# Patient Record
Sex: Male | Born: 1945 | Race: White | Hispanic: No | Marital: Married | Smoking: Never smoker
Health system: Southern US, Community
[De-identification: ages and names within clinical notes are randomized; demographics above are authoritative.]

## PROBLEM LIST (undated history)

## (undated) DIAGNOSIS — H269 Unspecified cataract: Secondary | ICD-10-CM

## (undated) DIAGNOSIS — I351 Nonrheumatic aortic (valve) insufficiency: Secondary | ICD-10-CM

## (undated) DIAGNOSIS — K219 Gastro-esophageal reflux disease without esophagitis: Secondary | ICD-10-CM

## (undated) DIAGNOSIS — M1990 Unspecified osteoarthritis, unspecified site: Secondary | ICD-10-CM

## (undated) DIAGNOSIS — J45909 Unspecified asthma, uncomplicated: Secondary | ICD-10-CM

## (undated) DIAGNOSIS — Z87442 Personal history of urinary calculi: Secondary | ICD-10-CM

## (undated) DIAGNOSIS — I480 Paroxysmal atrial fibrillation: Secondary | ICD-10-CM

## (undated) DIAGNOSIS — I10 Essential (primary) hypertension: Secondary | ICD-10-CM

## (undated) DIAGNOSIS — H6121 Impacted cerumen, right ear: Secondary | ICD-10-CM

## (undated) DIAGNOSIS — I4892 Unspecified atrial flutter: Principal | ICD-10-CM

## (undated) DIAGNOSIS — K5090 Crohn's disease, unspecified, without complications: Secondary | ICD-10-CM

## (undated) DIAGNOSIS — T7840XA Allergy, unspecified, initial encounter: Secondary | ICD-10-CM

## (undated) DIAGNOSIS — I38 Endocarditis, valve unspecified: Secondary | ICD-10-CM

## (undated) DIAGNOSIS — G459 Transient cerebral ischemic attack, unspecified: Secondary | ICD-10-CM

## (undated) DIAGNOSIS — E7800 Pure hypercholesterolemia, unspecified: Secondary | ICD-10-CM

## (undated) DIAGNOSIS — E237 Disorder of pituitary gland, unspecified: Secondary | ICD-10-CM

## (undated) HISTORY — PX: KNEE SURGERY: SHX244

## (undated) HISTORY — DX: Allergy, unspecified, initial encounter: T78.40XA

## (undated) HISTORY — DX: Pure hypercholesterolemia, unspecified: E78.00

## (undated) HISTORY — DX: Essential (primary) hypertension: I10

## (undated) HISTORY — PX: CATARACT EXTRACTION: SUR2

## (undated) HISTORY — DX: Paroxysmal atrial fibrillation: I48.0

## (undated) HISTORY — PX: LITHOTRIPSY: SUR834

## (undated) HISTORY — DX: Unspecified cataract: H26.9

## (undated) HISTORY — PX: TONSILLECTOMY: SUR1361

## (undated) HISTORY — PX: EYE SURGERY: SHX253

## (undated) HISTORY — DX: Crohn's disease, unspecified, without complications: K50.90

## (undated) HISTORY — DX: Impacted cerumen, right ear: H61.21

## (undated) HISTORY — DX: Gastro-esophageal reflux disease without esophagitis: K21.9

---

## 1998-09-17 HISTORY — PX: ROTATOR CUFF REPAIR: SHX139

## 1998-10-18 ENCOUNTER — Ambulatory Visit (HOSPITAL_COMMUNITY): Admission: RE | Admit: 1998-10-18 | Discharge: 1998-10-18 | Payer: Self-pay | Admitting: Emergency Medicine

## 1998-10-19 ENCOUNTER — Encounter: Payer: Self-pay | Admitting: Surgery

## 1998-10-19 ENCOUNTER — Ambulatory Visit (HOSPITAL_BASED_OUTPATIENT_CLINIC_OR_DEPARTMENT_OTHER): Admission: RE | Admit: 1998-10-19 | Discharge: 1998-10-19 | Payer: Self-pay | Admitting: Surgery

## 2002-05-22 ENCOUNTER — Encounter: Payer: Self-pay | Admitting: Urology

## 2002-05-22 ENCOUNTER — Encounter: Admission: RE | Admit: 2002-05-22 | Discharge: 2002-05-22 | Payer: Self-pay | Admitting: Urology

## 2003-09-18 HISTORY — PX: HERNIA REPAIR: SHX51

## 2005-09-17 LAB — HM COLONOSCOPY

## 2006-02-20 ENCOUNTER — Inpatient Hospital Stay (HOSPITAL_COMMUNITY): Admission: AD | Admit: 2006-02-20 | Discharge: 2006-02-20 | Payer: Self-pay | Admitting: Cardiology

## 2007-09-01 ENCOUNTER — Encounter: Admission: RE | Admit: 2007-09-01 | Discharge: 2007-09-01 | Payer: Self-pay | Admitting: Cardiology

## 2008-12-08 ENCOUNTER — Encounter: Admission: RE | Admit: 2008-12-08 | Discharge: 2008-12-08 | Payer: Self-pay | Admitting: Cardiology

## 2008-12-29 ENCOUNTER — Encounter (INDEPENDENT_AMBULATORY_CARE_PROVIDER_SITE_OTHER): Payer: Self-pay | Admitting: Interventional Radiology

## 2008-12-29 ENCOUNTER — Encounter: Admission: RE | Admit: 2008-12-29 | Discharge: 2008-12-29 | Payer: Self-pay | Admitting: Otolaryngology

## 2008-12-29 ENCOUNTER — Other Ambulatory Visit: Admission: RE | Admit: 2008-12-29 | Discharge: 2008-12-29 | Payer: Self-pay | Admitting: Interventional Radiology

## 2009-09-21 ENCOUNTER — Encounter: Admission: RE | Admit: 2009-09-21 | Discharge: 2009-09-21 | Payer: Self-pay | Admitting: Cardiology

## 2009-09-28 ENCOUNTER — Encounter: Admission: RE | Admit: 2009-09-28 | Discharge: 2009-09-28 | Payer: Self-pay | Admitting: Cardiology

## 2009-09-29 ENCOUNTER — Encounter: Admission: RE | Admit: 2009-09-29 | Discharge: 2009-09-29 | Payer: Self-pay | Admitting: Cardiology

## 2009-10-03 ENCOUNTER — Encounter: Admission: RE | Admit: 2009-10-03 | Discharge: 2009-10-03 | Payer: Self-pay | Admitting: Cardiology

## 2010-04-24 ENCOUNTER — Ambulatory Visit: Payer: Self-pay | Admitting: Cardiology

## 2010-05-12 ENCOUNTER — Ambulatory Visit: Payer: Self-pay | Admitting: Cardiology

## 2010-05-25 ENCOUNTER — Ambulatory Visit: Payer: Self-pay | Admitting: Cardiology

## 2010-07-07 ENCOUNTER — Ambulatory Visit: Payer: Self-pay | Admitting: Cardiology

## 2010-07-31 ENCOUNTER — Ambulatory Visit: Payer: Self-pay | Admitting: Cardiology

## 2010-09-07 ENCOUNTER — Ambulatory Visit: Payer: Self-pay | Admitting: Cardiology

## 2010-10-10 ENCOUNTER — Ambulatory Visit: Payer: Self-pay | Admitting: Cardiology

## 2010-10-12 ENCOUNTER — Encounter: Payer: Self-pay | Admitting: Cardiology

## 2010-10-12 DIAGNOSIS — I351 Nonrheumatic aortic (valve) insufficiency: Secondary | ICD-10-CM | POA: Insufficient documentation

## 2010-10-12 DIAGNOSIS — I480 Paroxysmal atrial fibrillation: Secondary | ICD-10-CM | POA: Insufficient documentation

## 2010-10-12 DIAGNOSIS — R011 Cardiac murmur, unspecified: Secondary | ICD-10-CM | POA: Insufficient documentation

## 2010-10-12 DIAGNOSIS — I10 Essential (primary) hypertension: Secondary | ICD-10-CM | POA: Insufficient documentation

## 2010-10-12 DIAGNOSIS — E7800 Pure hypercholesterolemia, unspecified: Secondary | ICD-10-CM | POA: Insufficient documentation

## 2010-11-09 ENCOUNTER — Encounter (INDEPENDENT_AMBULATORY_CARE_PROVIDER_SITE_OTHER): Payer: 59

## 2010-11-09 DIAGNOSIS — Z7901 Long term (current) use of anticoagulants: Secondary | ICD-10-CM

## 2010-11-09 DIAGNOSIS — I4891 Unspecified atrial fibrillation: Secondary | ICD-10-CM

## 2010-12-07 ENCOUNTER — Encounter: Payer: Self-pay | Admitting: Cardiology

## 2010-12-07 ENCOUNTER — Ambulatory Visit (INDEPENDENT_AMBULATORY_CARE_PROVIDER_SITE_OTHER): Payer: 59 | Admitting: Cardiology

## 2010-12-07 DIAGNOSIS — Z79899 Other long term (current) drug therapy: Secondary | ICD-10-CM

## 2010-12-07 DIAGNOSIS — I480 Paroxysmal atrial fibrillation: Secondary | ICD-10-CM

## 2010-12-07 DIAGNOSIS — I10 Essential (primary) hypertension: Secondary | ICD-10-CM

## 2010-12-07 DIAGNOSIS — I4891 Unspecified atrial fibrillation: Secondary | ICD-10-CM

## 2010-12-07 DIAGNOSIS — E7800 Pure hypercholesterolemia, unspecified: Secondary | ICD-10-CM

## 2010-12-07 NOTE — Progress Notes (Signed)
HPI: Had atrial fib 1 month ago, lasted one day.  Had dyspnea with atrial fib. More anxioous recently.  Gettting injections in knee so off warfarin.  Last injection was yesterday.His INR is 1.2 today because he has been off his Coumadin recently.  He will now be resuming his usual dose his weight is up because his activity has been decreased because of his knee problem.  After his series of 5 knee injections his knee has done better.  Current Outpatient Prescriptions  Medication Sig Dispense Refill  . ALPRAZolam (XANAX) 0.25 MG tablet Take 0.25 mg by mouth at bedtime as needed.        [name] aspirin 81 MG tablet Take 81 mg by mouth daily.        . Cholecalciferol (VITAMIN D) 2000 UNIT CAPS Take by mouth daily.        . digoxin (LANOXIN) 0.125 MG tablet Take 125 mcg by mouth daily.        . Glucosamine 500 MG TABS Take 2,000 mg by mouth daily.       [name] losartan-hydrochlorothiazide (HYZAAR) 100-12.5 MG per tablet Take 1 tablet by mouth daily.        [name] lovastatin (MEVACOR) 40 MG tablet Take 40 mg by mouth daily.        . metoprolol (TOPROL-XL) 50 MG 24 hr tablet Take 50 mg by mouth 2 (two) times daily. 1/2 IN THE AM, 1/2 IN THE PM       . MULTIPLE VITAMIN PO Take by mouth daily.        [name] warfarin (COUMADIN) 5 MG tablet Take 5 mg by mouth daily. As directed       . Naproxen Sodium (ALEVE PO) Take by mouth as needed.        . Ranitidine HCl (ZANTAC PO) Take by mouth as needed.          Allergies  Allergen Reactions  . Amiodarone     Abn  thyroid  . Lisinopril Cough  . Tavist Allergy (Clemastine Fumarate)     A fib worse    Patient Active Problem List  Diagnoses  . Paroxysmal atrial fibrillation  . Hypercholesteremia  . Hypertension  . Aortic insufficiency  . Systolic murmur    History  Smoking status  . Never Smoker   Smokeless tobacco  . Not on file    History  Alcohol Use No    History reviewed. No pertinent family history.  Review of Systems: The patient denies any heat or  cold intolerance.  No weight gain or weight loss.  The patient denies headaches or blurry vision.  There is no cough or sputum production.  The patient denies dizziness.  There is no hematuria or hematochezia.  The patient denies any muscle aches or arthritis.  The patient denies any rash.  The patient denies frequent falling or instability.  There is no history of depression or anxiety.  All other systems were reviewed and are negative.   Physical Exam: Vital signs as recorded.Pupils equal and reactive.   Extraocular Movements are full.  There is no scleral icterus.  The mouth and pharynx are normal.  The neck is supple.  The carotids reveal no bruits.  The jugular venous pressure is normal.  The thyroid is not enlarged.  There is no lymphadenopathy.The chest is clear to percussion and auscultation. There are no rales or rhonchi. Expansion of the chest is symmetrical. The precordium is quiet.  The first heart sound is normal.  The second heart sound is physiologically split.  There is no  gallop rub or click.  There is no abnormal lift or heave.There is a soft systolic ejection murmur at the base.The abdomen is soft and nontender. Bowel sounds are normal. The liver and spleen are not enlarged. There Are no abdominal masses. There are no bruits.The pedal pulses are good.  There is no phlebitis or edema.  There is no cyanosis or clubbing.Strength is normal and symmetrical in all extremities.  There is no lateralizing weakness.  There are no sensory deficits.   Assessment / Plan:

## 2010-12-07 NOTE — Assessment & Plan Note (Signed)
The patient is doing well on his present medication.  Continue present dose of losartan HCT and Toprol-XL

## 2010-12-07 NOTE — Assessment & Plan Note (Signed)
Last episode of atrial fibrillation was about a month ago.  He will continue same dose of beta blocker and other meds.

## 2010-12-07 NOTE — Assessment & Plan Note (Signed)
Continued low cholesterol diet.  Needs to work harder on weight loss.

## 2010-12-21 ENCOUNTER — Other Ambulatory Visit: Payer: Self-pay | Admitting: Cardiology

## 2010-12-21 MED ORDER — ALPRAZOLAM 0.25 MG PO TABS: 0.2500 mg | ORAL_TABLET | Freq: Two times a day (BID) | ORAL | Status: DC | PRN

## 2010-12-21 NOTE — Telephone Encounter (Signed)
Needs Alprazolam .25 #60 ( Taking BID now) sent to [facility] [address].

## 2010-12-21 NOTE — Telephone Encounter (Signed)
Patient requested refill

## 2010-12-25 ENCOUNTER — Ambulatory Visit (INDEPENDENT_AMBULATORY_CARE_PROVIDER_SITE_OTHER): Payer: 59 | Admitting: Family Medicine

## 2010-12-25 ENCOUNTER — Encounter: Payer: Self-pay | Admitting: Family Medicine

## 2010-12-25 VITALS — BP 124/74 | HR 72 | Temp 98.0°F | Resp 12 | Ht 72.25 in | Wt 185.0 lb

## 2010-12-25 DIAGNOSIS — Z0000 Encounter for general adult medical examination without abnormal findings: Secondary | ICD-10-CM

## 2010-12-25 LAB — CBC WITH DIFFERENTIAL/PLATELET
Basophils Absolute: 0 10*3/uL (ref 0.0–0.1)
Basophils Relative: 0.5 % (ref 0.0–3.0)
Eosinophils Absolute: 0.1 10*3/uL (ref 0.0–0.7)
Eosinophils Relative: 1.1 % (ref 0.0–5.0)
HCT: 49.9 % (ref 39.0–52.0)
Hemoglobin: 17 g/dL (ref 13.0–17.0)
Lymphocytes Relative: 15.5 % (ref 12.0–46.0)
Lymphs Abs: 1.4 10*3/uL (ref 0.7–4.0)
MCHC: 34.2 g/dL (ref 30.0–36.0)
MCV: 85.1 fl (ref 78.0–100.0)
Monocytes Absolute: 0.7 10*3/uL (ref 0.1–1.0)
Monocytes Relative: 7.4 % (ref 3.0–12.0)
Neutro Abs: 7 10*3/uL (ref 1.4–7.7)
Neutrophils Relative %: 75.5 % (ref 43.0–77.0)
Platelets: 205 10*3/uL (ref 150.0–400.0)
RBC: 5.86 Mil/uL — ABNORMAL HIGH (ref 4.22–5.81)
RDW: 13.8 % (ref 11.5–14.6)
WBC: 9.2 10*3/uL (ref 4.5–10.5)

## 2010-12-25 LAB — HEPATIC FUNCTION PANEL
Albumin: 4.1 g/dL (ref 3.5–5.2)
Alkaline Phosphatase: 71 U/L (ref 39–117)
Total Bilirubin: 0.7 mg/dL (ref 0.3–1.2)

## 2010-12-25 LAB — BASIC METABOLIC PANEL
CO2: 30 mEq/L (ref 19–32)
Calcium: 9.3 mg/dL (ref 8.4–10.5)
Chloride: 98 mEq/L (ref 96–112)
Creatinine, Ser: 0.8 mg/dL (ref 0.4–1.5)
GFR: 100.62 mL/min (ref >60.00)

## 2010-12-25 LAB — PSA: PSA: 1.48 ng/mL (ref 0.10–4.00)

## 2010-12-25 LAB — LIPID PANEL: HDL: 40.6 mg/dL (ref >39.00)

## 2010-12-25 NOTE — Patient Instructions (Signed)
Consider Yearly complete physical exam.

## 2010-12-25 NOTE — Progress Notes (Signed)
  Subjective:    Patient ID: Francis Campbell, male    DOB: 1945/09/19, 65 y.o.   MRN: [id number]  HPI New patient to establish care. Past medical history reviewed. He has history of paroxysmal atrial fibrillation, hyperlipidemia, hypertension, and prior history of kidney stones. He continues to see urologist and cardiologist regularly. He is on Coumadin and is followed in cardiology clinic. Medications reviewed. Surgical hx reviewed and as recorded elsewhere.  Tetanus less than 10 years ago. Pneumovax last year. Received shingles vaccine within the past year as well. Exercises with walking. Previous colonoscopy less than 10 years ago but unsure of exact date.  Family history significant for father  with some type of leukemia. Mother had history of stroke.   Review of Systems  Constitutional: Negative for fever, activity change, appetite change and fatigue.  HENT: Negative for ear pain, congestion and trouble swallowing.   Eyes: Negative for pain and visual disturbance.  Respiratory: Negative for cough, shortness of breath and wheezing.   Cardiovascular: Negative for chest pain and palpitations.  Gastrointestinal: Negative for nausea, vomiting, abdominal pain, diarrhea, constipation, blood in stool, abdominal distention and rectal pain.  Genitourinary: Negative for dysuria, hematuria and testicular pain.  Musculoskeletal: Negative for joint swelling and arthralgias.  Skin: Negative for rash.  Neurological: Negative for dizziness, syncope and headaches.  Hematological: Negative for adenopathy.  Psychiatric/Behavioral: Negative for confusion and dysphoric mood.       Objective:   Physical Exam  Constitutional: He is oriented to person, place, and time. He appears well-developed and well-nourished. No distress.  HENT:  Head: Normocephalic and atraumatic.  Right Ear: External ear normal.  Left Ear: External ear normal.  Mouth/Throat: Oropharynx is clear and moist.  Eyes: Conjunctivae  and EOM are normal. Pupils are equal, round, and reactive to light.  Neck: Normal range of motion. Neck supple. No thyromegaly present.  Cardiovascular: Normal rate, regular rhythm and normal heart sounds.   No murmur heard. Pulmonary/Chest: No respiratory distress. He has no wheezes. He has no rales.  Abdominal: Soft. Bowel sounds are normal. He exhibits no distension and no mass. There is no tenderness. There is no rebound and no guarding.  Genitourinary:       Prostate deferred as patient sees urologist  Musculoskeletal: He exhibits no edema.  Lymphadenopathy:    He has no cervical adenopathy.  Neurological: He is alert and oriented to person, place, and time. He displays normal reflexes. No cranial nerve deficit.  Skin: No rash noted.  Psychiatric: He has a normal mood and affect. His behavior is normal. Judgment and thought content normal.          Assessment & Plan:  #1 health maintenance exam. Immunizations up to date. Will confirm date of last colonoscopy. Obtain screening lab work. He'll continue followup with Coumadin clinic with cardiologist

## 2010-12-27 NOTE — Progress Notes (Signed)
Quick Note:  Pt informed on home VM ______ 

## 2010-12-28 ENCOUNTER — Telehealth: Payer: Self-pay | Admitting: *Deleted

## 2010-12-28 NOTE — Telephone Encounter (Signed)
Pt requested labs be faxed, done, confirmation received

## 2010-12-28 NOTE — Telephone Encounter (Signed)
Per notes, already left on home machine.

## 2010-12-28 NOTE — Telephone Encounter (Signed)
Pt. Needs lab results, please.

## 2011-01-08 ENCOUNTER — Encounter: Payer: 59 | Admitting: *Deleted

## 2011-01-09 ENCOUNTER — Ambulatory Visit (INDEPENDENT_AMBULATORY_CARE_PROVIDER_SITE_OTHER): Payer: 59 | Admitting: *Deleted

## 2011-01-09 DIAGNOSIS — Z7901 Long term (current) use of anticoagulants: Secondary | ICD-10-CM

## 2011-01-09 DIAGNOSIS — I480 Paroxysmal atrial fibrillation: Secondary | ICD-10-CM

## 2011-01-09 DIAGNOSIS — I4891 Unspecified atrial fibrillation: Secondary | ICD-10-CM

## 2011-01-09 LAB — POCT INR: INR: 3.3

## 2011-01-12 ENCOUNTER — Telehealth: Payer: Self-pay | Admitting: Cardiology

## 2011-01-12 ENCOUNTER — Encounter: Payer: Self-pay | Admitting: Family Medicine

## 2011-01-12 ENCOUNTER — Ambulatory Visit (INDEPENDENT_AMBULATORY_CARE_PROVIDER_SITE_OTHER): Payer: 59 | Admitting: Family Medicine

## 2011-01-12 VITALS — BP 130/78 | Temp 98.0°F | Wt 187.0 lb

## 2011-01-12 DIAGNOSIS — R05 Cough: Secondary | ICD-10-CM

## 2011-01-12 MED ORDER — LEVOFLOXACIN 500 MG PO TABS: 500.0000 mg | ORAL_TABLET | Freq: Every day | ORAL | Status: AC

## 2011-01-12 NOTE — Telephone Encounter (Signed)
Spoke with patient and he is going to call his primary care doctor.  Advised to call back if they are not able to see him today.  Patient stated he would.

## 2011-01-12 NOTE — Progress Notes (Signed)
  Subjective:    Patient ID: Francis Campbell, male    DOB: 10-11-1945, 65 y.o.   MRN: [id number]  HPI Patient seen with dry cough for approximately one week. History is that about 2 weeks go he was seen by urologist and diagnosed with acute prostatitis. Started Cipro 500 mg twice daily for 30 days. He denied any nasal congestive symptoms. Possibly some mild dyspnea. Increased fatigue. Cough is dry. No fever or chills. Denies nausea or vomiting. No pleuritic pain. No hemoptysis. He is nonsmoker. Felt somewhat similar previously with pneumonia   Review of Systems  Constitutional: Positive for fatigue. Negative for fever and chills.  Respiratory: Positive for cough. Negative for choking and wheezing.   Cardiovascular: Negative for chest pain, palpitations and leg swelling.  Gastrointestinal: Negative for abdominal pain.  Neurological: Negative for dizziness and headaches.       Objective:   Physical Exam  Constitutional: He is oriented to person, place, and time. He appears well-developed and well-nourished.  Neck: Neck supple.  Cardiovascular: Normal rate, regular rhythm and normal heart sounds.   No murmur heard. Pulmonary/Chest: Effort normal and breath sounds normal. No respiratory distress. He has no wheezes. He has no rales. He exhibits no tenderness.  Musculoskeletal: He exhibits no edema.  Lymphadenopathy:    He has no cervical adenopathy.  Neurological: He is alert and oriented to person, place, and time. No cranial nerve deficit.          Assessment & Plan:  Cough probably related to viral bronchitis. He does not have rales or asymmetric breath sounds or any fever to suggest pneumonia this time. If he develops any fever or worsening cough and we can discontinue Cipro start Levaquin for better strep pneumoniae coverage otherwise continue with his Cipro and observe for now

## 2011-01-12 NOTE — Telephone Encounter (Signed)
PT THINKS HE MAY HAVE PNEUMONIA AGAIN. PLACED CHART IN BOX.

## 2011-01-12 NOTE — Patient Instructions (Signed)
If you develop any fever and or worsening cough stop Cipro and start Levoquin.

## 2011-01-24 ENCOUNTER — Ambulatory Visit: Payer: Self-pay | Admitting: Family Medicine

## 2011-02-02 NOTE — H&P (Signed)
NAME:  Francis Campbell, Francis Campbell                ACCOUNT NO.:  [id number]   MEDICAL RECORD NO.:  [id number]          PATIENT TYPE:  INP   LOCATION:  3703                         FACILITY:  [facility]   PHYSICIAN:  Darlin Coco, M.D. DATE OF BIRTH:  Jan 14, 1946   DATE OF ADMISSION:  02/20/2006  DATE OF DISCHARGE:                                HISTORY & PHYSICAL   CHIEF COMPLAINT:  Rapid heart action.   HISTORY:  This is a 65 year old married gentleman admitted with atrial  fibrillation with a rapid ventricular response.  He has had a past history  of paroxysmal atrial fibrillation usually lasting less than 24 hours.  This  present episode began three days ago and has not subsided.  He has not been  able to work because he has been weak and dizzy.  Also had some chest  burning.  He has a past history of known valvular heart disease.  His last  echocardiogram Feb 05, 2006, had shown a bicuspid aortic valve with moderate  aortic insufficiency and mild LVH with normal left ventricular systolic  function and with pseudonormalization diastolic dysfunction and normal  pulmonary artery pressure.   PRESENT MEDICATIONS:  1.  Lanoxin 0.25 mg daily.  2.  Corgard generic 80 mg daily.  3.  Ecotrin 81 mg daily.  4.  Lipitor 20 mg daily.  5.  Multivitamin daily.   ALLERGIES:  The patient is allergic to TAVIST D and he does not tolerate  MUCINEX well.   FAMILY HISTORY:  His mother has a history of palpitations and father has a  history of irregular heartbeat.   SOCIAL HISTORY:  He is married.  He has two children in good health.  He  works at [employer] on [employer] side.  He does not use alcohol or  tobacco.  He drinks very little, if any, caffeine and he tries to avoid  chocolate.   PAST SURGICAL HISTORY:  He has had no significant surgery except for  laparoscopic bilateral inguinal hernia repairs by Dr. Johnathan Hausen in  October 19, 1998.   REVIEW OF SYSTEMS:  Otherwise unremarkable.  He is not  having any change in  bowel habits, hematochezia or melena.  He is not having any dysuria.  He  does have some occasional low back pain secondary to musculoskeletal strain.   Remainder of review of systems is negative in detail.  He does not have any  history of diabetes or hyperthyroidism.   PHYSICAL EXAMINATION:  VITAL SIGNS:  His weight is 189, down 4 pounds, blood  pressure is 95/70, pulse is 120 and irregularly irregular.  Color is good.  SKIN:  Warm and dry.  HEENT: Pupils equal and reactive.  Fundi normal.  Extraocular movements are  full.  Sclerae are clear.  CARDIOVASCULAR:  The heart reveals no gallop, and there is a soft systolic  ejection murmur at the left sternal edge.  LUNGS:  The lungs are clear.  NECK:  The carotids have normal upstroke.  ABDOMEN:  The abdomen is soft and nontender.  Liver and spleen not enlarged.  EXTREMITIES:  Show no phlebitis or edema.   IMPRESSION:  1.  Recent onset of atrial fibrillation with rapid ventricular response.  2.  Dyspnea secondary to #1.  3.  History of aortic valve disease with aortic insufficiency by echo.   DISPOSITION:  Were going to admit to [place]. [facility]  Telemetry.  We will treat with IV amiodarone and with Lovenox and start  Coumadin.           ______________________________  Darlin Coco, M.D.     TB/MEDQ  D:  02/20/2006  T:  02/20/2006  Job:  [id number]

## 2011-02-06 ENCOUNTER — Ambulatory Visit (INDEPENDENT_AMBULATORY_CARE_PROVIDER_SITE_OTHER): Payer: 59 | Admitting: *Deleted

## 2011-02-06 DIAGNOSIS — I480 Paroxysmal atrial fibrillation: Secondary | ICD-10-CM

## 2011-02-06 DIAGNOSIS — I4891 Unspecified atrial fibrillation: Secondary | ICD-10-CM

## 2011-02-06 DIAGNOSIS — Z7901 Long term (current) use of anticoagulants: Secondary | ICD-10-CM

## 2011-02-06 LAB — POCT INR: INR: 1.6

## 2011-02-21 ENCOUNTER — Ambulatory Visit (INDEPENDENT_AMBULATORY_CARE_PROVIDER_SITE_OTHER): Payer: 59 | Admitting: *Deleted

## 2011-02-21 DIAGNOSIS — Z7901 Long term (current) use of anticoagulants: Secondary | ICD-10-CM

## 2011-02-21 DIAGNOSIS — I480 Paroxysmal atrial fibrillation: Secondary | ICD-10-CM

## 2011-02-21 DIAGNOSIS — I4891 Unspecified atrial fibrillation: Secondary | ICD-10-CM

## 2011-02-28 ENCOUNTER — Encounter: Payer: Self-pay | Admitting: Cardiology

## 2011-03-07 ENCOUNTER — Ambulatory Visit (INDEPENDENT_AMBULATORY_CARE_PROVIDER_SITE_OTHER): Payer: 59 | Admitting: *Deleted

## 2011-03-07 DIAGNOSIS — Z7901 Long term (current) use of anticoagulants: Secondary | ICD-10-CM

## 2011-03-07 DIAGNOSIS — I4891 Unspecified atrial fibrillation: Secondary | ICD-10-CM

## 2011-03-07 DIAGNOSIS — I480 Paroxysmal atrial fibrillation: Secondary | ICD-10-CM

## 2011-03-07 LAB — POCT INR: INR: 2.2

## 2011-03-16 ENCOUNTER — Other Ambulatory Visit: Payer: Self-pay | Admitting: *Deleted

## 2011-03-16 DIAGNOSIS — I10 Essential (primary) hypertension: Secondary | ICD-10-CM

## 2011-03-16 MED ORDER — LOSARTAN POTASSIUM-HCTZ 100-12.5 MG PO TABS: 1.0000 | ORAL_TABLET | Freq: Every day | ORAL | Status: DC

## 2011-03-16 NOTE — Telephone Encounter (Signed)
Refilled Rx. forLosartan

## 2011-03-23 ENCOUNTER — Encounter: Payer: Self-pay | Admitting: Cardiology

## 2011-03-28 ENCOUNTER — Encounter: Payer: Self-pay | Admitting: Cardiology

## 2011-03-28 ENCOUNTER — Ambulatory Visit (INDEPENDENT_AMBULATORY_CARE_PROVIDER_SITE_OTHER): Payer: 59 | Admitting: *Deleted

## 2011-03-28 ENCOUNTER — Ambulatory Visit (INDEPENDENT_AMBULATORY_CARE_PROVIDER_SITE_OTHER): Payer: 59 | Admitting: Cardiology

## 2011-03-28 DIAGNOSIS — I4891 Unspecified atrial fibrillation: Secondary | ICD-10-CM

## 2011-03-28 DIAGNOSIS — I10 Essential (primary) hypertension: Secondary | ICD-10-CM

## 2011-03-28 DIAGNOSIS — E7800 Pure hypercholesterolemia, unspecified: Secondary | ICD-10-CM

## 2011-03-28 DIAGNOSIS — I480 Paroxysmal atrial fibrillation: Secondary | ICD-10-CM

## 2011-03-28 DIAGNOSIS — Z7901 Long term (current) use of anticoagulants: Secondary | ICD-10-CM

## 2011-03-28 NOTE — Assessment & Plan Note (Signed)
Since last visit the patient has had just one episode of atrial fibrillation which lasted only a few hours and subsided without special therapy.  He's been feeling well.  No chest pain or shortness of breath.

## 2011-03-28 NOTE — Assessment & Plan Note (Signed)
Patient has a past history of hypercholesterolemia.He is on lovastatin.  He is not having any myalgias from his statin therapy.  His weight is down 1 pound since last visit.  He is trying to stay on a low-cholesterol diet.

## 2011-03-28 NOTE — Assessment & Plan Note (Signed)
Patient has not been having any headaches or dizzy spells.  His had no syncope.  His energy level has been good.

## 2011-03-28 NOTE — Progress Notes (Signed)
Francis Campbell Date of Birth:  1946/08/05 [facility] [address] [place], [place]  [address] [phone]           Fax   [phone]  HPI: This pleasant 65 year old gentleman is seen for a four-month followup office visit.  He has a history of paroxysmal atrial fibrillation.  He's had a history of bicuspid aortic valve with moderate aortic insufficiency.  His last echocardiogram was 04/15/09 at which time he had an ejection fraction of 55-60% and impaired relaxation and probable bicuspid valve with mild aortic stenosis and moderate aortic insufficiency and normal pulmonary artery pressure.  The patient does not have any history of ischemic heart disease.  Had a normal Stress Cardiolite nuclear stress test on 07/25/07.  He is on long-term Coumadin.  He's not had any problems from his Coumadin in terms of bleeding.  Current Outpatient Prescriptions  Medication Sig Dispense Refill  . aspirin 81 MG tablet Take 81 mg by mouth daily.        . Cholecalciferol (VITAMIN D) 2000 UNIT CAPS Take by mouth daily.        . ciprofloxacin (CIPRO) 500 MG tablet Take 500 mg by mouth 2 (two) times daily. As needed      . digoxin (LANOXIN) 0.125 MG tablet Take 125 mcg by mouth daily.        [name] losartan-hydrochlorothiazide (HYZAAR) 100-12.5 MG per tablet Take 1 tablet by mouth daily.  90 tablet  3  . lovastatin (MEVACOR) 40 MG tablet Take 40 mg by mouth daily.        . metoprolol (TOPROL-XL) 50 MG 24 hr tablet Take 50 mg by mouth. 1 IN THE AM, 1/2 IN THE PM      . MULTIPLE VITAMIN PO Take by mouth daily.        . Naproxen Sodium (ALEVE PO) Take by mouth as needed.        . Ranitidine HCl (ZANTAC PO) Take by mouth as needed.        . warfarin (COUMADIN) 5 MG tablet Take 5 mg by mouth daily. As directed         Allergies  Allergen Reactions  . Amiodarone     Abn  thyroid  . Lisinopril Cough  . Tavist Allergy (Clemastine Fumarate)     A fib worse    Patient  Active Problem List  Diagnoses  . Paroxysmal atrial fibrillation  . Hypercholesteremia  . Hypertension  . Aortic insufficiency  . Systolic murmur    History  Smoking status  . Never Smoker   Smokeless tobacco  . Not on file    History  Alcohol Use No    Family History  Problem Relation Age of Onset  . Stroke Mother   . Hypertension Mother   . Kidney disease Mother     stones  . Kidney disease Brother     stones  . Arthritis Paternal Grandmother   . Heart disease Paternal Grandmother     Review of Systems: The patient denies any heat or cold intolerance.  No weight gain or weight loss.  The patient denies headaches or blurry vision.  There is no cough or sputum production.  The patient denies dizziness.  There is no hematuria or hematochezia.  The patient denies any muscle aches or arthritis.  The patient denies any rash.  The patient denies frequent falling or instability.  There is no history of depression or anxiety.  All other systems were reviewed and are negative.The patient has a history of arthritis of his right knee and his knee pain has improved after 5 injections of Synvisc by Dr. Theda Sers.  Patient also has a history of prostatitis and is followed by Dr. Jeffie Pollock.  He had an elevated PSA which was felt to be secondary to the prostatitis.   Physical Exam: Filed Vitals:   03/28/11 1101  BP: 120/74  Pulse: 72  The general appearance reveals a well-developed well-nourished gentleman in no distress.Pupils equal and reactive.   Extraocular Movements are full.  There is no scleral icterus.  The mouth and pharynx are normal.  The neck is supple.  The carotids reveal no bruits.  The jugular venous pressure is normal.  The thyroid is not enlarged.  There is no lymphadenopathy.The chest is clear to percussion and auscultation. There are no rales or rhonchi. Expansion of the chest is symmetrical.The heart reveals a grade 2/6 systolic ejection murmur at the base.  No diastolic  murmur no gallop or rub.The abdomen is soft and nontender. Bowel sounds are normal. The liver and spleen are not enlarged. There Are no abdominal masses. There are no bruits.The pedal pulses are good.  There is no phlebitis or edema.  There is no cyanosis or clubbing.Strength is normal and symmetrical in all extremities.  There is no lateralizing weakness.  There are no sensory deficits.The skin is warm and dry.  There is no rash.    Assessment / Plan: Continue same medication.  Recheck in 4 months for followup office visit and fasting lab work.  In September or early October he and his wife will go on he two-week trip out [place] including the [facility]

## 2011-04-09 ENCOUNTER — Other Ambulatory Visit: Payer: Self-pay | Admitting: *Deleted

## 2011-04-09 DIAGNOSIS — E785 Hyperlipidemia, unspecified: Secondary | ICD-10-CM

## 2011-04-09 MED ORDER — LOVASTATIN 40 MG PO TABS: 40.0000 mg | ORAL_TABLET | Freq: Every day | ORAL | Status: DC

## 2011-04-09 NOTE — Telephone Encounter (Signed)
Refilled meds per fax request.  

## 2011-04-25 ENCOUNTER — Ambulatory Visit (INDEPENDENT_AMBULATORY_CARE_PROVIDER_SITE_OTHER): Payer: 59 | Admitting: *Deleted

## 2011-04-25 DIAGNOSIS — I480 Paroxysmal atrial fibrillation: Secondary | ICD-10-CM

## 2011-04-25 DIAGNOSIS — I4891 Unspecified atrial fibrillation: Secondary | ICD-10-CM

## 2011-04-25 DIAGNOSIS — Z7901 Long term (current) use of anticoagulants: Secondary | ICD-10-CM

## 2011-04-25 LAB — POCT INR: INR: 2.5

## 2011-05-23 ENCOUNTER — Ambulatory Visit (INDEPENDENT_AMBULATORY_CARE_PROVIDER_SITE_OTHER): Payer: 59 | Admitting: *Deleted

## 2011-05-23 DIAGNOSIS — I480 Paroxysmal atrial fibrillation: Secondary | ICD-10-CM

## 2011-05-23 DIAGNOSIS — I4891 Unspecified atrial fibrillation: Secondary | ICD-10-CM

## 2011-05-23 DIAGNOSIS — Z7901 Long term (current) use of anticoagulants: Secondary | ICD-10-CM

## 2011-06-06 ENCOUNTER — Encounter: Payer: 59 | Admitting: *Deleted

## 2011-06-28 ENCOUNTER — Ambulatory Visit (INDEPENDENT_AMBULATORY_CARE_PROVIDER_SITE_OTHER): Payer: 59 | Admitting: Family Medicine

## 2011-06-28 ENCOUNTER — Encounter: Payer: Self-pay | Admitting: Family Medicine

## 2011-06-28 VITALS — BP 110/64 | Temp 98.1°F | Wt 190.0 lb

## 2011-06-28 DIAGNOSIS — J209 Acute bronchitis, unspecified: Secondary | ICD-10-CM

## 2011-06-28 MED ORDER — HYDROCODONE-HOMATROPINE 5-1.5 MG/5ML PO SYRP: 5.0000 mL | ORAL_SOLUTION | Freq: Four times a day (QID) | ORAL | Status: DC | PRN

## 2011-06-28 MED ORDER — BENZONATATE 200 MG PO CAPS: 200.0000 mg | ORAL_CAPSULE | Freq: Three times a day (TID) | ORAL | Status: AC | PRN

## 2011-06-28 NOTE — Patient Instructions (Signed)
Acute Bronchitis You have acute bronchitis. This means you have a chest cold. The airways in your lungs are inflamed (red and sore). Acute means it is sudden onset. Bronchitis is most often caused by a virus. In smokers, people with chronic lung problems, and elderly patients, treatment with antibiotics for bacterial infection may be needed. Exposure to cigarette smoke or irritating chemicals will make bronchitis worse. Allergies and asthma can also make bronchitis worse. Repeated episodes of bronchitis may cause long standing lung problems. Acute bronchitis is usually treated with rest, fluids, and medicines for relief of fever or cough. Bronchodilator medicines from metered inhalers or a nebulizer may be used to help open up the small airways. This reduces shortness of breath and helps control cough. Antibiotics can be prescribed if you are more seriously ill or at risk. A cool air vaporizer may help thin bronchial secretions and make it easier to clear your chest. Increased fluids may also help. You must avoid smoking, even second hand exposure. If you are a cigarette smoker, consider using nicotine gum or skin patches to help control withdrawal symptoms. Recovery from bronchitis is often slow, but you should start feeling better after 2-3 days. Cough from bronchitis frequently lasts for 3-4 weeks.  SEEK IMMEDIATE MEDICAL CARE IF YOU DEVELOP:  Increased fever, chills, or chest pain.   Severe shortness of breath or bloody sputum.   Dehydration, fainting, repeated vomiting, severe headache.   No improvement after one week of proper treatment.  MAKE SURE YOU:   Understand these instructions.   Will watch your condition.   Will get help right away if you are not doing well or get worse.  Document Released: 10/11/2004 Document Re-Released: 08/16/2008 [facility] Patient Information 2011 [place].

## 2011-06-28 NOTE — Progress Notes (Signed)
  Subjective:    Patient ID: Francis Campbell, male    DOB: [birth year], 65 y.o.   MRN: [id number]  HPI Acute illness. Onset about 5 days ago. Increased malaise and some nasal congestion. Dry cough which is the most bothersome symptom. No relief with over-the-counter medications. Daytime and nighttime cough. Initially had some chills but no definite fever. Denies any nausea, vomiting, or diarrhea. Patient is nonsmoker. Past medical history reviewed as below.  Past Medical History  Diagnosis Date  . Paroxysmal atrial fibrillation   . Hypercholesteremia   . Hypertension   . Aortic insufficiency   . Systolic murmur   . Kidney stone   . Kidney stone 2003   Past Surgical History  Procedure Date  . Arthoscopic rotaor cuff repair   . Tonsillectomy   . Rotator cuff repair 2000  . Knee surgery 1990, 2000  . Hernia repair 2005  . Eye surgery     [place]    reports that he has never smoked. He does not have any smokeless tobacco history on file. He reports that he does not drink alcohol or use illicit drugs. family history includes Arthritis in his paternal grandmother; Heart disease in his paternal grandmother; Hypertension in his mother; Kidney disease in his brother and mother; and Stroke in his mother. Allergies  Allergen Reactions  . Amiodarone     Abn  thyroid  . Lisinopril Cough  . Tavist Allergy (Clemastine Fumarate)     A fib worse      Review of Systems  Constitutional: Positive for fatigue. Negative for fever and chills.  HENT: Positive for congestion.   Respiratory: Positive for cough. Negative for shortness of breath and wheezing.   Gastrointestinal: Negative for nausea and vomiting.       Objective:   Physical Exam  Constitutional: He appears well-developed and well-nourished. No distress.  HENT:  Right Ear: External ear normal.  Left Ear: External ear normal.  Mouth/Throat: Oropharynx is clear and moist.  Neck: Neck supple.  Cardiovascular: Normal  rate and regular rhythm.   Pulmonary/Chest: Effort normal and breath sounds normal. No respiratory distress. He has no wheezes. He has no rales.  Lymphadenopathy:    He has no cervical adenopathy.          Assessment & Plan:  Acute bronchitis. Suspect viral. Tessalon Perles 200 mg every 8 hours as needed for cough. Hycodan cough syrup for nighttime cough if not relieved with Tessalon. Followup promptly for fever or worsening symptoms

## 2011-07-09 ENCOUNTER — Other Ambulatory Visit: Payer: Self-pay | Admitting: Family Medicine

## 2011-07-09 MED ORDER — HYDROCODONE-HOMATROPINE 5-1.5 MG/5ML PO SYRP: 5.0000 mL | ORAL_SOLUTION | Freq: Four times a day (QID) | ORAL | Status: DC | PRN

## 2011-07-09 NOTE — Telephone Encounter (Signed)
May refill once. 

## 2011-07-09 NOTE — Telephone Encounter (Signed)
Rx called in, pt informed on vm

## 2011-07-09 NOTE — Telephone Encounter (Signed)
Last filled 06/28/2011, please advise

## 2011-07-09 NOTE — Telephone Encounter (Signed)
Addended by: Jill Side on: 07/09/2011 05:25 PM   Modules accepted: Orders

## 2011-07-09 NOTE — Telephone Encounter (Signed)
Pt called to check on status of getting refill for Hycodan cough med. Pls call in to [facility] today.

## 2011-07-09 NOTE — Telephone Encounter (Signed)
Duplicate request

## 2011-07-09 NOTE — Telephone Encounter (Signed)
Pt need refill on hycodan cough med [facility] [address] [phone]

## 2011-07-19 ENCOUNTER — Other Ambulatory Visit: Payer: Self-pay | Admitting: Family Medicine

## 2011-07-19 NOTE — Telephone Encounter (Signed)
[facility]. Pt is still coughing. Thanks. Would like a call back.

## 2011-07-19 NOTE — Telephone Encounter (Signed)
Refill Hycodan once and needs follow up/CXR if no better by next week.

## 2011-07-20 MED ORDER — HYDROCODONE-HOMATROPINE 5-1.5 MG/5ML PO SYRP: 5.0000 mL | ORAL_SOLUTION | Freq: Four times a day (QID) | ORAL | Status: DC | PRN

## 2011-07-20 NOTE — Telephone Encounter (Signed)
Pt informed

## 2011-07-20 NOTE — Telephone Encounter (Signed)
Pt called to check on status of refill of Hycodan cough syrup. Pls call in to [facility] on [address]. Pt aware that he will need to sch a fup ov, if not better.

## 2011-07-25 ENCOUNTER — Telehealth: Payer: Self-pay | Admitting: Family Medicine

## 2011-07-25 NOTE — Telephone Encounter (Signed)
Try Mucinex DM.

## 2011-07-25 NOTE — Telephone Encounter (Signed)
Please advise 

## 2011-07-25 NOTE — Telephone Encounter (Signed)
Pt called and has sch ov for 11/21 re: bronchitus. Pt says that he still has cough and chest congestion and that Dr Elease Hashimoto has given pt Hycodan syrup for cough, and pt did not know if he could get a refill to last until his ov and if this cough med contains an expectorant to help loosen phlegm or is than an alternative cough med to use. [facility] [address].

## 2011-07-26 NOTE — Telephone Encounter (Signed)
Pt informed

## 2011-07-30 ENCOUNTER — Encounter: Payer: Self-pay | Admitting: Internal Medicine

## 2011-07-30 ENCOUNTER — Ambulatory Visit (INDEPENDENT_AMBULATORY_CARE_PROVIDER_SITE_OTHER)
Admission: RE | Admit: 2011-07-30 | Discharge: 2011-07-30 | Disposition: A | Discharge status: Home / Self Care | Payer: 59 | Source: Ambulatory Visit | Attending: Internal Medicine | Admitting: Internal Medicine

## 2011-07-30 ENCOUNTER — Ambulatory Visit (INDEPENDENT_AMBULATORY_CARE_PROVIDER_SITE_OTHER): Payer: 59 | Admitting: Internal Medicine

## 2011-07-30 VITALS — BP 110/70 | HR 76 | Temp 97.9°F | Wt 188.0 lb

## 2011-07-30 DIAGNOSIS — R05 Cough: Secondary | ICD-10-CM

## 2011-07-30 DIAGNOSIS — R053 Chronic cough: Secondary | ICD-10-CM | POA: Insufficient documentation

## 2011-07-30 MED ORDER — HYDROCODONE-HOMATROPINE 5-1.5 MG/5ML PO SYRP: 5.0000 mL | ORAL_SOLUTION | Freq: Four times a day (QID) | ORAL | Status: AC | PRN

## 2011-07-30 MED ORDER — PREDNISONE 20 MG PO TABS: ORAL_TABLET | ORAL | Status: AC

## 2011-07-30 NOTE — Progress Notes (Signed)
  Subjective:    Patient ID: Francis Campbell, male    DOB: 07-18-1946, 65 y.o.   MRN: [id number]  HPI Patient comes in today for SDA  Scheduled as a  acute problem evaluation. However the problem appears to not be acute.  He was seen last month for cough  By pcp and felt to have a viral bronchitis. However it has continued and Now has congestion and a cold  Like sx Last week. Cough all the time but if sits still some better .   mucinex dm an1200.  No lozenges  Decrease energy. ? Chills at onset  o fever for 2 weeks.  No one sick at home Using suppressant helps some . Has used a lot and is aware but does help.  NO hx of allergy but does have hx of  Asthma as a child.  No wheeze NO tobacco exposure.   Review of Systems No fever except at beginning   NO cp sob no bleeding change in meds  No edema bleeding rashes Past history family history social history reviewed in the electronic medical record.     Objective:   Physical Exam WDWN in NAD  quiet respirations; mildly congested  somewhat hoarse. Non toxic . HEENT: Normocephalic ;atraumatic , Eyes;  PERRL, EOMs  Full, lids and conjunctiva clear,,Ears: no deformities, canals nl, TM landmarks normal, Nose: no deformity or discharge but congested;face non  tender Mouth : OP clear without lesion or edema . Neck: Supple without adenopathy or masses or bruits Chest:  Clear to A&P without wheezes rales or rhonchi CV:  S1-S2 no gallops  peripheral perfusion is normal Skin :nl perfusion and no acute rashes      Assessment & Plan:  Cough  continued  ? Post infectious  Vs newer uri uncomplicated  Get c xray and rx depending on results. Consider pred for post inf cough  Ok to refill med x 1   Anticoagulation  rx af/ ai stable

## 2011-07-30 NOTE — Patient Instructions (Signed)
Get chest x ray for the reasons discussed and will contact you about  Results and plan. Call if any shortness of breath or fever.

## 2011-07-31 ENCOUNTER — Other Ambulatory Visit: Payer: Self-pay | Admitting: *Deleted

## 2011-07-31 ENCOUNTER — Telehealth: Payer: Self-pay | Admitting: *Deleted

## 2011-07-31 DIAGNOSIS — F419 Anxiety disorder, unspecified: Secondary | ICD-10-CM

## 2011-07-31 MED ORDER — ALPRAZOLAM 0.25 MG PO TABS: 0.2500 mg | ORAL_TABLET | Freq: Two times a day (BID) | ORAL | Status: DC | PRN

## 2011-07-31 NOTE — Telephone Encounter (Signed)
Pt is calling to get xray results.

## 2011-07-31 NOTE — Telephone Encounter (Signed)
Pt aware of results 

## 2011-07-31 NOTE — Telephone Encounter (Signed)
Refilled meds per fax request.  

## 2011-08-08 ENCOUNTER — Encounter: Payer: Self-pay | Admitting: Family Medicine

## 2011-08-08 ENCOUNTER — Ambulatory Visit (INDEPENDENT_AMBULATORY_CARE_PROVIDER_SITE_OTHER): Payer: 59 | Admitting: Family Medicine

## 2011-08-08 VITALS — BP 130/72 | Temp 97.9°F | Resp 12 | Wt 188.0 lb

## 2011-08-08 DIAGNOSIS — R05 Cough: Secondary | ICD-10-CM

## 2011-08-08 DIAGNOSIS — R059 Cough, unspecified: Secondary | ICD-10-CM

## 2011-08-08 MED ORDER — TRAMADOL HCL 50 MG PO TABS: 50.0000 mg | ORAL_TABLET | Freq: Four times a day (QID) | ORAL | Status: AC | PRN

## 2011-08-08 NOTE — Progress Notes (Signed)
  Subjective:    Patient ID: Francis Campbell, male    DOB: [birth year], 65 y.o.   MRN: [id number]  HPI  Persistent cough. Duration about 5 weeks now. May have had some low-grade fever initially. Felt to be viral bronchitis. Was seen last week and chest x-ray showed no acute findings. Has taken Mucinex DM without much relief. Using hydrocodone cough syrup which has helped but we've recommended not continuing on this to avoid dependency. He has no GERD symptoms. No wheezing but again had childhood asthma. No fever, dyspnea, or hemoptysis. No pleuritic pain. Recent trial of prednisone which did not seem to help much. Initially tried Tessalon but this did not help much either. He is not having any significant postnasal drip symptoms.  Review of Systems  Constitutional: Negative for fever, chills, appetite change and unexpected weight change.  HENT: Negative for sore throat, voice change and sinus pressure.   Respiratory: Positive for cough. Negative for shortness of breath and wheezing.   Cardiovascular: Negative for chest pain.       Objective:   Physical Exam  Constitutional: He appears well-developed and well-nourished. No distress.  HENT:  Right Ear: External ear normal.  Left Ear: External ear normal.  Mouth/Throat: Oropharynx is clear and moist.  Neck: Neck supple.  Cardiovascular: Normal rate, regular rhythm and normal heart sounds.   Pulmonary/Chest: Effort normal and breath sounds normal. No respiratory distress. He has no wheezes. He has no rales.  Musculoskeletal: He exhibits no edema.  Lymphadenopathy:    He has no cervical adenopathy.          Assessment & Plan:  Persistent cough. Nonfocal exam. He does not have any obvious reflux or reactive airway issues. No significant postnasal drip. Still possible this is related to recent viral bronchitis.  Trial tramadol for cough suppression. If no better in one to 2 weeks pulmonary referral

## 2011-08-08 NOTE — Patient Instructions (Signed)
Touch base in 2 weeks if cough no better.

## 2011-08-21 ENCOUNTER — Telehealth: Payer: Self-pay | Admitting: Family Medicine

## 2011-08-21 NOTE — Telephone Encounter (Signed)
Had bronchitis for 6 weeks and was put Tramadol and it worked. Thanks.

## 2011-09-07 ENCOUNTER — Telehealth: Payer: Self-pay | Admitting: Cardiology

## 2011-09-07 DIAGNOSIS — I119 Hypertensive heart disease without heart failure: Secondary | ICD-10-CM

## 2011-09-07 MED ORDER — AMLODIPINE BESYLATE 5 MG PO TABS: 5.0000 mg | ORAL_TABLET | Freq: Every day | ORAL | Status: DC

## 2011-09-07 NOTE — Telephone Encounter (Signed)
Add amlodipine 5 mg one daily for additional blood pressure control.  See in the office in next one to 2 weeks if blood pressure not improved.

## 2011-09-07 NOTE — Telephone Encounter (Signed)
New message: Pt called and stated his BP this am was 160/105.  Does he need to be seen.  Orthopedic told him to call us. Please call and advise.

## 2011-09-07 NOTE — Telephone Encounter (Signed)
Fu call He said his BP 160/105 he wants to know what he nees to do

## 2011-09-07 NOTE — Telephone Encounter (Signed)
See or adjust meds

## 2011-09-07 NOTE — Telephone Encounter (Signed)
For last couple of days headache and has had some chest pressure.  Advised of added medication and to go to ED if pain/pressure worse, shortness of breath, or radiating pain.  Stated he thought all related to blood pressure, verbalized understanding.  Has 12/28 appointment and will keep then.

## 2011-09-08 ENCOUNTER — Emergency Department (HOSPITAL_COMMUNITY): Payer: 59

## 2011-09-08 ENCOUNTER — Inpatient Hospital Stay (HOSPITAL_COMMUNITY)
Admission: EM | Admit: 2011-09-08 | Discharge: 2011-09-10 | DRG: 309 | Disposition: A | Discharge status: Home / Self Care | Payer: 59 | Attending: Cardiology | Admitting: Cardiology

## 2011-09-08 ENCOUNTER — Other Ambulatory Visit: Payer: Self-pay

## 2011-09-08 ENCOUNTER — Encounter (HOSPITAL_COMMUNITY): Payer: Self-pay | Admitting: Emergency Medicine

## 2011-09-08 DIAGNOSIS — E7800 Pure hypercholesterolemia, unspecified: Secondary | ICD-10-CM | POA: Diagnosis present

## 2011-09-08 DIAGNOSIS — R059 Cough, unspecified: Secondary | ICD-10-CM | POA: Diagnosis present

## 2011-09-08 DIAGNOSIS — R05 Cough: Secondary | ICD-10-CM | POA: Insufficient documentation

## 2011-09-08 DIAGNOSIS — I480 Paroxysmal atrial fibrillation: Secondary | ICD-10-CM | POA: Insufficient documentation

## 2011-09-08 DIAGNOSIS — I4891 Unspecified atrial fibrillation: Principal | ICD-10-CM

## 2011-09-08 DIAGNOSIS — Q231 Congenital insufficiency of aortic valve: Secondary | ICD-10-CM

## 2011-09-08 DIAGNOSIS — I10 Essential (primary) hypertension: Secondary | ICD-10-CM | POA: Insufficient documentation

## 2011-09-08 DIAGNOSIS — Z7901 Long term (current) use of anticoagulants: Secondary | ICD-10-CM

## 2011-09-08 DIAGNOSIS — R053 Chronic cough: Secondary | ICD-10-CM | POA: Insufficient documentation

## 2011-09-08 DIAGNOSIS — D72829 Elevated white blood cell count, unspecified: Secondary | ICD-10-CM

## 2011-09-08 DIAGNOSIS — Z7982 Long term (current) use of aspirin: Secondary | ICD-10-CM

## 2011-09-08 DIAGNOSIS — R002 Palpitations: Secondary | ICD-10-CM

## 2011-09-08 LAB — BASIC METABOLIC PANEL
CO2: 21 mEq/L (ref 19–32)
Chloride: 102 mEq/L (ref 96–112)
Creatinine, Ser: 0.68 mg/dL (ref 0.50–1.35)
GFR calc Af Amer: >90 mL/min (ref >90)
Potassium: 3.8 mEq/L (ref 3.5–5.1)

## 2011-09-08 LAB — CBC
HCT: 46 % (ref 39.0–52.0)
Hemoglobin: 16.6 g/dL (ref 13.0–17.0)
MCV: 82.4 fL (ref 78.0–100.0)
RDW: 13.7 % (ref 11.5–15.5)
WBC: 21.7 10*3/uL — ABNORMAL HIGH (ref 4.0–10.5)

## 2011-09-08 LAB — PROTIME-INR
INR: 2.21 — ABNORMAL HIGH (ref 0.00–1.49)
Prothrombin Time: 24.9 seconds — ABNORMAL HIGH (ref 11.6–15.2)

## 2011-09-08 LAB — PRO B NATRIURETIC PEPTIDE: Pro B Natriuretic peptide (BNP): 115.1 pg/mL (ref 0–125)

## 2011-09-08 MED ORDER — DEXTROSE 5 % IV SOLN
5.0000 mg/h | INTRAVENOUS | Status: AC
  Administered 2011-09-08: 5 mg/h via INTRAVENOUS
  Filled 2011-09-08: qty 100

## 2011-09-08 MED ORDER — DILTIAZEM HCL 25 MG/5ML IV SOLN
10.0000 mg | Freq: Once | INTRAVENOUS | Status: AC
  Administered 2011-09-08: 10 mg via INTRAVENOUS
  Filled 2011-09-08: qty 5

## 2011-09-08 NOTE — ED Provider Notes (Signed)
History     CSN: [id number]  Arrival date & time 09/08/11  2026   First MD Initiated Contact with Patient 09/08/11 2026      Chief Complaint  Patient presents with  . Chest Pain  . Tachycardia    (Consider location/radiation/quality/duration/timing/severity/associated sxs/prior treatment) HPI Comments: Hx of afib. Previously has slept it off, never been to the hospital for it. Today felt nausea and new burning chest pain.  Patient is a 66 y.o. male presenting with palpitations. The history is provided by the patient. No language interpreter was used.  Palpitations  This is a recurrent problem. The current episode started yesterday. The problem occurs constantly. The problem has not changed since onset.The problem is associated with an unknown factor. On average, each episode lasts 2 days. Associated symptoms include nausea (occasional). Pertinent negatives include no fever, no abdominal pain, no vomiting, no dizziness and no cough.    Past Medical History  Diagnosis Date  . Paroxysmal atrial fibrillation   . Hypercholesteremia   . Hypertension   . Aortic insufficiency   . Systolic murmur   . Kidney stone   . Kidney stone 2003    Past Surgical History  Procedure Date  . Arthoscopic rotaor cuff repair   . Tonsillectomy   . Rotator cuff repair 2000  . Knee surgery 1990, 2000  . Hernia repair 2005  . Eye surgery     1950, 1960, 1964    Family History  Problem Relation Age of Onset  . Stroke Mother   . Hypertension Mother   . Kidney disease Mother     stones  . Kidney disease Brother     stones  . Arthritis Paternal Grandmother   . Heart disease Paternal Grandmother     History  Substance Use Topics  . Smoking status: Never Smoker   . Smokeless tobacco: Not on file  . Alcohol Use: No      Review of Systems  Constitutional: Negative for fever.  Respiratory: Negative for cough.   Cardiovascular: Positive for palpitations.  Gastrointestinal: Positive for  nausea (occasional). Negative for vomiting, abdominal pain and diarrhea.  Neurological: Negative for dizziness.  All other systems reviewed and are negative.    Allergies  Amiodarone; Floxin; Lisinopril; and Tavist allergy  Home Medications   Current Outpatient Rx  Name Route Sig Dispense Refill  . AMLODIPINE BESYLATE 5 MG PO TABS Oral Take 1 tablet (5 mg total) by mouth daily. 30 tablet 11  . ASPIRIN 81 MG PO TABS Oral Take 81 mg by mouth daily.      [name] VITAMIN D3 1000 UNITS PO CAPS Oral Take 2 capsules by mouth daily.      [name] DIGOXIN 0.125 MG PO TABS Oral Take 125 mcg by mouth daily.      [name] GLUCOSAMINE 1500 COMPLEX PO CAPS Oral Take 2 capsules by mouth daily.      [name] LOSARTAN POTASSIUM-HCTZ 100-12.5 MG PO TABS Oral Take 1 tablet by mouth daily. 90 tablet 3  . LOVASTATIN 40 MG PO TABS Oral Take 1 tablet (40 mg total) by mouth daily. 90 tablet 3  . METOPROLOL SUCCINATE ER 50 MG PO TB24 Oral Take 50 mg by mouth. 1 IN THE AM, 1/2 IN THE PM    . MULTIPLE VITAMIN PO Oral Take by mouth daily.      . WARFARIN SODIUM 5 MG PO TABS Oral Take 5 mg by mouth daily. Take 5 mg on Tues, Thurs, and Sunday. Take 10 mg  on Mon, wed, fri, and Sat.      BP 121/71  Pulse 126  Temp(Src) 95 F (35 C) (Oral)  Resp 18  SpO2 97%  Physical Exam  Constitutional: He is oriented to person, place, and time. He appears well-developed and well-nourished. No distress.  HENT:  Head: Normocephalic and atraumatic.  Mouth/Throat: No oropharyngeal exudate.  Eyes: EOM are normal. Pupils are equal, round, and reactive to light.  Neck: Normal range of motion. Neck supple.  Cardiovascular: An irregularly irregular rhythm present. Tachycardia present.  Exam reveals no friction rub.   No murmur heard. Pulmonary/Chest: Effort normal and breath sounds normal. No respiratory distress. He has no wheezes. He has no rales.  Abdominal: He exhibits no distension. There is no tenderness. There is no rebound.  Musculoskeletal:  Normal range of motion. He exhibits no edema.  Neurological: He is alert and oriented to person, place, and time.  Skin: He is not diaphoretic.    ED Course  Procedures (including critical care time)  Labs Reviewed  DIGOXIN LEVEL - Abnormal; Notable for the following:    Digoxin Level 0.4 (*)    All other components within normal limits  PROTIME-INR - Abnormal; Notable for the following:    Prothrombin Time 24.9 (*)    INR 2.21 (*)    All other components within normal limits  BASIC METABOLIC PANEL - Abnormal; Notable for the following:    Glucose, Bld 182 (*)    BUN 24 (*)    All other components within normal limits  CBC - Abnormal; Notable for the following:    WBC 21.7 (*)    MCHC 36.1 (*)    All other components within normal limits  TROPONIN I  PRO B NATRIURETIC PEPTIDE  TSH   Dg Chest Portable 1 View  09/08/2011  *RADIOLOGY REPORT*  Clinical Data: Bronchitis with leukocytosis.  PORTABLE CHEST - 1 VIEW  Comparison: 07/30/2011  Findings: 2250 hours.  Low volume film. The lungs are clear without focal infiltrate, edema, pneumothorax or pleural effusion.  Tiny nodule at the right base is unchanged and seen to represent calcified granuloma on CT from 09/29/2009. Cardiopericardial silhouette is at upper limits of normal for size. Telemetry leads overlie the chest.  IMPRESSION: No acute cardiopulmonary process.  Original Report Authenticated By: ERIC A. MANSELL, M.D.        Pro b natriuretic peptide (Final result)   Component (Lab Inquiry)      Result Time  Pro B Natriuretic peptide (BNP)    09/08/11 2205  115.1         Troponin I (Final result)   Component (Lab Inquiry)      Result Time  TROPONIN I    09/08/11 2204  <0.30 Due to the release kinetics of cTnI, a negative result within the first hours of the onset of symptoms does not rule out myocardial infarction with certainty. If myocardial infarction is still suspected, repeat the test at appropriate intervals.          Digoxin level (Final result)  Abnormal  Component (Lab Inquiry)      Result Time  DIGOXIN    09/08/11 2113  0.4 (L)         Basic metabolic panel (Final result)  Abnormal  Component (Lab Inquiry)      Result Time  NA  K  CL  CO2  GLUCOSE    09/08/11 2113  135  3.8  102  21  182 (H)  Result Time  BUN  Creatinine, Ser  CALCIUM  GFR calc non Af Amer  GFR calc Af Amer    09/08/11 2113  24 (H)  0.68  9.6  >90  >90 The eGFR has been calculated using the CKD EPI equation. This calculation has not been validated in all clinical situations. eGFR's persistently <90 mL/min signify possible Chronic Kidney Disease.         Protime-INR (Final result)  Abnormal  Component (Lab Inquiry)      Result Time  Prothrombin Time  INR    09/08/11 2110  24.9 (H)  2.21 (H)         CBC (Final result)  Abnormal  Component (Lab Inquiry)      Result Time  WBC  RBC  HGB  HCT  MCV    09/08/11 2048  21.7 (H)  5.58  16.6  46.0  82.4           Result Time  MCH  MCHC  RDW  PLT    09/08/11 2048  29.7  36.1 (H)  13.7  227      1. Atrial fibrillation with RVR   2. Palpitations      EKG shows irregularly irregular HR c/w Afib with RVR. Tachycardic. No signs of acute ischemia. MDM  15M p/w palpitations. Hx of Afib, on metoprolol and Digoxin. Hx of previous episodes, self-limited after a few days. Mild burning chest pain, otherwise asymptomatic. No dyspnea. Tachycardic, normotensive on arrival. Irregularly irregular HR. Exam otherwise benign. Unable to get rate controlled with Dilt bolus of 10 x 2 and 52m/hr of dilt gtt. Labs show normal troponin, normal BNP. Digoxin level mildly subtherapeutic. INR therapeutic at 2.21.  CXR benign. LCantrilCardiology consulted for Afib with RVR.  Cards admitting.       BEvelina Bucy MD 09/09/11 [id number]

## 2011-09-09 ENCOUNTER — Other Ambulatory Visit: Payer: Self-pay

## 2011-09-09 ENCOUNTER — Encounter (HOSPITAL_COMMUNITY): Payer: Self-pay | Admitting: *Deleted

## 2011-09-09 DIAGNOSIS — D72829 Elevated white blood cell count, unspecified: Secondary | ICD-10-CM

## 2011-09-09 DIAGNOSIS — R002 Palpitations: Secondary | ICD-10-CM

## 2011-09-09 DIAGNOSIS — R079 Chest pain, unspecified: Secondary | ICD-10-CM

## 2011-09-09 LAB — CBC
Hemoglobin: 16.1 g/dL (ref 13.0–17.0)
MCH: 28.6 pg (ref 26.0–34.0)
MCHC: 34.1 g/dL (ref 30.0–36.0)
RDW: 14 % (ref 11.5–15.5)

## 2011-09-09 LAB — BASIC METABOLIC PANEL
Calcium: 9.2 mg/dL (ref 8.4–10.5)
GFR calc non Af Amer: >90 mL/min (ref >90)
Glucose, Bld: 112 mg/dL — ABNORMAL HIGH (ref 70–99)
Sodium: 142 mEq/L (ref 135–145)

## 2011-09-09 LAB — TSH: TSH: 1.343 u[IU]/mL (ref 0.350–4.500)

## 2011-09-09 LAB — PROTIME-INR: INR: 3.08 — ABNORMAL HIGH (ref 0.00–1.49)

## 2011-09-09 MED ORDER — ROSUVASTATIN CALCIUM 5 MG PO TABS
5.0000 mg | ORAL_TABLET | Freq: Every day | ORAL | Status: DC
  Administered 2011-09-09 – 2011-09-10 (×2): 5 mg via ORAL
  Filled 2011-09-09 (×2): qty 1

## 2011-09-09 MED ORDER — LOSARTAN POTASSIUM 50 MG PO TABS
100.0000 mg | ORAL_TABLET | Freq: Every day | ORAL | Status: DC
  Filled 2011-09-09: qty 2

## 2011-09-09 MED ORDER — DILTIAZEM HCL 30 MG PO TABS
30.0000 mg | ORAL_TABLET | Freq: Four times a day (QID) | ORAL | Status: DC
  Administered 2011-09-09 – 2011-09-10 (×5): 30 mg via ORAL
  Filled 2011-09-09 (×8): qty 1

## 2011-09-09 MED ORDER — LOSARTAN POTASSIUM-HCTZ 100-12.5 MG PO TABS: 1.0000 | ORAL_TABLET | Freq: Every day | ORAL | Status: DC

## 2011-09-09 MED ORDER — METOPROLOL TARTRATE 50 MG PO TABS
50.0000 mg | ORAL_TABLET | Freq: Two times a day (BID) | ORAL | Status: DC
  Administered 2011-09-09 – 2011-09-10 (×4): 50 mg via ORAL
  Filled 2011-09-09 (×5): qty 1

## 2011-09-09 MED ORDER — ASPIRIN 300 MG RE SUPP: 300.0000 mg | RECTAL | Status: AC

## 2011-09-09 MED ORDER — DILTIAZEM HCL 100 MG IV SOLR
5.0000 mg/h | INTRAVENOUS | Status: DC
  Administered 2011-09-09: 5 mg/h via INTRAVENOUS
  Filled 2011-09-09: qty 100

## 2011-09-09 MED ORDER — SODIUM CHLORIDE 0.9 % IV SOLN
Freq: Once | INTRAVENOUS | Status: AC
  Administered 2011-09-09: 03:00:00 via INTRAVENOUS

## 2011-09-09 MED ORDER — ONDANSETRON HCL 4 MG/2ML IJ SOLN: 4.0000 mg | Freq: Four times a day (QID) | INTRAMUSCULAR | Status: DC | PRN

## 2011-09-09 MED ORDER — VITAMIN D3 25 MCG (1000 UNIT) PO TABS
2000.0000 [IU] | ORAL_TABLET | Freq: Every day | ORAL | Status: DC
  Administered 2011-09-09 – 2011-09-10 (×2): 2000 [IU] via ORAL
  Filled 2011-09-09 (×2): qty 2

## 2011-09-09 MED ORDER — VITAMIN D3 25 MCG (1000 UT) PO CAPS: 2.0000 | ORAL_CAPSULE | Freq: Every day | ORAL | Status: DC

## 2011-09-09 MED ORDER — WARFARIN SODIUM 5 MG PO TABS
5.0000 mg | ORAL_TABLET | Freq: Every day | ORAL | Status: DC
  Filled 2011-09-09: qty 1

## 2011-09-09 MED ORDER — DIGOXIN 125 MCG PO TABS
125.0000 ug | ORAL_TABLET | Freq: Every day | ORAL | Status: DC
  Administered 2011-09-09 – 2011-09-10 (×2): 125 ug via ORAL
  Filled 2011-09-09 (×2): qty 1

## 2011-09-09 MED ORDER — HYDROCHLOROTHIAZIDE 12.5 MG PO CAPS
12.5000 mg | ORAL_CAPSULE | Freq: Every day | ORAL | Status: DC
  Administered 2011-09-09 – 2011-09-10 (×2): 12.5 mg via ORAL
  Filled 2011-09-09 (×2): qty 1

## 2011-09-09 MED ORDER — WARFARIN SODIUM 5 MG PO TABS
5.0000 mg | ORAL_TABLET | Freq: Once | ORAL | Status: AC
  Administered 2011-09-09: 5 mg via ORAL
  Filled 2011-09-09: qty 1

## 2011-09-09 MED ORDER — GLUCOSAMINE 1500 COMPLEX PO CAPS: 2.0000 | ORAL_CAPSULE | Freq: Every day | ORAL | Status: DC

## 2011-09-09 MED ORDER — DILTIAZEM HCL 100 MG IV SOLR
5.0000 mg/h | INTRAVENOUS | Status: DC
  Filled 2011-09-09: qty 100

## 2011-09-09 MED ORDER — ASPIRIN 81 MG PO CHEW
324.0000 mg | CHEWABLE_TABLET | ORAL | Status: AC
  Administered 2011-09-09: 324 mg via ORAL
  Filled 2011-09-09: qty 4

## 2011-09-09 MED ORDER — SODIUM CHLORIDE 0.9 % IJ SOLN
3.0000 mL | Freq: Two times a day (BID) | INTRAMUSCULAR | Status: DC
  Administered 2011-09-09 – 2011-09-10 (×2): 3 mL via INTRAVENOUS

## 2011-09-09 MED ORDER — ACETAMINOPHEN 325 MG PO TABS: 650.0000 mg | ORAL_TABLET | ORAL | Status: DC | PRN

## 2011-09-09 MED ORDER — ASPIRIN EC 81 MG PO TBEC
81.0000 mg | DELAYED_RELEASE_TABLET | Freq: Every day | ORAL | Status: DC
  Administered 2011-09-10: 81 mg via ORAL
  Filled 2011-09-09: qty 1

## 2011-09-09 NOTE — H&P (Signed)
Primary Cardiologist: [name] [facility])  Patient Location: Room 2032  Chief Complaint: chest pressure and palpitations  HPI: Francis Campbell is a pleasant 65 year old gentleman with a history of paroxysmal atrial fibrillation, bicuspid aortic valve with moderate aortic insufficiency, hypertension, and hypercholesterolemia who presents with two days of palpitations and central chest pressure.  Since Thursday afternoon, he noted his heart to be "out of rhythm," with episodes of rapid heart beats and irregular heartbeats. He reports that his Afib paroxysms in the past have been self-limited and generally resolve within 48 hours. This time, his symptoms persisted, and in fact became more pronounced on 12/04/2022 afternoon with sustained rapid palpitations associated with some mild nausea. He reports headaches over the past few days, with no photophobia or neck stiffness. He denies myalgias, but has felt fatigued for most of the past week. He has had chills for the past several days, though he has not taken his temperature. He has a dry, non-productive, non-pleuritic cough. He denies abdominal pain or bloating. He denies diarrhea. His appetite has reportedly been normal this week. He has had sick contacts in the form of work colleagues who have had URI symptoms over the past few weeks.  He has chronic shoulder pain, was worse earlier this week. He saw his orthopedist and received a corticosteroid injection on Friday afternoon (12/21). His shoulder is not currently bothering him. Finally, he takes his BP at home and upon noting higher BPs these past few days, he called Dr. Sherryl Barters office and was started yesterday on Amlodipine 69m daily.   On the basis of his ongoing symptoms[facility]. He was initially found to be in atrial fibrillation with RVR in the 150s-160s.   Past Medical History  Diagnosis Date  . Paroxysmal atrial fibrillation   .  Hypercholesteremia   . Hypertension   . Aortic insufficiency   . Systolic murmur   . Kidney stone   . Kidney stone 2003  . Stroke   Additional PMHx: Prostatitis treated with Ciprofloxacin for 2 month period ending approximately 1 week ago R shoulder pain s/p corticosteroid injection on day prior to admission   Past Surgical History  Procedure Date  . Arthoscopic rotaor cuff repair   . Tonsillectomy   . Rotator cuff repair 2000  . Knee surgery 1990, 2000  . Hernia repair 2005  . Eye surgery     1950, 1960, 1964    Family History  Problem Relation Age of Onset  . Stroke Mother   . Hypertension Mother   . Kidney disease Mother     stones  . Kidney disease Brother     stones  . Arthritis Paternal Grandmother   . 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 Amiodarone     Abn  thyroid  . Floxin (Ofloxacin)     hallucinations  . Lisinopril Cough  . Tavist Allergy (Clemastine Fumarate)     A fib worse  . Robitussin Cf (Pseudoephedrine-Dm-Gg) Palpitations    Medications Prior to Admission  Medication Dose Route Frequency Provider Last Rate Last Dose  . 0.9 %  sodium chloride infusion   Intravenous Once DToya Smothers500 mL/hr at 09/09/11 0244    . acetaminophen (TYLENOL) tablet 650 mg  650 mg  Oral Q4H PRN Toya Smothers      . aspirin chewable tablet 324 mg  324 mg Oral NOW Marcha Licklider   324 mg at 09/09/11 8299   Or  . aspirin suppository 300 mg  300 mg Rectal NOW Toya Smothers      . aspirin EC tablet 81 mg  81 mg Oral Daily Toya Smothers      . cholecalciferol (VITAMIN D) tablet 2,000 Units  2,000 Units Oral Daily Veronda Francee Nodal, PHARMD      . digoxin (LANOXIN) tablet 125 mcg  125 mcg Oral Daily Toya Smothers      . diltiazem (CARDIZEM) 100 mg in dextrose 5 % 100 mL infusion   5-15 mg/hr Intravenous To Major Evelina Bucy, MD 5 mL/hr at 09/08/11 2131 5 mg/hr at 09/08/11 2131  . diltiazem (CARDIZEM) 100 mg in dextrose 5 % 100 mL infusion  5 mg/hr Intravenous Continuous Toya Smothers 5 mL/hr at 09/09/11 0325 5 mg/hr at 09/09/11 0325  . diltiazem (CARDIZEM) injection 10 mg  10 mg Intravenous Once Evelina Bucy, MD   10 mg at 09/08/11 2050  . hydrochlorothiazide (MICROZIDE) capsule 12.5 mg  12.5 mg Oral Daily Rogue Bussing, PHARMD      . losartan (COZAAR) tablet 100 mg  100 mg Oral Daily Rogue Bussing, [place]      . metoprolol (LOPRESSOR) tablet 50 mg  50 mg Oral BID Kinte Trim   50 mg at 09/09/11 0324  . ondansetron (ZOFRAN) injection 4 mg  4 mg Intravenous Q6H PRN Toya Smothers      . rosuvastatin (CRESTOR) tablet 5 mg  5 mg Oral Daily Toya Smothers      . warfarin (COUMADIN) tablet 5 mg  5 mg Oral q1800 Toya Smothers      . DISCONTD: Glucosamine 1500 Complex CAPS 2 capsule  2 capsule Oral Daily Toya Smothers      . DISCONTD: losartan-hydrochlorothiazide (HYZAAR) 100-12.5 MG per tablet 1 tablet  1 tablet Oral Daily Toya Smothers      . DISCONTD: Vitamin D3 CAPS 2,000 Units  2 capsule Oral Daily Toya Smothers       Medications Prior to Admission  Medication Sig Dispense Refill  . amLODipine (NORVASC) 5 MG tablet Take 1 tablet (5 mg total) by mouth daily.  30 tablet  11  . aspirin 81 MG tablet Take 81 mg by mouth daily.        . digoxin (LANOXIN) 0.125 MG tablet Take 125 mcg by mouth daily.        [name] losartan-hydrochlorothiazide (HYZAAR) 100-12.5 MG per tablet Take 1 tablet by mouth daily.  90 tablet  3  . lovastatin (MEVACOR) 40 MG tablet Take 1 tablet (40 mg total) by mouth daily.  90 tablet  3  . metoprolol (TOPROL-XL) 50 MG 24 hr tablet Take 50 mg by mouth. 1 IN THE AM, 1/2 IN THE PM      . MULTIPLE VITAMIN PO Take by mouth daily.        [name] warfarin (COUMADIN) 5 MG tablet Take 5 mg by mouth daily. Take 5 mg on Tues, Thurs, and Sunday. Take 10 mg on Mon, wed,  fri, and Sat.       Exam: T 95.0 F 129/82 HR 80-100 (irregular) RR 14 Pulse Ox 98% RA GEN no acute distress, breathing comfortably without the use of accessory muscles NECK supple, JVP at 5cm H2O; no neck stiffness CHEST clear to auscultation bilaterally; no crackles/wheezes CARDIAC irregular  rate, S1 S2 ABD soft, non-tender, non-distended, (+)bowel sounds EXT warm well-perfused, no edema SKIN warm & dry NEURO alert & orientedx3   Results for orders placed during the hospital encounter of 09/08/11 (from the past 48 hour(s))  DIGOXIN LEVEL     Status: Abnormal   Collection Time   09/08/11  8:34 PM      Component Value Range Comment   Digoxin Level 0.4 (*) 0.8 - 2.0 (ng/mL)   PROTIME-INR     Status: Abnormal   Collection Time   09/08/11  8:34 PM      Component Value Range Comment   Prothrombin Time 24.9 (*) 11.6 - 15.2 (seconds)    INR 2.21 (*) 0.00 - 8.78    BASIC METABOLIC PANEL     Status: Abnormal   Collection Time   09/08/11  8:34 PM      Component Value Range Comment   Sodium 135  135 - 145 (mEq/L)    Potassium 3.8  3.5 - 5.1 (mEq/L)    Chloride 102  96 - 112 (mEq/L)    CO2 21  19 - 32 (mEq/L)    Glucose, Bld 182 (*) 70 - 99 (mg/dL)    BUN 24 (*) 6 - 23 (mg/dL)    Creatinine, Ser 0.68  0.50 - 1.35 (mg/dL)    Calcium 9.6  8.4 - 10.5 (mg/dL)    GFR calc non Af Amer >90  >90 (mL/min)    GFR calc Af Amer >90  >90 (mL/min)   CBC     Status: Abnormal   Collection Time   09/08/11  8:34 PM      Component Value Range Comment   WBC 21.7 (*) 4.0 - 10.5 (K/uL)    RBC 5.58  4.22 - 5.81 (MIL/uL)    Hemoglobin 16.6  13.0 - 17.0 (g/dL)    HCT 46.0  39.0 - 52.0 (%)    MCV 82.4  78.0 - 100.0 (fL)    MCH 29.7  26.0 - 34.0 (pg)    MCHC 36.1 (*) 30.0 - 36.0 (g/dL)    RDW 13.7  11.5 - 15.5 (%)    Platelets 227  150 - 400 (K/uL)   TROPONIN I     Status: Normal   Collection Time   09/08/11  9:04 PM      Component Value Range Comment   Troponin I <0.30  <0.30 (ng/mL)   TSH      Status: Normal   Collection Time   09/08/11  9:05 PM      Component Value Range Comment   TSH 1.343  0.350 - 4.500 (uIU/mL)   PRO B NATRIURETIC PEPTIDE     Status: Normal   Collection Time   09/08/11  9:12 PM      Component Value Range Comment   Pro B Natriuretic peptide (BNP) 115.1  0 - 125 (pg/mL)    CXR: no focal infiltrate; no edema evident  ECG: afib with ventricular rate of 100, no ST segment deviation  Assessment/Plan  65 year old gentleman presenting with a symptomatic episode of atrial fibrillation. One of the potential triggers for this episode includes a viral syndrome, given his WBC count of 21K together with his subjective chills, cough, & fatigue this week. His labs (BUN/Cr ration > 20) are suggestive of accompanying dehydration. I think management will center on supportive correction of his dehydration and re-establishment of his home medication regimen. Though he has a history of aortic insufficiency, I do not  think that he is in any way decompensated. Instead, I think he is volume depleted. He is still currently in atrial fibrillation but with a more controlled ventricular response.    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 He will receive a 1 Liter normal saline bolus overnight with subsequent volume and laboratory re-assessment in the morning. He may require & benefit from further volume administration. Will also encourage PO intake. - Should he spike a fever or should his WBC count climb, can then consider cultures. Without a fever and given his current clinical stability, I do not think blood cultures would be of sufficient yield at this point.  2) Hypertension - Given his normal blood pressure on the  Diltiazem infusion, together with his relative dehydration,  I have elected to hold his ARB/HCTZ combination pill. This should provide adequate BP room for optimal beta-blocker titration.  - I also wonder whether his relative HTN over the past few days might have been response to a viral syndrome and to his shoulder pain. He may not ultimately require the addition of Amlodipine, but we can see where his BP settles out after the above management.    3) Leukocytosis - As per above, may represent viral syndrome over this past week - Could also represent a reaction to corticosteroid injection yesterday, though this would be unusual for a non-systemic, locally delivered injection.  - Given his 2 month course of Ciprofloxacin, Clostridial disease is possible, but he has an entirely benign abdominal exam and no diarrhea or changes in his bowel habits.    I discussed the plan with the patient and with the patient's wife. Both voiced understanding. Their questions were answered to the best of my ability.  DToya Smothers MD Cardiology Fellow On-Call

## 2011-09-09 NOTE — Progress Notes (Addendum)
Subjective:  Patient has had a persistent cough which is nonproductive.   Denies any fever, chills, dysuria.  Afebrile.  Received steroid injection in right shoulder Friday.  WBC trending down.  Objective:  Vital Signs in the last 24 hours: Temp:  [97.3 F (36.3 C)-97.8 F (36.6 C)] 97.3 F (36.3 C) (12/23 0510) Pulse Rate:  [79-176] 96  (12/23 0510) Resp:  [15-24] 19  (12/23 0510) BP: (90-129)/(65-83) 127/76 mmHg (12/23 0510) SpO2:  [97 %-99 %] 98 % (12/23 0510) Weight:  [183 lb 11.2 oz (83.326 kg)] 183 lb 11.2 oz (83.326 kg) (12/23 0238)  Intake/Output from previous day:   Intake/Output from this shift:       . sodium chloride   Intravenous Once  . aspirin  324 mg Oral NOW   Or  . aspirin  300 mg Rectal NOW  . aspirin EC  81 mg Oral Daily  . cholecalciferol  2,000 Units Oral Daily  . digoxin  125 mcg Oral Daily  . diltiazem (CARDIZEM) infusion  5-15 mg/hr Intravenous To Major  . diltiazem  10 mg Intravenous Once  . hydrochlorothiazide  12.5 mg Oral Daily  . metoprolol tartrate  50 mg Oral BID  . rosuvastatin  5 mg Oral Daily  . warfarin  5 mg Oral q1800  . DISCONTD: Glucosamine 1500 Complex  2 capsule Oral Daily  . DISCONTD: losartan  100 mg Oral Daily  . DISCONTD: losartan-hydrochlorothiazide  1 tablet Oral Daily  . DISCONTD: Vitamin D3  2 capsule Oral Daily      . diltiazem (CARDIZEM) infusion 5 mg/hr (09/09/11 0325)    Physical Exam: The patient appears to be in no distress.  Head and neck exam reveals that the pupils are equal and reactive.  The extraocular movements are full.  There is no scleral icterus.  Mouth and pharynx are benign.  No lymphadenopathy.  No carotid bruits.  The jugular venous pressure is normal.  Thyroid is not enlarged or tender.  Chest is clear to percussion and auscultation.  No rales or rhonchi.  Expansion of the chest is symmetrical.  Heart reveals no abnormal lift or heave.  First and second heart sounds are normal.  There is a  soft systolic murmur at base. No AI heard today.  The abdomen is soft and nontender.  Bowel sounds are normoactive.  There is no hepatosplenomegaly or mass.  There are no abdominal bruits.  Extremities reveal no phlebitis or edema.  Pedal pulses are good.  There is no cyanosis or clubbing.  Neurologic exam is normal strength and no lateralizing weakness.  No sensory deficits.  Integument reveals no rash  Lab Results:  Basename 09/09/11 0630 09/08/11 2034  WBC 17.8* 21.7*  HGB 16.1 16.6  PLT 225 227    Basename 09/09/11 0630 09/08/11 2034  NA 142 135  K 4.4 3.8  CL 104 102  CO2 27 21  GLUCOSE 112* 182*  BUN 22 24*  CREATININE 0.77 0.68    Basename 09/08/11 2104  TROPONINI <0.30   Hepatic Function Panel No results found for this basename: PROT,ALBUMIN,AST,ALT,ALKPHOS,BILITOT,BILIDIR,IBILI in the last 72 hours No results found for this basename: CHOL in the last 72 hours No results found for this basename: PROTIME in the last 72 hours  Imaging: Imaging results have been reviewed.  Heart upper nl size.  Lungs clear  Cardiac Studies:  Assessment/Plan:  Patient Active Hospital Problem List: Paroxysmal atrial fibrillation ()   Assessment: Rate now controlled on 5 mg  cardizem IV   Plan: Continue IV cardizem and restart oral meds Hypertension ()   Assessment: Controlled   Plan: Continue present meds except losartan. Cough, persistent (07/30/2011)   Assessment: May be allergic response to ARB.  He had cough from lisinopril    Plan: Stop losartan. Leucocytosis (09/09/2011)   Assessment: Improving.  No antibiotics yet.  Afebrile.   Plan: Recheck CBC in am.  Probably home in am if stable   LOS: 1 day    Darlin Coco 09/09/2011, 9:44 AM   Heart rate is now improved further.  Will start oral diltiazem then DC IV diltiazem.  Anticipate home in am probably on diltiazem, lopressor and digoxin.  He already has appt to see me in office Friday 09/14/11  Darlin Coco

## 2011-09-09 NOTE — Progress Notes (Signed)
ANTICOAGULATION CONSULT NOTE - Initial Consult  Pharmacy Consult for Coumadin Indication: atrial fibrillation  Allergies  Allergen Reactions  . Amiodarone     Abn  thyroid  . Floxin (Ofloxacin)     hallucinations  . Lisinopril Cough  . Tavist Allergy (Clemastine Fumarate)     A fib worse  . Robitussin Cf (Pseudoephedrine-Dm-Gg) Palpitations    Patient Measurements: Height: 6.2" (15.7 cm) Weight: 183 lb 11.2 oz (83.326 kg) IBW/kg (Calculated) : -73.74   Vital Signs: Temp: 97.3 F (36.3 C) (12/23 0510) Temp src: Oral (12/23 0510) BP: 127/76 mmHg (12/23 0510) Pulse Rate: 96  (12/23 0510)  Labs:  Basename 09/09/11 0630 09/08/11 2104 09/08/11 2034  HGB 16.1 -- 16.6  HCT 47.2 -- 46.0  PLT 225 -- 227  APTT -- -- --  LABPROT 32.3* -- 24.9*  INR 3.08* -- 2.21*  HEPARINUNFRC -- -- --  CREATININE 0.77 -- 0.68  CKTOTAL -- -- --  CKMB -- -- --  TROPONINI -- <0.30 --   Estimated Creatinine Clearance: -14.4 ml/min (by C-G formula based on Cr of 0.77).  Medical History: Past Medical History  Diagnosis Date  . Paroxysmal atrial fibrillation   . Hypercholesteremia   . Hypertension   . Aortic insufficiency   . Systolic murmur   . Kidney stone   . Kidney stone 2003  . Stroke     Medications:  Prescriptions prior to admission  Medication Sig Dispense Refill  . amLODipine (NORVASC) 5 MG tablet Take 1 tablet (5 mg total) by mouth daily.  30 tablet  11  . aspirin 81 MG tablet Take 81 mg by mouth daily.        . Cholecalciferol (VITAMIN D3) 1000 UNITS CAPS Take 2 capsules by mouth daily.        . digoxin (LANOXIN) 0.125 MG tablet Take 125 mcg by mouth daily.        . Glucosamine-Chondroit-Vit C-Mn (GLUCOSAMINE 1500 COMPLEX) CAPS Take 2 capsules by mouth daily.        [name] losartan-hydrochlorothiazide (HYZAAR) 100-12.5 MG per tablet Take 1 tablet by mouth daily.  90 tablet  3  . lovastatin (MEVACOR) 40 MG tablet Take 1 tablet (40 mg total) by mouth daily.  90 tablet  3  .  metoprolol (TOPROL-XL) 50 MG 24 hr tablet Take 50 mg by mouth. 1 IN THE AM, 1/2 IN THE PM      . MULTIPLE VITAMIN PO Take by mouth daily.        [name] warfarin (COUMADIN) 5 MG tablet Take 5 mg by mouth daily. Take 5 mg on Tues, Thurs, and Sunday. Take 10 mg on Mon, wed, fri, and Sat.        Admit Complaint: Chest pressure and palpitations Pharmacist System-Based Medication Review: Anticoagulation  Coumadin PTA; 5 mg on Tues, Thurs, and Sunday. Take 10 mg on Mon, wed, fri, and Sat. INR today therapeutic, at high end of goal. Noted patient took Coumadin 80m PTA 12/22.  Infectious Disease No abx on board- WBC elevated, pt afebrile. Recent corticosteroid injection for chronic shoulder pain (Friday PM).  Cardiovascular VSS, on metoprolol, HCTZ. Noted Amlodipine given x1; currently on dilt gtt for palpitations. ARB held d/t cough.  Endocrinology No hx DM, no CBG checks, WBG ok  Gastrointestinal / Nutrition Cardiac diet ordered  Nephrology Scr, lytes WNL at baseline  Pulmonary RA  Hematology / Oncology H/H, plts ok. WBC elevation-? D/t recent steroids  PTA Medication Issues Await resumption of home meds.  Best Practices Hx CVA- ASA, statin   Goal of Therapy:  INR 2-3   Plan: 1. Consider Dig level prior to d/c home. 2. Coumadin 39m PO x 1 today per home regimen 3. Will f/up daily INR- reinstate home regimen if INR stable  Kathy Wahid K. PPosey Pronto PharmD, BCPS.  Clinical Pharmacist Pager [phone] 09/09/2011 10:19 AM

## 2011-09-10 ENCOUNTER — Other Ambulatory Visit: Payer: Self-pay

## 2011-09-10 DIAGNOSIS — I4891 Unspecified atrial fibrillation: Secondary | ICD-10-CM

## 2011-09-10 LAB — BASIC METABOLIC PANEL
CO2: 28 mEq/L (ref 19–32)
Calcium: 9.5 mg/dL (ref 8.4–10.5)
Creatinine, Ser: 0.93 mg/dL (ref 0.50–1.35)
Glucose, Bld: 103 mg/dL — ABNORMAL HIGH (ref 70–99)

## 2011-09-10 LAB — CBC
Hemoglobin: 17.4 g/dL — ABNORMAL HIGH (ref 13.0–17.0)
MCH: 28.9 pg (ref 26.0–34.0)
MCV: 84.7 fL (ref 78.0–100.0)
Platelets: 214 10*3/uL (ref 150–400)
RBC: 6.02 MIL/uL — ABNORMAL HIGH (ref 4.22–5.81)

## 2011-09-10 MED ORDER — FLECAINIDE ACETATE 100 MG PO TABS
300.0000 mg | ORAL_TABLET | Freq: Two times a day (BID) | ORAL | Status: AC
  Administered 2011-09-10: 300 mg via ORAL
  Filled 2011-09-10: qty 3

## 2011-09-10 MED ORDER — DILTIAZEM HCL ER COATED BEADS 240 MG PO TB24: 240.0000 mg | ORAL_TABLET | Freq: Every day | ORAL | Status: DC

## 2011-09-10 MED ORDER — GUAIFENESIN ER 600 MG PO TB12
600.0000 mg | ORAL_TABLET | Freq: Two times a day (BID) | ORAL | Status: DC | PRN
  Administered 2011-09-10: 600 mg via ORAL
  Filled 2011-09-10: qty 1

## 2011-09-10 MED ORDER — WARFARIN SODIUM 7.5 MG PO TABS
7.5000 mg | ORAL_TABLET | Freq: Once | ORAL | Status: DC
  Filled 2011-09-10: qty 1

## 2011-09-10 MED ORDER — METOPROLOL SUCCINATE ER 100 MG PO TB24: 100.0000 mg | ORAL_TABLET | Freq: Every day | ORAL | Status: DC

## 2011-09-10 MED ORDER — GUAIFENESIN ER 600 MG PO TB12: 600.0000 mg | ORAL_TABLET | Freq: Two times a day (BID) | ORAL | Status: DC | PRN

## 2011-09-10 MED ORDER — HYDROCHLOROTHIAZIDE 12.5 MG PO CAPS: 12.5000 mg | ORAL_CAPSULE | Freq: Every day | ORAL | Status: DC

## 2011-09-10 NOTE — Discharge Summary (Signed)
Discharge Summary   Patient ID: Francis Campbell,  MRN: [id number], DOB/AGE: [birth year] 65 y.o.  Admit date: 09/08/2011 Discharge date: 09/10/2011  Discharge Diagnoses Principal Problem:  *Atrial fibrillation with RVR Active Problems:  Paroxysmal atrial fibrillation  Hypertension  Cough, persistent  Leucocytosis   Allergies Allergies  Allergen Reactions  . Amiodarone     Abn  thyroid  . Floxin (Ofloxacin)     hallucinations  . Lisinopril Cough  . Tavist Allergy (Clemastine Fumarate)     A fib worse  . Robitussin Cf (Pseudoephedrine-Dm-Gg) Palpitations    Procedures  None  History of Present Illness  Francis Campbell is a 65 yo male with PMHx significant for PAF, bicuspid aortic valve (moderate insufficiency), HTN and HL who was admitted to [facility] hospital for PAF with RVR in 150-160s.   Since Thursday afternoon, he noted his heart to be "out of rhythm," with episodes of rapid heart beats and irregular heartbeats. He reported that his Afib paroxysms in the past have been self-limited and generally resolve within 48 hours. This time, his symptoms persisted, and became more pronounced on 2022/12/16 afternoon with sustained rapid palpitations associated with some mild nausea. He reports headaches over the past few days, with no photophobia or neck stiffness. He denies myalgias, but has felt fatigued for most of the past week. He has had chills for the past several days, though he has not taken his temperature. He has a dry, non-productive, non-pleuritic cough. He denies abdominal pain or bloating. He denies diarrhea. His appetite has reportedly been normal this week. He has had sick contacts in the form of work colleagues who have had URI symptoms over the past few weeks.  Hospital Course   He was admitted to telemetry and continued on coumadin. Home Amlodipine and Hyzaar were held due to dehydration and to allow room for BB and Diltiazem titration for rate-control.    He remained in  atrial fibrillation on telemetry with adequate rate-control on Diltiazem IV and PO and Lopressor PO initially. The following day, his rates increased. He was given Flecainide and converted to NSR. INRs have been therapeutic throughout the admission.   He was found to have a leukocytosis on admission which was believed to be secondary to a viral URI and/or recent corticosteroid injection to the R shoulder. His WBC count trended down. The patient also reported a chronic cough, ARB was held. BPs well-controlled.   Currently, his symptoms have improved. He is stable, at baseline and will be discharged home. He will be discharged on Cardizem LA, Toprol and HCTZ. Digoxin will be discontinued. He will resume pre-schedule follow-up with [name].    Discharge Vitals:  Blood pressure 128/83, pulse 68, temperature 96.9 F (36.1 C), temperature source Oral, resp. rate 20, height 6.2" (0.157 m), weight 83.326 kg (183 lb 11.2 oz), SpO2 96.00%.   Labs: Recent Labs  Basename 09/10/11 0510 09/09/11 0630   WBC 12.4* 17.8*   HGB 17.4* 16.1   HCT 51.0 47.2   MCV 84.7 83.8   PLT 214 225    Lab 09/10/11 0510 09/09/11 0630 09/08/11 2034  NA 141 142 135  K 4.2 4.4 3.8  CL 104 104 102  CO2 28 27 21   BUN 19 22 24*  CREATININE 0.93 0.77 0.68  CALCIUM 9.5 9.2 9.6  PROT -- -- --  BILITOT -- -- --  ALKPHOS -- -- --  ALT -- -- --  AST -- -- --  AMYLASE -- -- --  LIPASE -- -- --  GLUCOSE 103* 112* 182*    Recent Labs  Basename 09/08/11 2104   CKTOTAL --   CKMB --   CKMBINDEX --   TROPONINI <0.30    Basename 09/08/11 2105  TSH 1.343  T4TOTAL --  T3FREE --  THYROIDAB --    Disposition: stable in NSR, at baseline Discharge Orders    Future Appointments: Provider: Department: Dept Phone: Center:   09/14/2011 9:15 AM Darlin Coco, MD [facility] [phone] None   09/14/2011 9:30 AM [place] [phone] [id number]     Follow-up Information     Follow up with Darlin Coco, MD on 09/14/2011. (As scheduled. )    Contact information:   [address] [place] [phone]          Discharge Medications:   Kinta, Martis  Home Medication Instructions ZMO:[id number]   Printed on:09/10/11 1533  Medication Information                    warfarin (COUMADIN) 5 MG tablet Take 5 mg by mouth daily. Take 5 mg on Tues, Thurs, and Sunday. Take 10 mg on Mon, wed, fri, and Sat.           aspirin 81 MG tablet Take 81 mg by mouth daily.             MULTIPLE VITAMIN PO Take by mouth daily.             lovastatin (MEVACOR) 40 MG tablet Take 1 tablet (40 mg total) by mouth daily.           Cholecalciferol (VITAMIN D3) 1000 UNITS CAPS Take 2 capsules by mouth daily.             Glucosamine-Chondroit-Vit C-Mn (GLUCOSAMINE 1500 COMPLEX) CAPS Take 2 capsules by mouth daily.             guaiFENesin (MUCINEX) 600 MG 12 hr tablet Take 1 tablet (600 mg total) by mouth 2 (two) times daily as needed.           hydrochlorothiazide (MICROZIDE) 12.5 MG capsule Take 1 capsule (12.5 mg total) by mouth daily.           diltiazem (CARDIZEM LA) 240 MG 24 hr tablet Take 1 tablet (240 mg total) by mouth daily.           metoprolol (TOPROL XL) 100 MG 24 hr tablet Take 1 tablet (100 mg total) by mouth daily.              Outstanding Labs/Studies: None   Duration of Discharge Encounter: 45 minutes including physician time.  Signed, R. Valeria Batman, PA-C 09/10/2011, 3:32 PM

## 2011-09-10 NOTE — Progress Notes (Signed)
Pt discharge instrutions given to pt and prescriptions given to patient and family. Will dc home as ordered-Creed Kail rn

## 2011-09-10 NOTE — ED Provider Notes (Signed)
I saw and evaluated the patient, reviewed the resident's note and I agree with the findings and plan.  The patient arrived complaining of palpitations.  He has a history of afib.  This started tonight.  On exam, the heart is irregularly irregular with no crackles or edema.  Labs okay.  He was given cardizem iv, then started on a drip which did not seem to slow his rate much.  Cardiology was consulted and will admit the patient to their service.  Veryl Speak, MD 09/10/11 [phone]

## 2011-09-10 NOTE — Progress Notes (Signed)
Patient ID: Francis Campbell, male   DOB: 01-12-1946, 65 y.o.   MRN: [id number] Subjective:  "I don't feel as well today"  Objective:  Vital Signs in the last 24 hours: Temp:  [97.4 F (36.3 C)-97.8 F (36.6 C)] 97.4 F (36.3 C) (12/24 0514) Pulse Rate:  [51-128] 128  (12/24 0927) Resp:  [16-18] 18  (12/24 0514) BP: (111-122)/(70-86) 117/77 mmHg (12/24 0927) SpO2:  [95 %-98 %] 95 % (12/24 0514)  Intake/Output from previous day: 12/23 0701 - 12/24 0700 In: 845.5 [P.O.:780; I.V.:65.5] Out: -  Intake/Output from this shift:    Physical Exam: Well appearing NAD HEENT: Unremarkable Neck:  No JVD, no thyromegally Lymphatics:  No adenopathy Back:  No CVA tenderness Lungs:  Clear with no wheezes. HEART:  IRegular rate rhythm, tachycardic, no murmurs, no rubs, no clicks Abd:  Flat, positive bowel sounds, no organomegally, no rebound, no guarding Ext:  2 plus pulses, no edema, no cyanosis, no clubbing Skin:  No rashes no nodules Neuro:  CN II through XII intact, motor grossly intact  Lab Results:  Basename 09/10/11 0510 09/09/11 0630  WBC 12.4* 17.8*  HGB 17.4* 16.1  PLT 214 225    Basename 09/10/11 0510 09/09/11 0630  NA 141 142  K 4.2 4.4  CL 104 104  CO2 28 27  GLUCOSE 103* 112*  BUN 19 22  CREATININE 0.93 0.77    Basename 09/08/11 2104  TROPONINI <0.30   Hepatic Function Panel No results found for this basename: PROT,ALBUMIN,AST,ALT,ALKPHOS,BILITOT,BILIDIR,IBILI in the last 72 hours No results found for this basename: CHOL in the last 72 hours No results found for this basename: PROTIME in the last 72 hours  Imaging: Dg Chest Portable 1 View  09/08/2011  *RADIOLOGY REPORT*  Clinical Data: Bronchitis with leukocytosis.  PORTABLE CHEST - 1 VIEW  Comparison: 07/30/2011  Findings: 2250 hours.  Low volume film. The lungs are clear without focal infiltrate, edema, pneumothorax or pleural effusion.  Tiny nodule at the right base is unchanged and seen to represent  calcified granuloma on CT from 09/29/2009. Cardiopericardial silhouette is at upper limits of normal for size. Telemetry leads overlie the chest.  IMPRESSION: No acute cardiopulmonary process.  Original Report Authenticated By: ERIC A. MANSELL, M.D.    Cardiac Studies:  Assessment/Plan:  1. Atrial fib - his ventricular rate is not well controlled. I have discussed the treatment options with the patient. He appears to have been out of rhythm since Saturday night. He has been therapuetic with his INR's. Will start flecainide 300 mg once now and plan DCCV if no NSR after 4 hours. 2. HTN - his blood pressure is well controlled.  LOS: 2 days     Cristopher Peru 09/10/2011, 9:59 AM

## 2011-09-10 NOTE — Progress Notes (Signed)
ANTICOAGULATION CONSULT NOTE - follow up  Pharmacy Consult for Coumadin Indication: atrial fibrillation  Allergies  Allergen Reactions  . Amiodarone     Abn  thyroid  . Floxin (Ofloxacin)     hallucinations  . Lisinopril Cough  . Tavist Allergy (Clemastine Fumarate)     A fib worse  . Robitussin Cf (Pseudoephedrine-Dm-Gg) Palpitations    Patient Measurements: Height: 6.2" (15.7 cm) Weight: 183 lb 11.2 oz (83.326 kg) IBW/kg (Calculated) : -73.74   Vital Signs: Temp: 97.4 F (36.3 C) (12/24 0514) Temp src: Oral (12/24 0514) BP: 116/70 mmHg (12/24 1207) Pulse Rate: 128  (12/24 0927)  Labs:  Basename 09/10/11 0510 09/09/11 0630 09/08/11 2104 09/08/11 2034  HGB 17.4* 16.1 -- --  HCT 51.0 47.2 -- 46.0  PLT 214 225 -- 227  APTT -- -- -- --  LABPROT 31.7* 32.3* -- 24.9*  INR 3.01* 3.08* -- 2.21*  HEPARINUNFRC -- -- -- --  CREATININE 0.93 0.77 -- 0.68  CKTOTAL -- -- -- --  CKMB -- -- -- --  TROPONINI -- -- <0.30 --   Estimated Creatinine Clearance: -12.4 ml/min (by C-G formula based on Cr of 0.93).  Assessment: INR 3.01. No  Bleeding reported. To get flecanide 356m x1, if no conversion to NSR will be cardioverted. Home coumadin dose 530mTTSun, 1053mWFSat.   Goal of Therapy:  INR 2-3   Plan: 1. . Coumadin 7.5mg67m x 1 dose today instead of 10mg36m with home regimen 3. Will f/up daily INR- reinstate home regimen if INR stable  Geza Beranek,Eudelia Bunchr: [phone]/2012 1:19 PM

## 2011-09-12 ENCOUNTER — Telehealth: Payer: Self-pay

## 2011-09-12 NOTE — Telephone Encounter (Signed)
Pt called and stated his bronchitis has come back. Pt was in the hospital on 09/08/11 for elevated bp. Pt states he has tried hycodan cough syrup and tramadol in the past for cough.  Pt states he does not want to trigger his heart situation again. Pt aware Dr. Elease Hashimoto is out of the office until 09/17/11. Pls advise.

## 2011-09-13 MED ORDER — HYDROCODONE-HOMATROPINE 5-1.5 MG/5ML PO SYRP: 5.0000 mL | ORAL_SOLUTION | Freq: Four times a day (QID) | ORAL | Status: DC | PRN

## 2011-09-13 MED ORDER — TRAMADOL HCL 50 MG PO TABS: 50.0000 mg | ORAL_TABLET | Freq: Four times a day (QID) | ORAL | Status: AC | PRN

## 2011-09-13 NOTE — Telephone Encounter (Signed)
[place] for [employer] and tramadol

## 2011-09-13 NOTE — Telephone Encounter (Signed)
Rx called in to pharmacy. Pt is aware.

## 2011-09-14 ENCOUNTER — Other Ambulatory Visit: Payer: 59 | Admitting: *Deleted

## 2011-09-14 ENCOUNTER — Ambulatory Visit (INDEPENDENT_AMBULATORY_CARE_PROVIDER_SITE_OTHER): Payer: 59 | Admitting: Cardiology

## 2011-09-14 ENCOUNTER — Encounter: Payer: Self-pay | Admitting: Cardiology

## 2011-09-14 VITALS — BP 130/80 | HR 72 | Ht 74.0 in | Wt 184.0 lb

## 2011-09-14 DIAGNOSIS — I119 Hypertensive heart disease without heart failure: Secondary | ICD-10-CM

## 2011-09-14 DIAGNOSIS — I480 Paroxysmal atrial fibrillation: Secondary | ICD-10-CM

## 2011-09-14 DIAGNOSIS — I4891 Unspecified atrial fibrillation: Secondary | ICD-10-CM

## 2011-09-14 DIAGNOSIS — R05 Cough: Secondary | ICD-10-CM

## 2011-09-14 DIAGNOSIS — R0789 Other chest pain: Secondary | ICD-10-CM | POA: Insufficient documentation

## 2011-09-14 DIAGNOSIS — R079 Chest pain, unspecified: Secondary | ICD-10-CM

## 2011-09-14 LAB — CBC WITH DIFFERENTIAL/PLATELET
Basophils Absolute: 0 10*3/uL (ref 0.0–0.1)
Basophils Relative: 0.3 % (ref 0.0–3.0)
Eosinophils Absolute: 0.3 10*3/uL (ref 0.0–0.7)
Eosinophils Relative: 2.9 % (ref 0.0–5.0)
HCT: 50 % (ref 39.0–52.0)
Hemoglobin: 17.1 g/dL — ABNORMAL HIGH (ref 13.0–17.0)
Lymphocytes Relative: 16.3 % (ref 12.0–46.0)
Lymphs Abs: 1.8 10*3/uL (ref 0.7–4.0)
MCHC: 34.2 g/dL (ref 30.0–36.0)
MCV: 84.9 fl (ref 78.0–100.0)
Monocytes Absolute: 1.1 10*3/uL — ABNORMAL HIGH (ref 0.1–1.0)
Monocytes Relative: 10 % (ref 3.0–12.0)
Neutro Abs: 7.6 10*3/uL (ref 1.4–7.7)
Neutrophils Relative %: 70.5 % (ref 43.0–77.0)
Platelets: 195 10*3/uL (ref 150.0–400.0)
RBC: 5.88 Mil/uL — ABNORMAL HIGH (ref 4.22–5.81)
RDW: 13.9 % (ref 11.5–14.6)
WBC: 10.8 10*3/uL — ABNORMAL HIGH (ref 4.5–10.5)

## 2011-09-14 LAB — BASIC METABOLIC PANEL
CO2: 28 mEq/L (ref 19–32)
Calcium: 9.2 mg/dL (ref 8.4–10.5)
Glucose, Bld: 92 mg/dL (ref 70–99)
Sodium: 139 mEq/L (ref 135–145)

## 2011-09-14 MED ORDER — FLECAINIDE ACETATE 100 MG PO TABS: ORAL_TABLET | ORAL | Status: DC

## 2011-09-14 NOTE — Patient Instructions (Signed)
Will obtain labs today and call you with the results  Flecainide 100 mg 3 tablets at onset of Atrial Fib has been sent to pharmacy  Your physician has requested that you have en exercise stress myoview. For further information please visit [email or web address]. Please follow instruction sheet, as given.  Your physician recommends that you schedule a follow-up appointment in: 3 months with fasting labs (lp/bmet/hfp)

## 2011-09-14 NOTE — Progress Notes (Signed)
Francis Campbell Date of Birth:  12-09-1945 [facility] Cardiology / [facility] [address]   [place], [place]  [address] [phone]           Fax   [phone]  History of Present Illness: This pleasant 65 year old gentleman is seen for a scheduled followup office visit.  He was hospitalized at [facility] over the Christmas weekend with dehydration and paroxysmal atrial fibrillation with a rapid ventricular response.  He also had a bad cough.  Is felt that the cough might be from losartan which was stopped 5 days ago.  He still has his cough however.  He has not run any fever.  White count on admission was 17,000 and by the next day was down to 12,000.  Current Outpatient Prescriptions  Medication Sig Dispense Refill  . aspirin 81 MG tablet Take 81 mg by mouth daily.        . Cholecalciferol (VITAMIN D3) 1000 UNITS CAPS Take 2 capsules by mouth daily.        [name] diltiazem (CARDIZEM LA) 240 MG 24 hr tablet Take 1 tablet (240 mg total) by mouth daily.  30 tablet  3  . guaiFENesin (MUCINEX) 600 MG 12 hr tablet Take 1 tablet (600 mg total) by mouth 2 (two) times daily as needed.  14 tablet  0  . hydrochlorothiazide (MICROZIDE) 12.5 MG capsule Take 1 capsule (12.5 mg total) by mouth daily.  30 capsule  3  . lovastatin (MEVACOR) 40 MG tablet Take 1 tablet (40 mg total) by mouth daily.  90 tablet  3  . metoprolol (TOPROL XL) 100 MG 24 hr tablet Take 1 tablet (100 mg total) by mouth daily.  30 tablet  3  . MULTIPLE VITAMIN PO Take by mouth daily.        . traMADol (ULTRAM) 50 MG tablet Take 1 tablet (50 mg total) by mouth every 6 (six) hours as needed for pain. Maximum dose= 8 tablets per day  30 tablet  0  . warfarin (COUMADIN) 5 MG tablet Take 5 mg by mouth daily. Take 5 mg on Tues, Thurs, and Sunday. Take 10 mg on Mon, wed, fri, and Sat.      . flecainide (TAMBOCOR) 100 MG tablet 3 tablets at onset of atrial fib  12 tablet  prn    Allergies  Allergen Reactions  .  Amiodarone     Abn  thyroid  . Floxin (Ofloxacin)     hallucinations  . Lisinopril Cough  . Tavist Allergy (Clemastine Fumarate)     A fib worse  . Robitussin Cf (Pseudoephedrine-Dm-Gg) Palpitations    Patient Active Problem List  Diagnoses  . Paroxysmal atrial fibrillation  . Hypercholesteremia  . Hypertension  . Aortic insufficiency  . Systolic murmur  . Cough, persistent  . Cough  . Leucocytosis  . Atrial fibrillation with RVR    History  Smoking status  . Never Smoker   Smokeless tobacco  . Not on file    History  Alcohol Use No    Family History  Problem Relation Age of Onset  . Stroke Mother   . Hypertension Mother   . Kidney disease Mother     stones  . Kidney disease Brother     stones  . Arthritis Paternal Grandmother   . Heart disease Paternal Grandmother     Review of Systems: Constitutional: no fever chills diaphoresis or fatigue or change in weight.  Head and neck: no hearing  loss, no epistaxis, no photophobia or visual disturbance. Respiratory: No cough, shortness of breath or wheezing. Cardiovascular: No chest pain peripheral edema, palpitations. Gastrointestinal: No abdominal distention, no abdominal pain, no change in bowel habits hematochezia or melena. Genitourinary: No dysuria, no frequency, no urgency, no nocturia. Musculoskeletal:No arthralgias, no back pain, no gait disturbance or myalgias. Neurological: No dizziness, no headaches, no numbness, no seizures, no syncope, no weakness, no tremors. Hematologic: No lymphadenopathy, no easy bruising. Psychiatric: No confusion, no hallucinations, no sleep disturbance.    Physical Exam: Filed Vitals:   09/14/11 0949  BP: 130/80  Pulse:    The general appearance reveals a well-developed well-nourished gentleman in no distress.Pupils equal and reactive.   Extraocular Movements are full.  There is no scleral icterus.  The mouth and pharynx are normal.  The neck is supple.  The carotids  reveal no bruits.  The jugular venous pressure is normal.  The thyroid is not enlarged.  There is no lymphadenopathy.  The chest is clear to percussion and auscultation. There are no rales or rhonchi. Expansion of the chest is symmetrical.  Heart reveals a faint systolic ejection murmur at the base.The abdomen is soft and nontender. Bowel sounds are normal. The liver and spleen are not enlarged. There Are no abdominal masses. There are no bruits.  The pedal pulses are good.  There is no phlebitis or edema.  There is no cyanosis or clubbing. Neurologic exam is within normal limits.The skin is warm and dry.  There is no rash.  EKG shows normal sinus rhythm at 63 per minute and is within normal limits  Assessment / Plan: Continue same medication.  Use flecainide 300 mg for paroxysmal AF as a "pill in the pocket" Return soon for a treadmill Myoview to evaluate chest pain with left arm radiation further.  Recheck in 3 months for followup office visit and fasting lab work.  Continue Coumadin per Coumadin clinic.  Darlin Coco

## 2011-09-14 NOTE — Assessment & Plan Note (Signed)
Recent chest x-ray was unremarkable.  He still has a nonproductive cough.  He uses tramadol one at bedtime to help with cough

## 2011-09-14 NOTE — Assessment & Plan Note (Signed)
The patient has been experiencing some burning substernal chest pain with left arm radiation.  He is concerned about it.  He had it when he was admitted to the hospital last weekend.  It has not recurred.  His last nuclear stress test was in November of 2008.  We are going to update his treadmill Myoview in the future and he will hold his Toprol and his diltiazem on the morning of the has her echocardiogram today shows normal sinus rhythm and is within normal limits at rest.

## 2011-09-14 NOTE — Assessment & Plan Note (Signed)
In the hospital he was treated with higher doses of metoprolol and diltiazem was added to his regimen.  He failed to convert until he took 300 mg of flecainide and he converted within several hours.  He was able to be discharged later that day.  We will give him a prescription for flecainide to use as a "pill in the pocket" and he will take 300 mg stat if his atrial fibrillation for more than 4 hours.

## 2011-09-21 ENCOUNTER — Telehealth: Payer: Self-pay | Admitting: Cardiology

## 2011-09-21 DIAGNOSIS — I119 Hypertensive heart disease without heart failure: Secondary | ICD-10-CM

## 2011-09-21 MED ORDER — AMLODIPINE BESYLATE 5 MG PO TABS: 5.0000 mg | ORAL_TABLET | Freq: Every day | ORAL | Status: DC

## 2011-09-21 NOTE — Telephone Encounter (Signed)
New Problem:     Patient came into the office a few weeks ago and is still having issues with his high blood pressure. Please advise.

## 2011-09-21 NOTE — Telephone Encounter (Signed)
blood pressure 130-160/95-115, heart rate 65-75.  No medication changes

## 2011-09-21 NOTE — Telephone Encounter (Signed)
Start amlodipine 5 mg one daily to help with high blood pressure.

## 2011-09-21 NOTE — Telephone Encounter (Signed)
Advised patient

## 2011-09-27 ENCOUNTER — Ambulatory Visit (HOSPITAL_COMMUNITY): Payer: 59 | Attending: Cardiology | Admitting: Radiology

## 2011-09-27 DIAGNOSIS — Z8673 Personal history of transient ischemic attack (TIA), and cerebral infarction without residual deficits: Secondary | ICD-10-CM | POA: Insufficient documentation

## 2011-09-27 DIAGNOSIS — R0989 Other specified symptoms and signs involving the circulatory and respiratory systems: Secondary | ICD-10-CM | POA: Insufficient documentation

## 2011-09-27 DIAGNOSIS — R05 Cough: Secondary | ICD-10-CM | POA: Insufficient documentation

## 2011-09-27 DIAGNOSIS — I119 Hypertensive heart disease without heart failure: Secondary | ICD-10-CM | POA: Insufficient documentation

## 2011-09-27 DIAGNOSIS — R0602 Shortness of breath: Secondary | ICD-10-CM | POA: Insufficient documentation

## 2011-09-27 DIAGNOSIS — R059 Cough, unspecified: Secondary | ICD-10-CM | POA: Insufficient documentation

## 2011-09-27 DIAGNOSIS — E785 Hyperlipidemia, unspecified: Secondary | ICD-10-CM | POA: Insufficient documentation

## 2011-09-27 DIAGNOSIS — R5381 Other malaise: Secondary | ICD-10-CM | POA: Insufficient documentation

## 2011-09-27 DIAGNOSIS — R079 Chest pain, unspecified: Secondary | ICD-10-CM | POA: Insufficient documentation

## 2011-09-27 DIAGNOSIS — R000 Tachycardia, unspecified: Secondary | ICD-10-CM | POA: Insufficient documentation

## 2011-09-27 DIAGNOSIS — R0609 Other forms of dyspnea: Secondary | ICD-10-CM | POA: Insufficient documentation

## 2011-09-27 DIAGNOSIS — R002 Palpitations: Secondary | ICD-10-CM | POA: Insufficient documentation

## 2011-09-27 DIAGNOSIS — I4891 Unspecified atrial fibrillation: Secondary | ICD-10-CM

## 2011-09-27 MED ORDER — TECHNETIUM TC 99M TETROFOSMIN IV KIT
11.0000 | PACK | Freq: Once | INTRAVENOUS | Status: AC | PRN
  Administered 2011-09-27: 11 via INTRAVENOUS

## 2011-09-27 MED ORDER — TECHNETIUM TC 99M TETROFOSMIN IV KIT
33.0000 | PACK | Freq: Once | INTRAVENOUS | Status: AC | PRN
  Administered 2011-09-27: 33 via INTRAVENOUS

## 2011-09-27 NOTE — Progress Notes (Signed)
[place] [address] [phone]  Cardiology Nuclear Med Study  Xzavion Doswell is a 66 y.o. male [id number] 1946-09-13   Nuclear Med Background Indication for Stress Test:  Evaluation for Ischemia and 09/07/11 Post Hospital: PAF with RVR that converted with flecainide, cough with increase white count and dehydration, and elevated BP History: 7/10 Echo: EF:55-60% mod. AI mild LVH, 07/25/07 Myocardial Perfusion Study: EF: 67%  and AFIB, TIA: many years ago Cardiac Risk Factors: Hypertension and Lipids  Symptoms:  Chest Pain, DOE, Fatigue, Palpitations, Rapid HR and SOB   Nuclear Pre-Procedure Caffeine/Decaff Intake:  None NPO After: 7:30pm   Lungs:  clear IV 0.9% NS with Angio Cath:  20g  IV Site: R Antecubital x 1, tolerated well IV Started by:  Irven Baltimore, RN  Chest Size (in):  38 Cup Size: n/a  Height: 6' 2"  (1.88 m)  Weight:  184 lb (83.462 kg)  BMI:  Body mass index is 23.62 kg/(m^2). Tech Comments:  Held metoprolol and cardizem x 24 hrs    Nuclear Med Study 1 or 2 day study: 1 day  Stress Test Type:  Stress  Reading MD: Darlin Coco, MD  Order Authorizing Provider:  Darlin Coco, MD  Resting Radionuclide: Technetium 74mTetrofosmin  Resting Radionuclide Dose: 11 mCi   Stress Radionuclide:  Technetium 973metrofosmin  Stress Radionuclide Dose: 33 mCi           Stress Protocol Rest HR: 58 Stress HR: 139  Rest BP: 123/76 Stress BP: 177/76  Exercise Time (min): 8:00 METS: 10.10   Predicted Max HR: 156 bpm % Max HR: 89.1 bpm Rate Pressure Product: 2400938 Dose of Adenosine (mg):  n/a Dose of Lexiscan: n/a mg  Dose of Atropine (mg): n/a Dose of Dobutamine: n/a mcg/kg/min (at max HR)  Stress Test Technologist: SaPerrin MalteseEMT-P  Nuclear Technologist:  StCharlton AmorCNMT     Rest Procedure:  Myocardial perfusion imaging was performed at rest 45 minutes following the intravenous  administration of Technetium 996mtrofosmin. Rest ECG: Sinus Bradycardia with PVCS  Stress Procedure:  The patient exercised for 8:00.  The patient stopped due to fatigue and denied any chest pain.  There were no significant ST-T wave changes and freq pacs/pvcs.  Technetium 35m11mrofosmin was injected at peak exercise and myocardial perfusion imaging was performed after a brief delay. Stress ECG: No significant change from baseline ECG  QPS Raw Data Images:  Normal; no motion artifact; normal heart/lung ratio. Stress Images:  Normal homogeneous uptake in all areas of the myocardium. Rest Images:  Normal homogeneous uptake in all areas of the myocardium. Subtraction (SDS):  No evidence of ischemia. Transient Ischemic Dilatation (Normal <1.22):  .92 Lung/Heart Ratio (Normal <0.45):  .26  Quantitative Gated Spect Images QGS EDV:  112 ml QGS ESV:  42 ml QGS cine images:  NL LV Function; NL Wall Motion QGS EF: 62%  Impression Exercise Capacity:  Good exercise capacity. BP Response:  Normal blood pressure response. Clinical Symptoms:  No chest pain. ECG Impression:  No significant ST segment change suggestive of ischemia. Comparison with Prior Nuclear Study: No significant change from previous study  Overall Impression:  Normal stress nuclear study. Good exercise tolerance.  No ischemia. Normal LV systolic function. EF 62%.   ThomDarlin Coco

## 2011-09-28 ENCOUNTER — Telehealth: Payer: Self-pay | Admitting: *Deleted

## 2011-09-28 NOTE — Telephone Encounter (Signed)
Message copied by Earvin Hansen on Fri Sep 28, 2011  5:19 PM ------      Message from: Darlin Coco      Created: Fri Sep 28, 2011  1:39 PM       Please report. Stress  Test was normal.  Okay to resume vigorous exercise.

## 2011-09-28 NOTE — Telephone Encounter (Signed)
Advised of stress test results.  Patient c/o having discomfort in center of stomach between lower rib cage and bell button.  Has had decrease bowel movements, but not really constipated.  Has not seen GI in about 20 years.  Will forward to  [name] for review

## 2011-10-01 NOTE — Telephone Encounter (Signed)
If symptoms are persisting I would suggest that he see his gastroenterologist of choice.

## 2011-10-01 NOTE — Telephone Encounter (Signed)
Left message

## 2011-10-02 ENCOUNTER — Telehealth: Payer: Self-pay | Admitting: Cardiology

## 2011-10-02 ENCOUNTER — Encounter: Payer: Self-pay | Admitting: Gastroenterology

## 2011-10-02 NOTE — Telephone Encounter (Signed)
Patient called back.  Gave him  [name] suggestion and he will call Dr Sharlett Iles this afternoon and call back and let us know what they say

## 2011-10-02 NOTE — Telephone Encounter (Deleted)
F/U   Patient returning call to Nurse MP.  Patient can be reached on mobile [phone]

## 2011-10-03 NOTE — Telephone Encounter (Signed)
F/U  Patient returning call to Nurse MP. Patient can be reached on mobile [phone]

## 2011-10-03 NOTE — Telephone Encounter (Signed)
Spoke with patient and he has an appointment with Dr Sharlett Iles on Feb 4

## 2011-10-09 ENCOUNTER — Telehealth: Payer: Self-pay | Admitting: Cardiology

## 2011-10-09 NOTE — Telephone Encounter (Signed)
Patient needs appointment for blood work (INR & BMET), scheduled.  Also was wanting to bring in blood pressure readings for  [name] to review.

## 2011-10-09 NOTE — Telephone Encounter (Signed)
New Problem:     Patient would like for you to give him a call back about some of his next appointments. Please call back.

## 2011-10-10 ENCOUNTER — Ambulatory Visit (INDEPENDENT_AMBULATORY_CARE_PROVIDER_SITE_OTHER): Payer: 59 | Admitting: *Deleted

## 2011-10-10 ENCOUNTER — Telehealth: Payer: Self-pay | Admitting: *Deleted

## 2011-10-10 ENCOUNTER — Other Ambulatory Visit (INDEPENDENT_AMBULATORY_CARE_PROVIDER_SITE_OTHER): Payer: 59 | Admitting: *Deleted

## 2011-10-10 DIAGNOSIS — E876 Hypokalemia: Secondary | ICD-10-CM

## 2011-10-10 DIAGNOSIS — I119 Hypertensive heart disease without heart failure: Secondary | ICD-10-CM

## 2011-10-10 DIAGNOSIS — Z7901 Long term (current) use of anticoagulants: Secondary | ICD-10-CM

## 2011-10-10 DIAGNOSIS — I4891 Unspecified atrial fibrillation: Secondary | ICD-10-CM

## 2011-10-10 DIAGNOSIS — I480 Paroxysmal atrial fibrillation: Secondary | ICD-10-CM

## 2011-10-10 LAB — HEPATIC FUNCTION PANEL
Albumin: 4.2 g/dL (ref 3.5–5.2)
Total Protein: 7 g/dL (ref 6.0–8.3)

## 2011-10-10 LAB — LIPID PANEL
Cholesterol: 193 mg/dL (ref 0–200)
Total CHOL/HDL Ratio: 5
Triglycerides: 298 mg/dL — ABNORMAL HIGH (ref 0.0–149.0)
VLDL: 59.6 mg/dL — ABNORMAL HIGH (ref 0.0–40.0)

## 2011-10-10 LAB — BASIC METABOLIC PANEL
Chloride: 102 mEq/L (ref 96–112)
Potassium: 3.3 mEq/L — ABNORMAL LOW (ref 3.5–5.1)

## 2011-10-10 MED ORDER — AMLODIPINE BESYLATE 10 MG PO TABS: 10.0000 mg | ORAL_TABLET | Freq: Every day | ORAL | Status: DC

## 2011-10-10 MED ORDER — POTASSIUM CHLORIDE CRYS ER 20 MEQ PO TBCR: 20.0000 meq | EXTENDED_RELEASE_TABLET | Freq: Every day | ORAL | Status: DC | PRN

## 2011-10-10 NOTE — Telephone Encounter (Signed)
Advised of labs and medication changes

## 2011-10-10 NOTE — Telephone Encounter (Signed)
Message copied by Earvin Hansen on Wed Oct 10, 2011  5:18 PM ------      Message from: Darlin Coco      Created: Wed Oct 10, 2011  5:16 PM       Start K. Dur 20 mEq one daily.      His cholesterol was still high and he needs to be more careful with diet and continue lovastatin 40 mg daily

## 2011-10-10 NOTE — Telephone Encounter (Signed)
Patient brought in blood pressure readings and they are ranging from 120's-150's/80's-100's.  Will have him increase his Amlodipine to 10 mg daily.  Advised patient

## 2011-10-17 ENCOUNTER — Encounter: Payer: Self-pay | Admitting: *Deleted

## 2011-10-18 ENCOUNTER — Telehealth: Payer: Self-pay | Admitting: Cardiology

## 2011-10-18 NOTE — Telephone Encounter (Signed)
Advised patient

## 2011-10-18 NOTE — Telephone Encounter (Signed)
Pt calling re new meds ankles and feet are swelling about two days ago

## 2011-10-18 NOTE — Telephone Encounter (Signed)
Takes Amlodipine 10 mg daily, please advise

## 2011-10-18 NOTE — Telephone Encounter (Signed)
The 10 mg amlodipine is probably the cause of his new edema.  He can cut back on his amlodipine to 5 mg.  If his BP starts to rise he should increase his HCTZ to 25 mg daily.

## 2011-10-19 ENCOUNTER — Encounter: Payer: Self-pay | Admitting: *Deleted

## 2011-10-22 ENCOUNTER — Telehealth: Payer: Self-pay | Admitting: Cardiology

## 2011-10-22 ENCOUNTER — Ambulatory Visit (INDEPENDENT_AMBULATORY_CARE_PROVIDER_SITE_OTHER): Payer: 59 | Admitting: Gastroenterology

## 2011-10-22 ENCOUNTER — Encounter: Payer: Self-pay | Admitting: Gastroenterology

## 2011-10-22 DIAGNOSIS — Z7901 Long term (current) use of anticoagulants: Secondary | ICD-10-CM

## 2011-10-22 DIAGNOSIS — R109 Unspecified abdominal pain: Secondary | ICD-10-CM | POA: Insufficient documentation

## 2011-10-22 DIAGNOSIS — Z5181 Encounter for therapeutic drug level monitoring: Secondary | ICD-10-CM

## 2011-10-22 DIAGNOSIS — I4891 Unspecified atrial fibrillation: Secondary | ICD-10-CM

## 2011-10-22 DIAGNOSIS — K5090 Crohn's disease, unspecified, without complications: Secondary | ICD-10-CM | POA: Insufficient documentation

## 2011-10-22 MED ORDER — PEG-KCL-NACL-NASULF-NA ASC-C 100 G PO SOLR: 1.0000 | Freq: Once | ORAL | Status: DC

## 2011-10-22 MED ORDER — ESOMEPRAZOLE MAGNESIUM 40 MG PO CPDR: 40.0000 mg | DELAYED_RELEASE_CAPSULE | Freq: Every day | ORAL | Status: DC

## 2011-10-22 NOTE — Telephone Encounter (Signed)
Advised patient.  For cough, ok to try Mucinex plain

## 2011-10-22 NOTE — Progress Notes (Signed)
This is a 66 year old Caucasian male former patient of mine have not seen in over 10 years. 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 Last colonoscopy and endoscopy were over 10 years ago. He currently is asymptomatic except for some vague substernal discomfort which he describes as a dull pain without relationship to meals or other precipitating or alleviating elements. He specifically denies regurgitation, dysphagia, had treadmill or complaints, or any history of hepatitis or pancreatitis. He does not smoke, abuse NSAIDs, or alcohol. Recently he was hospitalized because of atrial fibrillation with rapid ventricular response. He is chronically anticoagulated and followed by [name]. He has had physical exam by Dr. Elease Hashimoto which apparently was unremarkable.  Current Medications, Allergies, Past Medical History, Past Surgical History, Family History and Social History were reviewed in [employer] record.  Pertinent Review of Systems Negative... no current cardiovascular or pulmonary complaints. 16 point review of system otherwise negative.   Physical Exam: The appearing male in no distress appearing his stated age. I cannot appreciate stigmata of chronic liver disease. His chest is clear, he appears to be in a regular rhythm without significant murmurs gallops or rubs. I cannot appreciate organomegaly, abdominal masses, tenderness, or abnormal bowel sounds. Mental status is normal, and peripheral extremities are unremarkable.    Assessment and Plan: Probable atypical acid reflux symptoms. I have started AcipHex 20 mg a day with anti-reflux maneuvers, and we'll check followup colonoscopy and endoscopy as it is been over 10 years since these procedures were performed. Certainly does not sound like he is having active inflammatory bowel disease problems at this time. Review of his radiographs do show previous abnormal pulmonary nodules  which apparently have been evaluated, biopsy, and were not malignant. Also he is followed by urology per recurrent kidney stones and an abnormal nodule on his left kidney. I will hold his Coumadin 5 days before his procedures unless otherwise advised by [name]. Otherwise patient is to continue his medications as listed and reviewed in his record. Encounter Diagnoses  Name Primary?  . Abdominal pain   . Crohn disease   . Anticoagulated on Coumadin

## 2011-10-22 NOTE — Telephone Encounter (Signed)
Beta blockers will cause some people to feel cold.

## 2011-10-22 NOTE — Telephone Encounter (Signed)
Since getting out of the hospital gets cold and will start shaking.  When he gets cold and shaking, no one around him cold.  Started getting a cough at times (several times an hour).  Please advise

## 2011-10-22 NOTE — Telephone Encounter (Signed)
Pt cold and can't get warm, is BP too low? 128/64 this am by pcp, denies dizziness and lightheadedness,  pt [phone]

## 2011-10-22 NOTE — Patient Instructions (Signed)
Your procedure has been scheduled for 11/07/2011, please follow the seperate instructions.  Your prescription(s) have been sent to you pharmacy, Nexium and Movi Prep Take Nexium once a day 30 min before breakfast. We will call you about holding your Coumadin once we hear from Dr Mare Ferrari.    Diet for GERD or PUD Nutrition therapy can help ease the discomfort of gastroesophageal reflux disease (GERD) and peptic ulcer disease (PUD).  HOME CARE INSTRUCTIONS   Eat your meals slowly, in a relaxed setting.   Eat 5 to 6 small meals per day.   If a food causes distress, stop eating it for a period of time.  FOODS TO AVOID  Coffee, regular or decaffeinated.   Cola beverages, regular or low calorie.   Tea, regular or decaffeinated.   Pepper.   Cocoa.   High fat foods, including meats.   Butter, margarine, hydrogenated oil (trans fats).   Peppermint or spearmint (if you have GERD).   Fruits and vegetables if not tolerated.   Alcohol.   Nicotine (smoking or chewing). This is one of the most potent stimulants to acid production in the gastrointestinal tract.   Any food that seems to aggravate your condition.  If you have questions regarding your diet, ask your caregiver or a registered dietitian. TIPS  Lying flat may make symptoms worse. Keep the head of your bed raised 6 to 9 inches (15 to 23 cm) by using a foam wedge or blocks under the legs of the bed.   Do not lay down until 3 hours after eating a meal.   Daily physical activity may help reduce symptoms.  MAKE SURE YOU:   Understand these instructions.   Will watch your condition.   Will get help right away if you are not doing well or get worse.  Document Released: 09/03/2005 Document Revised: 05/16/2011 Document Reviewed: 01/17/2009 [facility] Patient Information 2012 [place].

## 2011-10-23 ENCOUNTER — Encounter: Payer: Self-pay | Admitting: Gastroenterology

## 2011-10-24 ENCOUNTER — Telehealth: Payer: Self-pay | Admitting: Cardiology

## 2011-10-24 DIAGNOSIS — R600 Localized edema: Secondary | ICD-10-CM

## 2011-10-24 DIAGNOSIS — R05 Cough: Secondary | ICD-10-CM

## 2011-10-24 DIAGNOSIS — R5383 Other fatigue: Secondary | ICD-10-CM

## 2011-10-24 MED ORDER — FUROSEMIDE 40 MG PO TABS: 40.0000 mg | ORAL_TABLET | Freq: Every day | ORAL | Status: DC

## 2011-10-24 NOTE — Telephone Encounter (Signed)
Advised patient

## 2011-10-24 NOTE — Telephone Encounter (Signed)
Stop HCTZ and begin Lasix 40 mg daily to help with fluid. Eat potassium rich foods Get chest xray. Update his 2D echo.  Hx of aortic valve disease. Get CBC B-NP BMET HFP to evaluate swelling and fatigue.

## 2011-10-24 NOTE — Telephone Encounter (Signed)
F/U  Patient calling complaining of water retention in his legs ankle and feet.  Patient has gained 6 pounds in the last week.  Please return call at [phone].  At this point it is not clear if the wife has or wishes to communicate the concerns she expressed earlier regarding patient as he never referenced any of the other phone calls.

## 2011-10-24 NOTE — Telephone Encounter (Signed)
New problem Pt's wife called. He has laryngitis and also his legs are swelling below the knee. Please call her back

## 2011-10-24 NOTE — Telephone Encounter (Signed)
Increased fatigue, cough started about a week ago.  Wife very concerned about swelling and fluid

## 2011-10-25 ENCOUNTER — Other Ambulatory Visit: Payer: Self-pay | Admitting: *Deleted

## 2011-10-25 ENCOUNTER — Ambulatory Visit (INDEPENDENT_AMBULATORY_CARE_PROVIDER_SITE_OTHER): Payer: 59 | Admitting: *Deleted

## 2011-10-25 ENCOUNTER — Ambulatory Visit (INDEPENDENT_AMBULATORY_CARE_PROVIDER_SITE_OTHER)
Admission: RE | Admit: 2011-10-25 | Discharge: 2011-10-25 | Disposition: A | Discharge status: Home / Self Care | Payer: 59 | Source: Ambulatory Visit | Attending: Cardiology | Admitting: Cardiology

## 2011-10-25 DIAGNOSIS — R5381 Other malaise: Secondary | ICD-10-CM

## 2011-10-25 DIAGNOSIS — I480 Paroxysmal atrial fibrillation: Secondary | ICD-10-CM

## 2011-10-25 DIAGNOSIS — R5383 Other fatigue: Secondary | ICD-10-CM

## 2011-10-25 DIAGNOSIS — R05 Cough: Secondary | ICD-10-CM

## 2011-10-25 DIAGNOSIS — I10 Essential (primary) hypertension: Secondary | ICD-10-CM

## 2011-10-25 DIAGNOSIS — R609 Edema, unspecified: Secondary | ICD-10-CM

## 2011-10-25 DIAGNOSIS — I351 Nonrheumatic aortic (valve) insufficiency: Secondary | ICD-10-CM

## 2011-10-25 DIAGNOSIS — E7800 Pure hypercholesterolemia, unspecified: Secondary | ICD-10-CM

## 2011-10-25 DIAGNOSIS — R600 Localized edema: Secondary | ICD-10-CM

## 2011-10-25 DIAGNOSIS — I359 Nonrheumatic aortic valve disorder, unspecified: Secondary | ICD-10-CM

## 2011-10-25 DIAGNOSIS — I4891 Unspecified atrial fibrillation: Secondary | ICD-10-CM

## 2011-10-25 LAB — CBC WITH DIFFERENTIAL/PLATELET
Basophils Relative: 0.5 % (ref 0.0–3.0)
Eosinophils Relative: 3.4 % (ref 0.0–5.0)
HCT: 44.3 % (ref 39.0–52.0)
Hemoglobin: 14.8 g/dL (ref 13.0–17.0)
Lymphs Abs: 1.1 10*3/uL (ref 0.7–4.0)
MCV: 86.1 fl (ref 78.0–100.0)
Monocytes Absolute: 0.8 10*3/uL (ref 0.1–1.0)
Monocytes Relative: 7.7 % (ref 3.0–12.0)
Neutro Abs: 8.7 10*3/uL — ABNORMAL HIGH (ref 1.4–7.7)
Platelets: 200 10*3/uL (ref 150.0–400.0)
WBC: 11.1 10*3/uL — ABNORMAL HIGH (ref 4.5–10.5)

## 2011-10-25 LAB — BASIC METABOLIC PANEL
BUN: 22 mg/dL (ref 6–23)
Chloride: 104 mEq/L (ref 96–112)
GFR: 77.14 mL/min (ref >60.00)
Potassium: 3.3 mEq/L — ABNORMAL LOW (ref 3.5–5.1)
Sodium: 142 mEq/L (ref 135–145)

## 2011-10-25 LAB — BRAIN NATRIURETIC PEPTIDE: Pro B Natriuretic peptide (BNP): 28 pg/mL (ref 0.0–100.0)

## 2011-10-25 MED ORDER — AMLODIPINE BESYLATE 5 MG PO TABS: 5.0000 mg | ORAL_TABLET | Freq: Every day | ORAL | Status: DC

## 2011-10-25 NOTE — Telephone Encounter (Signed)
Refilled amlodipine for 90 days

## 2011-10-26 ENCOUNTER — Telehealth: Payer: Self-pay | Admitting: *Deleted

## 2011-10-26 ENCOUNTER — Other Ambulatory Visit: Payer: Self-pay | Admitting: *Deleted

## 2011-10-26 DIAGNOSIS — E876 Hypokalemia: Secondary | ICD-10-CM

## 2011-10-26 MED ORDER — POTASSIUM CHLORIDE CRYS ER 20 MEQ PO TBCR: 20.0000 meq | EXTENDED_RELEASE_TABLET | Freq: Every day | ORAL | Status: DC | PRN

## 2011-10-26 NOTE — Telephone Encounter (Signed)
Message copied by Earvin Hansen on Fri Oct 26, 2011  3:37 PM ------      Message from: Darlin Coco      Created: Fri Oct 26, 2011  2:36 PM       Yes start [place] 20 once a day

## 2011-10-26 NOTE — Telephone Encounter (Signed)
Message copied by Earvin Hansen on Fri Oct 26, 2011  3:40 PM ------      Message from: Darlin Coco      Created: Fri Oct 26, 2011  2:36 PM       Yes start [place] 20 once a day

## 2011-10-26 NOTE — Telephone Encounter (Signed)
Message copied by Earvin Hansen on Fri Oct 26, 2011  2:23 PM ------      Message from: Darlin Coco      Created: Thu Oct 25, 2011  8:11 PM       Please report. Chest xray is okay. No active disease.

## 2011-10-26 NOTE — Telephone Encounter (Signed)
Message copied by Earvin Hansen on Fri Oct 26, 2011  3:40 PM ------      Message from: Darlin Coco      Created: Thu Oct 25, 2011  8:15 PM       Please report.  The blood tests show borderline high WBC.  Is he having any fever or purulent sputum or other symptoms of infection?            The potassium is still low.  Increase KDur 20 to one TID

## 2011-10-26 NOTE — Telephone Encounter (Signed)
Advised 

## 2011-10-26 NOTE — Telephone Encounter (Signed)
Patient never started K+ Rx strength just OTC k+.  Will send to pharmacy again

## 2011-10-29 ENCOUNTER — Telehealth: Payer: Self-pay | Admitting: *Deleted

## 2011-10-29 MED ORDER — PANTOPRAZOLE SODIUM 40 MG PO TBEC: 40.0000 mg | DELAYED_RELEASE_TABLET | Freq: Every day | ORAL | Status: DC

## 2011-10-29 NOTE — Telephone Encounter (Signed)
10/22/2011    RE: Francis Campbell Tutor DOB: 1946-01-12 MRN: [id number]   Dear Dr Mare Ferrari,    We have scheduled the above patient for an endoscopic procedure with Dr Verl Blalock. Our records show that he is on anticoagulation therapy. Dr Sharlett Iles would like to hold Mr Lisenbee Coumadin for 5 days.   Please advise as to how long the patient may come off his therapy of Coumadin prior to the procedure, which is scheduled for 11/07/2011.  Please fax back  the completed form to [facility] [address] [place] ([place]) at [phone].     Sincerely, Bernita Buffy, CMA(AAMA)

## 2011-10-30 NOTE — Telephone Encounter (Signed)
The patient should take his last dose of Coumadin on February 15 and then hold until after endoscopy.

## 2011-10-30 NOTE — Telephone Encounter (Signed)
Pt advised.

## 2011-10-31 ENCOUNTER — Other Ambulatory Visit: Payer: Self-pay | Admitting: *Deleted

## 2011-10-31 ENCOUNTER — Telehealth: Payer: Self-pay | Admitting: Cardiology

## 2011-10-31 DIAGNOSIS — F329 Major depressive disorder, single episode, unspecified: Secondary | ICD-10-CM

## 2011-10-31 DIAGNOSIS — E876 Hypokalemia: Secondary | ICD-10-CM

## 2011-10-31 MED ORDER — SERTRALINE HCL 50 MG PO TABS: 50.0000 mg | ORAL_TABLET | Freq: Every day | ORAL | Status: DC

## 2011-10-31 MED ORDER — AMLODIPINE BESYLATE 5 MG PO TABS: 5.0000 mg | ORAL_TABLET | Freq: Every day | ORAL | Status: DC

## 2011-10-31 NOTE — Telephone Encounter (Signed)
Advised patient

## 2011-10-31 NOTE — Telephone Encounter (Signed)
I would like him to start citalopram 20 mg one daily for depression

## 2011-10-31 NOTE — Telephone Encounter (Signed)
Do not proceed with celexa. Try Zoloft 50 mg one daily [if it makes him sleepy he can take it at night.  Otherwise he can take it in the morning.]

## 2011-10-31 NOTE — Telephone Encounter (Signed)
FU Call: pt retuning call from [place]. Please call back.

## 2011-10-31 NOTE — Telephone Encounter (Signed)
Patient feeling very depressed.  States he does not want to get out of bed, go to work,and just wants to go home and get back in bed and pull covers over his head.  Will forward to  [name] for reveiw

## 2011-10-31 NOTE — Telephone Encounter (Signed)
Refilled amlodipine 

## 2011-10-31 NOTE — Telephone Encounter (Signed)
Celexa and Flecainide not recommended secondary to QT prolonging---will forward to  [name] for review  Advised patient will Rx.  Also concerned about potassium level, will recheck tomorrow.

## 2011-10-31 NOTE — Telephone Encounter (Signed)
New Problem   Patient would like a return call at [phone] regarding echo test and medications

## 2011-10-31 NOTE — Telephone Encounter (Signed)
Left message

## 2011-11-01 ENCOUNTER — Telehealth: Payer: Self-pay | Admitting: Cardiology

## 2011-11-01 ENCOUNTER — Other Ambulatory Visit: Payer: Self-pay

## 2011-11-01 ENCOUNTER — Other Ambulatory Visit (INDEPENDENT_AMBULATORY_CARE_PROVIDER_SITE_OTHER): Payer: 59 | Admitting: *Deleted

## 2011-11-01 ENCOUNTER — Ambulatory Visit (HOSPITAL_COMMUNITY): Payer: 59 | Attending: Cardiology | Admitting: Radiology

## 2011-11-01 DIAGNOSIS — R5383 Other fatigue: Secondary | ICD-10-CM

## 2011-11-01 DIAGNOSIS — R600 Localized edema: Secondary | ICD-10-CM

## 2011-11-01 DIAGNOSIS — R5381 Other malaise: Secondary | ICD-10-CM | POA: Insufficient documentation

## 2011-11-01 DIAGNOSIS — E876 Hypokalemia: Secondary | ICD-10-CM

## 2011-11-01 DIAGNOSIS — I10 Essential (primary) hypertension: Secondary | ICD-10-CM | POA: Insufficient documentation

## 2011-11-01 DIAGNOSIS — R079 Chest pain, unspecified: Secondary | ICD-10-CM | POA: Insufficient documentation

## 2011-11-01 DIAGNOSIS — R05 Cough: Secondary | ICD-10-CM

## 2011-11-01 DIAGNOSIS — E7800 Pure hypercholesterolemia, unspecified: Secondary | ICD-10-CM | POA: Insufficient documentation

## 2011-11-01 DIAGNOSIS — I359 Nonrheumatic aortic valve disorder, unspecified: Secondary | ICD-10-CM

## 2011-11-01 DIAGNOSIS — Q231 Congenital insufficiency of aortic valve: Secondary | ICD-10-CM | POA: Insufficient documentation

## 2011-11-01 DIAGNOSIS — R609 Edema, unspecified: Secondary | ICD-10-CM | POA: Insufficient documentation

## 2011-11-01 DIAGNOSIS — I4891 Unspecified atrial fibrillation: Secondary | ICD-10-CM | POA: Insufficient documentation

## 2011-11-01 LAB — BASIC METABOLIC PANEL
BUN: 22 mg/dL (ref 6–23)
CO2: 30 mEq/L (ref 19–32)
Calcium: 9.6 mg/dL (ref 8.4–10.5)
Chloride: 101 mEq/L (ref 96–112)
Creatinine, Ser: 1 mg/dL (ref 0.4–1.5)
Glucose, Bld: 120 mg/dL — ABNORMAL HIGH (ref 70–99)

## 2011-11-01 NOTE — Telephone Encounter (Signed)
Very depressed, just started Zoloft.  Wife states he had bad stomach pain last night and concerned coming from potassium or other medications.  Is scheduled for colonoscopy and endoscopy around 2/20. Also he is not eating   Will forward to  [name] for review

## 2011-11-01 NOTE — Telephone Encounter (Signed)
New msg Pt's wife wanted to talk to you about tests he had done. Please call her back

## 2011-11-01 NOTE — Telephone Encounter (Signed)
Advised wife and scheduled INR for tomorrow since patient not eating well

## 2011-11-01 NOTE — Telephone Encounter (Signed)
The potassium may be bothering his stomach.  Let us have him stop his potassium tablets now and see if that makes a difference.  Decrease his furosemide to just half of a tablet every other day and supplement his potassium with natural sources such as bananas.

## 2011-11-02 ENCOUNTER — Telehealth: Payer: Self-pay | Admitting: *Deleted

## 2011-11-02 ENCOUNTER — Other Ambulatory Visit: Payer: Self-pay | Admitting: *Deleted

## 2011-11-02 ENCOUNTER — Telehealth: Payer: Self-pay | Admitting: Cardiology

## 2011-11-02 ENCOUNTER — Encounter: Payer: 59 | Admitting: *Deleted

## 2011-11-02 NOTE — Telephone Encounter (Signed)
Message copied by Earvin Hansen on Fri Nov 02, 2011  2:38 PM ------      Message from: Darlin Coco      Created: Thu Nov 01, 2011  9:05 PM       Please report.  The echo report is good.  EF 60%  The aortic valve disease is very mild.

## 2011-11-02 NOTE — Telephone Encounter (Signed)
New msg Pt wants to know echo results

## 2011-11-02 NOTE — Telephone Encounter (Signed)
Advised wife of labs 

## 2011-11-02 NOTE — Telephone Encounter (Signed)
Message copied by Earvin Hansen on Fri Nov 02, 2011  2:39 PM ------      Message from: Darlin Coco      Created: Thu Nov 01, 2011  9:02 PM       Changes as previously discussed

## 2011-11-02 NOTE — Telephone Encounter (Signed)
Advised wife of echo and lab results

## 2011-11-02 NOTE — Telephone Encounter (Signed)
Advised of results

## 2011-11-05 ENCOUNTER — Telehealth: Payer: Self-pay | Admitting: Cardiology

## 2011-11-05 NOTE — Telephone Encounter (Signed)
Patient stated he was no longer having any stomach issues however he has no appetite.  Advised he should proceed with scheduled endoscopy and colonoscopy.  Discussed with  [name] and he agreed.  Also for continuing depression advised to contact his PCP Dr. Elease Hashimoto.  Stated he would call there office for an appointmnet

## 2011-11-05 NOTE — Telephone Encounter (Signed)
Left message

## 2011-11-05 NOTE — Telephone Encounter (Signed)
Pt has questions about test he has coming up Wednesday

## 2011-11-05 NOTE — Telephone Encounter (Signed)
New msg Pt's wife called She wants to talk to you about the procedure he is to have done. Please call her back

## 2011-11-05 NOTE — Telephone Encounter (Signed)
Fu form previous msg: Pt's wife having test coming up on Wednesday , not sure that pt up to it and wants to cxl if needed before to late, pls call pt's wife [phone] Ivin Booty

## 2011-11-06 ENCOUNTER — Encounter: Payer: 59 | Admitting: *Deleted

## 2011-11-06 ENCOUNTER — Ambulatory Visit (INDEPENDENT_AMBULATORY_CARE_PROVIDER_SITE_OTHER): Payer: 59 | Admitting: Family Medicine

## 2011-11-06 ENCOUNTER — Encounter: Payer: Self-pay | Admitting: Family Medicine

## 2011-11-06 DIAGNOSIS — F329 Major depressive disorder, single episode, unspecified: Secondary | ICD-10-CM

## 2011-11-06 NOTE — Progress Notes (Signed)
  Subjective:    Patient ID: Francis Campbell, male    DOB: Mar 03, 1946, 66 y.o.   MRN: [id number]  HPI  Patient seen with new problem of increased depression symptoms. Patient noted a few weeks ago including somnolence, increased fatigue, decreased motivation, decreased appetite, and sleep disturbance. No suicidal ideation. Was seen by cardiologist and started on Zoloft 50 mg daily about one week ago. No side effects. Thus far as not seeing much improvement. He had normal TSH last December. No prior history of depression. Does take beta blocker but has been on this for quite some time. No family history of depression. His medical problems include history of atrial fibrillation, hyperlipidemia, hypertension, Crohn's disease. He is scheduled for upper and lower endoscopy tomorrow. He's lost about 8 pounds since November. He has been eating small amounts past few days. Had recent mild hypokalemia and started on potassium replacement per cardiology. Recent basic metabolic panel 5 days ago normal    Review of Systems  Constitutional: Positive for appetite change. Negative for fever and chills.  Respiratory: Negative for shortness of breath.   Cardiovascular: Negative for chest pain.  Gastrointestinal: Negative for abdominal pain.  Psychiatric/Behavioral: Positive for dysphoric mood. Negative for suicidal ideas, confusion and agitation. The patient is nervous/anxious.        Objective:   Physical Exam  Constitutional: He appears well-developed and well-nourished.  Cardiovascular: Normal rate and regular rhythm.   Pulmonary/Chest: Effort normal and breath sounds normal. No respiratory distress. He has no wheezes. He has no rales.  Psychiatric: His behavior is normal. Judgment and thought content normal.       Depressed mood. Appropriate affect          Assessment & Plan:  Maj. depressive episode. Continue Zoloft 50 mg daily. We talked about other things like counseling and he was given a  couple of names. We reiterated that medication may take a few weeks to see significant effect.  Reassess in 3 weeks and consider titration of medication or additional medication at that time if indicated

## 2011-11-06 NOTE — Telephone Encounter (Signed)
Left message

## 2011-11-07 ENCOUNTER — Encounter: Payer: 59 | Admitting: *Deleted

## 2011-11-07 ENCOUNTER — Ambulatory Visit (AMBULATORY_SURGERY_CENTER): Payer: 59 | Admitting: Gastroenterology

## 2011-11-07 ENCOUNTER — Other Ambulatory Visit: Payer: Self-pay | Admitting: Gastroenterology

## 2011-11-07 ENCOUNTER — Encounter: Payer: Self-pay | Admitting: Gastroenterology

## 2011-11-07 DIAGNOSIS — K2980 Duodenitis without bleeding: Secondary | ICD-10-CM | POA: Insufficient documentation

## 2011-11-07 DIAGNOSIS — K5090 Crohn's disease, unspecified, without complications: Secondary | ICD-10-CM | POA: Insufficient documentation

## 2011-11-07 DIAGNOSIS — R05 Cough: Secondary | ICD-10-CM

## 2011-11-07 DIAGNOSIS — R109 Unspecified abdominal pain: Secondary | ICD-10-CM

## 2011-11-07 DIAGNOSIS — Z1211 Encounter for screening for malignant neoplasm of colon: Secondary | ICD-10-CM | POA: Insufficient documentation

## 2011-11-07 MED ORDER — ESOMEPRAZOLE MAGNESIUM 40 MG PO CPDR: 40.0000 mg | DELAYED_RELEASE_CAPSULE | Freq: Every day | ORAL | Status: DC

## 2011-11-07 MED ORDER — SODIUM CHLORIDE 0.9 % IV SOLN: 500.0000 mL | INTRAVENOUS | Status: DC

## 2011-11-07 NOTE — Patient Instructions (Signed)
RESTART ALL YOUR MEDICATIONS, INCLUDING COUMADIN  Normal colonoscopy, follow up in 10 years  ENDOSCOPY- biopsies taken  YOU HAD AN ENDOSCOPIC PROCEDURE TODAY AT [facility]: Refer to the procedure report that was given to you for any specific questions about what was found during the examination.  If the procedure report does not answer your questions, please call your gastroenterologist to clarify.  If you requested that your care partner not be given the details of your procedure findings, then the procedure report has been included in a sealed envelope for you to review at your convenience later.  YOU SHOULD EXPECT: Some feelings of bloating in the abdomen. Passage of more gas than usual.  Walking can help get rid of the air that was put into your GI tract during the procedure and reduce the bloating. If you had a lower endoscopy (such as a colonoscopy or flexible sigmoidoscopy) you may notice spotting of blood in your stool or on the toilet paper. If you underwent a bowel prep for your procedure, then you may not have a normal bowel movement for a few days.  DIET: Your first meal following the procedure should be a light meal and then it is ok to progress to your normal diet.  A half-sandwich or bowl of soup is an example of a good first meal.  Heavy or fried foods are harder to digest and may make you feel nauseous or bloated.  Likewise meals heavy in dairy and vegetables can cause extra gas to form and this can also increase the bloating.  Drink plenty of fluids but you should avoid alcoholic beverages for 24 hours.  ACTIVITY: Your care partner should take you home directly after the procedure.  You should plan to take it easy, moving slowly for the rest of the day.  You can resume normal activity the day after the procedure however you should NOT DRIVE or use heavy machinery for 24 hours (because of the sedation medicines used during the test).    SYMPTOMS TO REPORT  IMMEDIATELY: A gastroenterologist can be reached at any hour.  During normal business hours, 8:30 AM to 5:00 PM Monday through Friday, call [phone].  After hours and on weekends, please call the GI answering service at [phone] who will take a message and have the physician on call contact you.   Following lower endoscopy (colonoscopy or flexible sigmoidoscopy):  Excessive amounts of blood in the stool  Significant tenderness or worsening of abdominal pains  Swelling of the abdomen that is new, acute  Fever of 100F or higher  Following upper endoscopy (EGD)  Vomiting of blood or coffee ground material  New chest pain or pain under the shoulder blades  Painful or persistently difficult swallowing  New shortness of breath  Fever of 100F or higher  Black, tarry-looking stools  FOLLOW UP: If any biopsies were taken you will be contacted by phone or by letter within the next 1-3 weeks.  Call your gastroenterologist if you have not heard about the biopsies in 3 weeks.  Our staff will call the home number listed on your records the next business day following your procedure to check on you and address any questions or concerns that you may have at that time regarding the information given to you following your procedure. This is a courtesy call and so if there is no answer at the home number and we have not heard from you through the emergency physician on call, we  will assume that you have returned to your regular daily activities without incident.  SIGNATURES/CONFIDENTIALITY: You and/or your care partner have signed paperwork which will be entered into your electronic medical record.  These signatures attest to the fact that that the information above on your After Visit Summary has been reviewed and is understood.  Full responsibility of the confidentiality of this discharge information lies with you and/or your care-partner.

## 2011-11-07 NOTE — Op Note (Signed)
[facility] [address] [facility] [address]. [place], [place]  [id number]  ENDOSCOPY PROCEDURE REPORT  PATIENT:  Francis Campbell, Francis Campbell  MR#:  [id number] BIRTHDATE:  [birth year], 65 yrs. old  GENDER:  male  ENDOSCOPIST:  Loralee Pacas. Sharlett Iles, MD, [facility] Referred by:  [name], M.D.  PROCEDURE DATE:  11/07/2011 PROCEDURE:  EGD with biopsy for H. pylori 43239 ASA CLASS:  Class III INDICATIONS:  ATYPICAL CHEST PAIN  MEDICATIONS:   There was residual sedation effect present from prior procedure., propofol (Diprivan) 150 mg IV TOPICAL ANESTHETIC:  DESCRIPTION OF PROCEDURE:   After the risks and benefits of the procedure were explained, informed consent was obtained.  The LB GIF-H180 [id number] endoscope was introduced through the mouth and advanced to the second portion of the duodenum.  The instrument was slowly withdrawn as the mucosa was fully examined. <<PROCEDUREIMAGES>>  Multiple erosions were found. EROSIONS IN DUODENAL BULB AND GASTRIC ANTRUM.CLO BX. DONE.  The esophagus and gastroesophageal junction were completely normal in appearance.  Otherwise the examination was normal.    Retroflexed views revealed no abnormalities.    The scope was then withdrawn from the patient and the procedure completed.  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 RECOMMENDATIONS: 1) Rx CLO if positive 2) continue PPI RESTART ALL MEDS INCLUDING COUMADIN.  ______________________________ Loralee Pacas. Sharlett Iles, MD, [facility]  CC:  n. eSIGNED:   Loralee Pacas. Patterson at 11/07/2011 09:04 AM  Mauzy, Hassell Done, [phone]

## 2011-11-07 NOTE — Telephone Encounter (Signed)
Never heard back from patients wife

## 2011-11-07 NOTE — Op Note (Signed)
[facility] [address]. [place], [place]  [id number]  COLONOSCOPY PROCEDURE REPORT  PATIENT:  Francis Campbell, Francis Campbell  MR#:  [id number] BIRTHDATE:  28-Dec-1945, 33 yrs. old  GENDER:  male ENDOSCOPIST:  Loralee Pacas. Sharlett Iles, MD, [facility] REF. BY:  [name], M.D. PROCEDURE DATE:  11/07/2011 PROCEDURE:  Average-risk screening colonoscopy G0121 ASA CLASS:  Class III INDICATIONS:  Routine Risk Screening MEDICATIONS:   propofol (Diprivan) 100 mg IV  DESCRIPTION OF PROCEDURE:   After the risks and benefits and of the procedure were explained, informed consent was obtained. Digital rectal exam was performed and revealed no abnormalities. The LB 180AL [id number] endoscope was introduced through the anus and advanced to the cecum, which was identified by both the appendix and ileocecal valve.  The quality of the prep was excellent, using MoviPrep.  The instrument was then slowly withdrawn as the colon was fully examined. <<PROCEDUREIMAGES>>  FINDINGS:  No polyps or cancers were seen.  This was otherwise a normal examination of the colon.   Retroflexed views in the rectum revealed no abnormalities.    The scope was then withdrawn from the patient and the procedure completed.  COMPLICATIONS:  None ENDOSCOPIC IMPRESSION: 1) No polyps or cancers 2) Otherwise normal examination RECOMMENDATIONS: 1) Repeat Colonscopy in 10 years. restart all meds  REPEAT EXAM:  No  ______________________________ Loralee Pacas. Sharlett Iles, MD, [facility]  CC:  n. eSIGNED:   Loralee Pacas. Patterson at 11/07/2011 08:58 AM  Donate, Hassell Done, [phone]

## 2011-11-07 NOTE — Progress Notes (Signed)
Patient did not experience any of the following events: a burn prior to discharge; a fall within the facility; wrong site/side/patient/procedure/implant event; or a hospital transfer or hospital admission upon discharge from the facility. (G8907) Patient did not have preoperative order for IV antibiotic SSI prophylaxis. (G8918)  

## 2011-11-08 ENCOUNTER — Telehealth: Payer: Self-pay

## 2011-11-08 NOTE — Telephone Encounter (Signed)
Left message on answering machine. 

## 2011-11-09 ENCOUNTER — Encounter: Payer: Self-pay | Admitting: Gastroenterology

## 2011-11-13 ENCOUNTER — Telehealth: Payer: Self-pay | Admitting: Gastroenterology

## 2011-11-13 NOTE — Telephone Encounter (Signed)
Dr Sharlett Iles, Protonix was ordered on 10/29/11 and he was taking Nexium prior to the procedure. The day of the ECL on 11/07/11, Nexium was reordered. Pt had pretty bad esophagitis; do you want Nexium in am and protonix in the pm? Thanks.

## 2011-11-13 NOTE — Telephone Encounter (Signed)
nexium

## 2011-11-14 ENCOUNTER — Ambulatory Visit (INDEPENDENT_AMBULATORY_CARE_PROVIDER_SITE_OTHER): Payer: 59 | Admitting: Pharmacist

## 2011-11-14 DIAGNOSIS — Z7901 Long term (current) use of anticoagulants: Secondary | ICD-10-CM

## 2011-11-14 DIAGNOSIS — I4891 Unspecified atrial fibrillation: Secondary | ICD-10-CM

## 2011-11-14 DIAGNOSIS — I480 Paroxysmal atrial fibrillation: Secondary | ICD-10-CM

## 2011-11-14 LAB — POCT INR: INR: 1.9

## 2011-11-14 NOTE — Telephone Encounter (Signed)
Informed pt Dr Sharlett Iles instructed him to take Nexium only, 30 minutes prior to breakfast daily; pt stated understanding.

## 2011-11-26 ENCOUNTER — Other Ambulatory Visit: Payer: Self-pay | Admitting: *Deleted

## 2011-11-26 MED ORDER — DIGOXIN 125 MCG PO TABS: 125.0000 ug | ORAL_TABLET | Freq: Every day | ORAL | Status: DC

## 2011-11-26 NOTE — Telephone Encounter (Signed)
Refilled digoxin

## 2011-11-27 ENCOUNTER — Encounter: Payer: Self-pay | Admitting: Family Medicine

## 2011-11-27 ENCOUNTER — Ambulatory Visit (INDEPENDENT_AMBULATORY_CARE_PROVIDER_SITE_OTHER): Payer: 59 | Admitting: Family Medicine

## 2011-11-27 VITALS — BP 150/84 | Temp 98.0°F | Wt 181.0 lb

## 2011-11-27 DIAGNOSIS — F329 Major depressive disorder, single episode, unspecified: Secondary | ICD-10-CM

## 2011-11-27 DIAGNOSIS — Z299 Encounter for prophylactic measures, unspecified: Secondary | ICD-10-CM

## 2011-11-27 MED ORDER — TETANUS-DIPHTH-ACELL PERTUSSIS 5-2.5-18.5 LF-MCG/0.5 IM SUSP: 0.5000 mL | Freq: Once | INTRAMUSCULAR | Status: DC

## 2011-11-27 MED ORDER — SERTRALINE HCL 50 MG PO TABS: 50.0000 mg | ORAL_TABLET | Freq: Every day | ORAL | Status: DC

## 2011-11-27 NOTE — Patient Instructions (Signed)
Consider stopping Sertraline in  6- 9 months if things are going well.

## 2011-11-27 NOTE — Progress Notes (Signed)
  Subjective:    Patient ID: Francis Campbell, male    DOB: 1945/10/14, 66 y.o.   MRN: [id number]  HPI  Followup depression and anxiety. Refer to prior note. Started sertraline 50 mg daily. Patient is seeing great response. Previous hesitation being in crowds. He has had easier time going to work and improved sleep. Less depressed mood. Appetite is improved. Food intake is improved. Outlook and concentration improved. No prior history of depression .  No suicidal ideation  Review of Systems  Respiratory: Negative for cough and shortness of breath.   Cardiovascular: Negative for chest pain.  Psychiatric/Behavioral: Negative for suicidal ideas, hallucinations, confusion and agitation. The patient is not nervous/anxious.        Objective:   Physical Exam  Constitutional: He appears well-developed and well-nourished.  Cardiovascular: Normal rate and regular rhythm.   Pulmonary/Chest: Effort normal and breath sounds normal. No respiratory distress. He has no wheezes. He has no rales.  Psychiatric: He has a normal mood and affect. His behavior is normal. Judgment and thought content normal.        Assessment & Plan:  Maj. depression significantly improved. Would recommend minimum treatment of 6-9 months with sertraline. Routine followup 6 months and decide whether to discontinue at that time.

## 2011-11-28 ENCOUNTER — Telehealth: Payer: Self-pay | Admitting: Cardiology

## 2011-11-28 ENCOUNTER — Encounter: Payer: Self-pay | Admitting: Cardiology

## 2011-11-28 ENCOUNTER — Ambulatory Visit (INDEPENDENT_AMBULATORY_CARE_PROVIDER_SITE_OTHER): Payer: 59 | Admitting: Cardiology

## 2011-11-28 VITALS — BP 120/78 | Ht 74.0 in | Wt 182.0 lb

## 2011-11-28 DIAGNOSIS — I4891 Unspecified atrial fibrillation: Secondary | ICD-10-CM

## 2011-11-28 DIAGNOSIS — I480 Paroxysmal atrial fibrillation: Secondary | ICD-10-CM

## 2011-11-28 DIAGNOSIS — E7800 Pure hypercholesterolemia, unspecified: Secondary | ICD-10-CM

## 2011-11-28 DIAGNOSIS — I119 Hypertensive heart disease without heart failure: Secondary | ICD-10-CM

## 2011-11-28 NOTE — Telephone Encounter (Signed)
Scheduled appointment to see  [name] this afternoon

## 2011-11-28 NOTE — Assessment & Plan Note (Signed)
His blood pressure at home has been running a little high in the range of 150/80.  In the office his pressure is normal but it probably is normal because of the atrial fibrillation today.  Once he converts back to normal sinus rhythm it would be expected to go back up and if it does he will increase his amlodipine to a higher dose of 10 mg daily.

## 2011-11-28 NOTE — Telephone Encounter (Signed)
New Msg: Pt calling wanting to speak with nurse regarding pt having chest tightness that comes and goes and irregular heart rate. Pt stated symptoms have been present since last night. Pt wants to know if he can be seen today for these symptoms.  Please return pt call to discuss further.

## 2011-11-28 NOTE — Assessment & Plan Note (Signed)
The patient has not had any recurrent atrial fibrillation until this morning.  He has not had any TIA symptoms.  He is long-term Coumadin.

## 2011-11-28 NOTE — Progress Notes (Signed)
Francis Campbell Date of Birth:  July 28, 1946 [facility] [address] [name], [name]  [address] [phone]         Fax   [phone]  History of Present Illness:  This pleasant 66 year old gentleman is seen for a scheduled followup office visit.  He has a past history of paroxysmal atrial fibrillation.  As a history of atypical chest pain.  He had a recent normal nuclear stress test.  He is on chronic Coumadin anticoagulation.  He has had a history of high blood pressure.  He recently was immobilized for about a week with severe depression.  He has responded to Zoloft 50 mg daily and is now back to work and his depression is 95%: According to the patient.  The patient has a history of paroxysmal atrial fibrillation which he treats with flecainide as a "pill in the pocket".  He noted that he was out of rhythm this morning and he anticipates on taking flecainide this evening if he has not converted by then.  Current Outpatient Prescriptions  Medication Sig Dispense Refill  . amLODipine (NORVASC) 5 MG tablet Take 5 mg by mouth. 2 daily      . aspirin 81 MG tablet Take 81 mg by mouth daily.        . Cholecalciferol (VITAMIN D3) 1000 UNITS CAPS Take 2 capsules by mouth daily.        [name] diltiazem (CARDIZEM LA) 240 MG 24 hr tablet Take 1 tablet (240 mg total) by mouth daily.  30 tablet  3  . esomeprazole (NEXIUM) 40 MG capsule Take 1 capsule (40 mg total) by mouth daily.  30 capsule  11  . flecainide (TAMBOCOR) 100 MG tablet 3 tablets at onset of atrial fib  12 tablet  prn  . furosemide (LASIX) 40 MG tablet daily. 1/2 tab every other day      . lovastatin (MEVACOR) 40 MG tablet Take 1 tablet (40 mg total) by mouth daily.  90 tablet  3  . metoprolol (TOPROL XL) 100 MG 24 hr tablet Take 1 tablet (100 mg total) by mouth daily.  30 tablet  3  . Multiple Vitamin (MULTIVITAMIN) tablet Take 1 tablet by mouth daily.      . potassium chloride SA (K-DUR,KLOR-CON) 20 MEQ tablet Take 20  mEq by mouth daily.      . sertraline (ZOLOFT) 50 MG tablet Take 1 tablet (50 mg total) by mouth daily.  90 tablet  3  . warfarin (COUMADIN) 5 MG tablet Take 5 mg by mouth daily. Take 5 mg on Tues, Thurs, and Sunday. Take 10 mg on Mon, wed, fri, and Sat.      [name] DISCONTD: amLODipine (NORVASC) 5 MG tablet Take 1 tablet (5 mg total) by mouth daily.  90 tablet  3  . ALPRAZolam (XANAX) 0.25 MG tablet Take 1 tablet (0.25 mg total) by mouth 2 (two) times daily as needed for sleep or anxiety.  60 tablet  5  . hydrochlorothiazide (MICROZIDE) 12.5 MG capsule        Current Facility-Administered Medications  Medication Dose Route Frequency Provider Last Rate Last Dose  . TDaP (BOOSTRIX) injection 0.5 mL  0.5 mL Intramuscular Once [name], MD        Allergies  Allergen Reactions  . Amiodarone     Abn  thyroid  . Floxin (Ofloxacin)     hallucinations  . Lisinopril Cough  . Tavist Allergy (Clemastine Fumarate)     A  fib worse  . Robitussin Cf (Pseudoephedrine-Dm-Gg) Palpitations    Patient Active Problem List  Diagnoses  . Paroxysmal atrial fibrillation  . Hypercholesteremia  . Hypertension  . Aortic insufficiency  . Systolic murmur  . Cough, persistent  . Cough  . Leucocytosis  . Atrial fibrillation with RVR  . Chest pain radiating to arm  . Abdominal pain  . Crohn disease  . Anticoagulated on Coumadin  . AF (atrial fibrillation)  . Major depression  . Regional enteritis of unspecified site  . Special screening for malignant neoplasms, colon  . Duodenitis    History  Smoking status  . Never Smoker   Smokeless tobacco  . Never Used    History  Alcohol Use No    Family History  Problem Relation Age of Onset  . Stroke Mother   . Hypertension Mother   . Kidney disease Mother     stones  . Nephrolithiasis Brother   . Arthritis Paternal Grandmother   . Heart disease Paternal Grandmother   . Leukemia Father   . Heart disease Father   . Nephrolithiasis Father      Review of Systems: Constitutional: no fever chills diaphoresis or fatigue or change in weight.  Head and neck: no hearing loss, no epistaxis, no photophobia or visual disturbance. Respiratory: No cough, shortness of breath or wheezing. Cardiovascular: No chest pain peripheral edema, palpitations. Gastrointestinal: No abdominal distention, no abdominal pain, no change in bowel habits hematochezia or melena. Genitourinary: No dysuria, no frequency, no urgency, no nocturia. Musculoskeletal:No arthralgias, no back pain, no gait disturbance or myalgias. Neurological: No dizziness, no headaches, no numbness, no seizures, no syncope, no weakness, no tremors. Hematologic: No lymphadenopathy, no easy bruising. Psychiatric: No confusion, no hallucinations, no sleep disturbance.    Physical Exam: Filed Vitals:   11/28/11 1348  BP: 120/78   the general appearance reveals a well-developed well-nourished gentleman in no distress.  He is cheerful today.  He does not appear to be depressed.Pupils equal and reactive.   Extraocular Movements are full.  There is no scleral icterus.  The mouth and pharynx are normal.  The neck is supple.  The carotids reveal no bruits.  The jugular venous pressure is normal.  The thyroid is not enlarged.  There is no lymphadenopathy.  The chest is clear to percussion and auscultation. There are no rales or rhonchi. Expansion of the chest is symmetrical.  Heart reveals a soft systolic ejection murmur at the base.The abdomen is soft and nontender. Bowel sounds are normal. The liver and spleen are not enlarged. There Are no abdominal masses. There are no bruits.  The rhythm is irregular in atrial fibrillation clinically.  The pedal pulses are good.  There is no phlebitis or edema.  There is no cyanosis or clubbing. Strength is normal and symmetrical in all extremities.  There is no lateralizing weakness.  There are no sensory deficits.     Assessment / Plan: Continue  same medication.  Increase her amlodipine if necessary to control systolic blood pressure recheck in 2 months for followup office visit.  He will keep his appointment later this month for lab work.

## 2011-11-28 NOTE — Patient Instructions (Signed)
Increase your Amlodipine to 10 mg daily Keep lab appointment on 3/27 Follow up with  [name] on 5/14 at 9:00

## 2011-11-28 NOTE — Assessment & Plan Note (Signed)
The patient is on lovastatin for his hypercholesterolemia.  He is not having any myalgias or side effects from the statin therapy.

## 2011-11-29 ENCOUNTER — Other Ambulatory Visit: Payer: Self-pay | Admitting: *Deleted

## 2011-11-29 MED ORDER — METOPROLOL SUCCINATE ER 100 MG PO TB24: 100.0000 mg | ORAL_TABLET | Freq: Every day | ORAL | Status: DC

## 2011-11-29 MED ORDER — DILTIAZEM HCL ER COATED BEADS 240 MG PO TB24: 240.0000 mg | ORAL_TABLET | Freq: Every day | ORAL | Status: DC

## 2011-11-29 NOTE — Telephone Encounter (Signed)
Refilled cardizem and metoprolol

## 2011-11-30 ENCOUNTER — Other Ambulatory Visit: Payer: Self-pay

## 2011-11-30 MED ORDER — METOPROLOL SUCCINATE ER 100 MG PO TB24: 100.0000 mg | ORAL_TABLET | Freq: Every day | ORAL | Status: DC

## 2011-12-04 ENCOUNTER — Telehealth: Payer: Self-pay | Admitting: Cardiology

## 2011-12-04 MED ORDER — METOPROLOL SUCCINATE ER 100 MG PO TB24: 100.0000 mg | ORAL_TABLET | Freq: Every day | ORAL | Status: DC

## 2011-12-04 MED ORDER — AMLODIPINE BESYLATE 10 MG PO TABS: 10.0000 mg | ORAL_TABLET | Freq: Every day | ORAL | Status: DC

## 2011-12-04 MED ORDER — DILTIAZEM HCL ER COATED BEADS 240 MG PO TB24: 240.0000 mg | ORAL_TABLET | Freq: Every day | ORAL | Status: DC

## 2011-12-04 NOTE — Telephone Encounter (Signed)
New msg Pt wants to talk to a nurse about his meds. He says some of the dosage has changed and he wants 90 day refill . He wants to review with you please call him back

## 2011-12-04 NOTE — Telephone Encounter (Signed)
Patient called wanting 90 day prescriptions on metoprolol ,diltiazem,amlodipine,sent to [facility].

## 2011-12-07 ENCOUNTER — Other Ambulatory Visit: Payer: Self-pay | Admitting: *Deleted

## 2011-12-07 ENCOUNTER — Telehealth: Payer: Self-pay | Admitting: *Deleted

## 2011-12-07 DIAGNOSIS — E785 Hyperlipidemia, unspecified: Secondary | ICD-10-CM

## 2011-12-07 MED ORDER — LOVASTATIN 20 MG PO TABS: 20.0000 mg | ORAL_TABLET | Freq: Every day | ORAL | Status: DC

## 2011-12-07 NOTE — Telephone Encounter (Signed)
Received information from [facility] and patient should not be taking more than 20 mg of Lovastatin with the Diltiazem.  Will cut 40's in half for now.  New Rx sent to [facility].  Discussed with  [name] and advised wife

## 2011-12-12 ENCOUNTER — Telehealth: Payer: Self-pay | Admitting: Cardiology

## 2011-12-12 ENCOUNTER — Ambulatory Visit: Payer: 59 | Admitting: Cardiology

## 2011-12-12 ENCOUNTER — Other Ambulatory Visit: Payer: Self-pay

## 2011-12-12 ENCOUNTER — Ambulatory Visit (INDEPENDENT_AMBULATORY_CARE_PROVIDER_SITE_OTHER): Payer: 59 | Admitting: Pharmacist

## 2011-12-12 ENCOUNTER — Ambulatory Visit (INDEPENDENT_AMBULATORY_CARE_PROVIDER_SITE_OTHER): Payer: 59 | Admitting: *Deleted

## 2011-12-12 DIAGNOSIS — Z7901 Long term (current) use of anticoagulants: Secondary | ICD-10-CM

## 2011-12-12 DIAGNOSIS — I119 Hypertensive heart disease without heart failure: Secondary | ICD-10-CM

## 2011-12-12 DIAGNOSIS — I4891 Unspecified atrial fibrillation: Secondary | ICD-10-CM

## 2011-12-12 DIAGNOSIS — I480 Paroxysmal atrial fibrillation: Secondary | ICD-10-CM

## 2011-12-12 DIAGNOSIS — I10 Essential (primary) hypertension: Secondary | ICD-10-CM

## 2011-12-12 DIAGNOSIS — E7800 Pure hypercholesterolemia, unspecified: Secondary | ICD-10-CM

## 2011-12-12 LAB — BASIC METABOLIC PANEL
CO2: 29 mEq/L (ref 19–32)
Calcium: 9.1 mg/dL (ref 8.4–10.5)
Chloride: 105 mEq/L (ref 96–112)
Glucose, Bld: 95 mg/dL (ref 70–99)
Potassium: 4 mEq/L (ref 3.5–5.1)
Sodium: 140 mEq/L (ref 135–145)

## 2011-12-12 LAB — HEPATIC FUNCTION PANEL
AST: 42 U/L — ABNORMAL HIGH (ref 0–37)
Total Bilirubin: 0.6 mg/dL (ref 0.3–1.2)

## 2011-12-12 LAB — LIPID PANEL
Total CHOL/HDL Ratio: 5
Triglycerides: 216 mg/dL — ABNORMAL HIGH (ref 0.0–149.0)

## 2011-12-12 MED ORDER — AMLODIPINE BESYLATE 10 MG PO TABS: 5.0000 mg | ORAL_TABLET | Freq: Every day | ORAL | Status: DC

## 2011-12-12 NOTE — Telephone Encounter (Signed)
Pt is concerned because when he had his inr done this am, he was told that one of his meds can cause swelling in his lower extremities.  He states he has swelling in bil ankles and feet x .  He denies sob.  The swelling goes down at night.

## 2011-12-12 NOTE — Telephone Encounter (Signed)
Pt was notified and agrees.

## 2011-12-12 NOTE — Telephone Encounter (Signed)
Pt calling re reaction to meds, pls call

## 2011-12-12 NOTE — Telephone Encounter (Signed)
Follow up from previous call:  Patient returning call back to nurse Jackelyn Poling

## 2011-12-12 NOTE — Progress Notes (Signed)
Quick Note:  Please report to patient. The recent labs are stable. Continue same medication and careful diet. Potassium is good. ______

## 2011-12-12 NOTE — Telephone Encounter (Signed)
Swelling x 3 weeks approx.

## 2011-12-12 NOTE — Telephone Encounter (Signed)
Swelling is moderate.  A 4 on a scale of 1-10.

## 2011-12-12 NOTE — Telephone Encounter (Signed)
The swelling is probably secondary to the amlodipine.  Decrease amlodipine down to 5 mg daily

## 2011-12-18 ENCOUNTER — Telehealth: Payer: Self-pay | Admitting: *Deleted

## 2011-12-18 MED ORDER — FUROSEMIDE 20 MG PO TABS: 20.0000 mg | ORAL_TABLET | Freq: Every day | ORAL | Status: DC

## 2011-12-18 NOTE — Telephone Encounter (Signed)
Use 10 mg amlopidine.

## 2011-12-18 NOTE — Telephone Encounter (Signed)
Increase lasix to 1/2 tab every day to help with BP

## 2011-12-18 NOTE — Telephone Encounter (Signed)
Advised patient  New Rx for Furosemide 20 mg daily sent to pharmacy so he will not need to cut in half

## 2011-12-18 NOTE — Telephone Encounter (Signed)
FU Call: pt calling back to speak with [facility] regarding pt having difficulty getting his BP down. Pt BP continues to be a little high. Please return pt call to discuss further.

## 2011-12-18 NOTE — Telephone Encounter (Signed)
Message copied by Earvin Hansen on Tue Dec 18, 2011  4:09 PM ------      Message from: Darlin Coco      Created: Wed Dec 12, 2011  9:20 PM       Please report to patient.  The recent labs are stable. Continue same medication and careful diet. Potassium is good.

## 2011-12-18 NOTE — Telephone Encounter (Signed)
Last night blood pressure 170/105, took 10 mg of Amlodipine today. Blood pressure 140's/95 this am.  Knows it will probably cause increase swelling but was more concerned about his blood pressure. Will forward to  [name] for review.

## 2011-12-18 NOTE — Telephone Encounter (Signed)
Advised of labs 

## 2011-12-26 ENCOUNTER — Ambulatory Visit (INDEPENDENT_AMBULATORY_CARE_PROVIDER_SITE_OTHER): Payer: 59 | Admitting: *Deleted

## 2011-12-26 DIAGNOSIS — I480 Paroxysmal atrial fibrillation: Secondary | ICD-10-CM

## 2011-12-26 DIAGNOSIS — I4891 Unspecified atrial fibrillation: Secondary | ICD-10-CM

## 2011-12-26 DIAGNOSIS — Z7901 Long term (current) use of anticoagulants: Secondary | ICD-10-CM

## 2011-12-26 LAB — POCT INR: INR: 3.7

## 2012-01-01 ENCOUNTER — Ambulatory Visit: Payer: 59 | Admitting: Cardiology

## 2012-01-16 ENCOUNTER — Ambulatory Visit (INDEPENDENT_AMBULATORY_CARE_PROVIDER_SITE_OTHER): Payer: 59 | Admitting: *Deleted

## 2012-01-16 DIAGNOSIS — I480 Paroxysmal atrial fibrillation: Secondary | ICD-10-CM

## 2012-01-16 DIAGNOSIS — I4891 Unspecified atrial fibrillation: Secondary | ICD-10-CM

## 2012-01-16 DIAGNOSIS — Z7901 Long term (current) use of anticoagulants: Secondary | ICD-10-CM

## 2012-01-16 LAB — POCT INR: INR: 2.6

## 2012-01-23 ENCOUNTER — Telehealth: Payer: Self-pay | Admitting: Cardiology

## 2012-01-23 DIAGNOSIS — I119 Hypertensive heart disease without heart failure: Secondary | ICD-10-CM

## 2012-01-23 NOTE — Telephone Encounter (Signed)
Decrease amlodipine to 5 mg daily to lessen edema.  Add losartan 50 mg daily. Check BMET 1 week after starting losartan.

## 2012-01-23 NOTE — Telephone Encounter (Signed)
Pt calling re ankles swelling from medication

## 2012-01-23 NOTE — Telephone Encounter (Signed)
Taking Amlodipine 10 mg daily and Lasix 20 mg daily.  Amlodipine was increase in March secondary to elevated blood pressure. When he gets home in the evening he has a lot of swelling in has ankles, denies any pain or other problems.  Will forward to  [name] for review

## 2012-01-23 NOTE — Telephone Encounter (Signed)
When called patient to advise to alternate Amlodipine 5 mg and 10 mg patient stated his blood pressure has still been running around 150/80-90.  Will forward to  [name] for review. Patient is aware it will be Friday since  [name] out today and I will be out tomorrow.

## 2012-01-25 MED ORDER — LOSARTAN POTASSIUM 50 MG PO TABS: 50.0000 mg | ORAL_TABLET | Freq: Every day | ORAL | Status: DC

## 2012-01-25 NOTE — Telephone Encounter (Signed)
Advised patient.  Having labs Friday am at Dr Erick Blinks office.  Will follow up with those next week to make sure BMET done

## 2012-01-29 ENCOUNTER — Encounter: Payer: Self-pay | Admitting: Cardiology

## 2012-01-29 ENCOUNTER — Ambulatory Visit (INDEPENDENT_AMBULATORY_CARE_PROVIDER_SITE_OTHER): Payer: 59 | Admitting: Cardiology

## 2012-01-29 VITALS — BP 123/78 | HR 67 | Ht 74.0 in | Wt 183.0 lb

## 2012-01-29 DIAGNOSIS — I480 Paroxysmal atrial fibrillation: Secondary | ICD-10-CM

## 2012-01-29 DIAGNOSIS — I10 Essential (primary) hypertension: Secondary | ICD-10-CM

## 2012-01-29 DIAGNOSIS — R609 Edema, unspecified: Secondary | ICD-10-CM

## 2012-01-29 DIAGNOSIS — I119 Hypertensive heart disease without heart failure: Secondary | ICD-10-CM

## 2012-01-29 DIAGNOSIS — I4891 Unspecified atrial fibrillation: Secondary | ICD-10-CM

## 2012-01-29 DIAGNOSIS — E7800 Pure hypercholesterolemia, unspecified: Secondary | ICD-10-CM

## 2012-01-29 MED ORDER — LOSARTAN POTASSIUM 100 MG PO TABS: 100.0000 mg | ORAL_TABLET | Freq: Every day | ORAL | Status: DC

## 2012-01-29 NOTE — Assessment & Plan Note (Signed)
Since discharge from the hospital the patient has had one episode of atrial fibrillation.  He uses flecainide 300 mg as a pill in the pocket and he has had to use it only one time since last visit.  It converted him after about 2 hours at home and saved  him from having to go to the emergency room

## 2012-01-29 NOTE — Assessment & Plan Note (Signed)
The patient brought in a list of his daily blood pressures at home.  He is still having elevated systolic and diastolic readings.  We cut back on his amlodipine to 5 mg daily because of excessive swelling of his feet and the swelling has improved.  When we did that we began losartan 50 mg daily.  Now we will increase his losartan to 100 mg daily because of persistently high blood pressures

## 2012-01-29 NOTE — Patient Instructions (Signed)
Increase your Losartan to 100 mg daily (double up on 50 mg until gone)  Your physician recommends that you schedule a follow-up appointment in: 3 months ov/ekg

## 2012-01-29 NOTE — Assessment & Plan Note (Signed)
Patient has a history of hypercholesterolemia.  He will be having his annual physical examination with Dr. Elease Hashimoto next week and he will be getting a full lab at that time.

## 2012-01-29 NOTE — Progress Notes (Signed)
Francis Campbell Date of Birth:  02/12/1946 [facility] [address] [place], [place]  [address] [phone]         Fax   [phone]  History of Present Illness: This pleasant 66 year old gentleman is seen for a scheduled followup office visit.  He has a history of paroxysmal atrial fibrillation.  He also has a history of essential hypertension.  He has a history of atypical chest pain and has had previously normal nuclear stress tests. He has a history of mild aortic insufficiency.  No history of congestive heart failure.  Since last visit he has been doing well.  Current Outpatient Prescriptions  Medication Sig Dispense Refill  . ALPRAZolam (XANAX) 0.25 MG tablet Take 1 tablet (0.25 mg total) by mouth 2 (two) times daily as needed for sleep or anxiety.  60 tablet  5  . amLODipine (NORVASC) 5 MG tablet Take 5 mg by mouth daily.      [name] aspirin 81 MG tablet Take 81 mg by mouth daily.        . Cholecalciferol (VITAMIN D3) 1000 UNITS CAPS Take 2 capsules by mouth daily.        [name] diltiazem (CARDIZEM LA) 240 MG 24 hr tablet Take 1 tablet (240 mg total) by mouth daily.  90 tablet  3  . flecainide (TAMBOCOR) 100 MG tablet 3 tablets at onset of atrial fib  12 tablet  prn  . furosemide (LASIX) 20 MG tablet Take 1 tablet (20 mg total) by mouth daily.  30 tablet  11  . losartan (COZAAR) 100 MG tablet Take 1 tablet (100 mg total) by mouth daily.  30 tablet  5  . lovastatin (MEVACOR) 20 MG tablet Take 1 tablet (20 mg total) by mouth at bedtime.  90 tablet  3  . metoprolol succinate (TOPROL XL) 100 MG 24 hr tablet Take 1 tablet (100 mg total) by mouth daily.  90 tablet  3  . Multiple Vitamin (MULTIVITAMIN) tablet Take 1 tablet by mouth daily.      . potassium chloride SA (K-DUR,KLOR-CON) 20 MEQ tablet Take 20 mEq by mouth daily.      . sertraline (ZOLOFT) 50 MG tablet Take 1 tablet (50 mg total) by mouth daily.  90 tablet  3  . warfarin (COUMADIN) 5 MG tablet Take 5 mg by mouth  daily. Take 5 mg on Tues, Thurs, and Sunday. Take 10 mg on Mon, wed, fri, and Sat.      [name] DISCONTD: losartan (COZAAR) 50 MG tablet Take 1 tablet (50 mg total) by mouth daily.  30 tablet  5  . DISCONTD: digoxin (LANOXIN) 0.125 MG tablet Take 1 tablet (125 mcg total) by mouth daily.  90 tablet  3  . DISCONTD: losartan-hydrochlorothiazide (HYZAAR) 100-12.5 MG per tablet       . DISCONTD: pantoprazole (PROTONIX) 40 MG tablet Take 1 tablet (40 mg total) by mouth daily.  30 tablet  1   Current Facility-Administered Medications  Medication Dose Route Frequency Provider Last Rate Last Dose  . TDaP (BOOSTRIX) injection 0.5 mL  0.5 mL Intramuscular Once [name], MD        Allergies  Allergen Reactions  . Amiodarone     Abn  thyroid  . Floxin (Ofloxacin)     hallucinations  . Lisinopril Cough  . Tavist Allergy (Clemastine Fumarate)     A fib worse  . Robitussin Cf (Pseudoephedrine-Dm-Gg) Palpitations    Patient Active Problem List  Diagnoses  .  Paroxysmal atrial fibrillation  . Hypercholesteremia  . Hypertension  . Aortic insufficiency  . Systolic murmur  . Cough, persistent  . Cough  . Leucocytosis  . Atrial fibrillation with RVR  . Chest pain radiating to arm  . Abdominal pain  . Crohn disease  . Anticoagulated on Coumadin  . AF (atrial fibrillation)  . Major depression  . Regional enteritis of unspecified site  . Special screening for malignant neoplasms, colon  . Duodenitis  . Benign hypertensive heart disease without heart failure    History  Smoking status  . Never Smoker   Smokeless tobacco  . Never Used    History  Alcohol Use No    Family History  Problem Relation Age of Onset  . Stroke Mother   . Hypertension Mother   . Kidney disease Mother     stones  . Nephrolithiasis Brother   . Arthritis Paternal Grandmother   . Heart disease Paternal Grandmother   . Leukemia Father   . Heart disease Father   . Nephrolithiasis Father     Review of  Systems: Constitutional: no fever chills diaphoresis or fatigue or change in weight.  Head and neck: no hearing loss, no epistaxis, no photophobia or visual disturbance. Respiratory: No cough, shortness of breath or wheezing. Cardiovascular: No chest pain peripheral edema, palpitations. Gastrointestinal: No abdominal distention, no abdominal pain, no change in bowel habits hematochezia or melena. Genitourinary: No dysuria, no frequency, no urgency, no nocturia. Musculoskeletal:No arthralgias, no back pain, no gait disturbance or myalgias. Neurological: No dizziness, no headaches, no numbness, no seizures, no syncope, no weakness, no tremors. Hematologic: No lymphadenopathy, no easy bruising. Psychiatric: No confusion, no hallucinations, no sleep disturbance.    Physical Exam: Filed Vitals:   01/29/12 0907  BP: 123/78  Pulse: 67   the general appearance reveals a well-developed well-nourished gentleman in no distress.  His previous depression has cleared.The head and neck exam reveals pupils equal and reactive.  Extraocular movements are full.  There is no scleral icterus.  The mouth and pharynx are normal.  The neck is supple.  The carotids reveal no bruits.  The jugular venous pressure is normal.  The  thyroid is not enlarged.  There is no lymphadenopathy.  The chest is clear to percussion and auscultation.  There are no rales or rhonchi.  Expansion of the chest is symmetrical.  The precordium is quiet.  The first heart sound is normal.  The second heart sound is physiologically split.  There is no  gallop rub or click.  There is a soft systolic ejection murmur at the base. There is no abnormal lift or heave.  The abdomen is soft and nontender.  The bowel sounds are normal.  The liver and spleen are not enlarged.  There are no abdominal masses.  There are no abdominal bruits.  Extremities reveal good pedal pulses.  There is no phlebitis or edema.  There is no cyanosis or clubbing.  Strength is  normal and symmetrical in all extremities.  There is no lateralizing weakness.  There are no sensory deficits.  The skin is warm and dry.  There is no rash.    Assessment / Plan: Continue same medication except increase losartan to 100 mg daily.  Recheck in 3 months for followup office visit and EKG

## 2012-01-30 ENCOUNTER — Other Ambulatory Visit: Payer: Self-pay | Admitting: *Deleted

## 2012-01-30 DIAGNOSIS — F419 Anxiety disorder, unspecified: Secondary | ICD-10-CM

## 2012-01-31 MED ORDER — ALPRAZOLAM 0.25 MG PO TABS: 0.2500 mg | ORAL_TABLET | Freq: Two times a day (BID) | ORAL | Status: DC | PRN

## 2012-02-01 ENCOUNTER — Encounter: Payer: 59 | Admitting: Family Medicine

## 2012-02-01 ENCOUNTER — Other Ambulatory Visit: Payer: 59

## 2012-02-04 ENCOUNTER — Other Ambulatory Visit: Payer: 59

## 2012-02-04 ENCOUNTER — Other Ambulatory Visit (INDEPENDENT_AMBULATORY_CARE_PROVIDER_SITE_OTHER): Payer: 59

## 2012-02-04 DIAGNOSIS — Z0000 Encounter for general adult medical examination without abnormal findings: Secondary | ICD-10-CM

## 2012-02-04 LAB — POCT URINALYSIS DIPSTICK
Blood, UA: NEGATIVE
Nitrite, UA: NEGATIVE
Protein, UA: NEGATIVE
Spec Grav, UA: 1.02
Urobilinogen, UA: 0.2
pH, UA: 6

## 2012-02-04 LAB — HEPATIC FUNCTION PANEL
ALT: 28 U/L (ref 0–53)
AST: 23 U/L (ref 0–37)
Albumin: 4 g/dL (ref 3.5–5.2)
Total Bilirubin: 0.8 mg/dL (ref 0.3–1.2)
Total Protein: 6.5 g/dL (ref 6.0–8.3)

## 2012-02-04 LAB — LDL CHOLESTEROL, DIRECT: Direct LDL: 132.3 mg/dL

## 2012-02-04 LAB — BASIC METABOLIC PANEL
BUN: 20 mg/dL (ref 6–23)
Chloride: 104 mEq/L (ref 96–112)
Glucose, Bld: 95 mg/dL (ref 70–99)
Potassium: 4.8 mEq/L (ref 3.5–5.1)
Sodium: 140 mEq/L (ref 135–145)

## 2012-02-04 LAB — CBC WITH DIFFERENTIAL/PLATELET
Eosinophils Relative: 2.6 % (ref 0.0–5.0)
HCT: 46.5 % (ref 39.0–52.0)
Hemoglobin: 15.3 g/dL (ref 13.0–17.0)
Lymphs Abs: 1.3 10*3/uL (ref 0.7–4.0)
MCV: 84.9 fl (ref 78.0–100.0)
Monocytes Absolute: 0.6 10*3/uL (ref 0.1–1.0)
Neutro Abs: 5.8 10*3/uL (ref 1.4–7.7)
Platelets: 213 10*3/uL (ref 150.0–400.0)
WBC: 7.9 10*3/uL (ref 4.5–10.5)

## 2012-02-04 LAB — LIPID PANEL: Total CHOL/HDL Ratio: 6

## 2012-02-05 LAB — TSH: TSH: 2.69 u[IU]/mL (ref 0.35–5.50)

## 2012-02-05 LAB — PSA: PSA: 2.15 ng/mL (ref 0.10–4.00)

## 2012-02-08 ENCOUNTER — Encounter: Payer: 59 | Admitting: Family Medicine

## 2012-02-13 ENCOUNTER — Ambulatory Visit (INDEPENDENT_AMBULATORY_CARE_PROVIDER_SITE_OTHER): Payer: 59 | Admitting: Pharmacist

## 2012-02-13 DIAGNOSIS — I480 Paroxysmal atrial fibrillation: Secondary | ICD-10-CM

## 2012-02-13 DIAGNOSIS — Z7901 Long term (current) use of anticoagulants: Secondary | ICD-10-CM

## 2012-02-13 DIAGNOSIS — I4891 Unspecified atrial fibrillation: Secondary | ICD-10-CM

## 2012-02-13 LAB — POCT INR: INR: 2.6

## 2012-02-18 ENCOUNTER — Other Ambulatory Visit: Payer: Self-pay | Admitting: Cardiology

## 2012-02-18 DIAGNOSIS — I119 Hypertensive heart disease without heart failure: Secondary | ICD-10-CM

## 2012-02-18 MED ORDER — AMLODIPINE BESYLATE 5 MG PO TABS: 5.0000 mg | ORAL_TABLET | Freq: Every day | ORAL | Status: DC

## 2012-02-18 MED ORDER — FUROSEMIDE 20 MG PO TABS: 20.0000 mg | ORAL_TABLET | Freq: Every day | ORAL | Status: DC

## 2012-02-18 NOTE — Telephone Encounter (Signed)
90 day supply for both medication.

## 2012-02-27 ENCOUNTER — Encounter: Payer: Self-pay | Admitting: Family Medicine

## 2012-02-27 ENCOUNTER — Ambulatory Visit (INDEPENDENT_AMBULATORY_CARE_PROVIDER_SITE_OTHER): Payer: 59 | Admitting: Family Medicine

## 2012-02-27 VITALS — BP 120/70 | HR 72 | Temp 98.2°F | Resp 12 | Ht 71.5 in | Wt 183.0 lb

## 2012-02-27 DIAGNOSIS — Z0000 Encounter for general adult medical examination without abnormal findings: Secondary | ICD-10-CM

## 2012-02-27 NOTE — Progress Notes (Signed)
Subjective:    Patient ID: Francis Campbell, male    DOB: April 18, 1946, 66 y.o.   MRN: [id number]  HPI  Complete physical. Patient has history of medical problems including hypertension, atrial fibrillation, hyperlipidemia, Crohn's disease, depression. His pressures been very stable on sertraline 50 mg daily. Recent loss of his father 2 weeks ago. He is coping fairly well. He has questions regarding when he can come off medication. No prior episodes of depression.  History of atrial fibrillation. No recent palpitations, dizziness, or any chest pain. Medications reviewed. Recently reduction of losartan to 50 mg. Blood pressures remained stable.  Immunizations are up to date. Colonoscopy up-to-date.  Past Medical History  Diagnosis Date  . Paroxysmal atrial fibrillation   . Hypercholesteremia   . Hypertension   . Aortic insufficiency   . Systolic murmur   . Kidney stone 2003  . Mini stroke   . Crohn disease   . Esophageal reflux    Past Surgical History  Procedure Date  . Arthoscopic rotaor cuff repair   . Tonsillectomy   . Rotator cuff repair 2000  . Knee surgery 1990, 2000  . Hernia repair 2005  . Eye surgery [place]    reports that he has never smoked. He has never used smokeless tobacco. He reports that he does not drink alcohol or use illicit drugs. family history includes Arthritis in his paternal grandmother; Heart disease in his father and paternal grandmother; Hypertension in his mother; Kidney disease in his mother; Leukemia in his father; Nephrolithiasis in his brother and father; and Stroke in his mother. Allergies  Allergen Reactions  . Amiodarone     Abn  thyroid  . Floxin (Ofloxacin)     hallucinations  . Lisinopril Cough  . Tavist Allergy (Clemastine Fumarate)     A fib worse  . Robitussin Cf (Pseudoephedrine-Dm-Gg) Palpitations      Review of Systems  Constitutional: Negative for fever, activity change, appetite change and fatigue.  HENT:  Negative for ear pain, congestion and trouble swallowing.   Eyes: Negative for pain and visual disturbance.  Respiratory: Negative for cough, shortness of breath and wheezing.   Cardiovascular: Negative for chest pain and palpitations.  Gastrointestinal: Negative for nausea, vomiting, abdominal pain, diarrhea, constipation, blood in stool, abdominal distention and rectal pain.  Genitourinary: Negative for dysuria, hematuria and testicular pain.  Musculoskeletal: Negative for joint swelling and arthralgias.  Skin: Negative for rash.  Neurological: Negative for dizziness, syncope and headaches.  Hematological: Negative for adenopathy.  Psychiatric/Behavioral: Negative for confusion and dysphoric mood.       Objective:   Physical Exam  Constitutional: He is oriented to person, place, and time. He appears well-developed and well-nourished. No distress.  HENT:  Head: Normocephalic and atraumatic.  Right Ear: External ear normal.  Left Ear: External ear normal.  Mouth/Throat: Oropharynx is clear and moist.  Eyes: Conjunctivae and EOM are normal. Pupils are equal, round, and reactive to light.  Neck: Normal range of motion. Neck supple. No thyromegaly present.  Cardiovascular: Normal rate, regular rhythm and normal heart sounds.   No murmur heard. Pulmonary/Chest: No respiratory distress. He has no wheezes. He has no rales.  Abdominal: Soft. Bowel sounds are normal. He exhibits no distension and no mass. There is no tenderness. There is no rebound and no guarding.  Genitourinary:       Rectal exam deferred as patient seen urologist recently  Musculoskeletal: He exhibits no edema.  Lymphadenopathy:    He has no  cervical adenopathy.  Neurological: He is alert and oriented to person, place, and time. He displays normal reflexes. No cranial nerve deficit.  Skin: No rash noted.  Psychiatric: He has a normal mood and affect.          Assessment & Plan:  Complete physical. Labs reviewed  with patient. No concerning abnormalities. Continue current medications. We've recommended at least 3 more months of sertraline and then consider tapering off. Immunizations up-to-date. Colonoscopy up to date. Discussed exercise.

## 2012-02-27 NOTE — Patient Instructions (Addendum)
Consider stopping Zoloft in 3-6 months if doing well then.

## 2012-03-12 ENCOUNTER — Ambulatory Visit (INDEPENDENT_AMBULATORY_CARE_PROVIDER_SITE_OTHER): Payer: 59 | Admitting: *Deleted

## 2012-03-12 DIAGNOSIS — Z7901 Long term (current) use of anticoagulants: Secondary | ICD-10-CM

## 2012-03-12 DIAGNOSIS — I480 Paroxysmal atrial fibrillation: Secondary | ICD-10-CM

## 2012-03-12 DIAGNOSIS — I4891 Unspecified atrial fibrillation: Secondary | ICD-10-CM

## 2012-03-26 ENCOUNTER — Telehealth: Payer: Self-pay | Admitting: Cardiology

## 2012-03-26 NOTE — Telephone Encounter (Signed)
Agree with plan 

## 2012-03-26 NOTE — Telephone Encounter (Signed)
Blood pressure averaging around 95/70 and heart averaging around 125. Started yesterday morning and he took 3 flecainide last night as directed.  No better. Discussed with Cecille Rubin NP and will have patient start on Flencainde BID and call back with update on Friday.  Will discuss with  [name] at that point.  Will forward this to  [name] as well.

## 2012-03-26 NOTE — Telephone Encounter (Signed)
Please return call to patient at [phone] regarding low BP and rapid pulse over the last 2 days.

## 2012-03-28 ENCOUNTER — Telehealth: Payer: Self-pay | Admitting: Cardiology

## 2012-03-28 NOTE — Telephone Encounter (Signed)
Heart rate is fast but unable to count.  States he feels like it is closer to being in rhythm, but not there yet.  Will forward to  [name] for review

## 2012-03-28 NOTE — Telephone Encounter (Signed)
Continue flecainide 100 mg twice a day over the weekend and see if he will convert.  Quiet activity.  If he is still out of rhythm next week consider elective cardioversion.

## 2012-03-28 NOTE — Telephone Encounter (Signed)
New msg Pt wants to talk with you . He said he is still out of rhythm. Please call

## 2012-03-28 NOTE — Telephone Encounter (Signed)
Advised patient, will call Monday with update

## 2012-04-09 ENCOUNTER — Ambulatory Visit (INDEPENDENT_AMBULATORY_CARE_PROVIDER_SITE_OTHER): Payer: 59 | Admitting: *Deleted

## 2012-04-09 DIAGNOSIS — I4891 Unspecified atrial fibrillation: Secondary | ICD-10-CM

## 2012-04-09 DIAGNOSIS — Z7901 Long term (current) use of anticoagulants: Secondary | ICD-10-CM

## 2012-04-09 DIAGNOSIS — I480 Paroxysmal atrial fibrillation: Secondary | ICD-10-CM

## 2012-04-30 ENCOUNTER — Ambulatory Visit (INDEPENDENT_AMBULATORY_CARE_PROVIDER_SITE_OTHER): Payer: 59 | Admitting: Cardiology

## 2012-04-30 ENCOUNTER — Ambulatory Visit (INDEPENDENT_AMBULATORY_CARE_PROVIDER_SITE_OTHER): Payer: 59 | Admitting: *Deleted

## 2012-04-30 ENCOUNTER — Encounter: Payer: Self-pay | Admitting: Cardiology

## 2012-04-30 VITALS — BP 118/70 | HR 66 | Ht 74.0 in | Wt 184.0 lb

## 2012-04-30 DIAGNOSIS — I4891 Unspecified atrial fibrillation: Secondary | ICD-10-CM

## 2012-04-30 DIAGNOSIS — F329 Major depressive disorder, single episode, unspecified: Secondary | ICD-10-CM

## 2012-04-30 DIAGNOSIS — I480 Paroxysmal atrial fibrillation: Secondary | ICD-10-CM

## 2012-04-30 DIAGNOSIS — E7800 Pure hypercholesterolemia, unspecified: Secondary | ICD-10-CM

## 2012-04-30 DIAGNOSIS — Z7901 Long term (current) use of anticoagulants: Secondary | ICD-10-CM

## 2012-04-30 LAB — POCT INR: INR: 3.1

## 2012-04-30 NOTE — Assessment & Plan Note (Signed)
The patient's symptoms of depression are much improved on his current dose of Zoloft 50 mg daily

## 2012-04-30 NOTE — Assessment & Plan Note (Signed)
The patient has a history of hypercholesterolemia.  He is on lovastatin 20 mg daily.  We cannot push the lovastatin and higher because of his being on diltiazem.  We will see plan to recheck his fasting lipids and his next office visit in 3 months.  3 months ago he had his lipids checked at his primary care provider's office and his LDL was 132.  Meanwhile he is going to be even more careful with his diet  As he continues on present dose of lovastatin.

## 2012-04-30 NOTE — Assessment & Plan Note (Signed)
Since last visit the patient had one episode of atrial fibrillation.  It lasted a total of about 24 hours.  It started one afternoon and was persisting into the evening so he took his emergency dose of 3 100 mg flecainide pills at bedtime and shortly after awakening the next morning he was back in normal sinus rhythm.  There was several months ago he's had no recurrence.  He has been walking 1 mile each day with his dog for morning exercise.  He has not been having chest pain or shortness of breath

## 2012-04-30 NOTE — Patient Instructions (Addendum)
Your physician recommends that you continue on your current medications as directed. Please refer to the Current Medication list given to you today  Your physician recommends that you schedule a follow-up appointment in: 3 months with fasting labs (LP/BMET/HFP)

## 2012-04-30 NOTE — Progress Notes (Signed)
Francis Campbell Date of Birth:  07-02-1946 [facility] [address] [place], [place]  [address] [phone]         Fax   [phone]  History of Present Illness: This pleasant 66 year old gentleman is seen for a followup office visit.  He has a history of paroxysmal atrial fibrillation and a history of essential hypertension.  His previous echocardiograms have shown mild aortic insufficiency.  He has had atypical chest pain and has had previously normal nuclear stress tests.  Overall since last visit he has been feeling well.  He is on long-term Coumadin because of his paroxysmal atrial flutter.  Current Outpatient Prescriptions  Medication Sig Dispense Refill  . ALPRAZolam (XANAX) 0.25 MG tablet Take 1 tablet (0.25 mg total) by mouth 2 (two) times daily as needed for sleep or anxiety.  60 tablet  5  . amLODipine (NORVASC) 5 MG tablet Take 1 tablet (5 mg total) by mouth daily.  30 tablet  11  . aspirin 81 MG tablet Take 81 mg by mouth daily.        . Cholecalciferol (VITAMIN D3) 1000 UNITS CAPS Take 2 capsules by mouth daily.        [name] diltiazem (CARDIZEM LA) 240 MG 24 hr tablet Take 1 tablet (240 mg total) by mouth daily.  90 tablet  3  . flecainide (TAMBOCOR) 100 MG tablet 3 tablets at onset of atrial fib  12 tablet  prn  . furosemide (LASIX) 20 MG tablet Take 1 tablet (20 mg total) by mouth daily.  30 tablet  11  . losartan (COZAAR) 50 MG tablet Take 50 mg by mouth daily.      [name] lovastatin (MEVACOR) 20 MG tablet Take 1 tablet (20 mg total) by mouth at bedtime.  90 tablet  3  . metoprolol succinate (TOPROL XL) 100 MG 24 hr tablet Take 1 tablet (100 mg total) by mouth daily.  90 tablet  3  . Multiple Vitamin (MULTIVITAMIN) tablet Take 1 tablet by mouth daily.      . potassium chloride SA (K-DUR,KLOR-CON) 20 MEQ tablet Take 20 mEq by mouth daily.      . sertraline (ZOLOFT) 50 MG tablet Take 1 tablet (50 mg total) by mouth daily.  90 tablet  3  . warfarin (COUMADIN)  5 MG tablet Take 5 mg by mouth as directed. Take as directed by coumadin clinic      . DISCONTD: digoxin (LANOXIN) 0.125 MG tablet Take 1 tablet (125 mcg total) by mouth daily.  90 tablet  3  . DISCONTD: losartan-hydrochlorothiazide (HYZAAR) 100-12.5 MG per tablet       . DISCONTD: pantoprazole (PROTONIX) 40 MG tablet Take 1 tablet (40 mg total) by mouth daily.  30 tablet  1   Current Facility-Administered Medications  Medication Dose Route Frequency Provider Last Rate Last Dose  . TDaP (BOOSTRIX) injection 0.5 mL  0.5 mL Intramuscular Once [name], MD        Allergies  Allergen Reactions  . Amiodarone     Abn  thyroid  . Floxin (Ofloxacin)     hallucinations  . Lisinopril Cough  . Tavist Allergy (Clemastine Fumarate)     A fib worse  . Robitussin Cf (Pseudoephedrine-Dm-Gg) Palpitations    Patient Active Problem List  Diagnosis  . Paroxysmal atrial fibrillation  . Hypercholesteremia  . Hypertension  . Aortic insufficiency  . Systolic murmur  . Cough, persistent  . Cough  . Leucocytosis  . Atrial  fibrillation with RVR  . Chest pain radiating to arm  . Abdominal pain  . Crohn disease  . Anticoagulated on Coumadin  . AF (atrial fibrillation)  . Major depression  . Regional enteritis of unspecified site  . Special screening for malignant neoplasms, colon  . Duodenitis  . Benign hypertensive heart disease without heart failure    History  Smoking status  . Never Smoker   Smokeless tobacco  . Never Used    History  Alcohol Use No    Family History  Problem Relation Age of Onset  . Stroke Mother   . Hypertension Mother   . Kidney disease Mother     stones  . Nephrolithiasis Brother   . Arthritis Paternal Grandmother   . Heart disease Paternal Grandmother   . Leukemia Father   . Heart disease Father   . Nephrolithiasis Father     Review of Systems: Constitutional: no fever chills diaphoresis or fatigue or change in weight.  Head and neck: no  hearing loss, no epistaxis, no photophobia or visual disturbance. Respiratory: No cough, shortness of breath or wheezing. Cardiovascular: No chest pain peripheral edema, palpitations. Gastrointestinal: No abdominal distention, no abdominal pain, no change in bowel habits hematochezia or melena. Genitourinary: No dysuria, no frequency, no urgency, no nocturia. Musculoskeletal:No arthralgias, no back pain, no gait disturbance or myalgias. Neurological: No dizziness, no headaches, no numbness, no seizures, no syncope, no weakness, no tremors. Hematologic: No lymphadenopathy, no easy bruising. Psychiatric: No confusion, no hallucinations, no sleep disturbance.    Physical Exam: Filed Vitals:   04/30/12 0836  BP: 118/70  Pulse: 66   the general appearance reveals a well-developed well-nourished middle-aged gentleman in no distress.The head and neck exam reveals pupils equal and reactive.  Extraocular movements are full.  There is no scleral icterus.  The mouth and pharynx are normal.  The neck is supple.  The carotids reveal no bruits.  The jugular venous pressure is normal.  The  thyroid is not enlarged.  There is no lymphadenopathy.  The chest is clear to percussion and auscultation.  There are no rales or rhonchi.  Expansion of the chest is symmetrical.  The precordium is quiet.  The first heart sound is normal.  The second heart sound is physiologically split.  There is no  gallop rub or click.  There is a faint systolic ejection murmur at the base. There is no abnormal lift or heave.  The abdomen is soft and nontender.  The bowel sounds are normal.  The liver and spleen are not enlarged.  There are no abdominal masses.  There are no abdominal bruits.  Extremities reveal good pedal pulses.  There is no phlebitis or edema.  There is no cyanosis or clubbing.  Strength is normal and symmetrical in all extremities.  There is no lateralizing weakness.  There are no sensory deficits.  The skin is warm and  dry.  There is no rash.  EKG today shows sinus bradycardia with sinus arrhythmia and no ischemic changes  Assessment / Plan:  Continue on same medication.  Continuing to have flecainide on hand if he goes back into atrial fibrillation.  Continue Coumadin.  Recheck 3 months for followup office visit fasting lipid panel hepatic function panel and basal metabolic panel.  Continue careful diet

## 2012-05-05 ENCOUNTER — Other Ambulatory Visit: Payer: Self-pay | Admitting: *Deleted

## 2012-05-05 ENCOUNTER — Other Ambulatory Visit: Payer: Self-pay | Admitting: Cardiology

## 2012-05-05 MED ORDER — WARFARIN SODIUM 5 MG PO TABS: 5.0000 mg | ORAL_TABLET | ORAL | Status: DC

## 2012-05-06 ENCOUNTER — Other Ambulatory Visit: Payer: Self-pay

## 2012-05-06 MED ORDER — WARFARIN SODIUM 10 MG PO TABS: ORAL_TABLET | ORAL | Status: DC

## 2012-05-21 ENCOUNTER — Ambulatory Visit (INDEPENDENT_AMBULATORY_CARE_PROVIDER_SITE_OTHER): Payer: 59 | Admitting: Pharmacist

## 2012-05-21 DIAGNOSIS — I480 Paroxysmal atrial fibrillation: Secondary | ICD-10-CM

## 2012-05-21 DIAGNOSIS — Z7901 Long term (current) use of anticoagulants: Secondary | ICD-10-CM

## 2012-05-21 DIAGNOSIS — I4891 Unspecified atrial fibrillation: Secondary | ICD-10-CM

## 2012-06-02 ENCOUNTER — Other Ambulatory Visit: Payer: Self-pay | Admitting: *Deleted

## 2012-06-04 ENCOUNTER — Telehealth: Payer: Self-pay | Admitting: Cardiology

## 2012-06-04 DIAGNOSIS — I119 Hypertensive heart disease without heart failure: Secondary | ICD-10-CM

## 2012-06-04 MED ORDER — FUROSEMIDE 20 MG PO TABS: 20.0000 mg | ORAL_TABLET | Freq: Every day | ORAL | Status: DC

## 2012-06-04 MED ORDER — LOSARTAN POTASSIUM 100 MG PO TABS: 100.0000 mg | ORAL_TABLET | Freq: Every day | ORAL | Status: DC

## 2012-06-04 MED ORDER — DILTIAZEM HCL ER COATED BEADS 240 MG PO TB24: 240.0000 mg | ORAL_TABLET | Freq: Every day | ORAL | Status: DC

## 2012-06-04 MED ORDER — METOPROLOL SUCCINATE ER 100 MG PO TB24: 100.0000 mg | ORAL_TABLET | Freq: Every day | ORAL | Status: DC

## 2012-06-04 NOTE — Telephone Encounter (Signed)
New Problem:    Patient called returning your call.  Please call back.

## 2012-06-04 NOTE — Telephone Encounter (Signed)
F/u  Returning call back to nurse.

## 2012-06-04 NOTE — Telephone Encounter (Signed)
Patient phoned needing refill on Losartan 100 mg stating increase (looks like it was increase 01/29/12 at ov). Several other Rx's needed as well. Did send to [facility] as requested

## 2012-06-04 NOTE — Telephone Encounter (Signed)
Left message to call back  

## 2012-06-20 ENCOUNTER — Ambulatory Visit (INDEPENDENT_AMBULATORY_CARE_PROVIDER_SITE_OTHER): Payer: 59 | Admitting: *Deleted

## 2012-06-20 DIAGNOSIS — Z7901 Long term (current) use of anticoagulants: Secondary | ICD-10-CM

## 2012-06-20 DIAGNOSIS — I4891 Unspecified atrial fibrillation: Secondary | ICD-10-CM

## 2012-06-20 DIAGNOSIS — I480 Paroxysmal atrial fibrillation: Secondary | ICD-10-CM

## 2012-07-11 ENCOUNTER — Ambulatory Visit (INDEPENDENT_AMBULATORY_CARE_PROVIDER_SITE_OTHER): Payer: 59 | Admitting: *Deleted

## 2012-07-11 DIAGNOSIS — I480 Paroxysmal atrial fibrillation: Secondary | ICD-10-CM

## 2012-07-11 DIAGNOSIS — Z7901 Long term (current) use of anticoagulants: Secondary | ICD-10-CM

## 2012-07-11 DIAGNOSIS — I4891 Unspecified atrial fibrillation: Secondary | ICD-10-CM

## 2012-07-18 ENCOUNTER — Other Ambulatory Visit: Payer: Self-pay | Admitting: *Deleted

## 2012-07-18 DIAGNOSIS — I480 Paroxysmal atrial fibrillation: Secondary | ICD-10-CM

## 2012-07-18 DIAGNOSIS — I10 Essential (primary) hypertension: Secondary | ICD-10-CM

## 2012-07-18 DIAGNOSIS — E7800 Pure hypercholesterolemia, unspecified: Secondary | ICD-10-CM

## 2012-07-28 ENCOUNTER — Ambulatory Visit (INDEPENDENT_AMBULATORY_CARE_PROVIDER_SITE_OTHER): Payer: 59 | Admitting: Cardiology

## 2012-07-28 ENCOUNTER — Ambulatory Visit (INDEPENDENT_AMBULATORY_CARE_PROVIDER_SITE_OTHER): Payer: 59 | Admitting: Pharmacist

## 2012-07-28 ENCOUNTER — Encounter: Payer: Self-pay | Admitting: Cardiology

## 2012-07-28 ENCOUNTER — Other Ambulatory Visit (INDEPENDENT_AMBULATORY_CARE_PROVIDER_SITE_OTHER): Payer: 59

## 2012-07-28 VITALS — BP 122/64 | HR 71 | Resp 18 | Ht 73.0 in | Wt 191.4 lb

## 2012-07-28 DIAGNOSIS — I10 Essential (primary) hypertension: Secondary | ICD-10-CM

## 2012-07-28 DIAGNOSIS — F329 Major depressive disorder, single episode, unspecified: Secondary | ICD-10-CM

## 2012-07-28 DIAGNOSIS — I480 Paroxysmal atrial fibrillation: Secondary | ICD-10-CM

## 2012-07-28 DIAGNOSIS — I4891 Unspecified atrial fibrillation: Secondary | ICD-10-CM

## 2012-07-28 DIAGNOSIS — I119 Hypertensive heart disease without heart failure: Secondary | ICD-10-CM

## 2012-07-28 DIAGNOSIS — I359 Nonrheumatic aortic valve disorder, unspecified: Secondary | ICD-10-CM

## 2012-07-28 DIAGNOSIS — E7800 Pure hypercholesterolemia, unspecified: Secondary | ICD-10-CM

## 2012-07-28 DIAGNOSIS — I351 Nonrheumatic aortic (valve) insufficiency: Secondary | ICD-10-CM

## 2012-07-28 DIAGNOSIS — Z7901 Long term (current) use of anticoagulants: Secondary | ICD-10-CM

## 2012-07-28 LAB — LIPID PANEL
HDL: 40.5 mg/dL (ref >39.00)
VLDL: 37.8 mg/dL (ref 0.0–40.0)

## 2012-07-28 LAB — HEPATIC FUNCTION PANEL
Bilirubin, Direct: 0 mg/dL (ref 0.0–0.3)
Total Bilirubin: 0.7 mg/dL (ref 0.3–1.2)

## 2012-07-28 LAB — BASIC METABOLIC PANEL
CO2: 28 mEq/L (ref 19–32)
Glucose, Bld: 77 mg/dL (ref 70–99)
Potassium: 4.1 mEq/L (ref 3.5–5.1)
Sodium: 141 mEq/L (ref 135–145)

## 2012-07-28 NOTE — Progress Notes (Signed)
Francis Campbell Date of Birth:  [birth year] [facility] [address] [place], [place]  [address] [phone]         Fax   [phone]  History of Present Illness: This pleasant 66 year old gentleman is seen for a followup office visit. He has a history of paroxysmal atrial fibrillation and a history of essential hypertension. His previous echocardiograms have shown mild aortic insufficiency. He has had atypical chest pain and has had previously normal nuclear stress tests. Overall since last visit he has been feeling well. He is on long-term Coumadin because of his paroxysmal atrial flutter.    Current Outpatient Prescriptions  Medication Sig Dispense Refill  . ALPRAZolam (XANAX) 0.25 MG tablet Take 1 tablet (0.25 mg total) by mouth 2 (two) times daily as needed for sleep or anxiety.  60 tablet  5  . amLODipine (NORVASC) 5 MG tablet Take 1 tablet (5 mg total) by mouth daily.  30 tablet  11  . aspirin 81 MG tablet Take 81 mg by mouth daily.        . Cholecalciferol (VITAMIN D3) 1000 UNITS CAPS Take 2 capsules by mouth daily.        [name] diltiazem (CARDIZEM LA) 240 MG 24 hr tablet Take 1 tablet (240 mg total) by mouth daily.  90 tablet  3  . flecainide (TAMBOCOR) 100 MG tablet 3 tablets at onset of atrial fib  12 tablet  prn  . furosemide (LASIX) 20 MG tablet Take 1 tablet (20 mg total) by mouth daily.  90 tablet  3  . losartan (COZAAR) 100 MG tablet Take 1 tablet (100 mg total) by mouth daily.  90 tablet  3  . lovastatin (MEVACOR) 20 MG tablet Take 1 tablet (20 mg total) by mouth at bedtime.  90 tablet  3  . metoprolol succinate (TOPROL XL) 100 MG 24 hr tablet Take 1 tablet (100 mg total) by mouth daily.  90 tablet  3  . Multiple Vitamin (MULTIVITAMIN) tablet Take 1 tablet by mouth daily.      . potassium chloride SA (K-DUR,KLOR-CON) 20 MEQ tablet Take 20 mEq by mouth daily.      . sertraline (ZOLOFT) 50 MG tablet Take 1 tablet (50 mg total) by mouth daily.  90 tablet   3  . warfarin (COUMADIN) 10 MG tablet Take as directed by anticoagulation clinic  90 tablet  1  . [DISCONTINUED] digoxin (LANOXIN) 0.125 MG tablet Take 1 tablet (125 mcg total) by mouth daily.  90 tablet  3  . [DISCONTINUED] losartan-hydrochlorothiazide (HYZAAR) 100-12.5 MG per tablet       . [DISCONTINUED] pantoprazole (PROTONIX) 40 MG tablet Take 1 tablet (40 mg total) by mouth daily.  30 tablet  1   Current Facility-Administered Medications  Medication Dose Route Frequency Provider Last Rate Last Dose  . TDaP (BOOSTRIX) injection 0.5 mL  0.5 mL Intramuscular Once [name], MD        Allergies  Allergen Reactions  . Amiodarone     Abn  thyroid  . Floxin (Ofloxacin)     hallucinations  . Lisinopril Cough  . Tavist Allergy (Clemastine Fumarate)     A fib worse  . Robitussin Cf (Pseudoephedrine-Dm-Gg) Palpitations    Patient Active Problem List  Diagnosis  . Paroxysmal atrial fibrillation  . Hypercholesteremia  . Hypertension  . Aortic insufficiency  . Systolic murmur  . Cough, persistent  . Cough  . Leucocytosis  . Atrial fibrillation with RVR  .  Chest pain radiating to arm  . Abdominal pain  . Crohn disease  . Anticoagulated on Coumadin  . AF (atrial fibrillation)  . Major depression  . Regional enteritis of unspecified site  . Special screening for malignant neoplasms, colon  . Duodenitis  . Benign hypertensive heart disease without heart failure    History  Smoking status  . Never Smoker   Smokeless tobacco  . Never Used    History  Alcohol Use No    Family History  Problem Relation Age of Onset  . Stroke Mother   . Hypertension Mother   . Kidney disease Mother     stones  . Nephrolithiasis Brother   . Arthritis Paternal Grandmother   . Heart disease Paternal Grandmother   . Leukemia Father   . Heart disease Father   . Nephrolithiasis Father     Review of Systems: Constitutional: no fever chills diaphoresis or fatigue or change in  weight.  Head and neck: no hearing loss, no epistaxis, no photophobia or visual disturbance. Respiratory: No cough, shortness of breath or wheezing. Cardiovascular: No chest pain peripheral edema, palpitations. Gastrointestinal: No abdominal distention, no abdominal pain, no change in bowel habits hematochezia or melena. Genitourinary: No dysuria, no frequency, no urgency, no nocturia. Musculoskeletal:No arthralgias, no back pain, no gait disturbance or myalgias. Neurological: No dizziness, no headaches, no numbness, no seizures, no syncope, no weakness, no tremors. Hematologic: No lymphadenopathy, no easy bruising. Psychiatric: No confusion, no hallucinations, no sleep disturbance.    Physical Exam: Filed Vitals:   07/28/12 0819  BP: 122/64  Pulse: 71  Resp: 18   the general appearance reveals a well-developed well-nourished gentleman in no distress.The head and neck exam reveals pupils equal and reactive.  Extraocular movements are full.  There is no scleral icterus.  The mouth and pharynx are normal.  The neck is supple.  The carotids reveal no bruits.  The jugular venous pressure is normal.  The  thyroid is not enlarged.  There is no lymphadenopathy.  The chest is clear to percussion and auscultation.  There are no rales or rhonchi.  Expansion of the chest is symmetrical.  The precordium is quiet.  The first heart sound is normal.  The second heart sound is physiologically split.  There is a faint diastolic murmur of aortic insufficiency at left sternal edge. There is no abnormal lift or heave.  The abdomen is soft and nontender.  The bowel sounds are normal.  The liver and spleen are not enlarged.  There are no abdominal masses.  There are no abdominal bruits.  Extremities reveal good pedal pulses.  There is no phlebitis or edema.  There is no cyanosis or clubbing.  Strength is normal and symmetrical in all extremities.  There is no lateralizing weakness.  There are no sensory deficits.  The  skin is warm and dry.  There is no rash.     Assessment / Plan: Continue same medication.  Work harder on weight loss and diet.  Blood work today is pending.  Recheck in 4 months for followup office visit and EKG

## 2012-07-28 NOTE — Progress Notes (Signed)
Quick Note:  Please report to patient. The recent labs are stable. Continue same medication and careful diet. LDL and cholesterol are still too high. Stop lovastatin and switch to Crestor 5 mg daily. Recheck LP and HFP 2 months after switch. BUN high--drink more water.  ______

## 2012-07-28 NOTE — Assessment & Plan Note (Signed)
The patient has mild aortic insufficiency by echo.  He has not been experiencing any symptoms of CHF

## 2012-07-28 NOTE — Assessment & Plan Note (Signed)
His symptoms of major depression or doing very well on Zoloft and Xanax

## 2012-07-28 NOTE — Patient Instructions (Addendum)
Your physician recommends that you continue on your current medications as directed. Please refer to the Current Medication list given to you today.  Your physician recommends that you schedule a follow-up appointment in: 4 month ov/ekg  Will obtain labs today and call you with the results (lp/bmet/hfp)

## 2012-07-28 NOTE — Assessment & Plan Note (Signed)
He has a history of hypercholesterolemia.  He has been walking a mile a day.  Despite regular exercise his weight is up 7 pounds since last visit.

## 2012-07-28 NOTE — Assessment & Plan Note (Signed)
The patient has not been experiencing any recurrent atrial fibrillation.  He has flecainide on hand for when necessary use.  He has not had to take any flecainide we last saw him.  He has not been experiencing any chest pain or shortness of breath

## 2012-07-29 ENCOUNTER — Other Ambulatory Visit: Payer: Self-pay | Admitting: *Deleted

## 2012-07-29 DIAGNOSIS — I119 Hypertensive heart disease without heart failure: Secondary | ICD-10-CM

## 2012-07-29 MED ORDER — AMLODIPINE BESYLATE 5 MG PO TABS: 5.0000 mg | ORAL_TABLET | Freq: Every day | ORAL | Status: DC

## 2012-07-29 MED ORDER — LOVASTATIN 20 MG PO TABS: 20.0000 mg | ORAL_TABLET | Freq: Every day | ORAL | Status: DC

## 2012-07-29 MED ORDER — WARFARIN SODIUM 10 MG PO TABS: ORAL_TABLET | ORAL | Status: DC

## 2012-07-29 MED ORDER — FUROSEMIDE 20 MG PO TABS: 20.0000 mg | ORAL_TABLET | Freq: Every day | ORAL | Status: DC

## 2012-07-29 MED ORDER — DILTIAZEM HCL ER COATED BEADS 240 MG PO TB24: 240.0000 mg | ORAL_TABLET | Freq: Every day | ORAL | Status: DC

## 2012-07-29 MED ORDER — LOSARTAN POTASSIUM 100 MG PO TABS: 100.0000 mg | ORAL_TABLET | Freq: Every day | ORAL | Status: DC

## 2012-07-29 MED ORDER — POTASSIUM CHLORIDE CRYS ER 20 MEQ PO TBCR: 20.0000 meq | EXTENDED_RELEASE_TABLET | Freq: Every day | ORAL | Status: DC

## 2012-07-29 MED ORDER — METOPROLOL SUCCINATE ER 100 MG PO TB24: 100.0000 mg | ORAL_TABLET | Freq: Every day | ORAL | Status: DC

## 2012-08-08 ENCOUNTER — Telehealth: Payer: Self-pay | Admitting: *Deleted

## 2012-08-08 DIAGNOSIS — F419 Anxiety disorder, unspecified: Secondary | ICD-10-CM

## 2012-08-08 NOTE — Telephone Encounter (Signed)
Message copied by Earvin Hansen on Fri Aug 08, 2012  6:07 PM ------      Message from: Darlin Coco      Created: Mon Jul 28, 2012  9:28 PM       Please report to patient.  The recent labs are stable. Continue same medication and careful diet. LDL and cholesterol are still too high.  Stop lovastatin and switch to Crestor 5 mg daily. Recheck LP and HFP  2 months after switch.      BUN high--drink more water.

## 2012-08-08 NOTE — Telephone Encounter (Signed)
Advised patient of lab results  Patient just refilled for 90 days Rx for Lovastatin,  Will finish and then call for Crestor. Ok per  [name]

## 2012-08-18 MED ORDER — ALPRAZOLAM 0.25 MG PO TABS: 0.2500 mg | ORAL_TABLET | Freq: Every day | ORAL | Status: DC | PRN

## 2012-08-22 ENCOUNTER — Telehealth: Payer: Self-pay | Admitting: Family Medicine

## 2012-08-22 NOTE — Telephone Encounter (Signed)
Patient Information:  Caller Name: Francis Campbell  Phone: [phone]  Patient: Francis Campbell, Francis Campbell  Gender: Male  DOB: 01-19-1946  Age: 65 Years  PCP: [name] (Family Practice)   Symptoms  Reason For Call & Symptoms: Cold symptoms with cough; wants to take something that will not affect his blood pressure for relief  Reviewed Health History In EMR: Yes  Reviewed Medications In EMR: Yes  Reviewed Allergies In EMR: Yes  Reviewed Surgeries / Procedures: Yes  Date of Onset of Symptoms: 08/18/2012  Any Fever: Yes  Fever Taken: Tactile  Fever Time Of Reading: 10:00:00  Fever Last Reading: N/A  Guideline(s) Used:  Cough  Disposition Per Guideline:   See Today or Tomorrow in Office  Reason For Disposition Reached:   Continuous (nonstop) coughing interferes with work or school and no improvement using cough treatment per Care Advice  Advice Given:  Avoid Tobacco Smoke:  Smoking or being exposed to smoke makes coughs much worse.  Prevent Dehydration:  Drink adequate liquids.  This will help soothe an irritated or dry throat and loosen up the phlegm.  Coughing Spasms:  Drink warm fluids. Inhale warm mist (Reason: both relax the airway and loosen up the phlegm).  Caution - Dextromethorphan:   Do not try to completely suppress coughs that produce mucus and phlegm. Remember that coughing is helpful in bringing up mucus from the lungs and preventing pneumonia.  Research Notes: Dextromethorphan in some research studies has been shown to reduce the frequency and severity of cough in adults (18 years or older) without significant adverse effects. However, other studies suggest that dextromethorphan is no better than placebo at reducing a cough.  Drug Abuse Potential: It should be noted that dextromethorphan has become a drug of abuse. This problem is seen most often in adolescents. Overdose symptoms can range from giggling and euphoria to hallucinations and coma.  OTC Cough Syrup -  Dextromethorphan:  Cough syrups containing the cough suppressant dextromethorphan (DM) may help decrease your cough. Cough syrups work best for coughs that keep you awake at night. They can also sometimes help in the late stages of a respiratory infection when the cough is dry and hacking. They can be used along with cough drops.  Examples: Benylin, Robitussin DM, Vicks 44 Cough Relief  Read the package instructions for dosage, contraindications, and other important information.  Cough Medicines:  OTC Cough Syrups: The most common cough suppressant in OTC cough medications is dextromethorphan. Often the letters "DM" appear in the name.  OTC Cough Drops: Cough drops can help a lot, especially for mild coughs. They reduce coughing by soothing your irritated throat and removing that tickle sensation in the back of the throat. Cough drops also have the advantage of portability - you can carry them with you.  Home Remedy - Hard Candy: Hard candy works just as well as medicine-flavored OTC cough drops. Diabetics should use sugar-free candy.  Home Remedy - Honey: This old home remedy has been shown to help decrease coughing at night. The adult dosage is 2 teaspoons (10 ml) at bedtime. Honey should not be given to infants under one year of age.  Reassurance  Coughing is the way that our lungs remove irritants and mucus. It helps protect our lungs from getting pneumonia.  You can get a dry hacking cough after a chest cold. Sometimes this type of cough can last 1-3 weeks, and be worse at night.  You can also get a cough after being exposed to irritating  substances like smoke, strong perfumes, and dust.  Here is some care advice that should help.  Office Follow Up:  Does the office need to follow up with this patient?: No  Instructions For The Office: N/A  Patient Refused Recommendation:  Patient Will Make Own Appointment  Prefers home care measures for now krs/can  RN Note:  Has scant nasal blood  noted; states takes coumadin.  No blood tinged sputum noted.  Recommended appt today per protocol; prefers home care measures.  Home care advised per patient request.  krs/can

## 2012-08-22 NOTE — Telephone Encounter (Signed)
Spoke with patient and he will try Mucinex over the weekend and if no improvement  He will call the office on Monday.

## 2012-08-25 ENCOUNTER — Ambulatory Visit (INDEPENDENT_AMBULATORY_CARE_PROVIDER_SITE_OTHER): Payer: 59

## 2012-08-25 DIAGNOSIS — Z7901 Long term (current) use of anticoagulants: Secondary | ICD-10-CM

## 2012-08-25 DIAGNOSIS — I4891 Unspecified atrial fibrillation: Secondary | ICD-10-CM

## 2012-08-25 DIAGNOSIS — I480 Paroxysmal atrial fibrillation: Secondary | ICD-10-CM

## 2012-09-08 ENCOUNTER — Telehealth: Payer: Self-pay | Admitting: Cardiology

## 2012-09-08 NOTE — Telephone Encounter (Signed)
The patient may hold his Coumadin for 5 days prior to lithotripsy and 2 days postop.  The patient is cleared from a cardiac standpoint for lithotripsy.

## 2012-09-08 NOTE — Telephone Encounter (Signed)
PT TO HAVE LITHOTRIPSY BY DR WRENN, NEEDS TO  HOLD COUMADIN 5 PRIOR AND  TWO DAYS POST, ALSO NEEDS SURGICAL CLEARANCE, SAW BRACKBILL 07/2012, PLS SEND TO PAM @ [phone]

## 2012-09-08 NOTE — Telephone Encounter (Signed)
Will forward to  [name] for review

## 2012-09-08 NOTE — Telephone Encounter (Signed)
Left message with Pam, printed and will have  [name] sign and fax

## 2012-09-09 ENCOUNTER — Other Ambulatory Visit: Payer: Self-pay | Admitting: Urology

## 2012-09-09 ENCOUNTER — Encounter (HOSPITAL_COMMUNITY): Payer: Self-pay | Admitting: Pharmacy Technician

## 2012-09-11 NOTE — Pre-Procedure Instructions (Addendum)
Asked to bring blue folder the day of the procedure,insurance card,I.D. driver's license,wear comfortable clothing and have a driver for the day. Asked not to take Advil,Motrin,Ibuprofen,Aleve or any NSAIDS, Aspirin, or Toradol for 72 hours prior to procedure,  No vitamins or herbal medications 7 days prior to procedure. Stopping Coumadin and Aspirin per guidelines.  Instructed to take laxative per doctor's office instructions and eat a light dinner the evening before procedure.   To arrive at 1415  for lithotripsy procedure.

## 2012-09-15 ENCOUNTER — Encounter (HOSPITAL_COMMUNITY)
Admission: RE | Disposition: A | Discharge status: Home / Self Care | Payer: Self-pay | Source: Ambulatory Visit | Attending: Urology

## 2012-09-15 ENCOUNTER — Ambulatory Visit (HOSPITAL_COMMUNITY): Payer: 59

## 2012-09-15 ENCOUNTER — Ambulatory Visit (HOSPITAL_COMMUNITY)
Admission: RE | Admit: 2012-09-15 | Discharge: 2012-09-15 | Disposition: A | Discharge status: Home / Self Care | Payer: 59 | Source: Ambulatory Visit | Attending: Urology | Admitting: Urology

## 2012-09-15 DIAGNOSIS — Z7901 Long term (current) use of anticoagulants: Secondary | ICD-10-CM | POA: Insufficient documentation

## 2012-09-15 DIAGNOSIS — N200 Calculus of kidney: Secondary | ICD-10-CM | POA: Insufficient documentation

## 2012-09-15 DIAGNOSIS — I4891 Unspecified atrial fibrillation: Secondary | ICD-10-CM | POA: Insufficient documentation

## 2012-09-15 DIAGNOSIS — I10 Essential (primary) hypertension: Secondary | ICD-10-CM | POA: Insufficient documentation

## 2012-09-15 SURGERY — LITHOTRIPSY, ESWL
Anesthesia: LOCAL | Laterality: Left

## 2012-09-15 MED ORDER — ONDANSETRON HCL 4 MG/2ML IJ SOLN: 4.0000 mg | Freq: Four times a day (QID) | INTRAMUSCULAR | Status: DC | PRN

## 2012-09-15 MED ORDER — DIPHENHYDRAMINE HCL 25 MG PO CAPS
25.0000 mg | ORAL_CAPSULE | ORAL | Status: AC
  Administered 2012-09-15: 25 mg via ORAL
  Filled 2012-09-15: qty 1

## 2012-09-15 MED ORDER — DEXTROSE-NACL 5-0.45 % IV SOLN
INTRAVENOUS | Status: DC
  Administered 2012-09-15: 15:00:00 via INTRAVENOUS

## 2012-09-15 MED ORDER — ACETAMINOPHEN 650 MG RE SUPP
650.0000 mg | RECTAL | Status: DC | PRN
  Filled 2012-09-15: qty 1

## 2012-09-15 MED ORDER — CIPROFLOXACIN HCL 500 MG PO TABS
500.0000 mg | ORAL_TABLET | ORAL | Status: AC
  Administered 2012-09-15: 500 mg via ORAL
  Filled 2012-09-15: qty 1

## 2012-09-15 MED ORDER — SODIUM CHLORIDE 0.9 % IJ SOLN: 3.0000 mL | INTRAMUSCULAR | Status: DC | PRN

## 2012-09-15 MED ORDER — ACETAMINOPHEN 325 MG PO TABS: 650.0000 mg | ORAL_TABLET | ORAL | Status: DC | PRN

## 2012-09-15 MED ORDER — DIAZEPAM 5 MG PO TABS
10.0000 mg | ORAL_TABLET | ORAL | Status: AC
  Administered 2012-09-15: 10 mg via ORAL
  Filled 2012-09-15: qty 2

## 2012-09-15 MED ORDER — SODIUM CHLORIDE 0.9 % IJ SOLN: 3.0000 mL | Freq: Two times a day (BID) | INTRAMUSCULAR | Status: DC

## 2012-09-15 MED ORDER — FENTANYL CITRATE 0.05 MG/ML IJ SOLN: 25.0000 ug | INTRAMUSCULAR | Status: DC | PRN

## 2012-09-15 MED ORDER — OXYCODONE HCL 5 MG PO TABS: 5.0000 mg | ORAL_TABLET | ORAL | Status: DC | PRN

## 2012-09-15 MED ORDER — SODIUM CHLORIDE 0.9 % IV SOLN: 250.0000 mL | INTRAVENOUS | Status: DC | PRN

## 2012-09-15 NOTE — Interval H&P Note (Signed)
History and Physical Interval Note:  09/15/2012 5:06 PM  Francis Campbell  has presented today for surgery, with the diagnosis of left renal stones  The various methods of treatment have been discussed with the patient and family. After consideration of risks, benefits and other options for treatment, the patient has consented to  Procedure(s) (LRB) with comments: EXTRACORPOREAL SHOCK WAVE LITHOTRIPSY (ESWL) (Left) as a surgical intervention .  The patient's history has been reviewed, patient examined, no change in status, stable for surgery.  I have reviewed the patient's chart and labs.  Questions were answered to the patient's satisfaction.     Jenene Kauffmann J

## 2012-09-15 NOTE — H&P (Signed)
ctive Problems Problems  1. Bladder Neck Obstruction 596.0 2. Hydronephrosis On The Left 591 3. Nephrolithiasis 592.0 4. Nephrolithiasis Of Both Kidneys 592.0 5. Organic Impotence 607.84 6. PSA,Elevated 790.93 7. Pyuria 791.9 8. Simple Renal Cyst 593.2 9. Taking Blood Thinners For A Long Time V58.61 10. Ureteral Stone Left 592.1  History of Present Illness  Francis Campbell returns today in f/u.  He was seen on 12/19 for moderate left flank pain and was found to have a 5x34m left mid ureteral stone and a possible distal stone.  He had some hematuria as well.  His pain has now resolved.  He has been using the tamsulosin but he hasn't needed the pain med.   Past Medical History Problems  1. History of  Atrial Fibrillation 427.31 2. 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 History of  Gross Hematuria 599.71 4. History of  Hematuria 599.70 5. History of  Hypertension 401.9 6. History of  Microscopic Hematuria 599.72 7. History of  Murmurs 785.2  Surgical History Problems  1. History of  Inguinal Hernia Repair Bilateral 2. History of  Rotator Cuff Repair  Current Meds 1. ALPRAZolam 0.25 MG Oral Tablet; Therapy: 05Apr2012 to 2. AmLODIPine Besylate 5 MG Oral Tablet; Therapy: 21Dec2012 to 3. Aspirin 81 MG Oral Tablet; Therapy: (Recorded:13Dec2007) to 4. Centrum TABS; Therapy: (Recorded:02Nov2011) to 5. Furosemide 20 MG Oral Tablet; Therapy: 18Sep2013 to 6. Hydrocodone-Acetaminophen 5-500 MG Oral Tablet; Therapy: (Recorded:19Dec2013) to 7. Hydrocodone-Acetaminophen 7.5-325 MG Oral Tablet; TAKE 1 TO 2 TABLETS EVERY 4 TO 6  HOURS AS NEEDED FOR PAIN; Therapy: 135TIR4431to (Evaluate:23Dec2013); Last  Rx:19Dec2013 8. Klor-Con M20 20 MEQ Oral Tablet Extended Release; Therapy: 054MGQ6761to 9. Losartan Potassium 50 MG Oral Tablet; Therapy: (Recorded:19Dec2013) to 10. Lovastatin 20 MG Oral Tablet; Therapy: 12Aug2013 to 11. Matzim LA 240 MG Oral Tablet Extended Release 24 Hour; Therapy:  15Sep2013 to 12. Metoprolol Succinate ER 100 MG Oral Tablet Extended Release 24 Hour; Therapy:   (Recorded:18Mar2013) to 13. Ondansetron 8 MG Oral Tablet Dispersible; TAKE 1 TABLET Every 6 hours PRN; Therapy:   [id number] (Last Rx:19Dec2013)  Requested for: [id number]. Sertraline HCl 50 MG Oral Tablet; Therapy: 138SNK5397to 15. Tamsulosin HCl 0.4 MG Oral Capsule; TAKE 1 CAPSULE BY MOUTH  DAILY; Therapy:   [id number] (Last Rx:19Dec2013)  Requested for: [id number]. Vitamin D3 1000 UNIT Oral Capsule; Therapy: (Recorded:15Feb2011) to 17. Warfarin Sodium 5 MG Oral Tablet; Therapy: (Recorded:15Feb2011) to  Allergies Medication  1. Floxin TABS  Family History Problems  1. Paternal history of  Nephrolithiasis 2. Fraternal history of  Nephrolithiasis  Social History Problems  1. Marital History - Currently Married 2. Never A Smoker 3. 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 Alcohol Use 5. Caffeine Use 6. Tobacco Use V15.82  Review of Systems  Gastrointestinal: nausea.  Constitutional: no fever.  Cardiovascular: no chest pain and no palpitations.  Respiratory: shortness of breath.    Vitals Vital Signs [Data Includes: Last 1 Day]  23Dec2013 08:22AM  Blood Pressure: 109 / 71 Heart Rate: 61  Physical Exam Constitutional: Well nourished and well developed . No acute distress.  Pulmonary: No respiratory distress and normal respiratory rhythm and effort.  Cardiovascular: Heart rate and rhythm are normal . No peripheral edema.  Abdomen: The abdomen is soft and nontender. No masses are palpated. No CVA tenderness. No hernias are palpable. No hepatosplenomegaly noted.    Results/Data Urine [Data Includes: Last 1 Day]   [id number] COLOR YELLOW   APPEARANCE CLEAR  SPECIFIC GRAVITY 1.015   pH 6.0   GLUCOSE NEG mg/dL  BILIRUBIN NEG   KETONE NEG mg/dL  BLOOD LARGE   PROTEIN NEG mg/dL  UROBILINOGEN 0.2 mg/dL  NITRITE NEG   LEUKOCYTE ESTERASE NEG   SQUAMOUS EPITHELIAL/HPF  RARE   WBC 0-2 WBC/hpf  RBC 21-50 RBC/hpf  BACTERIA RARE   CRYSTALS NONE SEEN   CASTS NONE SEEN    The following images/tracing/specimen were independently visualized:  KUB today shows a possible stone in the bladder but the bladder calcification could be a phlebolith. On CT there was a 3-566m stone in the bladder. The left UPJ stone that measures about 654mis now back in the renal pelvis and not at the UPJ but there appears to be an additional 8-66m56mtone adjacent to it along with a stable 6-7mm64mP stone and smaller left renal stones. There are small right renal stones. He has multple hernia screws as well. No other significant findings are noted.    Assessment Assessed  1. Nephrolithiasis 592.0   He has a left renal pelvic stone and actually possible 2.  There may still be a stone in the bladder as well that should be small enough to pass. He is on warfarin.   Plan Health Maintenance (V70.0)  1. UA With REFLEX  Done: [id number] Nephrolithiasis (592.0)  2. Follow-up MD/NP/PA Office  Follow-up  Requested for: 23Dec2013 3. Follow-up Schedule Surgery Office  Follow-up  Requested for: 23Dec2013 4. KUB  Requested for: 23Dec2013 5. RENAL U/S LEFT  Requested for: 23Dec2013   I discussed the options for treating the left renal pelvic stones including ureteroscopy, PCNL and ESWL.   I believe ESWL would be the least invasive initial option and reviewed the risks of bleeding, infection, renal and adjacent organ injury, failure of fragmentation and obstructing fragments with need for secondary procedures, thrombotic events and anesthetic complications. He will need to be off of the warfarin for 5 days preop and then 2 days postop until a renal US cKoreafirms no hematoma. He will need to be cleared by Dr. BracMare FerrariDiscussion/Summary  CC: Dr. Dr. BrucCarolann Littler Dr. Tom Warren Danes

## 2012-09-15 NOTE — Interval H&P Note (Signed)
History and Physical Interval Note:  09/15/2012 5:07 PM  Francis Campbell  has presented today for surgery, with the diagnosis of left renal stones  The various methods of treatment have been discussed with the patient and family. After consideration of risks, benefits and other options for treatment, the patient has consented to  Procedure(s) (LRB) with comments: EXTRACORPOREAL SHOCK WAVE LITHOTRIPSY (ESWL) (Left) as a surgical intervention .  The patient's history has been reviewed, patient examined, no change in status, stable for surgery.  I have reviewed the patient's chart and labs.  Questions were answered to the patient's satisfaction.     [name] J

## 2012-10-03 ENCOUNTER — Emergency Department (HOSPITAL_COMMUNITY): Payer: BC Managed Care – PPO

## 2012-10-03 ENCOUNTER — Inpatient Hospital Stay (HOSPITAL_COMMUNITY)
Admission: EM | Admit: 2012-10-03 | Discharge: 2012-10-06 | DRG: 138 | Disposition: A | Discharge status: Home / Self Care | Payer: BC Managed Care – PPO | Attending: Cardiology | Admitting: Cardiology

## 2012-10-03 ENCOUNTER — Encounter (HOSPITAL_COMMUNITY): Payer: Self-pay | Admitting: Family Medicine

## 2012-10-03 DIAGNOSIS — E7800 Pure hypercholesterolemia, unspecified: Secondary | ICD-10-CM | POA: Diagnosis present

## 2012-10-03 DIAGNOSIS — Z7982 Long term (current) use of aspirin: Secondary | ICD-10-CM | POA: Diagnosis not present

## 2012-10-03 DIAGNOSIS — Z8673 Personal history of transient ischemic attack (TIA), and cerebral infarction without residual deficits: Secondary | ICD-10-CM

## 2012-10-03 DIAGNOSIS — E237 Disorder of pituitary gland, unspecified: Secondary | ICD-10-CM | POA: Diagnosis present

## 2012-10-03 DIAGNOSIS — K5090 Crohn's disease, unspecified, without complications: Secondary | ICD-10-CM | POA: Diagnosis present

## 2012-10-03 DIAGNOSIS — I4891 Unspecified atrial fibrillation: Secondary | ICD-10-CM | POA: Diagnosis not present

## 2012-10-03 DIAGNOSIS — I4892 Unspecified atrial flutter: Secondary | ICD-10-CM | POA: Diagnosis not present

## 2012-10-03 DIAGNOSIS — Z79899 Other long term (current) drug therapy: Secondary | ICD-10-CM

## 2012-10-03 DIAGNOSIS — F411 Generalized anxiety disorder: Secondary | ICD-10-CM | POA: Diagnosis present

## 2012-10-03 DIAGNOSIS — I10 Essential (primary) hypertension: Secondary | ICD-10-CM | POA: Diagnosis present

## 2012-10-03 DIAGNOSIS — K219 Gastro-esophageal reflux disease without esophagitis: Secondary | ICD-10-CM | POA: Diagnosis present

## 2012-10-03 DIAGNOSIS — F419 Anxiety disorder, unspecified: Secondary | ICD-10-CM | POA: Insufficient documentation

## 2012-10-03 DIAGNOSIS — Z7901 Long term (current) use of anticoagulants: Secondary | ICD-10-CM | POA: Diagnosis not present

## 2012-10-03 DIAGNOSIS — I359 Nonrheumatic aortic valve disorder, unspecified: Secondary | ICD-10-CM | POA: Diagnosis present

## 2012-10-03 DIAGNOSIS — I959 Hypotension, unspecified: Secondary | ICD-10-CM

## 2012-10-03 HISTORY — DX: Disorder of pituitary gland, unspecified: E23.7

## 2012-10-03 HISTORY — DX: Unspecified atrial flutter: I48.92

## 2012-10-03 HISTORY — DX: Endocarditis, valve unspecified: I38

## 2012-10-03 HISTORY — DX: Transient cerebral ischemic attack, unspecified: G45.9

## 2012-10-03 LAB — BASIC METABOLIC PANEL
BUN: 21 mg/dL (ref 6–23)
CO2: 24 mEq/L (ref 19–32)
Chloride: 103 mEq/L (ref 96–112)
Creatinine, Ser: 1.02 mg/dL (ref 0.50–1.35)

## 2012-10-03 LAB — TROPONIN I: Troponin I: <0.3 ng/mL (ref <0.30)

## 2012-10-03 LAB — CBC
HCT: 47.5 % (ref 39.0–52.0)
MCV: 84.2 fL (ref 78.0–100.0)
Platelets: 207 10*3/uL (ref 150–400)
RBC: 5.64 MIL/uL (ref 4.22–5.81)
WBC: 10.2 10*3/uL (ref 4.0–10.5)

## 2012-10-03 LAB — D-DIMER, QUANTITATIVE: D-Dimer, Quant: <0.27 ug/mL-FEU (ref 0.00–0.48)

## 2012-10-03 LAB — MAGNESIUM: Magnesium: 1.9 mg/dL (ref 1.5–2.5)

## 2012-10-03 LAB — PRO B NATRIURETIC PEPTIDE: Pro B Natriuretic peptide (BNP): 1587 pg/mL — ABNORMAL HIGH (ref 0–125)

## 2012-10-03 LAB — LACTIC ACID, PLASMA: Lactic Acid, Venous: 1 mmol/L (ref 0.5–2.2)

## 2012-10-03 MED ORDER — SODIUM CHLORIDE 0.9 % IV BOLUS (SEPSIS)
1000.0000 mL | Freq: Once | INTRAVENOUS | Status: AC
  Administered 2012-10-03: 1000 mL via INTRAVENOUS

## 2012-10-03 MED ORDER — SIMVASTATIN 10 MG PO TABS
10.0000 mg | ORAL_TABLET | Freq: Every day | ORAL | Status: DC
  Administered 2012-10-04 – 2012-10-06 (×3): 10 mg via ORAL
  Filled 2012-10-03 (×3): qty 1

## 2012-10-03 MED ORDER — ONDANSETRON HCL 4 MG/2ML IJ SOLN: 4.0000 mg | Freq: Four times a day (QID) | INTRAMUSCULAR | Status: DC | PRN

## 2012-10-03 MED ORDER — DILTIAZEM HCL 25 MG/5ML IV SOLN
5.0000 mg | INTRAVENOUS | Status: AC
  Administered 2012-10-03: 5 mg via INTRAVENOUS
  Filled 2012-10-03: qty 5

## 2012-10-03 MED ORDER — ASPIRIN EC 81 MG PO TBEC
81.0000 mg | DELAYED_RELEASE_TABLET | Freq: Every day | ORAL | Status: DC
  Administered 2012-10-04 – 2012-10-06 (×3): 81 mg via ORAL
  Filled 2012-10-03 (×3): qty 1

## 2012-10-03 MED ORDER — SODIUM CHLORIDE 0.9 % IJ SOLN
3.0000 mL | Freq: Two times a day (BID) | INTRAMUSCULAR | Status: DC
  Administered 2012-10-03 – 2012-10-06 (×4): 3 mL via INTRAVENOUS

## 2012-10-03 MED ORDER — MUPIROCIN 2 % EX OINT
1.0000 "application " | TOPICAL_OINTMENT | Freq: Two times a day (BID) | CUTANEOUS | Status: DC
  Administered 2012-10-03 – 2012-10-06 (×6): 1 via NASAL
  Filled 2012-10-03: qty 22

## 2012-10-03 MED ORDER — SERTRALINE HCL 50 MG PO TABS
50.0000 mg | ORAL_TABLET | Freq: Every day | ORAL | Status: DC
  Administered 2012-10-03 – 2012-10-05 (×3): 50 mg via ORAL
  Filled 2012-10-03 (×4): qty 1

## 2012-10-03 MED ORDER — DILTIAZEM HCL 25 MG/5ML IV SOLN
15.0000 mg | Freq: Once | INTRAVENOUS | Status: AC
  Administered 2012-10-03: 10 mg via INTRAVENOUS
  Filled 2012-10-03: qty 5

## 2012-10-03 MED ORDER — SODIUM CHLORIDE 0.9 % IV BOLUS (SEPSIS): 1000.0000 mL | Freq: Once | INTRAVENOUS | Status: DC

## 2012-10-03 MED ORDER — SODIUM CHLORIDE 0.9 % IV SOLN: 250.0000 mL | INTRAVENOUS | Status: DC | PRN

## 2012-10-03 MED ORDER — DIGOXIN 0.25 MG/ML IJ SOLN
0.2500 mg | Freq: Once | INTRAMUSCULAR | Status: AC
  Administered 2012-10-03: 0.25 mg via INTRAVENOUS

## 2012-10-03 MED ORDER — WARFARIN - PHARMACIST DOSING INPATIENT
Freq: Every day | Status: DC
  Administered 2012-10-06: 18:00:00

## 2012-10-03 MED ORDER — SODIUM CHLORIDE 0.9 % IV SOLN
1.0000 g | Freq: Once | INTRAVENOUS | Status: AC
  Administered 2012-10-03: 1 g via INTRAVENOUS
  Filled 2012-10-03: qty 10

## 2012-10-03 MED ORDER — VITAMIN D3 25 MCG (1000 UT) PO CAPS: 2.0000 | ORAL_CAPSULE | Freq: Every day | ORAL | Status: DC

## 2012-10-03 MED ORDER — VITAMIN D3 25 MCG (1000 UNIT) PO TABS
2000.0000 [IU] | ORAL_TABLET | Freq: Every day | ORAL | Status: DC
  Administered 2012-10-04 – 2012-10-06 (×3): 2000 [IU] via ORAL
  Filled 2012-10-03 (×3): qty 2

## 2012-10-03 MED ORDER — ONE-DAILY MULTI VITAMINS PO TABS: 1.0000 | ORAL_TABLET | Freq: Every day | ORAL | Status: DC

## 2012-10-03 MED ORDER — ALPRAZOLAM 0.25 MG PO TABS
0.2500 mg | ORAL_TABLET | Freq: Every day | ORAL | Status: DC | PRN
  Administered 2012-10-03: 0.25 mg via ORAL
  Filled 2012-10-03: qty 1

## 2012-10-03 MED ORDER — ENOXAPARIN SODIUM 100 MG/ML ~~LOC~~ SOLN
1.0000 mg/kg | SUBCUTANEOUS | Status: AC
  Administered 2012-10-03: 85 mg via SUBCUTANEOUS
  Filled 2012-10-03: qty 1

## 2012-10-03 MED ORDER — WARFARIN SODIUM 10 MG PO TABS
10.0000 mg | ORAL_TABLET | Freq: Once | ORAL | Status: AC
  Administered 2012-10-03: 10 mg via ORAL
  Filled 2012-10-03: qty 1

## 2012-10-03 MED ORDER — ACETAMINOPHEN 325 MG PO TABS: 650.0000 mg | ORAL_TABLET | ORAL | Status: DC | PRN

## 2012-10-03 MED ORDER — METOPROLOL TARTRATE 25 MG PO TABS
25.0000 mg | ORAL_TABLET | Freq: Four times a day (QID) | ORAL | Status: DC | PRN
  Administered 2012-10-04: 25 mg via ORAL
  Filled 2012-10-03: qty 1

## 2012-10-03 MED ORDER — ENOXAPARIN SODIUM 100 MG/ML ~~LOC~~ SOLN
1.0000 mg/kg | Freq: Two times a day (BID) | SUBCUTANEOUS | Status: DC
  Administered 2012-10-04 – 2012-10-05 (×3): 85 mg via SUBCUTANEOUS
  Filled 2012-10-03 (×6): qty 1

## 2012-10-03 MED ORDER — ADULT MULTIVITAMIN W/MINERALS CH
1.0000 | ORAL_TABLET | Freq: Every day | ORAL | Status: DC
  Administered 2012-10-04 – 2012-10-06 (×3): 1 via ORAL
  Filled 2012-10-03 (×3): qty 1

## 2012-10-03 MED ORDER — DILTIAZEM LOAD VIA INFUSION: 5.0000 mg | Freq: Once | INTRAVENOUS | Status: DC

## 2012-10-03 MED ORDER — SODIUM CHLORIDE 0.9 % IJ SOLN: 3.0000 mL | INTRAMUSCULAR | Status: DC | PRN

## 2012-10-03 MED ORDER — DIGOXIN 0.25 MG/ML IJ SOLN
0.2500 mg | Freq: Once | INTRAMUSCULAR | Status: AC
  Administered 2012-10-03: 0.25 mg via INTRAVENOUS
  Filled 2012-10-03: qty 2

## 2012-10-03 MED ORDER — CHLORHEXIDINE GLUCONATE CLOTH 2 % EX PADS
6.0000 | MEDICATED_PAD | Freq: Every day | CUTANEOUS | Status: DC
  Administered 2012-10-04 – 2012-10-06 (×3): 6 via TOPICAL

## 2012-10-03 MED ORDER — ASPIRIN 81 MG PO TABS: 81.0000 mg | ORAL_TABLET | Freq: Every morning | ORAL | Status: DC

## 2012-10-03 MED ORDER — DILTIAZEM HCL 25 MG/5ML IV SOLN: 10.0000 mg | Freq: Once | INTRAVENOUS | Status: DC

## 2012-10-03 NOTE — ED Notes (Signed)
Family at bedside. 

## 2012-10-03 NOTE — ED Notes (Signed)
Pt ambulated to restroom, Felt worn out on return.

## 2012-10-03 NOTE — H&P (Signed)
History and Physical  Patient ID: Francis Campbell MRN: [id number], DOB: 1946/01/06 Date of Encounter: 10/03/2012, 3:10 PM Primary Physician: [name], MD Primary Cardiologist: Deaglan Lile  Chief Complaint: weakness Reason for Admit: rapid atrial flutter  HPI: Francis Campbell is a 67 y/o M with history of paroxysmal atrial fib previously treated with flecainide "pill-in-pocket" approach, normal stress testing 2012, HL, mild AI, possible prior TIA who presented to [facility] with complaints of weakness. He has not had to take flecainide in about a year until this week. 2 days ago, he awoke in the morning with the sensation that his heart was out of rhythm so he took 333m of flecainide. He was able to go on with his usual activities including work, but had not converted so last night he took another 3035mof flecainide. When he awoke this morning, he didn't feel quite right (weak). When he stood up, he felt like the room was tilting. He began to get ready for work and stepped in the shower but had to sit down due to feeling weak. He is not sure how long he was actually sitting down in the shower but does not think he passed out. He got up, dried off, took his AM medicines, then alerted his wife who drove him to the ER. He has had the usual slight chest pressure that he typically has with his afib, but it is not worse than usual & is not exacerbated by activity or inspiration. No SOB, nausea, vomiting, diarrhea, weight changes, recent cough, LEE or LE erythema, fever, chills, recent travel, bedrest, personal/family history of blood clotting. He did have lithotripsy in 08/2012 but has done well since then. Here in the ER, EKG demonstrates a regular tachycardia at this time felt atrial flutter with 2:1 conduction, HR in the 120's. He was initially treated with 1545mV cardizem but developed hypotension with lowest BP of 65/42. He was treated with 2L NS (on 3rd bag now), and 1g calcium gluconate with  improvement in BP to 94/72. CXR demonstrated bronchitic, old granulomatous and probable emphysematous changes without acute abnormalities. Troponin neg x 1, INR 1.96, CBC WNL, BMET unremarkable except glu 114. D-dimer negative. pBNP 1587, lactic acid 1.0, Mg 1.9. He does not check his BP regularly. No other unusual changes or symptoms lately. He reports medication compliance.   Past Medical History  Diagnosis Date  . Paroxysmal atrial fibrillation     Takes PRN flecainide  . Hypercholesteremia   . Hypertension   . Valvular heart disease     Mild AI, AS by echo 10/2011  . Nephrolithiasis     s/p lithotripsy, followed by urology  . Mini stroke     ~2005 - had visual symptoms, was told by Dr. BraMare Ferrariis may have been a TIA but it's unclear  . Crohn disease     Remotely - in the 1990s  . Esophageal reflux     Remotely     Most Recent Cardiac Studies: 2D Echo 11/01/11 Study Conclusions - Left ventricle: Slight upper septal thickening. The cavity size was normal. The estimated ejection fraction was 60%. Wall motion was normal; there were no regional wall motion abnormalities. - Aortic valve: Mild sclerosis without stenosis. Mild regurgitation. - Aorta: Slight dilitation at sinus of Valsalva. Otherwise, aortic size is normal. - Pulmonary arteries: PA peak pressure: 46m27m (S).  Nuc 08/2011 Impression  Exercise Capacity: Good exercise capacity.  BP Response: Normal blood pressure response.  Clinical Symptoms: No chest  pain.  ECG Impression: No significant ST segment change suggestive of ischemia.  Comparison with Prior Nuclear Study: No significant change from previous study  Overall Impression: Normal stress nuclear study. Good exercise tolerance. No ischemia. Normal LV systolic function. EF 62%.   Surgical History:  Past Surgical History  Procedure Date  . Arthoscopic rotaor cuff repair   . Tonsillectomy   . Rotator cuff repair 2000  . Knee surgery 1990, 2000  . Hernia repair  2005  . Eye surgery 1950, [place] Meds: Prior to Admission medications   Medication Sig Start Date End Date Taking? Authorizing Provider  ALPRAZolam (XANAX) 0.25 MG tablet Take 1 tablet (0.25 mg total) by mouth daily as needed. 08/08/12  Yes Darlin Coco, MD  amLODipine (NORVASC) 5 MG tablet Take 5 mg by mouth every morning.   Yes Historical Provider, MD  aspirin 81 MG tablet Take 81 mg by mouth every morning.    Yes Historical Provider, MD  Cholecalciferol (VITAMIN D3) 1000 UNITS CAPS Take 2 capsules by mouth daily.     Yes Historical Provider, MD  diltiazem (CARDIZEM CD) 240 MG 24 hr capsule Take 240 mg by mouth every morning.   Yes Historical Provider, MD  flecainide (TAMBOCOR) 100 MG tablet Take 300 mg by mouth daily as needed. Take 3 tablets at onset of atrial fibrillation.   Yes Historical Provider, MD  furosemide (LASIX) 20 MG tablet Take 20 mg by mouth every morning.   Yes Historical Provider, MD  losartan (COZAAR) 100 MG tablet Take 100 mg by mouth every morning.   Yes Historical Provider, MD  lovastatin (MEVACOR) 20 MG tablet Take 20 mg by mouth every morning.   Yes Historical Provider, MD  metoprolol succinate (TOPROL-XL) 100 MG 24 hr tablet Take 100 mg by mouth every morning. Take with or immediately following a meal.   Yes Historical Provider, MD  Multiple Vitamin (MULTIVITAMIN) tablet Take 1 tablet by mouth daily.   Yes Historical Provider, MD  potassium chloride SA (K-DUR,KLOR-CON) 20 MEQ tablet Take 1 tablet (20 mEq total) by mouth daily. 07/29/12  Yes Darlin Coco, MD  sertraline (ZOLOFT) 50 MG tablet Take 50 mg by mouth at bedtime.    Yes Historical Provider, MD  Tamsulosin HCl (FLOMAX) 0.4 MG CAPS Take 0.4 mg by mouth daily after breakfast.   Yes Historical Provider, MD  warfarin (COUMADIN) 10 MG tablet Take 5-10 mg by mouth daily. Take 70m on Mon, wed, fri sat, and take 5 mg on Tuesday Thursday and Sunday.   Yes Historical Provider, MD    Allergies:    Allergies  Allergen Reactions  . Amiodarone     Abn  thyroid  . Floxin (Ofloxacin)     hallucinations  . Lisinopril Cough  . Tavist Allergy (Clemastine Fumarate)     A fib worse  . Chocolate Palpitations  . Dextromethorphan Palpitations  . Robitussin Cf (Pseudoephedrine-Dm-Gg) Palpitations    History   Social History  . Marital Status: Married    Spouse Name: N/A    Number of Children: 2  . Years of Education: N/A   Occupational History  . 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 Smoking status: Never Smoker   . Smokeless tobacco: Never Used  . Alcohol Use: No  . Drug Use: No  . Sexually Active: Yes    Birth Control/ Protection: None   Other Topics Concern  . Not on file   Social  History Narrative   Non smoker  No ets      Family History  Problem Relation Age of Onset  . Stroke Mother   . Hypertension Mother   . Kidney disease Mother     stones  . Nephrolithiasis Brother   . Arthritis Paternal Grandmother   . Heart disease Paternal Grandmother   . Leukemia Father   . Heart disease Father   . Nephrolithiasis Father     Review of Systems: General: negative for chills, fever, night sweats or weight changes.  Cardiovascular: see above Dermatological: negative for rash Respiratory: negative for cough or wheezing Urologic: negative for frank hematuria (has history of microscopic hematuria) Abdominal: negative for nausea, vomiting, diarrhea, bright red blood per rectum, melena, or hematemesis Neurologic: see above All other systems reviewed and are otherwise negative except as noted above.  Labs:   Lab Results  Component Value Date   WBC 10.2 10/03/2012   HGB 16.3 10/03/2012   HCT 47.5 10/03/2012   MCV 84.2 10/03/2012   PLT 207 10/03/2012     Lab 10/03/12 0817  NA 139  K 3.8  CL 103  CO2 24  BUN 21  CREATININE 1.02  CALCIUM 9.1  PROT --  BILITOT --  ALKPHOS --  ALT --  AST --  GLUCOSE 114*    Basename 10/03/12 0817   CKTOTAL --  CKMB --  TROPONINI <0.30   Radiology/Studies:  Dg Chest Portable 1 View 10/03/2012  *RADIOLOGY REPORT*  Clinical Data:  Atrial fibrillation, tachycardia  PORTABLE CHEST - 1 VIEW  Comparison: Portable exam 0851 hours compared to 10/25/2011  Findings: Normal heart size, mediastinal contours, and pulmonary vascularity. Bronchitic and probable emphysematous changes. Calcified granuloma right base. No pulmonary infiltrate, pleural effusion, or pneumothorax. Bones unremarkable.  IMPRESSION: Bronchitic, old granulomatous and probable emphysematous changes. No acute abnormalities.   Original Report Authenticated By: Lavonia Dana, M.D.     EKG: regular tachycardia 128bpm - at this time felt atrial flutter with 2:1 conduction as opposed to sinus tach - he has not slowed down to be able to determine flutter waves  Physical Exam: Blood pressure 89/64, pulse 128, temperature 97.4 F (36.3 C), temperature source Oral, resp. rate 14, SpO2 100.00%. General: Well developed, well nourished WM laying flat in bed in no acute distress. Head: Normocephalic, atraumatic, sclera non-icteric, no xanthomas, nares are without discharge.  Neck: Negative for carotid bruits. JVD mildly elevated. Lungs: Clear bilaterally to auscultation without wheezes, rales, or rhonchi. Breathing is unlabored. Heart: Regular but tachycardic with S1 S2. No murmurs, rubs, or gallops appreciated. Abdomen: Soft, non-tender, non-distended with normoactive bowel sounds. No hepatomegaly. No rebound/guarding. No obvious abdominal masses. Msk:  Strength and tone appear normal for age. Extremities: No clubbing or cyanosis. No edema.  Distal pedal pulses are 2+ and equal bilaterally. Neuro: Alert and oriented X 3. No focal deficit. No facial asymmetry. Moves all extremities spontaneously. Psych:  Responds to questions appropriately with a normal affect.    ASSESSMENT AND PLAN:   1. Tachycardia likely atrial flutter with 2:1  conduction 2. Hypotension 3. History of PAF  3. HTN 4. Possible prior TIA ~2005 5. Mild AI/AS by echo 10/2011  Discussed with Dr. Percival Spanish. Symptoms likely reflect his atrial flutter, rapid HR and subsequent hypotension. TEE/DCCV cannot be performed today due to scheduling issues. His INR is subtherapeutic and per his symptom timeline, he has been in this rhythm greater than 48 hours. Will rx 6m IV diltiazem and 0.265mIV digoxin  now and follow rhythm. Will change Toprol to Lopressor 110m PO q6hr PRN SBP>100 and hold his other antihypertensive medicines for now. If he has not spontaneously converted over the weekend, will likely need TEE/DCCV on Monday. See below for additional thoughts.  Signed, Dayna Dunn PA-C 10/03/2012, 3:10 PM  History and all data above reviewed.  Patient examined.  I agree with the findings as above.  The patient reports an irregular rhythm for greater than 48 hours.  He did take Flecainide "pill in pocket" the last two evenings with resolution of his arrhythmia.  He did develop an more regular rapid rate and came to the ER with this.  He is in atrial flutter.  He became hypotensive with IV Cardizem  The patient exam reveals COR:RRR  ,  Lungs: Clear  ,  Abd: Positive bowel sounds, no rebound no guarding, Ext No edema  .  All available labs, radiology testing, previous records reviewed. Agree with documented assessment and plan. Atrial flutter.  I suspect that it was fib at one point.  He has been in this rhythm for greater than 48 hours and his INR is less than 2.  He needs a TEE DCCV.  I will admit him and attempt rate control with a very low dose of IV Cardizem and dig.  I could not schedule TEE for today and he will have this scheduled for Monday if he needs it.    JJeneen RinksHochrein  3:26 PM  10/03/2012

## 2012-10-03 NOTE — ED Notes (Signed)
Pt ambulated to RR .  

## 2012-10-03 NOTE — Progress Notes (Signed)
ANTICOAGULATION CONSULT NOTE - Initial Consult  Pharmacy Consult for lovenox/coumadin Indication: PAF  Allergies  Allergen Reactions  . Amiodarone     Abn  thyroid  . Floxin (Ofloxacin)     hallucinations  . Lisinopril Cough  . Tavist Allergy (Clemastine Fumarate)     A fib worse  . Chocolate Palpitations  . Dextromethorphan Palpitations  . Robitussin Cf (Pseudoephedrine-Dm-Gg) Palpitations    Patient Measurements:   Heparin Dosing Weight: 86kg  Vital Signs: Temp: 97.4 F (36.3 C) (01/17 0815) Temp src: Oral (01/17 0815) BP: 99/70 mmHg (01/17 1515) Pulse Rate: 126  (01/17 1515)  Labs:  [facility] 10/03/12 0817  HGB 16.3  HCT 47.5  PLT 207  APTT --  LABPROT 21.6*  INR 1.96*  HEPARINUNFRC --  CREATININE 1.02  CKTOTAL --  CKMB --  TROPONINI <0.30    The CrCl is unknown because both a height and weight (above a minimum accepted value) are required for this calculation.   Medical History: Past Medical History  Diagnosis Date  . Paroxysmal atrial fibrillation     Takes PRN flecainide  . Hypercholesteremia   . Hypertension   . Valvular heart disease     Mild AI, AS by echo 10/2011  . Nephrolithiasis     s/p lithotripsy, followed by urology  . Mini stroke     ~2005 - had visual symptoms, was told by [name] this may have been a TIA but it's unclear  . Crohn disease     Remotely - in the 1990s  . Esophageal reflux     Remotely    Medications:   (Not in a hospital admission) Scheduled:    . digoxin  0.25 mg Intravenous Once  . TDaP  0.5 mL Intramuscular Once    Assessment: 67 yo with hx PAF who was admitted for rapid aflutter. He has been on chronic coumadin PTA. His INR is 1.96 today. Plan to bridge with lovenox/coumadin until INR is therapeutic. Plan for TEE/DCCV on Monday.  PTA coumadin = 37m qday except 524mTTSun  Goal of Therapy:  Anti-Xa level 0.6-1.2 units/ml 4hrs after LMWH dose given Monitor platelets by anticoagulation protocol:  Yes   Plan:   1. Lovenox 8552mQ q12 2. Change coumadin to 55m84may except for 5mg 41mMF but will give 55mg 72my. 3. Daily INR  Pham, Onnie Boer [place]2014,3:41 PM

## 2012-10-03 NOTE — ED Notes (Signed)
MD at bedside. 

## 2012-10-03 NOTE — Progress Notes (Signed)
Patient will be put on schedule for TEE/DCCV on Monday 10/06/12. Order was placed to keep NPO after midnight that day except for meds. If he does not convert over the weekend, will need orders for TEE depending on clinical situation. Patryck Kilgore PA-C

## 2012-10-03 NOTE — ED Provider Notes (Signed)
History     CSN: [id number]  Arrival date & time 10/03/12  0808   First MD Initiated Contact with Patient 10/03/12 [phone]      Chief Complaint  Patient presents with  . Atrial Fibrillation     The history is provided by the patient.   patient reports a sense of palpitations for the past 48 hours.  He has a history of paroxysmal atrial fibrillation it feels like his back in atrial fibrillation.  He had mild shortness of breath and generalized weakness with some lightheadedness.  He states his symptoms are consistent with his prior atrial fibrillation.  No syncope.  No chest pain or chest tightness.  He took doses of flecainide last night as well as the day before without improvement in his symptoms.  He now states that he became very lightheaded in the shower and came to the emergency department for evaluation.  He denies recent fevers or chills.  He has no other complaints.  His symptoms are mild to moderate in severity.  Nothing worsens or improves his symptoms.    Past Medical History  Diagnosis Date  . Paroxysmal atrial fibrillation   . Hypercholesteremia   . Hypertension   . Aortic insufficiency   . Systolic murmur   . Kidney stone 2003  . Mini stroke   . Crohn disease   . Esophageal reflux     Past Surgical History  Procedure Date  . Arthoscopic rotaor cuff repair   . Tonsillectomy   . Rotator cuff repair 2000  . Knee surgery 1990, 2000  . Hernia repair 2005  . Eye surgery 1950, 1960, 1964    Family History  Problem Relation Age of Onset  . Stroke Mother   . Hypertension Mother   . Kidney disease Mother     stones  . Nephrolithiasis Brother   . Arthritis Paternal Grandmother   . Heart disease Paternal Grandmother   . Leukemia Father   . Heart disease Father   . Nephrolithiasis Father     History  Substance Use Topics  . Smoking status: Never Smoker   . Smokeless tobacco: Never Used  . Alcohol Use: No      Review of Systems  All other systems  reviewed and are negative.    Allergies  Amiodarone; Floxin; Lisinopril; Tavist allergy; Chocolate; Dextromethorphan; and Robitussin cf  Home Medications   Current Outpatient Rx  Name  Route  Sig  Dispense  Refill  . ALPRAZOLAM 0.25 MG PO TABS   Oral   Take 1 tablet (0.25 mg total) by mouth daily as needed.   90 tablet   1   . AMLODIPINE BESYLATE 5 MG PO TABS   Oral   Take 5 mg by mouth every morning.         . ASPIRIN 81 MG PO TABS   Oral   Take 81 mg by mouth every morning.          [name] VITAMIN D3 1000 UNITS PO CAPS   Oral   Take 2 capsules by mouth daily.           [name] DILTIAZEM HCL ER COATED BEADS 240 MG PO CP24   Oral   Take 240 mg by mouth every morning.         [name] FLECAINIDE ACETATE 100 MG PO TABS   Oral   Take 300 mg by mouth daily. Took 3 tablets on Wednesday 10/01/12 and Thursday 10/02/12         .  FUROSEMIDE 20 MG PO TABS   Oral   Take 20 mg by mouth every morning.         [name] LOSARTAN POTASSIUM 100 MG PO TABS   Oral   Take 100 mg by mouth every morning.         [name] LOVASTATIN 20 MG PO TABS   Oral   Take 20 mg by mouth every morning.         [name] METOPROLOL SUCCINATE ER 100 MG PO TB24   Oral   Take 100 mg by mouth every morning. Take with or immediately following a meal.         . ONE-DAILY MULTI VITAMINS PO TABS   Oral   Take 1 tablet by mouth daily.         [name] POTASSIUM CHLORIDE CRYS ER 20 MEQ PO TBCR   Oral   Take 1 tablet (20 mEq total) by mouth daily.   90 tablet   3   . SERTRALINE HCL 50 MG PO TABS   Oral   Take 50 mg by mouth at bedtime.          . TAMSULOSIN HCL 0.4 MG PO CAPS   Oral   Take 0.4 mg by mouth daily after breakfast.         . WARFARIN SODIUM 10 MG PO TABS   Oral   Take 5-10 mg by mouth daily. Take 73m on Mon, wed, fri sat, and take 5 mg on Tuesday Thursday and Sunday.           BP 95/72  Pulse 131  Temp 97.4 F (36.3 C) (Oral)  Resp 11  SpO2 99%  Physical Exam  Nursing note and vitals  reviewed. Constitutional: He is oriented to person, place, and time. He appears well-developed and well-nourished.  HENT:  Head: Normocephalic and atraumatic.  Eyes: EOM are normal.  Neck: Normal range of motion.  Cardiovascular: Regular rhythm, normal heart sounds and intact distal pulses.        Tachycardic  Pulmonary/Chest: Effort normal and breath sounds normal. No respiratory distress.  Abdominal: Soft. He exhibits no distension. There is no tenderness.  Musculoskeletal: Normal range of motion.  Neurological: He is alert and oriented to person, place, and time.  Skin: Skin is warm and dry.  Psychiatric: He has a normal mood and affect. Judgment normal.    ED Course  Procedures (including critical care time)   Date: 10/03/2012  Rate: 128  Rhythm: narrow complex regular tachycardia with questionable P waves, sinus tachycardia vs atrial flutter with 2:1 block.  QRS Axis: normal  Intervals: normal  ST/T Wave abnormalities: normal  Conduction Disutrbances: none  Narrative Interpretation:   Old EKG Reviewed: changed, obvious atrial fibrillation no longer present    Labs Reviewed  BASIC METABOLIC PANEL - Abnormal; Notable for the following:    Glucose, Bld 114 (*)     GFR calc non Af Amer 75 (*)     GFR calc Af Amer 87 (*)     All other components within normal limits  PROTIME-INR - Abnormal; Notable for the following:    Prothrombin Time 21.6 (*)     INR 1.96 (*)     All other components within normal limits  PRO B NATRIURETIC PEPTIDE - Abnormal; Notable for the following:    Pro B Natriuretic peptide (BNP) 1587.0 (*)     All other components within normal limits  CBC  TROPONIN I  LACTIC ACID, PLASMA  MAGNESIUM  D-DIMER, QUANTITATIVE   Dg Chest Portable 1 View  10/03/2012  *RADIOLOGY REPORT*  Clinical Data:  Atrial fibrillation, tachycardia  PORTABLE CHEST - 1 VIEW  Comparison: Portable exam 0851 hours compared to 10/25/2011  Findings: Normal heart size, mediastinal  contours, and pulmonary vascularity. Bronchitic and probable emphysematous changes. Calcified granuloma right base. No pulmonary infiltrate, pleural effusion, or pneumothorax. Bones unremarkable.  IMPRESSION: Bronchitic, old granulomatous and probable emphysematous changes. No acute abnormalities.   Original Report Authenticated By: Lavonia Dana, M.D.    I personally reviewed the imaging tests through PACS system I reviewed available ER/hospitalization records through the EMR   1. Anxiety   2. AF (atrial fibrillation)   3. Atrial flutter       MDM  This appears to be atrial flutter with 2 to one block.  The patient's had no response to Cardizem bolus.  His blood pressure is borderline at this time and therefore our last cardiology to evaluate the patient.  Bolus now.  1030am- spoke with cardiology who will evaluate the pt in the ER  The patient did drop his blood pressure and thus additional IV fluids were given.  I was concerned that the Cardizem may have resulted in some hypotension as the patient was given some IV calcium.  Magnesium added.      Hoy Morn, MD 10/03/12 1539

## 2012-10-03 NOTE — ED Notes (Signed)
Per pt sts went into Afib Wednesday morning and has been taking meds to get his heart into a normal rhythm and its not working. Pt afib on the monitor with rate in the 120s. sts dizzy, light headed and SOB. Denies chest pain,

## 2012-10-03 NOTE — Progress Notes (Addendum)
HR still 120's, BP 93/70. Got his 36m IV dilt & 0.217mdigoxin around 4pm per nursing. Will give additional 0.2565migoxin and watch rate/BP. Jaidynn Balster PA-C

## 2012-10-04 DIAGNOSIS — F411 Generalized anxiety disorder: Secondary | ICD-10-CM

## 2012-10-04 LAB — CBC
HCT: 42.6 % (ref 39.0–52.0)
Hemoglobin: 14.5 g/dL (ref 13.0–17.0)
MCHC: 34 g/dL (ref 30.0–36.0)
MCV: 85 fL (ref 78.0–100.0)
RDW: 13.4 % (ref 11.5–15.5)

## 2012-10-04 LAB — BASIC METABOLIC PANEL
CO2: 21 mEq/L (ref 19–32)
GFR calc non Af Amer: 86 mL/min — ABNORMAL LOW (ref >90)
Glucose, Bld: 91 mg/dL (ref 70–99)
Potassium: 3.8 mEq/L (ref 3.5–5.1)
Sodium: 137 mEq/L (ref 135–145)

## 2012-10-04 LAB — TSH: TSH: 2.984 u[IU]/mL (ref 0.350–4.500)

## 2012-10-04 LAB — TROPONIN I: Troponin I: <0.3 ng/mL (ref <0.30)

## 2012-10-04 MED ORDER — SODIUM CHLORIDE 0.9 % IV BOLUS (SEPSIS)
500.0000 mL | Freq: Once | INTRAVENOUS | Status: AC
  Administered 2012-10-04: 500 mL via INTRAVENOUS

## 2012-10-04 MED ORDER — METOPROLOL TARTRATE 1 MG/ML IV SOLN
5.0000 mg | INTRAVENOUS | Status: DC | PRN
  Administered 2012-10-04: 5 mg via INTRAVENOUS
  Filled 2012-10-04: qty 5

## 2012-10-04 MED ORDER — DIGOXIN 0.25 MG/ML IJ SOLN
0.2500 mg | Freq: Two times a day (BID) | INTRAMUSCULAR | Status: AC
  Administered 2012-10-04 (×2): 0.25 mg via INTRAVENOUS
  Filled 2012-10-04 (×2): qty 1

## 2012-10-04 MED ORDER — SODIUM CHLORIDE 0.9 % IV SOLN
INTRAVENOUS | Status: DC
  Administered 2012-10-04: 75 mL/h via INTRAVENOUS
  Administered 2012-10-05: 23:00:00 via INTRAVENOUS

## 2012-10-04 MED ORDER — DEXTROSE 5 % IV SOLN
10.0000 mg/h | INTRAVENOUS | Status: DC
  Administered 2012-10-04 (×2): 10 mg/h via INTRAVENOUS
  Filled 2012-10-04: qty 100

## 2012-10-04 NOTE — Progress Notes (Signed)
ANTICOAGULATION CONSULT NOTE - Follow Up Consult  Pharmacy Consult for warfarin Indication: PAF  Allergies  Allergen Reactions  . Amiodarone     Abn  thyroid  . Floxin (Ofloxacin)     hallucinations  . Lisinopril Cough  . Tavist Allergy (Clemastine Fumarate)     A fib worse  . Chocolate Palpitations  . Dextromethorphan Palpitations  . Robitussin Cf (Pseudoephedrine-Dm-Gg) Palpitations    Patient Measurements: Height: 6' 1"  (185.4 cm) Weight: 192 lb 3.9 oz (87.2 kg) IBW/kg (Calculated) : 79.9   Vital Signs: Temp: 97.4 F (36.3 C) (01/18 0745) Temp src: Oral (01/18 0745) BP: 94/68 mmHg (01/18 0600) Pulse Rate: 145  (01/18 0700)  Labs:  Basename 10/04/12 0130 10/04/12 0127 10/03/12 2042 10/03/12 0817  HGB 14.5 -- -- 16.3  HCT 42.6 -- -- 47.5  PLT 205 -- -- 207  APTT -- -- -- --  LABPROT 29.0* -- -- 21.6*  INR 2.92* -- -- 1.96*  HEPARINUNFRC -- -- -- --  CREATININE 0.93 -- -- 1.02  CKTOTAL -- -- -- --  CKMB -- -- -- --  TROPONINI -- <0.30 <0.30 <0.30    Estimated Creatinine Clearance: 89.5 ml/min (by C-G formula based on Cr of 0.93).   Medications:  Scheduled:    . aspirin EC  81 mg Oral Daily  . [COMPLETED] calcium gluconate 1 GM IV  1 g Intravenous Once  . Chlorhexidine Gluconate Cloth  6 each Topical Q0600  . cholecalciferol  2,000 Units Oral Daily  . [COMPLETED] digoxin  0.25 mg Intravenous Once  . [COMPLETED] digoxin  0.25 mg Intravenous Once  . [COMPLETED] diltiazem  5 mg Intravenous To ER  . [COMPLETED] enoxaparin (LOVENOX) injection  1 mg/kg Subcutaneous To ER  . enoxaparin (LOVENOX) injection  1 mg/kg Subcutaneous Q12H  . multivitamin with minerals  1 tablet Oral Daily  . mupirocin ointment  1 application Nasal BID  . sertraline  50 mg Oral QHS  . simvastatin  10 mg Oral q1800  . [COMPLETED] sodium chloride  1,000 mL Intravenous Once  . [COMPLETED] sodium chloride  1,000 mL Intravenous Once  . sodium chloride  3 mL Intravenous Q12H  .  [COMPLETED] warfarin  10 mg Oral ONCE-1800  . Warfarin - Pharmacist Dosing Inpatient   Does not apply q1800  . [DISCONTINUED] aspirin  81 mg Oral q morning - 10a  . [DISCONTINUED] diltiazem  5 mg Intravenous Once  . [DISCONTINUED] multivitamin  1 tablet Oral Daily  . [DISCONTINUED] sodium chloride  1,000 mL Intravenous Once  . [DISCONTINUED] Vitamin D3  2 capsule Oral Daily    Assessment: 34 YOM with hx of PAF admitted for rapid aflutter. Was on warfarin PTA at dose of 5m daily except 559mon Tuesdays, Thursdays, and Sundays. INR on admission was 1.96. Started with Enoxaparin bridge to ensure he stays therapeutic for TEE/DCCV on Monday 1/20. INR today is 2.92 after receiving warfarin 1087m1 and Lovenox 96m63m2hr. CBC is stable, no bleeding noted.  Goal of Therapy:  INR 2-3 Anti-Xa level 0.6-1.2 units/ml 4hrs after LMWH dose given Monitor platelets by anticoagulation protocol: Yes   Plan:  1. No warfarin tonight as INR jumped significantly overnight-will watch carefully to ensure patient stays therapeutic before procedure 2. Continue Lovenox 96mg35mq Q12 3. Daily PT/INR 4. Q72hr CBC  Aleeya Veitch D. Leimomi Zervas, PharmD Clinical Pharmacist Pager: [phone] 10:41 AM

## 2012-10-04 NOTE — Progress Notes (Signed)
Patient ID: NAT LOWENTHAL, male   DOB: 06-12-1946, 67 y.o.   MRN: [id number]   Patient Name: Francis Campbell Date of Encounter: 10/04/2012    SUBJECTIVE  Complains continued palpitations. He is still in rapid A. fib at around 150 beats per minute. Blood pressure stable and he is not short of breath. Has not received any diltiazem or digoxin since just afternoon. Per nurse dropped his blood pressure into the 80s with the diltiazem. Denies any angina  CURRENT MEDS    . aspirin EC  81 mg Oral Daily  . Chlorhexidine Gluconate Cloth  6 each Topical Q0600  . cholecalciferol  2,000 Units Oral Daily  . enoxaparin (LOVENOX) injection  1 mg/kg Subcutaneous Q12H  . multivitamin with minerals  1 tablet Oral Daily  . mupirocin ointment  1 application Nasal BID  . sertraline  50 mg Oral QHS  . simvastatin  10 mg Oral q1800  . sodium chloride  3 mL Intravenous Q12H  . Warfarin - Pharmacist Dosing Inpatient   Does not apply q1800    OBJECTIVE  Filed Vitals:   10/04/12 0600 10/04/12 0700 10/04/12 0745 10/04/12 1154  BP: 94/68     Pulse: 140 145    Temp:   97.4 F (36.3 C) 97.9 F (36.6 C)  TempSrc:   Oral Oral  Resp:   18 18  Height:      Weight:      SpO2: 99% 99%      Intake/Output Summary (Last 24 hours) at 10/04/12 1226 Last data filed at 10/04/12 1200  Gross per 24 hour  Intake    560 ml  Output    950 ml  Net   -390 ml   Filed Weights   10/03/12 1515 10/03/12 1900 10/04/12 0404  Weight: 190 lb (86.183 kg) 191 lb 9.3 oz (86.9 kg) 192 lb 3.9 oz (87.2 kg)    PHYSICAL EXAM  General: Pleasant, NAD. Neuro: Alert and oriented X 3. Moves all extremities spontaneously. Psych: Normal affect. HEENT:  Normal  Neck: Supple without bruits or JVD. Lungs:  Resp regular and unlabored, CTA. Heart: RR no s3, s4, or murmurs. Abdomen: Soft, non-tender, non-distended, BS + x 4.  Extremities: No clubbing, cyanosis or edema. DP/PT/Radials 2+ and equal bilaterally.  Accessory Clinical  Findings  CBC  Basename 10/04/12 0130 10/03/12 0817  WBC 10.1 10.2  NEUTROABS -- --  HGB 14.5 16.3  HCT 42.6 47.5  MCV 85.0 84.2  PLT 205 629   Basic Metabolic Panel  Basename 52/84/13 0130 10/03/12 1242 10/03/12 0817  NA 137 -- 139  K 3.8 -- 3.8  CL 106 -- 103  CO2 21 -- 24  GLUCOSE 91 -- 114*  BUN 18 -- 21  CREATININE 0.93 -- 1.02  CALCIUM 8.3* -- 9.1  MG -- 1.9 --  PHOS -- -- --   Liver Function Tests No results found for this basename: AST:2,ALT:2,ALKPHOS:2,BILITOT:2,PROT:2,ALBUMIN:2 in the last 72 hours No results found for this basename: LIPASE:2,AMYLASE:2 in the last 72 hours Cardiac Enzymes  Basename 10/04/12 0832 10/04/12 0127 10/03/12 2042  CKTOTAL -- -- --  CKMB -- -- --  CKMBINDEX -- -- --  TROPONINI <0.30 <0.30 <0.30   BNP No components found with this basename: POCBNP:3 D-Dimer  Basename 10/03/12 1242  DDIMER <0.27   Hemoglobin A1C No results found for this basename: HGBA1C in the last 72 hours Fasting Lipid Panel No results found for this basename: CHOL,HDL,LDLCALC,TRIG,CHOLHDL,LDLDIRECT in the last 72 hours Thyroid  Function Tests  Basename 10/03/12 2042  TSH 2.984  T4TOTAL --  T3FREE --  THYROIDAB --    TELE Atrial fibrillation with a rapid ventricular rate  ECG  Atrial fibrillation with a rapid ventricular rate  Radiology/Studies  Dg Abd 1 View  09/15/2012  *RADIOLOGY REPORT*  Clinical Data: Left flank pain, nephrolithiasis  ABDOMEN - 1 VIEW  Comparison: 09/08/2012, 09/04/2012  Findings: Stable radiopaque calcifications scattered throughout the left renal mid and lower pole regions.  Stable pelvic calcifications.  Postop changes of the lower abdominal Vianna Venezia over the pelvis.  Nonobstructive bowel gas pattern.  No osseous abnormality.  IMPRESSION: Stable left nephrolithiasis radiographically   Original Report Authenticated By: Jerilynn Mages. Shick, M.D.    Dg Chest Portable 1 View  10/03/2012  *RADIOLOGY REPORT*  Clinical Data:  Atrial  fibrillation, tachycardia  PORTABLE CHEST - 1 VIEW  Comparison: Portable exam 8403 hours compared to 10/25/2011  Findings: Normal heart size, mediastinal contours, and pulmonary vascularity. Bronchitic and probable emphysematous changes. Calcified granuloma right base. No pulmonary infiltrate, pleural effusion, or pneumothorax. Bones unremarkable.  IMPRESSION: Bronchitic, old granulomatous and probable emphysematous changes. No acute abnormalities.   Original Report Authenticated By: Lavonia Dana, M.D.     ASSESSMENT AND PLAN   Remains in atrial fibrillation with a rapid ventricular rate. We'll give metoprolol 5 mg IV q. 5 minutes for a total of 3 doses. Has received 25 mg of by mouth metoprolol 1 hour ago. We'll also give 0.25 mg of IV digoxin x2. He is anticoagulated. His arrange for TE cardioversion on Monday if he does not convert. I explained this to he and his wife. He ruled out for myocardial infarction.  Signed, Jenell Milliner MD

## 2012-10-05 LAB — PROTIME-INR
INR: 2.76 — ABNORMAL HIGH (ref 0.00–1.49)
Prothrombin Time: 27.8 seconds — ABNORMAL HIGH (ref 11.6–15.2)

## 2012-10-05 MED ORDER — WARFARIN SODIUM 3 MG PO TABS
3.0000 mg | ORAL_TABLET | Freq: Once | ORAL | Status: AC
  Administered 2012-10-05: 3 mg via ORAL
  Filled 2012-10-05: qty 1

## 2012-10-05 MED ORDER — DILTIAZEM HCL 100 MG IV SOLR
5.0000 mg/h | INTRAVENOUS | Status: DC
  Administered 2012-10-05: 5 mg/h via INTRAVENOUS
  Filled 2012-10-05 (×2): qty 100

## 2012-10-05 MED ORDER — WARFARIN SODIUM 5 MG PO TABS
5.0000 mg | ORAL_TABLET | Freq: Once | ORAL | Status: DC
  Filled 2012-10-05: qty 1

## 2012-10-05 NOTE — Progress Notes (Signed)
ANTICOAGULATION CONSULT NOTE - Follow Up Consult  Pharmacy Consult for warfarin and Enoxaparin  Indication: PAF  Allergies  Allergen Reactions  . Amiodarone     Abn  thyroid  . Floxin (Ofloxacin)     hallucinations  . Lisinopril Cough  . Tavist Allergy (Clemastine Fumarate)     A fib worse  . Chocolate Palpitations  . Dextromethorphan Palpitations  . Robitussin Cf (Pseudoephedrine-Dm-Gg) Palpitations    Patient Measurements: Height: 6' 1"  (185.4 cm) Weight: 194 lb 10.7 oz (88.3 kg) IBW/kg (Calculated) : 79.9   Vital Signs: Temp: 97.5 F (36.4 C) (01/19 0735) Temp src: Oral (01/19 0735) BP: 115/73 mmHg (01/19 0800) Pulse Rate: 63  (01/19 0800)  Labs:  Basename 10/05/12 0500 10/04/12 0832 10/04/12 0130 10/04/12 0127 10/03/12 2042 10/03/12 0817  HGB -- -- 14.5 -- -- 16.3  HCT -- -- 42.6 -- -- 47.5  PLT -- -- 205 -- -- 207  APTT -- -- -- -- -- --  LABPROT 27.8* -- 29.0* -- -- 21.6*  INR 2.76* -- 2.92* -- -- 1.96*  HEPARINUNFRC -- -- -- -- -- --  CREATININE -- -- 0.93 -- -- 1.02  CKTOTAL -- -- -- -- -- --  CKMB -- -- -- -- -- --  TROPONINI -- <0.30 -- <0.30 <0.30 --    Estimated Creatinine Clearance: 89.5 ml/min (by C-G formula based on Cr of 0.93).   Medications:  Scheduled:     . aspirin EC  81 mg Oral Daily  . Chlorhexidine Gluconate Cloth  6 each Topical Q0600  . cholecalciferol  2,000 Units Oral Daily  . [COMPLETED] digoxin  0.25 mg Intravenous BID  . multivitamin with minerals  1 tablet Oral Daily  . mupirocin ointment  1 application Nasal BID  . sertraline  50 mg Oral QHS  . simvastatin  10 mg Oral q1800  . [COMPLETED] sodium chloride  500 mL Intravenous Once  . sodium chloride  3 mL Intravenous Q12H  . warfarin  5 mg Oral ONCE-1800  . Warfarin - Pharmacist Dosing Inpatient   Does not apply q1800  . [DISCONTINUED] enoxaparin (LOVENOX) injection  1 mg/kg Subcutaneous Q12H    Assessment: 64 YOM with hx of PAF admitted for rapid aflutter. Was on  warfarin PTA at dose of 63m daily except 558mon Tuesdays, Thursdays, and Sundays. INR on admission was 1.96. Started with Enoxaparin bridge until INR therapeutic. Planning for TEE/DCCV on Monday 1/20. INR today is therapeutic at 2.76 after receiving warfarin 1057m1 and Lovenox 35m52m2hr yesterday. CBC is stable, no bleeding noted.  Goal of Therapy:  INR 2-3 Anti-Xa level 0.6-1.2 units/ml 4hrs after LMWH dose given Monitor platelets by anticoagulation protocol: Yes   Plan:  1. D/C Lovenox as INR is now therapeutic  2. Continue Warfarin 3mg 35m dose tonight  3. Daily PT/INR  SeyyeCHS Incrm.D. Clinical Pharmacist   Pager: [phone] 9:45 AM

## 2012-10-05 NOTE — Progress Notes (Signed)
Patient ID: ENIO HORNBACK, male   DOB: Jun 30, 1946, 67 y.o.   MRN: [id number]   Patient Name: Francis Campbell Date of Encounter: 10/05/2012    SUBJECTIVE  He converted to sinus rhythm last night after beginning IV diltiazem. Feels much better. CURRENT MEDS    . aspirin EC  81 mg Oral Daily  . Chlorhexidine Gluconate Cloth  6 each Topical Q0600  . cholecalciferol  2,000 Units Oral Daily  . multivitamin with minerals  1 tablet Oral Daily  . mupirocin ointment  1 application Nasal BID  . sertraline  50 mg Oral QHS  . simvastatin  10 mg Oral q1800  . sodium chloride  3 mL Intravenous Q12H  . warfarin  3 mg Oral ONCE-1800  . Warfarin - Pharmacist Dosing Inpatient   Does not apply q1800    OBJECTIVE  Filed Vitals:   10/05/12 0700 10/05/12 0735 10/05/12 0800 10/05/12 0900  BP: 116/70  115/73 126/65  Pulse: 62  63 71  Temp:  97.5 F (36.4 C)    TempSrc:  Oral    Resp:  18    Height:      Weight:      SpO2: 98%  97% 99%    Intake/Output Summary (Last 24 hours) at 10/05/12 1157 Last data filed at 10/05/12 0600  Gross per 24 hour  Intake 2116.42 ml  Output   2650 ml  Net -533.58 ml   Filed Weights   10/03/12 1900 10/04/12 0404 10/05/12 0351  Weight: 191 lb 9.3 oz (86.9 kg) 192 lb 3.9 oz (87.2 kg) 194 lb 10.7 oz (88.3 kg)    PHYSICAL EXAM  General: Pleasant, NAD. Neuro: Alert and oriented X 3. Moves all extremities spontaneously. Psych: Normal affect. HEENT:  Normal  Neck: Supple without bruits or JVD. Lungs:  Resp regular and unlabored, CTA. Heart: RRR no s3, s4, or murmurs. Abdomen: Soft, non-tender, non-distended, BS + x 4.  Extremities: No clubbing, cyanosis or edema. DP/PT/Radials 2+ and equal bilaterally.  Accessory Clinical Findings  CBC  Basename 10/04/12 0130 10/03/12 0817  WBC 10.1 10.2  NEUTROABS -- --  HGB 14.5 16.3  HCT 42.6 47.5  MCV 85.0 84.2  PLT 205 270   Basic Metabolic Panel  Basename 35/00/93 0130 10/03/12 1242 10/03/12 0817  NA 137  -- 139  K 3.8 -- 3.8  CL 106 -- 103  CO2 21 -- 24  GLUCOSE 91 -- 114*  BUN 18 -- 21  CREATININE 0.93 -- 1.02  CALCIUM 8.3* -- 9.1  MG -- 1.9 --  PHOS -- -- --   Liver Function Tests No results found for this basename: AST:2,ALT:2,ALKPHOS:2,BILITOT:2,PROT:2,ALBUMIN:2 in the last 72 hours No results found for this basename: LIPASE:2,AMYLASE:2 in the last 72 hours Cardiac Enzymes  Basename 10/04/12 0832 10/04/12 0127 10/03/12 2042  CKTOTAL -- -- --  CKMB -- -- --  CKMBINDEX -- -- --  TROPONINI <0.30 <0.30 <0.30   BNP No components found with this basename: POCBNP:3 D-Dimer  Basename 10/03/12 1242  DDIMER <0.27   Hemoglobin A1C No results found for this basename: HGBA1C in the last 72 hours Fasting Lipid Panel No results found for this basename: CHOL,HDL,LDLCALC,TRIG,CHOLHDL,LDLDIRECT in the last 72 hours Thyroid Function Tests  Basename 10/03/12 2042  TSH 2.984  T4TOTAL --  T3FREE --  THYROIDAB --    TELE  Normal sinus rhythm ECG    Radiology/Studies  Dg Abd 1 View  09/15/2012  *RADIOLOGY REPORT*  Clinical Data: Left flank pain,  nephrolithiasis  ABDOMEN - 1 VIEW  Comparison: 09/08/2012, 09/04/2012  Findings: Stable radiopaque calcifications scattered throughout the left renal mid and lower pole regions.  Stable pelvic calcifications.  Postop changes of the lower abdominal Merle Cirelli over the pelvis.  Nonobstructive bowel gas pattern.  No osseous abnormality.  IMPRESSION: Stable left nephrolithiasis radiographically   Original Report Authenticated By: Jerilynn Mages. Shick, M.D.    Dg Chest Portable 1 View  10/03/2012  *RADIOLOGY REPORT*  Clinical Data:  Atrial fibrillation, tachycardia  PORTABLE CHEST - 1 VIEW  Comparison: Portable exam 3967 hours compared to 10/25/2011  Findings: Normal heart size, mediastinal contours, and pulmonary vascularity. Bronchitic and probable emphysematous changes. Calcified granuloma right base. No pulmonary infiltrate, pleural effusion, or  pneumothorax. Bones unremarkable.  IMPRESSION: Bronchitic, old granulomatous and probable emphysematous changes. No acute abnormalities.   Original Report Authenticated By: Lavonia Dana, M.D.     ASSESSMENT AND PLAN  Active Problems:  Anxiety   He finally converted to normal sinus rhythm. We'll obtain EKG to determine QT interval. This was normal last office visit. He may be a good candidate for Tikosyn. Will need EP evaluation in the morning. I am not going to change any of his medications today for concern that he might go back into atrial fib. Will need Dr. Sherryl Barters input as well.  Signed, Jenell Milliner MD

## 2012-10-06 ENCOUNTER — Encounter (HOSPITAL_COMMUNITY): Payer: Self-pay | Admitting: Internal Medicine

## 2012-10-06 ENCOUNTER — Inpatient Hospital Stay (HOSPITAL_COMMUNITY): Payer: BC Managed Care – PPO

## 2012-10-06 ENCOUNTER — Encounter (HOSPITAL_COMMUNITY)
Admission: EM | Disposition: A | Discharge status: Home / Self Care | Payer: Self-pay | Source: Home / Self Care | Attending: Cardiology

## 2012-10-06 DIAGNOSIS — I4892 Unspecified atrial flutter: Principal | ICD-10-CM

## 2012-10-06 LAB — PROTIME-INR
INR: 2.09 — ABNORMAL HIGH (ref 0.00–1.49)
Prothrombin Time: 22.6 seconds — ABNORMAL HIGH (ref 11.6–15.2)

## 2012-10-06 SURGERY — ECHOCARDIOGRAM, TRANSESOPHAGEAL
Anesthesia: Moderate Sedation

## 2012-10-06 MED ORDER — GADOBENATE DIMEGLUMINE 529 MG/ML IV SOLN
19.0000 mL | Freq: Once | INTRAVENOUS | Status: AC | PRN
  Administered 2012-10-06: 19 mL via INTRAVENOUS

## 2012-10-06 MED ORDER — WARFARIN SODIUM 10 MG PO TABS
10.0000 mg | ORAL_TABLET | Freq: Once | ORAL | Status: AC
  Administered 2012-10-06: 10 mg via ORAL
  Filled 2012-10-06: qty 1

## 2012-10-06 NOTE — Consult Note (Signed)
ELECTROPHYSIOLOGY CONSULT NOTE    Patient ID: Francis Campbell MRN: [id number], DOB/AGE: [birth year] 67 y.o.  Admit date: 10/03/2012 Date of Consult: 10-06-2012  Primary Physician: [name], MD Primary Cardiologist: Darlin Coco, MD  Reason for Consultation: atrial fibrillation/atrial flutter  HPI:  Francis Campbell is a 67 year old male whom we have been asked to see to evaluate for atrial fib and atrial flutter.  He states he has had atrial fibrillation for many years and was at one point on Amiodarone.  This medication had to be discontinued secondary to thyroid issues.  He was maintained on Toprol after that.  In December of 2012, he had another episode of atrial fib and was given Flecainide as a "pill in the pocket".  He utilized this in the summer of 2013 which terminated his atrial fibrillation.  He had no further episodes until this past Wednesday when he went back into afib.  He went to work and when he returned home, he took Flecainide.  On Thursday, he was still in afib and took more Flecainide Thursday evening.  Friday morning, he did not feel well with dizziness, palpitations and some chest burning and came to [facility] ER for evaluation.  He was found to be in atrial flutter with RVR and was admitted.  He was placed on IV Diltiazem which restored sinus rhythm the evening of 10-04-2012.  He has maintained sinus rhythm since and is still on a Diltiazem gtt.    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  He is chronically anticoagulated with Warfarin.   He is married with 2 grown sons.  He works as an Chief Financial Officer for [employer].  He denies problems with chest pain, shortness of breath, palpitations.  He is independent at home.   ROS is negative except as outlined above nephrolithiasisand dysphagia  Past Medical History  Diagnosis Date  . Paroxysmal atrial  fibrillation     Takes PRN flecainide  . Hypercholesteremia   . Hypertension   . Valvular heart disease     Mild AI, AS by echo 10/2011  . Nephrolithiasis     s/p lithotripsy, followed by urology  . Mini stroke     ~2005 - had visual symptoms, was told by [name] this may have been a TIA but it's unclear  . Crohn disease     Remotely - in the 1990s  . Esophageal reflux     Remotely     Surgical History:  Past Surgical History  Procedure Date  . Arthoscopic rotaor cuff repair   . Tonsillectomy   . Rotator cuff repair 2000  . Knee surgery 1990, 2000  . Hernia repair 2005  . Eye surgery 1950, [place]     Prescriptions prior to admission  Medication Sig Dispense Refill  . ALPRAZolam (XANAX) 0.25 MG tablet Take 1 tablet (0.25 mg total) by mouth daily as needed.  90 tablet  1  . amLODipine (NORVASC) 5 MG tablet Take 5 mg by mouth every morning.      [name] aspirin 81 MG tablet Take 81 mg by mouth every morning.       . Cholecalciferol (VITAMIN D3) 1000 UNITS CAPS Take 2 capsules by mouth daily.        [name] diltiazem (CARDIZEM CD) 240 MG 24 hr capsule Take 240 mg by mouth every morning.      . flecainide (  TAMBOCOR) 100 MG tablet Take 300 mg by mouth daily as needed. Take 3 tablets at onset of atrial fibrillation.      . furosemide (LASIX) 20 MG tablet Take 20 mg by mouth every morning.      [name] losartan (COZAAR) 100 MG tablet Take 100 mg by mouth every morning.      . lovastatin (MEVACOR) 20 MG tablet Take 20 mg by mouth every morning.      . metoprolol succinate (TOPROL-XL) 100 MG 24 hr tablet Take 100 mg by mouth every morning. Take with or immediately following a meal.      . Multiple Vitamin (MULTIVITAMIN) tablet Take 1 tablet by mouth daily.      . potassium chloride SA (K-DUR,KLOR-CON) 20 MEQ tablet Take 1 tablet (20 mEq total) by mouth daily.  90 tablet  3  . sertraline (ZOLOFT) 50 MG tablet Take 50 mg by mouth at bedtime.       . Tamsulosin HCl (FLOMAX) 0.4 MG CAPS Take 0.4  mg by mouth daily after breakfast.      . warfarin (COUMADIN) 10 MG tablet Take 5-10 mg by mouth daily. Take 69m on Mon, wed, fri sat, and take 5 mg on Tuesday Thursday and Sunday.        Inpatient Medications:    . aspirin EC  81 mg Oral Daily  . Chlorhexidine Gluconate Cloth  6 each Topical Q0600  . cholecalciferol  2,000 Units Oral Daily  . multivitamin with minerals  1 tablet Oral Daily  . mupirocin ointment  1 application Nasal BID  . sertraline  50 mg Oral QHS  . simvastatin  10 mg Oral q1800  . sodium chloride  3 mL Intravenous Q12H  . Warfarin - Pharmacist Dosing Inpatient   Does not apply q1800    Allergies:  Allergies  Allergen Reactions  . Amiodarone     Abn  thyroid  . Floxin (Ofloxacin)     hallucinations  . Lisinopril Cough  . Tavist Allergy (Clemastine Fumarate)     A fib worse  . Chocolate Palpitations  . Dextromethorphan Palpitations  . Robitussin Cf (Pseudoephedrine-Dm-Gg) Palpitations    History   Social History  . Marital Status: Married    Spouse Name: N/A    Number of Children: 2  . Years of Education: N/A   Occupational History  . 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 Smoking status: Never Smoker   . Smokeless tobacco: Never Used  . Alcohol Use: No  . Drug Use: No  . Sexually Active: Yes    Birth Control/ Protection: None   Other Topics Concern  . Not on file   Social History Narrative   Non smoker  No ets      Family History  Problem Relation Age of Onset  . Stroke Mother   . Hypertension Mother   . Kidney disease Mother     stones  . Nephrolithiasis Brother   . Arthritis Paternal Grandmother   . Heart disease Paternal Grandmother   . Leukemia Father   . Heart disease Father   . Nephrolithiasis Father    BP 135/74  Pulse 73  Temp 97.7 F (36.5 C) (Oral)  Resp 18  Ht 6' 1"  (1.854 m)  Wt 194 lb 10.7 oz (88.3 kg)  BMI 25.68 kg/m2  SpO2 99%  Alert and oriented in no acute distress HENT-  normal Eyes- EOMI, without scleral icterus Skin- warm and dry; without rashes  LN-neg Neck- supple without thyromegaly, JVP-flat, carotids brisk and full without bruits Back-without CVAT or kyphosis Lungs-clear to auscultation CV-Regular rate and rhythm, nl S1 and S2, 2/6 systolic gallops or rubs, S4-absent Abd-soft with active bowel sounds; no midline pulsation or hepatomegaly Pulses-intact femoral and distal MKS-without gross deformity Neuro- Ax O, CN3-12 intact, grossly normal motor and sensory function Affect engaging   Labs:   Lab Results  Component Value Date   WBC 10.1 10/04/2012   HGB 14.5 10/04/2012   HCT 42.6 10/04/2012   MCV 85.0 10/04/2012   PLT 205 10/04/2012    Lab 10/04/12 0130  NA 137  K 3.8  CL 106  CO2 21  BUN 18  CREATININE 0.93  CALCIUM 8.3*  PROT --  BILITOT --  ALKPHOS --  ALT --  AST --  GLUCOSE 91   Lab Results  Component Value Date   TROPONINI <0.30 10/04/2012   Lab Results  Component Value Date   DDIMER <0.27 10/03/2012     Radiology/Studies: Dg Chest Portable 1 View 10/03/2012  *RADIOLOGY REPORT*  Clinical Data:  Atrial fibrillation, tachycardia  PORTABLE CHEST - 1 VIEW  Comparison: Portable exam 0851 hours compared to 10/25/2011  Findings: Normal heart size, mediastinal contours, and pulmonary vascularity. Bronchitic and probable emphysematous changes. Calcified granuloma right base. No pulmonary infiltrate, pleural effusion, or pneumothorax. Bones unremarkable.  IMPRESSION: Bronchitic, old granulomatous and probable emphysematous changes. No acute abnormalities.   Original Report Authenticated By: Lavonia Dana, M.D.    ECHO: 11-01-2011- EF 60%, no RWMA, mild aortic sclerosis, mild AR, LA 80m  EKG:  10-04-2012- atrial flutter with 2:1 conduction, rate 148  10-05-2012- sinus rhythm with PVC's, rate 70, QTc 4158mc  TELEMETRY: sinus rhythm since yesterday Patient Active Hospital Problem List: AF (atrial fibrillation) (10/22/2011)  Pt with  recurrent AFib and now Aflut prob 2/2 IC antiarrhythmic expossure  wiould  10 discharge on prev regime 2- arrange Eval with JA for consideration of PVI 3) pill in the pocket, but if awakens 2 nd am in afib to not eat and anticipate DCCV that day 4) consider NOAC in the future if anticoagulation to be continued long term 5)  MRI for prior neurological event as would have implications as long term anticoag

## 2012-10-06 NOTE — Progress Notes (Signed)
ANTICOAGULATION CONSULT NOTE - Follow Up Consult  Pharmacy Consult for warfarin Indication: PAF  Allergies  Allergen Reactions  . Amiodarone     Abn  thyroid  . Floxin (Ofloxacin)     hallucinations  . Lisinopril Cough  . Tavist Allergy (Clemastine Fumarate)     A fib worse  . Chocolate Palpitations  . Dextromethorphan Palpitations  . Robitussin Cf (Pseudoephedrine-Dm-Gg) Palpitations    Patient Measurements: Height: 6' 1"  (185.4 cm) Weight: 194 lb 10.7 oz (88.3 kg) IBW/kg (Calculated) : 79.9   Vital Signs: Temp: 97.7 F (36.5 C) (01/20 0715) Temp src: Oral (01/20 0715) BP: 135/74 mmHg (01/20 0800) Pulse Rate: 73  (01/20 0800)  Labs:  Basename 10/06/12 0515 10/05/12 0500 10/04/12 0832 10/04/12 0130 10/04/12 0127 10/03/12 2042  HGB -- -- -- 14.5 -- --  HCT -- -- -- 42.6 -- --  PLT -- -- -- 205 -- --  APTT -- -- -- -- -- --  LABPROT 22.6* 27.8* -- 29.0* -- --  INR 2.09* 2.76* -- 2.92* -- --  HEPARINUNFRC -- -- -- -- -- --  CREATININE -- -- -- 0.93 -- --  CKTOTAL -- -- -- -- -- --  CKMB -- -- -- -- -- --  TROPONINI -- -- <0.30 -- <0.30 <0.30    Estimated Creatinine Clearance: 89.5 ml/min (by C-G formula based on Cr of 0.93).   Medications:  Scheduled:     . aspirin EC  81 mg Oral Daily  . Chlorhexidine Gluconate Cloth  6 each Topical Q0600  . cholecalciferol  2,000 Units Oral Daily  . multivitamin with minerals  1 tablet Oral Daily  . mupirocin ointment  1 application Nasal BID  . sertraline  50 mg Oral QHS  . simvastatin  10 mg Oral q1800  . sodium chloride  3 mL Intravenous Q12H  . warfarin  10 mg Oral ONCE-1800  . [COMPLETED] warfarin  3 mg Oral ONCE-1800  . Warfarin - Pharmacist Dosing Inpatient   Does not apply q1800    Assessment: 51 YOM with hx of PAF admitted for rapid aflutter. Was on warfarin PTA at dose of 50m daily except 570mon Tuesdays, Thursdays, and Sundays. INR on admission was 1.96. INR at goal (INR=2.09 w/ trend down) today and  patient noted in aflutter.  Goal of Therapy:  INR 2-3 Anti-Xa level 0.6-1.2 units/ml 4hrs after LMWH dose given Monitor platelets by anticoagulation protocol: Yes   Plan:  -Coumadin 1049moday -If discharge soon would consider discharging on home dose: 50m37mily except 5mg 37mTuesdays, Thursdays, and Sundays with close INR follow-up  AndreHildred Laserrm D 10/06/2012 10:29 AM

## 2012-10-06 NOTE — Discharge Summary (Signed)
Discharge Summary   Patient ID: Francis Campbell MRN: [id number], DOB/AGE: Jan 18, 1946 67 y.o. Admit date: 10/03/2012 D/C date:     10/06/2012  Primary Cardiologist: Brack[place]. Will be established with [name] EP as outpt  Primary Discharge Diagnoses:  1. Newly recognized atrial flutter - spontaneously converted on his own 2. Paroxysmal atrial fibrillation 3. Hypotension on admission 4. Pituitary prominence, instructed to f/u PCP for further evaluation  Secondary Discharge Diagnoses:  1. HTN 2. HL 3. Valvular heart disease with mild AI/AS by echo 10/2011 4. Nephrolithiasis s/p lithotripsy, followed by urology  5. Possible TIA 2005 (symptoms were possibly  6. Remote crohn's disease 7. Esophageal reflux remotely  Hospital Course: [name] is a 67 y/o M with history of paroxysmal atrial fib treated with flecainide "pill-in-pocket" approach (but not having had to use it in about a year), normal stress testing 2012, HL, mild AI/AS, possible prior TIA 2005 who presented to [facility] 10/03/12 with complaints of weakness. 2 days prior to admission, he awoke in the morning with the sensation that his heart was out of rhythm so he took 345m of flecainide. He was able to go on with his usual activities including work, but had not converted so last night he took another 3032mof flecainide. When he awoke the morning of admission, he didn't feel quite right (weak). When he stood up, he felt like the room was tilting. He began to get ready for work and stepped in the shower but had to sit down due to feeling weak. He got up, dried off, took his AM medicines, then alerted his wife who drove him to the ER. He was found to be in rapid atrial flutter with HR in the 120's. He was initially treated with 1566mV cardizem but developed hypotension with lowest BP of 65/42. He was treated with NS bolus with improvement in his BP but remained tachycardic. He had the usual slight chest pressure that he  typically has with his afib, but it was not worse than usual & was not exacerbated by activity or inspiration. No SOB, nausea, vomiting, diarrhea, weight changes, recent cough, LEE or LE erythema, fever, chills, recent travel, bedrest, personal/family history of blood clotting. CXR demonstrated bronchitic, old granulomatous and probable emphysematous changes without acute abnormalities. Troponin negative, INR 1.96, CBC WNL, BMET unremarkable except glu 114. D-dimer was negative. Lactic acid 1.0, Mg 1.9. He ruled out for MI. All of his antihypertensives were discontinued due to his significant hypotension.  Due to persistently elevated rates, he was loaded with digoxin and IV metoprolol with plan for possible TEE/DCCV on Monday if he had not converted (INR was subtherapeutic at 1.96 on admission and symptoms by time of admit were >48 hours). Lovenox was added to cover him in the meantime. However, he spontaneously converted to NSR on his own on 10/05/12. He was observed overnight and evaluated by EP this morning. Dr. KleCaryl Comeslt he had atrial flutter probably 2/2 IC antiarrhythmic exposure. Dr. KleCaryl Comescommended to arrange evaluation with Dr. AllRayann Hemanr consideration of PVI. The patient is to continue the pill-in-pocket approach but if he awakens the following morning still in atrial fibrillation, he is to remain NPO, call the office, and anticipate DCCV that day. Dr. KleCaryl Comess seen and examined him today and feels he is stable for discharge. By day of discharge, his BP had improved to between 110/58-136/68. He was only on diltiazem at this point, cardiac med-wise. Per discussion with Dr. KatRon Parkero  was on call, we will continue this at discharge and continue to hold other antihypertensives given his hypotension on admission. The patient was instructed to check his blood pressure regularly and notify us if he was seeing increased readings as he will likely require some re-introduction of his prior  antihypertensives.  Lastly, MRI was undertaken to rule out neurologic event that did not show any infarcts. It did show prominence of the anterior pituitary gland with convex superior margin without compression of the optic nerves and optic chiasm. The patient was notified of this finding and per radiology recommendations should have f/u pituitary contrast enhanced MR in 3-6 months recommended (or sooner if clinically indicated) - he was instructed to discuss with his PCP and verbalized understanding. Pharmacy recommended to continue home dose of Coumadin. We will make sure he is on a steady course by checking INR on Wednesday.    Discharge Vitals: Blood pressure 136/68, pulse 77, temperature 98.1 F (36.7 C), temperature source Oral, resp. rate 18, height 6' 1"  (1.854 m), weight 194 lb 10.7 oz (88.3 kg), SpO2 98.00%.  Labs: Lab Results  Component Value Date   WBC 10.1 10/04/2012   HGB 14.5 10/04/2012   HCT 42.6 10/04/2012   MCV 85.0 10/04/2012   PLT 205 10/04/2012    Lab 10/04/12 0130  NA 137  K 3.8  CL 106  CO2 21  BUN 18  CREATININE 0.93  CALCIUM 8.3*  PROT --  BILITOT --  ALKPHOS --  ALT --  AST --  GLUCOSE 91    Basename 10/04/12 0832 10/04/12 0127  CKTOTAL -- --  CKMB -- --  TROPONINI <0.30 <0.30   Lab Results  Component Value Date   DDIMER <0.27 10/03/2012    Diagnostic Studies/Procedures    Mr Jeri Cos YQ Contrast 10/06/2012  *RADIOLOGY REPORT*  Clinical Data: History of atrial fibrillation.  Evaluate for prior neurological event to help determine anticoagulation therapy regimen.  MRI HEAD WITHOUT AND WITH CONTRAST  Technique:  Multiplanar, multiecho pulse sequences of the brain and surrounding structures were obtained according to standard protocol without and with intravenous contrast  Contrast: 36m MULTIHANCE GADOBENATE DIMEGLUMINE 529 MG/ML IV SOLN  Comparison: 09/01/2007 CT.  No comparison MR.  Findings: No acute infarct.  No evidence of remote medium or large  infarct.  Mild small vessel disease type changes.  Prominence of the anterior pituitary gland with convex superior margin without compression of the optic nerves and optic chiasm. Maximal superior inferior dimension of 7.5 mm.  Dynamic contrast enhanced thin section imaging through the pituitary gland was obtained at the present time.  Follow-up pituitary contrast enhanced MR in 3-6 months recommended (or sooner if clinically indicated).  No other intracranial enhancing lesion is noted.  Ectatic patent major intracranial arterial structures.  Major dural sinuses are patent.  Minimal paranasal sinus mucosal thickening.  Tiny Thornwaldt cyst incidentally noted.  IMPRESSION: No acute infarct.  No evidence of remote medium or large infarct.  Mild small vessel disease type changes.  Prominence of the anterior pituitary gland with convex superior margin without compression of the optic nerves and optic chiasm. Follow-up pituitary contrast enhanced MR in 3-6 months recommended (or sooner if clinically indicated).   Original Report Authenticated By: SGenia Del M.D.    Dg Chest Portable 1 View 10/03/2012  *RADIOLOGY REPORT*  Clinical Data:  Atrial fibrillation, tachycardia  PORTABLE CHEST - 1 VIEW  Comparison: Portable exam 0851 hours compared to 10/25/2011  Findings: Normal heart size, mediastinal  contours, and pulmonary vascularity. Bronchitic and probable emphysematous changes. Calcified granuloma right base. No pulmonary infiltrate, pleural effusion, or pneumothorax. Bones unremarkable.  IMPRESSION: Bronchitic, old granulomatous and probable emphysematous changes. No acute abnormalities.   Original Report Authenticated By: Lavonia Dana, M.D.     Discharge Medications   Discharge Medication List as of 10/06/2012  7:21 PM    CONTINUE these medications which have NOT CHANGED   Details  ALPRAZolam (XANAX) 0.25 MG tablet Take 1 tablet (0.25 mg total) by mouth daily as needed., Starting 08/08/2012, Until  Discontinued, Phone In    aspirin 81 MG tablet Take 81 mg by mouth every morning. , Until Discontinued, Historical Med    Cholecalciferol (VITAMIN D3) 1000 UNITS CAPS Take 2 capsules by mouth daily.  , Until Discontinued, Historical Med    diltiazem (CARDIZEM CD) 240 MG 24 hr capsule Take 240 mg by mouth every morning., Until Discontinued, Historical Med    flecainide (TAMBOCOR) 100 MG tablet Take 300 mg by mouth daily as needed. Take 3 tablets at onset of atrial fibrillation., Until Discontinued, Historical Med    lovastatin (MEVACOR) 20 MG tablet Take 20 mg by mouth every morning., Until Discontinued, Historical Med    Multiple Vitamin (MULTIVITAMIN) tablet Take 1 tablet by mouth daily., Until Discontinued, Historical Med    sertraline (ZOLOFT) 50 MG tablet Take 50 mg by mouth at bedtime. , Until Discontinued, Historical Med    Tamsulosin HCl (FLOMAX) 0.4 MG CAPS Take 0.4 mg by mouth daily after breakfast., Until Discontinued, Historical Med    warfarin (COUMADIN) 10 MG tablet Take 5-10 mg by mouth daily. Take 15m on Mon, wed, fri sat, and take 5 mg on Tuesday Thursday and Sunday., Until Discontinued, Historical Med      STOP taking these medications     amLODipine (NORVASC) 5 MG tablet Comments:  Reason for Stopping:       furosemide (LASIX) 20 MG tablet Comments:  Reason for Stopping:       losartan (COZAAR) 100 MG tablet Comments:  Reason for Stopping:       metoprolol succinate (TOPROL-XL) 100 MG 24 hr tablet Comments:  Reason for Stopping:       potassium chloride SA (K-DUR,KLOR-CON) 20 MEQ tablet Comments:  Reason for Stopping:          Disposition   The patient will be discharged in stable condition to home. Discharge Orders    Future Appointments: Provider: Department: Dept Phone: Center:   10/30/2012 12:00 PM JThompson Grayer MD [facility] [phone]   11/28/2012 8:30 AM [name] MD [facility] [phone]     Future Orders Please Complete By Expires   Diet - low sodium heart healthy      Increase activity slowly      Discharge instructions      Comments:   If you feel that your heart is out of rhythm, you may take a dose of Flecainide. If you wake up the following morning and are still out of rhythm, call the office and do not eat as you might need to be cardioverted. Do not hesitate to call the office if you have questions.     Follow-up Information    Follow up with JThompson Grayer MD. (Our office will call you with this appointment)    Contact information:   [facility] [place] [phone]      Follow  up with [facility]. (The Coumadin clinic will call you for your next appointment this week)    Contact information:   [place] [address] [phone]      Follow up with [name], MD. (Your MRI suggested that your pituitary gland may be more prominent than normal. Please follow up with primary care doctor to discuss further evaluation.)    Contact information:   [place] [address] [phone]            Duration of Discharge Encounter: Greater than 30 minutes including physician and PA time.  Signed, Melina Copa PA-C 10/06/2012, 9:32 PM

## 2012-10-07 ENCOUNTER — Encounter: Payer: Self-pay | Admitting: Family Medicine

## 2012-10-08 ENCOUNTER — Encounter: Payer: Self-pay | Admitting: Family Medicine

## 2012-10-08 ENCOUNTER — Other Ambulatory Visit: Payer: Self-pay

## 2012-10-08 ENCOUNTER — Ambulatory Visit (INDEPENDENT_AMBULATORY_CARE_PROVIDER_SITE_OTHER): Payer: BC Managed Care – PPO | Admitting: *Deleted

## 2012-10-08 ENCOUNTER — Other Ambulatory Visit: Payer: Self-pay | Admitting: *Deleted

## 2012-10-08 ENCOUNTER — Telehealth: Payer: Self-pay | Admitting: *Deleted

## 2012-10-08 ENCOUNTER — Ambulatory Visit (INDEPENDENT_AMBULATORY_CARE_PROVIDER_SITE_OTHER): Payer: BC Managed Care – PPO | Admitting: Family Medicine

## 2012-10-08 ENCOUNTER — Telehealth: Payer: Self-pay

## 2012-10-08 VITALS — BP 142/78 | Temp 98.0°F | Wt 192.0 lb

## 2012-10-08 DIAGNOSIS — R9089 Other abnormal findings on diagnostic imaging of central nervous system: Secondary | ICD-10-CM

## 2012-10-08 DIAGNOSIS — I480 Paroxysmal atrial fibrillation: Secondary | ICD-10-CM

## 2012-10-08 DIAGNOSIS — Z7901 Long term (current) use of anticoagulants: Secondary | ICD-10-CM

## 2012-10-08 DIAGNOSIS — I4891 Unspecified atrial fibrillation: Secondary | ICD-10-CM

## 2012-10-08 DIAGNOSIS — I10 Essential (primary) hypertension: Secondary | ICD-10-CM

## 2012-10-08 MED ORDER — FLECAINIDE ACETATE 100 MG PO TABS: 300.0000 mg | ORAL_TABLET | Freq: Every day | ORAL | Status: DC | PRN

## 2012-10-08 NOTE — Progress Notes (Signed)
Subjective:    Patient ID: Francis Campbell, male    DOB: 1946/09/03, 67 y.o.   MRN: [id number]  HPI  Hospital followup. Patient was admitted on 10/03/2012 and discharged on 10/06/2012.  His primary discharge diagnosis was atrial flutter. Past history of atrial fibrillation. Patient was hypotensive on admission. 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  Patient noted 2 days prior to admission sensation of heart being out of rhythm. He took flecainide and went to work. He continued to have symptoms and took another flecainide that night. On the morning of admission he woke up with increased weakness and dizziness. He was taken to the emergency room and noted to be rapid atrial flutter heart rate 120s. Given 15 mg IV Cardizem but developed hypotension with blood pressure 65/42. Improved with normal saline bolus. Chest x-ray no acute abnormalities. BNP level over 1500. D-dimer negative. Ruled out for MI. INR 1.96. Patient initially treated with digoxin and an IV metoprolol. Discharged on diltiazem.  He's done well since discharge. INR this morning 2.3. Several medication changes. He was taken off amlodipine and furosemide and started on diltiazem. Blood pressures been stable by home readings.  MRI scan was obtained to rule out neurologic event. No infarction. He had noted prominence of the anterior pituitary gland without compression of optic nerves or optic chiasm. TSH was normal. Radiologist recommended followup pituitary contrast-enhanced MRI in 3-6 months. Patient denies any specific neurologic symptoms.  Past Medical History  Diagnosis Date  . Paroxysmal atrial fibrillation     Takes PRN flecainide  . Hypercholesteremia   . Hypertension   . Valvular heart disease     Mild AI, ASclerosis without AS by echo 10/2011  . Nephrolithiasis     s/p lithotripsy, followed by urology  . TIA (transient ischemic attack)     ~2005 - had visual symptoms, was told by [name] this may  have been a TIA but it's unclear  . Crohn disease     Remotely - in the 1990s  . Esophageal reflux     Remotely  . Atrial flutter     a. Dx 09/2012.  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   Past Surgical History  Procedure Date  . Arthoscopic rotaor cuff repair   . Tonsillectomy   . Rotator cuff repair 2000  . Knee surgery 1990, 2000  . Hernia repair 2005  . Eye surgery [place]    reports that he has never smoked. He has never used smokeless tobacco. He reports that he does not drink alcohol or use illicit drugs. family history includes Arthritis in his paternal grandmother; Heart disease in his father and paternal grandmother; Hypertension in his mother; Kidney disease in his mother; Leukemia in his father; Nephrolithiasis in his brother and father; and Stroke in his mother. Allergies  Allergen Reactions  . Amiodarone     Abn  thyroid  . Floxin (Ofloxacin)     hallucinations  . Lisinopril Cough  . Tavist Allergy (Clemastine Fumarate)     A fib worse  . Chocolate Palpitations  . Dextromethorphan Palpitations  . Robitussin Cf (Pseudoephedrine-Dm-Gg) Palpitations      Review of Systems  Constitutional: Negative for fatigue.  Eyes: Negative for visual disturbance.  Respiratory: Negative for cough, chest tightness and shortness of breath.   Cardiovascular: Negative for chest pain, palpitations and leg swelling.  Gastrointestinal: Negative for nausea, vomiting and abdominal pain.  Genitourinary: Negative for  dysuria.  Neurological: Negative for dizziness, syncope, weakness, light-headedness and headaches.  Psychiatric/Behavioral: Negative for confusion.       Objective:   Physical Exam  Constitutional: He is oriented to person, place, and time. He appears well-developed and well-nourished.  HENT:  Right Ear: External ear normal.  Left Ear: External ear normal.  Mouth/Throat: Oropharynx is clear and moist.  Eyes: Pupils are equal,  round, and reactive to light.  Neck: Neck supple. No thyromegaly present.  Cardiovascular: Normal rate and regular rhythm.  Exam reveals no gallop.   Pulmonary/Chest: Effort normal and breath sounds normal. No respiratory distress. He has no wheezes. He has no rales.  Musculoskeletal: He exhibits no edema.  Neurological: He is alert and oriented to person, place, and time. No cranial nerve deficit.       Full-strength throughout. Normal cerebellar by finger to nose testing  Psychiatric: He has a normal mood and affect. His behavior is normal.          Assessment & Plan:  #1 recent atrial flutter/fibrillation which converted spontaneously. Patient currently stable on diltiazem and normal sinus rhythm .  Continue coumadin therapy. #2 abnormal MRI scan with prominent anterior pituitary but no distinct mass. No evidence for optic nerve compression. Patient asymptomatic. Followup MRI scan in 3 months to reassess-scheduled. #3 hypertension stable continue close monitoring

## 2012-10-08 NOTE — Patient Instructions (Addendum)
We will set up repeat MRI scan in about 3 months.

## 2012-10-08 NOTE — Telephone Encounter (Signed)
Pt states he is doing great.  Denies any problems.  He states he needs a coumadin appt and he was transferred into the coumadin clinic for the appt.  Pt states he has flecainide but will need some more soon.  I will call [facility] to check on this.  He is aware of the dates and times of his cardiology f/u appts.

## 2012-10-08 NOTE — Telephone Encounter (Signed)
[facility] was called and Flecainide was refilled per discharge summary.

## 2012-10-08 NOTE — Telephone Encounter (Signed)
Pt was notified that flecainide was refilled.

## 2012-10-08 NOTE — Telephone Encounter (Signed)
Transitional Care Management  Date of DIscharge:  10/06/12 Diagnosis; Newly recognized atrial flutter-spontaneously converted on his own Paroxysmal atrial fibrillation Hypotention on admission Pituatary prominence  Patient appears knowledgeable about his condition and treatment. He is compliant with his medication and has ov with cardiology today for his first protime.    Patient has post hospital f/u on 10-08-12 at 2:30 with Dr Elease Hashimoto

## 2012-10-08 NOTE — Progress Notes (Signed)
Recent hospitalization 1/17-1/20 Primary Discharge Diagnoses:  1. Newly recognized atrial flutter  - spontaneously converted on his own  2. Paroxysmal atrial fibrillation  3. Hypotension on admission  4. Pituitary prominence, instructed to f/u PCP for further evaluation  Secondary Discharge Diagnoses:  1. HTN  2. HL  3. Valvular heart disease with mild AI/AS by echo 10/2011  4. Nephrolithiasis s/p lithotripsy, followed by urology  5. Possible TIA 2005 (symptoms were possibly  6. Remote crohn's disease  7. Esophageal reflux remotely

## 2012-10-17 ENCOUNTER — Telehealth: Payer: Self-pay | Admitting: Cardiology

## 2012-10-17 NOTE — Telephone Encounter (Signed)
Patient did want for  [name] to be aware that prior to his admission on January 17 he had been taking Tamsulosin for about a week and took last night and woke up in A fib this am. Advised him to avoid taking Tamsulosin

## 2012-10-17 NOTE — Telephone Encounter (Signed)
Erratic pulse averaging in the 50's, feels like A fib and blood pressure averaging 105/60's. Patient states he woke up like this.  Should he take his 3 Flecainide now, has had regular medications this am. Will forward to  [name] for review

## 2012-10-17 NOTE — Telephone Encounter (Signed)
Yes, go ahead and take his 300 mg flecainide now and  rest in bed.

## 2012-10-17 NOTE — Telephone Encounter (Signed)
Patient phoned and was back in rhythm within 2 hours of Flecainide. Will forward Dr Jeffie Pollock not regarding Flomax and side effects.

## 2012-10-17 NOTE — Telephone Encounter (Signed)
Advised patient

## 2012-10-17 NOTE — Telephone Encounter (Signed)
I agree with stopping the tamsulosin

## 2012-10-17 NOTE — Telephone Encounter (Signed)
Follow-up:     Patient called in wanting to speak with you about his irregular heartbeat.  Please call back.

## 2012-10-17 NOTE — Telephone Encounter (Signed)
New problem:   C/o irregular heart rate . B/p today  100/62. Pulse  54. No chest pain , no sob.

## 2012-10-19 ENCOUNTER — Telehealth: Payer: Self-pay | Admitting: Cardiology

## 2012-10-19 NOTE — Telephone Encounter (Signed)
Patient called to report elevated BP readings at home. He has checked his BP twice daily since hospital discharge October 06, 2012. His readings are consistently 150-160/85. He was taken off of several antihypertensive medications during his hospitalization due to hypotension. However, he was instructed to follow his BPs at home and contact us for instructions regarding restarting these if he has elevated readings as an outpatient. I instructed him to add back amlodipine 5 mg once daily. He will continue to keep a BP log and bring this with him to his follow-up visit 10/30/2012. He expressed verbal understanding and agrees with this plan of care.

## 2012-10-30 ENCOUNTER — Ambulatory Visit: Payer: 59 | Admitting: Internal Medicine

## 2012-11-02 ENCOUNTER — Telehealth: Payer: Self-pay | Admitting: Physician Assistant

## 2012-11-02 NOTE — Telephone Encounter (Signed)
Pt called reporting gradual upward trending HR over the last several weeks, currently 89-102 bpm. BP 140/90. Asymptomatic. Requested resumption of Toprol, which he previously took at 100 mg daily. He was taken off this medication following hospitalization, approximately 1 month ago. I recommended he resume this at 50 mg daily, and f/u with Dr Mare Ferrari in next few weeks, as scheduled. He is to otherwise continue Cardizem 240 daily for rate control, as well. He is also on Coumadin.

## 2012-11-07 DIAGNOSIS — R930 Abnormal findings on diagnostic imaging of skull and head, not elsewhere classified: Secondary | ICD-10-CM | POA: Diagnosis not present

## 2012-11-07 DIAGNOSIS — I10 Essential (primary) hypertension: Secondary | ICD-10-CM

## 2012-11-07 DIAGNOSIS — I4891 Unspecified atrial fibrillation: Secondary | ICD-10-CM

## 2012-11-12 ENCOUNTER — Ambulatory Visit (INDEPENDENT_AMBULATORY_CARE_PROVIDER_SITE_OTHER): Payer: BC Managed Care – PPO | Admitting: Internal Medicine

## 2012-11-12 ENCOUNTER — Encounter: Payer: Self-pay | Admitting: Internal Medicine

## 2012-11-12 ENCOUNTER — Ambulatory Visit (INDEPENDENT_AMBULATORY_CARE_PROVIDER_SITE_OTHER): Payer: BC Managed Care – PPO | Admitting: *Deleted

## 2012-11-12 VITALS — BP 129/75 | HR 58 | Ht 74.0 in | Wt 197.0 lb

## 2012-11-12 DIAGNOSIS — Z7901 Long term (current) use of anticoagulants: Secondary | ICD-10-CM

## 2012-11-12 DIAGNOSIS — I480 Paroxysmal atrial fibrillation: Secondary | ICD-10-CM

## 2012-11-12 DIAGNOSIS — I4891 Unspecified atrial fibrillation: Secondary | ICD-10-CM

## 2012-11-12 DIAGNOSIS — I4892 Unspecified atrial flutter: Secondary | ICD-10-CM

## 2012-11-12 NOTE — Patient Instructions (Addendum)
Your physician wants you to follow-up in: 6 months with Dr Vallery Ridge will receive a reminder letter in the mail two months in advance. If you don't receive a letter, please call our office to schedule the follow-up appointment.

## 2012-11-17 DIAGNOSIS — I4892 Unspecified atrial flutter: Secondary | ICD-10-CM | POA: Insufficient documentation

## 2012-11-17 NOTE — Assessment & Plan Note (Signed)
The patient has symptomatic paroxysmal atrial fibrillation and atrial flutter.  He has failed medical therapy with flecainide and metoprolol. Therapeutic strategies for afib and atrial flutter including medicine and ablation were discussed in detail with the patient today. Risk, benefits, and alternatives to EP study and radiofrequency ablation for afib were also discussed in detail today. These risks include but are not limited to stroke, bleeding, vascular damage, tamponade, perforation, damage to the esophagus, lungs, and other structures, pulmonary vein stenosis, worsening renal function, and death. The patient understands these risk and wishes to continue medical therapy at this time.  He will contemplate ablation and contact my office if he decides to proceed with ablation in the future.

## 2012-11-17 NOTE — Assessment & Plan Note (Signed)
As above.

## 2012-11-17 NOTE — Progress Notes (Signed)
Primary Care Physician: [name], MD Referring Physician:  Dr Kandice Robinsons Francis Campbell is a 67 y.o. male with a h/o atrial fibrillation and atrial flutter who presents for consideration of catheter ablation.  He reports having atrial fibrillation for several years.  He was initially treated with amiodarone.  This medication had to be discontinued secondary to thyroid issues. He was maintained on Toprol after that. In December of 2012, he had another episode of atrial fib and was given Flecainide as a "pill in the pocket". He utilized this in the summer of 2013 which terminated his atrial fibrillation. He had no further episodes until 1/14 when he went back into afib.He reports symptoms of dizziness, palpitations and some chest burning and came to [facility] ER for evaluation after his afib persisted for more than 24 hours. He was found to be in atrial flutter with RVR and was admitted. He was placed on IV Diltiazem which restored sinus rhythm the evening of 10-04-2012. He has maintained sinus rhythm since.  Today, he denies symptoms of further palpitations, chest pain, shortness of breath, orthopnea, PND, lower extremity edema, dizziness, presyncope, syncope, or neurologic sequela. The patient is tolerating medications without difficulties and is otherwise without complaint today.   Past Medical History  Diagnosis Date  . Paroxysmal atrial fibrillation     Takes PRN flecainide  . Hypercholesteremia   . Hypertension   . Valvular heart disease     Mild AI, ASclerosis without AS by echo 10/2011  . Nephrolithiasis     s/p lithotripsy, followed by urology  . TIA (transient ischemic attack)     ~2005 - had visual symptoms, was told by [name] this may have been a TIA but it's unclear  . Crohn disease     Remotely - in the 1990s  . Esophageal reflux     Remotely  . Atrial flutter     a. Dx 09/2012.  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   Past Surgical History    Procedure Laterality Date  . Arthoscopic rotaor cuff repair    . Tonsillectomy    . Rotator cuff repair  2000  . Knee surgery  1990, 2000  . Hernia repair  2005  . Eye surgery  1950, 1960, 1964    Current Outpatient Prescriptions  Medication Sig Dispense Refill  . ALPRAZolam (XANAX) 0.25 MG tablet Take 1 tablet (0.25 mg total) by mouth daily as needed.  90 tablet  1  . amLODipine (NORVASC) 5 MG tablet Take 5 mg by mouth daily.      . Cholecalciferol (VITAMIN D3) 1000 UNITS CAPS Take 2 capsules by mouth daily.        [name] diltiazem (CARDIZEM CD) 240 MG 24 hr capsule Take 240 mg by mouth every morning.      . flecainide (TAMBOCOR) 100 MG tablet Take 3 tablets (300 mg total) by mouth daily as needed. Take 3 tablets at onset of atrial fibrillation.  12 tablet  4  . lovastatin (MEVACOR) 20 MG tablet Take 20 mg by mouth every morning.      . metoprolol (LOPRESSOR) 100 MG tablet Take 100 mg by mouth daily.       . Multiple Vitamin (MULTIVITAMIN) tablet Take 1 tablet by mouth daily.      . sertraline (ZOLOFT) 50 MG tablet Take 50 mg by mouth at bedtime.       [name] warfarin (COUMADIN) 10 MG tablet Take  5-10 mg by mouth daily. Take 34m on Mon, wed, fri sat, and take 5 mg on Tuesday Thursday and Sunday.      . [DISCONTINUED] digoxin (LANOXIN) 0.125 MG tablet Take 1 tablet (125 mcg total) by mouth daily.  90 tablet  3  . [DISCONTINUED] losartan-hydrochlorothiazide (HYZAAR) 100-12.5 MG per tablet       . [DISCONTINUED] pantoprazole (PROTONIX) 40 MG tablet Take 1 tablet (40 mg total) by mouth daily.  30 tablet  1   No current facility-administered medications for this visit.    Allergies  Allergen Reactions  . Amiodarone     Abn  thyroid  . Flomax (Tamsulosin Hcl)     ? Causes A Fib  . Floxin (Ofloxacin)     hallucinations  . Lisinopril Cough  . Tavist Allergy (Clemastine Fumarate)     A fib worse  . Chocolate Palpitations  . Dextromethorphan Palpitations  . Robitussin Cf  (Pseudoephedrine-Dm-Gg) Palpitations    History   Social History  . Marital Status: Married    Spouse Name: N/A    Number of Children: 2  . Years of Education: N/A   Occupational History  . 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 Smoking status: Never Smoker   . Smokeless tobacco: Never Used  . Alcohol Use: No  . Drug Use: No  . Sexually Active: Yes    Birth Control/ Protection: None   Other Topics Concern  . Not on file   Social History Narrative   Non smoker  No ets           Family History  Problem Relation Age of Onset  . Stroke Mother   . Hypertension Mother   . Kidney disease Mother     stones  . Nephrolithiasis Brother   . Arthritis Paternal Grandmother   . Heart disease Paternal Grandmother   . Leukemia Father   . Heart disease Father   . Nephrolithiasis Father     ROS- All systems are reviewed and negative except as per the HPI above  Physical Exam: Filed Vitals:   11/12/12 1452  BP: 129/75  Pulse: 58  Height: 6' 2"  (1.88 m)  Weight: 197 lb (89.359 kg)    GEN- The patient is well appearing, alert and oriented x 3 today.   Head- normocephalic, atraumatic Eyes-  Sclera clear, conjunctiva pink Ears- hearing intact Oropharynx- clear Neck- supple, no JVP Lymph- no cervical lymphadenopathy Lungs- Clear to ausculation bilaterally, normal work of breathing Heart- Regular rate and rhythm, no murmurs, rubs or gallops, PMI not laterally displaced GI- soft, NT, ND, + BS Extremities- no clubbing, cyanosis, or edema MS- no significant deformity or atrophy Skin- no rash or lesion Psych- euthymic mood, full affect Neuro- strength and sensation are intact  EKG today reveals sinus bradycardia 58 bpm, otherwise normal ekg  Assessment and Plan:

## 2012-11-28 ENCOUNTER — Ambulatory Visit (INDEPENDENT_AMBULATORY_CARE_PROVIDER_SITE_OTHER): Payer: BC Managed Care – PPO | Admitting: Cardiology

## 2012-11-28 ENCOUNTER — Other Ambulatory Visit: Payer: Self-pay | Admitting: *Deleted

## 2012-11-28 ENCOUNTER — Encounter: Payer: Self-pay | Admitting: Cardiology

## 2012-11-28 VITALS — BP 130/82 | HR 78 | Ht 74.0 in | Wt 190.0 lb

## 2012-11-28 DIAGNOSIS — I4891 Unspecified atrial fibrillation: Secondary | ICD-10-CM

## 2012-11-28 DIAGNOSIS — E7800 Pure hypercholesterolemia, unspecified: Secondary | ICD-10-CM

## 2012-11-28 DIAGNOSIS — I480 Paroxysmal atrial fibrillation: Secondary | ICD-10-CM

## 2012-11-28 DIAGNOSIS — I119 Hypertensive heart disease without heart failure: Secondary | ICD-10-CM

## 2012-11-28 NOTE — Assessment & Plan Note (Signed)
The patient has hypercholesterolemia and is on generic Mevacor 20 mg daily.  He is not having any myalgias.  We will plan to recheck fasting labs at his next visit

## 2012-11-28 NOTE — Assessment & Plan Note (Signed)
The patient has had no further episodes of atrial fibrillation since mid January 2014.  He attributes the last episode to having taken Flomax and he has avoided Flomax since that time.

## 2012-11-28 NOTE — Patient Instructions (Addendum)
INCREASE YOUR AMLODIPINE TO 10 MG DAILY   Your physician wants you to follow-up in: 4 months with fasting labs (lp/bmet/hfp) and EKG You will receive a reminder letter in the mail two months in advance. If you don't receive a letter, please call our office to schedule the follow-up appointment.

## 2012-11-28 NOTE — Progress Notes (Signed)
Thomasenia Bottoms Muffley Date of Birth:  July 07, 1946 [facility] [address] [place], [place]  [address] [phone]         Fax   [phone]  History of Present Illness: This pleasant 67 year old gentleman is seen for a followup office visit. He has a history of paroxysmal atrial fibrillation and a history of essential hypertension. His previous echocardiograms have shown mild aortic insufficiency. He has had atypical chest pain and has had previously normal nuclear stress tests. Overall since last visit he has been feeling well. He is on long-term Coumadin because of his paroxysmal atrial flutter.  Since we last saw him he had another admission to the hospital with paroxysmal atrial flutter fibrillation 09/30/12- 10/04/12. He subsequently saw [name] on 11/17/12 to discuss consideration of catheter ablation.  At this point he would like to try medical therapy a while longer.  He does have flecainide as a "pill in the pocket" and takes 3 100 mg tablets at the onset of his atrial fibrillation. Current Outpatient Prescriptions  Medication Sig Dispense Refill  . ALPRAZolam (XANAX) 0.25 MG tablet Take 1 tablet (0.25 mg total) by mouth daily as needed.  90 tablet  1  . amLODipine (NORVASC) 10 MG tablet Take 10 mg by mouth daily.      . Cholecalciferol (VITAMIN D3) 1000 UNITS CAPS Take 2 capsules by mouth daily.        [name] diltiazem (CARDIZEM CD) 240 MG 24 hr capsule Take 240 mg by mouth every morning.      . flecainide (TAMBOCOR) 100 MG tablet Take 3 tablets (300 mg total) by mouth daily as needed. Take 3 tablets at onset of atrial fibrillation.  12 tablet  4  . lovastatin (MEVACOR) 20 MG tablet Take 20 mg by mouth every morning.      . metoprolol (LOPRESSOR) 100 MG tablet Take 100 mg by mouth daily.       . Multiple Vitamin (MULTIVITAMIN) tablet Take 1 tablet by mouth daily.      . sertraline (ZOLOFT) 50 MG tablet Take 50 mg by mouth at bedtime.       [name] warfarin (COUMADIN) 10 MG  tablet Take 5-10 mg by mouth daily. Take 71m on Mon, wed, fri sat, and take 5 mg on Tuesday Thursday and Sunday.      . [DISCONTINUED] digoxin (LANOXIN) 0.125 MG tablet Take 1 tablet (125 mcg total) by mouth daily.  90 tablet  3  . [DISCONTINUED] losartan-hydrochlorothiazide (HYZAAR) 100-12.5 MG per tablet       . [DISCONTINUED] pantoprazole (PROTONIX) 40 MG tablet Take 1 tablet (40 mg total) by mouth daily.  30 tablet  1   No current facility-administered medications for this visit.    Allergies  Allergen Reactions  . Amiodarone     Abn  thyroid  . Flomax (Tamsulosin Hcl)     ? Causes A Fib  . Floxin (Ofloxacin)     hallucinations  . Lisinopril Cough  . Tavist Allergy (Clemastine Fumarate)     A fib worse  . Chocolate Palpitations  . Dextromethorphan Palpitations  . Robitussin Cf (Pseudoephedrine-Dm-Gg) Palpitations    Patient Active Problem List  Diagnosis  . Paroxysmal atrial fibrillation  . Hypercholesteremia  . Hypertension  . Aortic insufficiency  . Systolic murmur  . Cough, persistent  . Cough  . Leucocytosis  . Chest pain radiating to arm  . Abdominal pain  . Crohn disease  . Anticoagulated on Coumadin  . AF (atrial  fibrillation)  . Major depression  . Regional enteritis of unspecified site  . Special screening for malignant neoplasms, colon  . Duodenitis  . Benign hypertensive heart disease without heart failure  . Anxiety  . Atrial flutter    History  Smoking status  . Never Smoker   Smokeless tobacco  . Never Used    History  Alcohol Use No    Family History  Problem Relation Age of Onset  . Stroke Mother   . Hypertension Mother   . Kidney disease Mother     stones  . Nephrolithiasis Brother   . Arthritis Paternal Grandmother   . Heart disease Paternal Grandmother   . Leukemia Father   . Heart disease Father   . Nephrolithiasis Father     Review of Systems: Constitutional: no fever chills diaphoresis or fatigue or change in weight.   Head and neck: no hearing loss, no epistaxis, no photophobia or visual disturbance. Respiratory: No cough, shortness of breath or wheezing. Cardiovascular: No chest pain peripheral edema, palpitations. Gastrointestinal: No abdominal distention, no abdominal pain, no change in bowel habits hematochezia or melena. Genitourinary: No dysuria, no frequency, no urgency, no nocturia. Musculoskeletal:No arthralgias, no back pain, no gait disturbance or myalgias. Neurological: No dizziness, no headaches, no numbness, no seizures, no syncope, no weakness, no tremors. Hematologic: No lymphadenopathy, no easy bruising. Psychiatric: No confusion, no hallucinations, no sleep disturbance.    Physical Exam: Filed Vitals:   11/28/12 0834  BP: 130/82  Pulse: 78   the general appearance reveals a well-developed well-nourished gentleman in no distress.The head and neck exam reveals pupils equal and reactive.  Extraocular movements are full.  There is no scleral icterus.  The mouth and pharynx are normal.  The neck is supple.  The carotids reveal no bruits.  The jugular venous pressure is normal.  The  thyroid is not enlarged.  There is no lymphadenopathy.  The chest is clear to percussion and auscultation.  There are no rales or rhonchi.  Expansion of the chest is symmetrical.  The precordium is quiet.  The first heart sound is normal.  The second heart sound is physiologically split.  There is no  gallop rub or click.  There is a soft systolic ejection murmur at the base.  No diastolic murmur. There is no abnormal lift or heave.  The abdomen is soft and nontender.  The bowel sounds are normal.  The liver and spleen are not enlarged.  There are no abdominal masses.  There are no abdominal bruits.  Extremities reveal good pedal pulses.  There is no phlebitis or edema.  There is no cyanosis or clubbing.  Strength is normal and symmetrical in all extremities.  There is no lateralizing weakness.  There are no sensory  deficits.  The skin is warm and dry.  There is no rash.     Assessment / Plan:  The patient will continue same medication except increase amlodipine to 10 mg daily. Recheck in 4 months for followup office visit and EKG and fasting labs.

## 2012-11-28 NOTE — Assessment & Plan Note (Signed)
The patient brought in a record of his home blood pressure readings.  His diastolic has been remaining borderline high.  We will increase his amlodipine to 10 mg daily.  He will let us know if he develops too much ankle edema

## 2012-12-18 ENCOUNTER — Telehealth: Payer: Self-pay | Admitting: Family Medicine

## 2012-12-18 MED ORDER — SERTRALINE HCL 50 MG PO TABS: 50.0000 mg | ORAL_TABLET | Freq: Every day | ORAL | Status: DC

## 2012-12-18 NOTE — Telephone Encounter (Signed)
Patient called stating that he need a refill of her sertraline 50 mg 1poqd at bedtime sent to [facility] on [address]. Please assist.

## 2012-12-30 ENCOUNTER — Ambulatory Visit (INDEPENDENT_AMBULATORY_CARE_PROVIDER_SITE_OTHER): Payer: BC Managed Care – PPO | Admitting: *Deleted

## 2012-12-30 DIAGNOSIS — I4891 Unspecified atrial fibrillation: Secondary | ICD-10-CM

## 2012-12-30 DIAGNOSIS — I480 Paroxysmal atrial fibrillation: Secondary | ICD-10-CM

## 2013-01-05 ENCOUNTER — Ambulatory Visit
Admission: RE | Admit: 2013-01-05 | Discharge: 2013-01-05 | Disposition: A | Discharge status: Home / Self Care | Payer: BC Managed Care – PPO | Source: Ambulatory Visit | Attending: Family Medicine | Admitting: Family Medicine

## 2013-01-05 DIAGNOSIS — R9089 Other abnormal findings on diagnostic imaging of central nervous system: Secondary | ICD-10-CM

## 2013-01-05 MED ORDER — GADOBENATE DIMEGLUMINE 529 MG/ML IV SOLN
9.0000 mL | Freq: Once | INTRAVENOUS | Status: AC | PRN
  Administered 2013-01-05: 9 mL via INTRAVENOUS

## 2013-01-05 NOTE — Progress Notes (Signed)
Quick Note:  Pt informed on personally identified VM ______

## 2013-01-08 ENCOUNTER — Telehealth: Payer: Self-pay | Admitting: *Deleted

## 2013-01-08 NOTE — Telephone Encounter (Signed)
Patient phoned complaining of swelling in legs and feet (notices when he takes his socks off). After he lays down and elevates them swelling does go down. Blood pressure 115/80 , does watch his sodium intake. Will discuss with Tera Helper NP tomorrow when she is back in the office

## 2013-01-09 NOTE — Telephone Encounter (Signed)
New Prob     Pt would like to supply additional information regarding BP and pulse. Please call.

## 2013-01-09 NOTE — Telephone Encounter (Signed)
Last night heart 115 and out of rhythm took a Flecainide and this morning back in rhythm. Patient wanted to let  [name] be aware. Did advise patient of support stockings. Patient did want  [name] to be aware since increasing his Amlodipine to 10 mg no difference in blood pressure. Will forward to  [name] for review

## 2013-01-09 NOTE — Telephone Encounter (Signed)
Will forward to Tera Helper NP for review

## 2013-01-09 NOTE — Telephone Encounter (Signed)
Is he using support stockings?

## 2013-01-12 ENCOUNTER — Ambulatory Visit (INDEPENDENT_AMBULATORY_CARE_PROVIDER_SITE_OTHER): Payer: BC Managed Care – PPO | Admitting: *Deleted

## 2013-01-12 DIAGNOSIS — I4891 Unspecified atrial fibrillation: Secondary | ICD-10-CM

## 2013-01-12 DIAGNOSIS — I480 Paroxysmal atrial fibrillation: Secondary | ICD-10-CM

## 2013-01-13 ENCOUNTER — Encounter: Payer: Self-pay | Admitting: Cardiology

## 2013-01-14 MED ORDER — AMLODIPINE BESYLATE 5 MG PO TABS: 5.0000 mg | ORAL_TABLET | Freq: Every day | ORAL | Status: DC

## 2013-01-14 NOTE — Telephone Encounter (Signed)
Since BP is running normal now and the higher dose of amlodipine made no difference with BP but probably increased his swelling,  Reduce the amlodipine back to 5 mg daily. Let us know if BP goes back up.

## 2013-01-14 NOTE — Telephone Encounter (Signed)
Advised patient

## 2013-02-02 ENCOUNTER — Ambulatory Visit (INDEPENDENT_AMBULATORY_CARE_PROVIDER_SITE_OTHER): Payer: BC Managed Care – PPO | Admitting: *Deleted

## 2013-02-02 DIAGNOSIS — I4891 Unspecified atrial fibrillation: Secondary | ICD-10-CM | POA: Diagnosis not present

## 2013-02-02 DIAGNOSIS — I480 Paroxysmal atrial fibrillation: Secondary | ICD-10-CM

## 2013-02-07 ENCOUNTER — Encounter (HOSPITAL_COMMUNITY): Payer: Self-pay | Admitting: Emergency Medicine

## 2013-02-07 ENCOUNTER — Emergency Department (HOSPITAL_COMMUNITY): Payer: BC Managed Care – PPO

## 2013-02-07 ENCOUNTER — Emergency Department (HOSPITAL_COMMUNITY)
Admission: EM | Admit: 2013-02-07 | Discharge: 2013-02-07 | Disposition: A | Discharge status: Home / Self Care | Payer: BC Managed Care – PPO | Attending: Emergency Medicine | Admitting: Emergency Medicine

## 2013-02-07 DIAGNOSIS — I4891 Unspecified atrial fibrillation: Secondary | ICD-10-CM | POA: Insufficient documentation

## 2013-02-07 DIAGNOSIS — I4892 Unspecified atrial flutter: Secondary | ICD-10-CM | POA: Insufficient documentation

## 2013-02-07 DIAGNOSIS — N201 Calculus of ureter: Secondary | ICD-10-CM | POA: Diagnosis not present

## 2013-02-07 DIAGNOSIS — Z862 Personal history of diseases of the blood and blood-forming organs and certain disorders involving the immune mechanism: Secondary | ICD-10-CM | POA: Insufficient documentation

## 2013-02-07 DIAGNOSIS — Z8673 Personal history of transient ischemic attack (TIA), and cerebral infarction without residual deficits: Secondary | ICD-10-CM | POA: Insufficient documentation

## 2013-02-07 DIAGNOSIS — J45909 Unspecified asthma, uncomplicated: Secondary | ICD-10-CM | POA: Insufficient documentation

## 2013-02-07 DIAGNOSIS — N281 Cyst of kidney, acquired: Secondary | ICD-10-CM | POA: Diagnosis not present

## 2013-02-07 DIAGNOSIS — Z8679 Personal history of other diseases of the circulatory system: Secondary | ICD-10-CM | POA: Insufficient documentation

## 2013-02-07 DIAGNOSIS — E7800 Pure hypercholesterolemia, unspecified: Secondary | ICD-10-CM | POA: Insufficient documentation

## 2013-02-07 DIAGNOSIS — N23 Unspecified renal colic: Secondary | ICD-10-CM

## 2013-02-07 DIAGNOSIS — Z7901 Long term (current) use of anticoagulants: Secondary | ICD-10-CM | POA: Insufficient documentation

## 2013-02-07 DIAGNOSIS — R110 Nausea: Secondary | ICD-10-CM | POA: Insufficient documentation

## 2013-02-07 DIAGNOSIS — Z8639 Personal history of other endocrine, nutritional and metabolic disease: Secondary | ICD-10-CM | POA: Insufficient documentation

## 2013-02-07 DIAGNOSIS — Z8739 Personal history of other diseases of the musculoskeletal system and connective tissue: Secondary | ICD-10-CM | POA: Insufficient documentation

## 2013-02-07 DIAGNOSIS — N1330 Unspecified hydronephrosis: Secondary | ICD-10-CM | POA: Diagnosis not present

## 2013-02-07 DIAGNOSIS — Z79899 Other long term (current) drug therapy: Secondary | ICD-10-CM | POA: Insufficient documentation

## 2013-02-07 DIAGNOSIS — Z8719 Personal history of other diseases of the digestive system: Secondary | ICD-10-CM | POA: Insufficient documentation

## 2013-02-07 DIAGNOSIS — R109 Unspecified abdominal pain: Secondary | ICD-10-CM | POA: Diagnosis not present

## 2013-02-07 DIAGNOSIS — Z87442 Personal history of urinary calculi: Secondary | ICD-10-CM | POA: Insufficient documentation

## 2013-02-07 DIAGNOSIS — I10 Essential (primary) hypertension: Secondary | ICD-10-CM | POA: Insufficient documentation

## 2013-02-07 DIAGNOSIS — N200 Calculus of kidney: Secondary | ICD-10-CM | POA: Diagnosis not present

## 2013-02-07 DIAGNOSIS — Z9889 Other specified postprocedural states: Secondary | ICD-10-CM | POA: Insufficient documentation

## 2013-02-07 LAB — URINALYSIS, ROUTINE W REFLEX MICROSCOPIC
Glucose, UA: NEGATIVE mg/dL
Leukocytes, UA: NEGATIVE
Protein, ur: NEGATIVE mg/dL
Specific Gravity, Urine: 1.015 (ref 1.005–1.030)
Urobilinogen, UA: 0.2 mg/dL (ref 0.0–1.0)

## 2013-02-07 MED ORDER — HYDROMORPHONE HCL PF 1 MG/ML IJ SOLN
1.0000 mg | Freq: Once | INTRAMUSCULAR | Status: AC
  Administered 2013-02-07: 1 mg via INTRAMUSCULAR
  Filled 2013-02-07: qty 1

## 2013-02-07 MED ORDER — HYDROMORPHONE HCL PF 1 MG/ML IJ SOLN
2.0000 mg | Freq: Once | INTRAMUSCULAR | Status: AC
  Administered 2013-02-07: 2 mg via INTRAMUSCULAR
  Filled 2013-02-07: qty 2

## 2013-02-07 NOTE — ED Notes (Signed)
Dr. Zenia Resides at the bedside.

## 2013-02-07 NOTE — ED Provider Notes (Signed)
History     CSN: [id number]  Arrival date & time 02/07/13  1815   First MD Initiated Contact with Patient 02/07/13 1830      Chief Complaint  Patient presents with  . Flank Pain    (Consider location/radiation/quality/duration/timing/severity/associated sxs/prior treatment) Patient is a 67 y.o. male presenting with flank pain. The history is provided by the patient.  Flank Pain   patient here complaining of right-sided flank pain x24 hours has been colicky and similar to his prior kidney stones. Denies any hematuria or dysuria. No fever or chills. Nausea without vomiting. Patient took Vicodin with some relief prior to arrival. Last kidney stone was 5 months ago and required lithotripsy.  Past Medical History  Diagnosis Date  . Paroxysmal atrial fibrillation     Takes PRN flecainide  . Hypercholesteremia   . Hypertension   . Valvular heart disease     Mild AI, ASclerosis without AS by echo 10/2011  . Nephrolithiasis     s/p lithotripsy, followed by urology  . TIA (transient ischemic attack)     ~2005 - had visual symptoms, was told by [name] this may have been a TIA but it's unclear  . Crohn disease     Remotely - in the 1990s  . Esophageal reflux     Remotely  . Atrial flutter     a. Dx 09/2012.  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  [name] Kidney stones     Past Surgical History  Procedure Laterality Date  . Arthoscopic rotaor cuff repair    . Tonsillectomy    . Rotator cuff repair  2000  . Knee surgery  1990, 2000  . Hernia repair  2005  . Eye surgery  1950, 1960, 1964  . Lithotripsy      Family History  Problem Relation Age of Onset  . Stroke Mother   . Hypertension Mother   . Kidney disease Mother     stones  . Nephrolithiasis Brother   . Arthritis Paternal Grandmother   . Heart disease Paternal Grandmother   . Leukemia Father   . Heart disease Father   . Nephrolithiasis Father     History  Substance Use Topics  .  Smoking status: Never Smoker   . Smokeless tobacco: Never Used  . Alcohol Use: No      Review of Systems  Genitourinary: Positive for flank pain.  All other systems reviewed and are negative.    Allergies  Amiodarone; Flomax; Floxin; Lisinopril; Tavist allergy; Chocolate; Dextromethorphan; and Robitussin cf  Home Medications   Current Outpatient Rx  Name  Route  Sig  Dispense  Refill  . ALPRAZolam (XANAX) 0.25 MG tablet   Oral   Take 1 tablet (0.25 mg total) by mouth daily as needed.   90 tablet   1   . amLODipine (NORVASC) 5 MG tablet   Oral   Take 1 tablet (5 mg total) by mouth daily.   90 tablet   3     NEW DOSE   . Cholecalciferol (VITAMIN D3) 1000 UNITS CAPS   Oral   Take 2 capsules by mouth daily.          [name] diltiazem (CARDIZEM CD) 240 MG 24 hr capsule   Oral   Take 240 mg by mouth every morning.         . flecainide (TAMBOCOR) 100 MG tablet   Oral   Take 3 tablets (300 mg total)  by mouth daily as needed. Take 3 tablets at onset of atrial fibrillation.   12 tablet   4   . lovastatin (MEVACOR) 20 MG tablet   Oral   Take 20 mg by mouth every morning.         . metoprolol (LOPRESSOR) 100 MG tablet   Oral   Take 100 mg by mouth daily.          . Multiple Vitamin (MULTIVITAMIN) tablet   Oral   Take 1 tablet by mouth daily.         . sertraline (ZOLOFT) 50 MG tablet   Oral   Take 1 tablet (50 mg total) by mouth at bedtime.   90 tablet   3   . warfarin (COUMADIN) 10 MG tablet   Oral   Take 5-10 mg by mouth daily. Take 68m on Mon, wed, fri sat, and take 5 mg on Tuesday Thursday and Sunday.           BP 140/75  Pulse 61  Temp(Src) 97.5 F (36.4 C) (Oral)  Resp 14  SpO2 97%  Physical Exam  Nursing note and vitals reviewed. Constitutional: He is oriented to person, place, and time. He appears well-developed and well-nourished.  Non-toxic appearance. No distress.  HENT:  Head: Normocephalic and atraumatic.  Eyes: Conjunctivae,  EOM and lids are normal. Pupils are equal, round, and reactive to light.  Neck: Normal range of motion. Neck supple. No tracheal deviation present. No mass present.  Cardiovascular: Normal rate, regular rhythm and normal heart sounds.  Exam reveals no gallop.   No murmur heard. Pulmonary/Chest: Effort normal and breath sounds normal. No stridor. No respiratory distress. He has no decreased breath sounds. He has no wheezes. He has no rhonchi. He has no rales.  Abdominal: Soft. Normal appearance and bowel sounds are normal. He exhibits no distension. There is no tenderness. There is CVA tenderness. There is no rebound.  Musculoskeletal: Normal range of motion. He exhibits no edema and no tenderness.  Neurological: He is alert and oriented to person, place, and time. He has normal strength. No cranial nerve deficit or sensory deficit. GCS eye subscore is 4. GCS verbal subscore is 5. GCS motor subscore is 6.  Skin: Skin is warm and dry. No abrasion and no rash noted.  Psychiatric: He has a normal mood and affect. His speech is normal and behavior is normal.    ED Course  Procedures (including critical care time)  Labs Reviewed  URINALYSIS, ROUTINE W REFLEX MICROSCOPIC   No results found.   No diagnosis found.    MDM  Patient given hydromorphone x2 and feels better. Has scheduled appointment to see his urologist in 3 days as well as pain medication at home        ALeota Jacobsen MD 02/07/13 2038

## 2013-02-07 NOTE — ED Notes (Signed)
Patient denies any nausea.

## 2013-02-07 NOTE — ED Notes (Signed)
Pt returned from CT °

## 2013-02-07 NOTE — ED Notes (Signed)
Patient transported to CT 

## 2013-02-07 NOTE — ED Notes (Signed)
Pt presents to ED today with c/o left flank pain that started yesterday. Pt has history of kidney stones. NAD.

## 2013-02-07 NOTE — ED Notes (Signed)
Report given to [facility], [place]

## 2013-02-11 ENCOUNTER — Other Ambulatory Visit (HOSPITAL_COMMUNITY): Payer: Self-pay | Admitting: *Deleted

## 2013-02-11 ENCOUNTER — Other Ambulatory Visit: Payer: Self-pay | Admitting: Urology

## 2013-02-11 ENCOUNTER — Encounter (HOSPITAL_COMMUNITY): Payer: Self-pay | Admitting: *Deleted

## 2013-02-11 ENCOUNTER — Telehealth: Payer: Self-pay | Admitting: Cardiology

## 2013-02-11 NOTE — Telephone Encounter (Signed)
Pt states had stop taken coumadin 4 days ago. He had this procedure done before and Dr. Milana Kidney okay to stop taken the coumadin for  5 days prior the procedure. This time The procedure is scheduled tomorrow 02/12/13 for Kidney stones. Pt will re-start the  coumadin on Friday 02/13/13.

## 2013-02-11 NOTE — Telephone Encounter (Signed)
Agree with plan 

## 2013-02-11 NOTE — Telephone Encounter (Signed)
New problem    Pt wants documented when he stopped taking his coumadin in preparation for surgery

## 2013-02-12 ENCOUNTER — Encounter (HOSPITAL_COMMUNITY): Payer: Self-pay | Admitting: *Deleted

## 2013-02-12 ENCOUNTER — Encounter (HOSPITAL_COMMUNITY): Payer: Self-pay | Admitting: Anesthesiology

## 2013-02-12 ENCOUNTER — Encounter (HOSPITAL_COMMUNITY)
Admission: RE | Disposition: A | Discharge status: Home / Self Care | Payer: Self-pay | Source: Ambulatory Visit | Attending: Urology

## 2013-02-12 ENCOUNTER — Ambulatory Visit (HOSPITAL_COMMUNITY)
Admission: RE | Admit: 2013-02-12 | Discharge: 2013-02-12 | Disposition: A | Discharge status: Home / Self Care | Payer: BC Managed Care – PPO | Source: Ambulatory Visit | Attending: Urology | Admitting: Urology

## 2013-02-12 ENCOUNTER — Ambulatory Visit (HOSPITAL_COMMUNITY): Payer: BC Managed Care – PPO | Admitting: Anesthesiology

## 2013-02-12 ENCOUNTER — Ambulatory Visit (HOSPITAL_COMMUNITY): Payer: BC Managed Care – PPO

## 2013-02-12 DIAGNOSIS — K5900 Constipation, unspecified: Secondary | ICD-10-CM | POA: Insufficient documentation

## 2013-02-12 DIAGNOSIS — N201 Calculus of ureter: Secondary | ICD-10-CM | POA: Insufficient documentation

## 2013-02-12 DIAGNOSIS — I10 Essential (primary) hypertension: Secondary | ICD-10-CM | POA: Insufficient documentation

## 2013-02-12 DIAGNOSIS — R011 Cardiac murmur, unspecified: Secondary | ICD-10-CM | POA: Insufficient documentation

## 2013-02-12 DIAGNOSIS — Z888 Allergy status to other drugs, medicaments and biological substances status: Secondary | ICD-10-CM | POA: Insufficient documentation

## 2013-02-12 DIAGNOSIS — I4891 Unspecified atrial fibrillation: Secondary | ICD-10-CM | POA: Insufficient documentation

## 2013-02-12 DIAGNOSIS — Z7901 Long term (current) use of anticoagulants: Secondary | ICD-10-CM | POA: Insufficient documentation

## 2013-02-12 DIAGNOSIS — N135 Crossing vessel and stricture of ureter without hydronephrosis: Secondary | ICD-10-CM | POA: Insufficient documentation

## 2013-02-12 DIAGNOSIS — N411 Chronic prostatitis: Secondary | ICD-10-CM | POA: Insufficient documentation

## 2013-02-12 DIAGNOSIS — Z79899 Other long term (current) drug therapy: Secondary | ICD-10-CM | POA: Insufficient documentation

## 2013-02-12 HISTORY — PX: CYSTOSCOPY/RETROGRADE/URETEROSCOPY/STONE EXTRACTION WITH BASKET: SHX5317

## 2013-02-12 HISTORY — DX: Personal history of urinary calculi: Z87.442

## 2013-02-12 HISTORY — DX: Nonrheumatic aortic (valve) insufficiency: I35.1

## 2013-02-12 HISTORY — DX: Unspecified osteoarthritis, unspecified site: M19.90

## 2013-02-12 HISTORY — DX: Unspecified asthma, uncomplicated: J45.909

## 2013-02-12 LAB — BASIC METABOLIC PANEL
BUN: 20 mg/dL (ref 6–23)
Chloride: 102 mEq/L (ref 96–112)
Creatinine, Ser: 0.89 mg/dL (ref 0.50–1.35)
GFR calc Af Amer: >90 mL/min (ref >90)
Glucose, Bld: 102 mg/dL — ABNORMAL HIGH (ref 70–99)
Potassium: 4.6 mEq/L (ref 3.5–5.1)

## 2013-02-12 LAB — SURGICAL PCR SCREEN
MRSA, PCR: POSITIVE — AB
Staphylococcus aureus: POSITIVE — AB

## 2013-02-12 LAB — CBC
HCT: 44.3 % (ref 39.0–52.0)
MCH: 27.4 pg (ref 26.0–34.0)
MCV: 81.9 fL (ref 78.0–100.0)
Platelets: 200 10*3/uL (ref 150–400)
RBC: 5.41 MIL/uL (ref 4.22–5.81)

## 2013-02-12 SURGERY — CYSTOSCOPY, WITH CALCULUS REMOVAL USING BASKET
Anesthesia: General | Laterality: Right | Wound class: Clean Contaminated

## 2013-02-12 MED ORDER — LIDOCAINE HCL 1 % IJ SOLN
INTRAMUSCULAR | Status: DC | PRN
  Administered 2013-02-12: 60 mg via INTRADERMAL

## 2013-02-12 MED ORDER — PHENAZOPYRIDINE HCL 200 MG PO TABS: 200.0000 mg | ORAL_TABLET | Freq: Three times a day (TID) | ORAL | Status: DC | PRN

## 2013-02-12 MED ORDER — SODIUM CHLORIDE 0.9 % IJ SOLN: 3.0000 mL | INTRAMUSCULAR | Status: DC | PRN

## 2013-02-12 MED ORDER — OXYCODONE HCL 5 MG PO TABS: 5.0000 mg | ORAL_TABLET | ORAL | Status: DC | PRN

## 2013-02-12 MED ORDER — SODIUM CHLORIDE 0.9 % IV SOLN: 250.0000 mL | INTRAVENOUS | Status: DC | PRN

## 2013-02-12 MED ORDER — IOHEXOL 300 MG/ML  SOLN
INTRAMUSCULAR | Status: DC | PRN
  Administered 2013-02-12: 3 mL via INTRAVENOUS

## 2013-02-12 MED ORDER — ACETAMINOPHEN 650 MG RE SUPP
650.0000 mg | RECTAL | Status: DC | PRN
  Filled 2013-02-12: qty 1

## 2013-02-12 MED ORDER — IOHEXOL 300 MG/ML  SOLN
INTRAMUSCULAR | Status: AC
  Filled 2013-02-12: qty 2

## 2013-02-12 MED ORDER — ONDANSETRON HCL 4 MG/2ML IJ SOLN
INTRAMUSCULAR | Status: DC | PRN
  Administered 2013-02-12: 4 mg via INTRAVENOUS

## 2013-02-12 MED ORDER — LACTATED RINGERS IV SOLN: INTRAVENOUS | Status: DC

## 2013-02-12 MED ORDER — LIDOCAINE HCL 2 % EX GEL
CUTANEOUS | Status: AC
  Filled 2013-02-12: qty 10

## 2013-02-12 MED ORDER — BELLADONNA ALKALOIDS-OPIUM 16.2-60 MG RE SUPP
RECTAL | Status: AC
  Filled 2013-02-12: qty 1

## 2013-02-12 MED ORDER — CEFAZOLIN SODIUM-DEXTROSE 2-3 GM-% IV SOLR
INTRAVENOUS | Status: AC
  Filled 2013-02-12: qty 50

## 2013-02-12 MED ORDER — FENTANYL CITRATE 0.05 MG/ML IJ SOLN
INTRAMUSCULAR | Status: DC | PRN
  Administered 2013-02-12: 50 ug via INTRAVENOUS

## 2013-02-12 MED ORDER — 0.9 % SODIUM CHLORIDE (POUR BTL) OPTIME
TOPICAL | Status: DC | PRN
  Administered 2013-02-12: 1000 mL

## 2013-02-12 MED ORDER — MIDAZOLAM HCL 5 MG/5ML IJ SOLN
INTRAMUSCULAR | Status: DC | PRN
  Administered 2013-02-12: 2 mg via INTRAVENOUS

## 2013-02-12 MED ORDER — FENTANYL CITRATE 0.05 MG/ML IJ SOLN
INTRAMUSCULAR | Status: AC
  Filled 2013-02-12: qty 2

## 2013-02-12 MED ORDER — MUPIROCIN 2 % EX OINT
TOPICAL_OINTMENT | Freq: Two times a day (BID) | CUTANEOUS | Status: DC
  Administered 2013-02-12: 1 via NASAL
  Filled 2013-02-12: qty 22

## 2013-02-12 MED ORDER — FENTANYL CITRATE 0.05 MG/ML IJ SOLN
25.0000 ug | INTRAMUSCULAR | Status: DC | PRN
  Administered 2013-02-12 (×2): 50 ug via INTRAVENOUS

## 2013-02-12 MED ORDER — CEFAZOLIN SODIUM-DEXTROSE 2-3 GM-% IV SOLR
2.0000 g | INTRAVENOUS | Status: AC
  Administered 2013-02-12: 2 g via INTRAVENOUS

## 2013-02-12 MED ORDER — FENTANYL CITRATE 0.05 MG/ML IJ SOLN: 25.0000 ug | INTRAMUSCULAR | Status: DC | PRN

## 2013-02-12 MED ORDER — ONDANSETRON HCL 4 MG/2ML IJ SOLN: 4.0000 mg | Freq: Four times a day (QID) | INTRAMUSCULAR | Status: DC | PRN

## 2013-02-12 MED ORDER — SODIUM CHLORIDE 0.9 % IR SOLN
Status: DC | PRN
  Administered 2013-02-12: 3000 mL

## 2013-02-12 MED ORDER — PROPOFOL 10 MG/ML IV BOLUS
INTRAVENOUS | Status: DC | PRN
  Administered 2013-02-12: 200 mg via INTRAVENOUS

## 2013-02-12 MED ORDER — SODIUM CHLORIDE 0.9 % IJ SOLN: 3.0000 mL | Freq: Two times a day (BID) | INTRAMUSCULAR | Status: DC

## 2013-02-12 MED ORDER — ACETAMINOPHEN 325 MG PO TABS: 650.0000 mg | ORAL_TABLET | ORAL | Status: DC | PRN

## 2013-02-12 MED ORDER — LACTATED RINGERS IV SOLN
INTRAVENOUS | Status: DC | PRN
  Administered 2013-02-12: 12:00:00 via INTRAVENOUS

## 2013-02-12 MED ORDER — SODIUM CHLORIDE 0.9 % IR SOLN
Status: DC | PRN
  Administered 2013-02-12: 1000 mL

## 2013-02-12 SURGICAL SUPPLY — 18 items
BAG URO CATCHER STRL LF (DRAPE) ×2 IMPLANT
BASKET LASER NITINOL 1.9FR (BASKET) ×2 IMPLANT
BASKET ZERO TIP NITINOL 2.4FR (BASKET) ×1 IMPLANT
BSKT STON RTRVL 120 1.9FR (BASKET) ×1
BSKT STON RTRVL ZERO TP 2.4FR (BASKET) ×1
CATH URET 5FR 28IN OPEN ENDED (CATHETERS) ×2 IMPLANT
CLOTH BEACON ORANGE TIMEOUT ST (SAFETY) ×2 IMPLANT
DRAPE CAMERA CLOSED 9X96 (DRAPES) ×2 IMPLANT
GLOVE SURG SS PI 8.0 STRL IVOR (GLOVE) ×2 IMPLANT
GOWN PREVENTION PLUS XLARGE (GOWN DISPOSABLE) ×2 IMPLANT
GOWN STRL REIN XL XLG (GOWN DISPOSABLE) ×2 IMPLANT
KIT BALLN UROMAX 15FX4 (MISCELLANEOUS) IMPLANT
KIT BALLN UROMAX 26 75X4 (MISCELLANEOUS) ×1
MANIFOLD NEPTUNE II (INSTRUMENTS) ×2 IMPLANT
PACK CYSTO (CUSTOM PROCEDURE TRAY) ×2 IMPLANT
SHEATH URET ACCESS 12FR/35CM (UROLOGICAL SUPPLIES) ×1 IMPLANT
STENT CONTOUR 6FRX26X.038 (STENTS) ×1 IMPLANT
TUBING CONNECTING 10 (TUBING) ×2 IMPLANT

## 2013-02-12 NOTE — Preoperative (Signed)
Beta Blockers   Reason not to administer Beta Blockers:Not Applicable 

## 2013-02-12 NOTE — Anesthesia Preprocedure Evaluation (Addendum)
Anesthesia Evaluation  Patient identified by MRN, date of birth, ID band Patient awake    Reviewed: Allergy & Precautions, H&P , NPO status , Patient's Chart, lab work & pertinent test results, reviewed documented beta blocker date and time   Airway Mallampati: II TM Distance: >3 FB Neck ROM: full    Dental no notable dental hx. (+) Teeth Intact and Dental Advisory Given   Pulmonary neg pulmonary ROS,  breath sounds clear to auscultation  Pulmonary exam normal       Cardiovascular Exercise Tolerance: Good hypertension, Pt. on medications and Pt. on home beta blockers + dysrhythmias Atrial Fibrillation Rhythm:regular Rate:Normal     Neuro/Psych TIAnegative psych ROS   GI/Hepatic negative GI ROS, Neg liver ROS, GERD-  Medicated and Controlled,  Endo/Other  negative endocrine ROS  Renal/GU negative Renal ROS  negative genitourinary   Musculoskeletal   Abdominal   Peds  Hematology negative hematology ROS (+)   Anesthesia Other Findings   Reproductive/Obstetrics negative OB ROS                          Anesthesia Physical Anesthesia Plan  ASA: III  Anesthesia Plan: General   Post-op Pain Management:    Induction: Intravenous  Airway Management Planned: LMA  Additional Equipment:   Intra-op Plan:   Post-operative Plan:   Informed Consent: I have reviewed the patients History and Physical, chart, labs and discussed the procedure including the risks, benefits and alternatives for the proposed anesthesia with the patient or authorized representative who has indicated his/her understanding and acceptance.   Dental Advisory Given  Plan Discussed with: CRNA and Surgeon  Anesthesia Plan Comments:         Anesthesia Quick Evaluation

## 2013-02-12 NOTE — Brief Op Note (Signed)
02/12/2013  1:12 PM  PATIENT:  Francis Campbell  67 y.o. male  PRE-OPERATIVE DIAGNOSIS:  RIGHT URETERAL STONE  POST-OPERATIVE DIAGNOSIS:  RIGHT URETERAL STONE with URETERAL STRICTURE.  PROCEDURE:  Procedure(s): CYSTOSCOPY/RETROGRADE/DILATION OF URETERAL STRICTURE/URETEROSCOPY/STONE EXTRACTION WITH BASKET, WITH STENT PLACEMENT(Right)  SURGEON:  Surgeon(s) and Role:    * [name], MD - Primary  PHYSICIAN ASSISTANT:   ASSISTANTS: none   ANESTHESIA:   general  EBL:     BLOOD ADMINISTERED:none  DRAINS: right 6x26 JJ stent   LOCAL MEDICATIONS USED:  NONE  SPECIMEN:  Source of Specimen:  stone from right ureter  DISPOSITION OF SPECIMEN:  to family  COUNTS:  YES  TOURNIQUET:  * No tourniquets in log *  DICTATION: .Other Dictation: Dictation Number [id number]  PLAN OF CARE: Discharge to home after PACU  PATIENT DISPOSITION:  PACU - hemodynamically stable.   Delay start of Pharmacological VTE agent (>24hrs) due to surgical blood loss or risk of bleeding: not applicable

## 2013-02-12 NOTE — H&P (Signed)
eason For Visit  Right flank pain   History of Present Illness    Francis Campbell is a patient of Dr. Jeffie Pollock with the following GU hx:  Nephrolithiasis:   -  Stone s/p ESWL on 09/15/12.  He had a left ureteral stone and initially passed fragments.  He was given flomax and had arrhythmia that required hospitalization.  He converted and he took Flomax again Thursday and went back into afib but he converted with a flecanide.  He is back on Warfarin.   Interval Hx:  Francis Campbell returns today for assessment of right flank pain.  He started to experience acute onset of right flank pain 4 days ago and went to the ER where CT was done and noted a 61m distal right ureteral stone..[name] He describes pain as constant, non radiating but denies nausea/vomitting/fever/chills/ab pain but is + for constipation.  Denies any urinary voiding complaints.  He is unable to sleep due to pain.  He has been taking vicodin for pain but not much improvement.  He is on coumadin and has stopped taking this for 2 days in anticipation of having a stone retrieval procedure done.  Denies any other aggravating or alleviating factors.   Past Medical History Problems  1. History of  Atrial Fibrillation 427.31 2. 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 History of  Gross Hematuria 599.71 4. History of  Hematuria 599.70 5. History of  Hypertension 401.9 6. History of  Microscopic Hematuria 599.72 7. History of  Murmurs 785.2  Surgical History Problems  1. History of  Inguinal Hernia Repair Bilateral 2. History of  Lithotripsy 3. History of  Rotator Cuff Repair  Current Meds 1. ALPRAZolam 0.25 MG Oral Tablet; Therapy: 05Apr2012 to 2. AmLODIPine Besylate 5 MG Oral Tablet; Therapy: 21Dec2012 to 3. Centrum TABS; Therapy: (Recorded:02Nov2011) to 4. Flecainide Acetate 100 MG Oral Tablet; Therapy: 22Jan2014 to 5. Furosemide 20 MG Oral Tablet; Therapy: 18Sep2013 to 6. Hydrocodone-Acetaminophen 5-325 MG Oral Tablet; TAKE 1 TO 2  TABLETS BY MOUTH EVERY 6  HOURS AS NEEDED FOR PAIN; Therapy: 281OFB5102to (Evaluate:27May2014); Last  Rx:24May2014 7. Hydrocodone-Acetaminophen 5-500 MG Oral Tablet; Therapy: (Recorded:19Dec2013) to 8. Klor-Con M20 20 MEQ Oral Tablet Extended Release; Therapy: 058NID7824to 9. Losartan Potassium 50 MG Oral Tablet; Therapy: (Recorded:19Dec2013) to 10. Lovastatin 20 MG Oral Tablet; Therapy: 12Aug2013 to 11. Matzim LA 240 MG Oral Tablet Extended Release 24 Hour; Therapy: 15Sep2013 to 12. Metoprolol Succinate ER 100 MG Oral Tablet Extended Release 24 Hour; Therapy:   (Recorded:18Mar2013) to 13. Sertraline HCl 50 MG Oral Tablet; Therapy: 123NTI1443to 14. Vitamin D3 1000 UNIT Oral Capsule; Therapy: (Recorded:15Feb2011) to 15. Warfarin Sodium 5 MG Oral Tablet; Therapy: (Recorded:15Feb2011) to  Allergies Medication  1. Floxin TABS  Family History Problems  1. Paternal history of  Nephrolithiasis 2. Fraternal history of  Nephrolithiasis  Social History Problems  1. Marital History - Currently Married 2. Never A Smoker 3. 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 Alcohol Use 5. Caffeine Use 6. Tobacco Use V15.82  Review of Systems Genitourinary system(s) were reviewed and pertinent findings if present are noted.  Genitourinary: no urinary frequency, no feelings of urinary urgency, no dysuria, no nocturia, no hematuria and no suprapubic pain.    Vitals Vital Signs [Data Includes: Last 1 Day]  [id number]:09AM  BMI Calculated: 24.38 BSA Calculated: 2.13 Height: 6 ft 2 in Weight: 190 lb  Blood Pressure: 126 / 72 Temperature: 98 F Heart Rate: 65  Physical Exam Constitutional: Well nourished  and well developed . No acute distress.  Abdomen: The abdomen is soft and nontender. Right CVA tenderness, but no left CVA tenderness.    Results/Data  10 Feb 2013 8:46 AM   UA With REFLEX       COLOR YELLOW       APPEARANCE CLEAR       SPECIFIC GRAVITY 1.025       pH 6.0       GLUCOSE NEG        BILIRUBIN NEG       KETONE NEG       BLOOD SMALL       PROTEIN NEG       UROBILINOGEN 0.2       NITRITE NEG       LEUKOCYTE ESTERASE NEG       SQUAMOUS EPITHELIAL/HPF NONE SEEN       WBC NONE SEEN       CRYSTALS NONE SEEN       CASTS NONE SEEN       RBC 0-2       BACTERIA NONE SEEN      Assessment Assessed  1. Flank Pain Right 2. Nephrolithiasis 592.0   Patient appears comfortable.  Cannot visualize right ureteral stone on KUB but 58m right distal was documented on CT done in ER.   Plan  Constipation (564.00)  1. Colace 100 MG Oral Capsule; TAKE 1 CAPSULE TWICE DAILY AS NEEDED; Therapy:  249IYM4158to (Evaluate:26Jun2014)  Requested for: 230NMM7680 Last Rx:27May2014; Edited Flank Pain Right  2. HYDROmorphone HCl 2 MG Oral Tablet; TAKE 1 TABLET EVERY 3 TO 4 HOURS AS NEEDED  FOR PAIN; Therapy: 288PJS3159to (Evaluate:30May2014); Last RYV:[id number] Stone (592.1)  3. Follow-up PRN Office  Follow-up  Done: [id number]      1) Relayed Dr.Kenni Newton's msg : Medical expulsion therapy: Rapaflo at front desk.  Rapaflo does not have arrythmia side effect.  Strain for stone and hydration.  If pain not improved in 1 week, return for repeat KUB and renal [place]  2) Dilaudid PO and stool softener/ Pt has phenergan at home for nausea prn. Vicodin not effective.  Cannot give NSAIDs in case of ESWL needed.   3) Sent UA for culture  4) Colace Rx for constipation

## 2013-02-12 NOTE — Interval H&P Note (Signed)
History and Physical Interval Note:  02/12/2013 12:21 PM  Francis Campbell  has presented today for surgery, with the diagnosis of URETERAL STONE  The various methods of treatment have been discussed with the patient and family. After consideration of risks, benefits and other options for treatment, the patient has consented to  Procedure(s): CYSTOSCOPY/RETROGRADE/URETEROSCOPY/STONE EXTRACTION WITH BASKET, WITH POSSIBLE STENT PLACEMENT (Right) as a surgical intervention .  The patient's history has been reviewed, patient examined, no change in status, stable for surgery.  I have reviewed the patient's chart and labs.  Questions were answered to the patient's satisfaction.     Al Bracewell J

## 2013-02-12 NOTE — Anesthesia Postprocedure Evaluation (Signed)
  Anesthesia Post-op Note  Patient: Robbie Rideaux Hiller JR  Procedure(s) Performed: Procedure(s) (LRB): CYSTOSCOPY/RETROGRADE/URETEROSCOPY/STONE EXTRACTION WITH BASKET, WITH POSSIBLE STENT PLACEMENT RIGHT URETERAL DILATION (Right)  Patient Location: PACU  Anesthesia Type: General  Level of Consciousness: awake and alert   Airway and Oxygen Therapy: Patient Spontanous Breathing  Post-op Pain: mild  Post-op Assessment: Post-op Vital signs reviewed, Patient's Cardiovascular Status Stable, Respiratory Function Stable, Patent Airway and No signs of Nausea or vomiting  Last Vitals:  Filed Vitals:   02/12/13 1320  BP: 128/72  Pulse: 58  Temp: 36.4 C  Resp: 12    Post-op Vital Signs: stable   Complications: No apparent anesthesia complications

## 2013-02-12 NOTE — Transfer of Care (Signed)
Immediate Anesthesia Transfer of Care Note  Patient: Francis Campbell  Procedure(s) Performed: Procedure(s): CYSTOSCOPY/RETROGRADE/URETEROSCOPY/STONE EXTRACTION WITH BASKET, WITH POSSIBLE STENT PLACEMENT RIGHT URETERAL DILATION (Right)  Patient Location: PACU  Anesthesia Type:General  Level of Consciousness: awake, alert  and oriented  Airway & Oxygen Therapy: Patient Spontanous Breathing and Patient connected to face mask oxygen  Post-op Assessment: Report given to PACU RN and Post -op Vital signs reviewed and stable  Post vital signs: Reviewed and stable  Complications: No apparent anesthesia complications

## 2013-02-13 ENCOUNTER — Encounter (HOSPITAL_COMMUNITY): Payer: Self-pay | Admitting: Urology

## 2013-02-13 NOTE — Op Note (Signed)
NAME:  Francis Campbell, Francis Campbell             ACCOUNT NO.:  [id number]  MEDICAL RECORD NO.:  [id number]  LOCATION:  [facility]                         FACILITY:  [facility]  PHYSICIAN:  Marshall Cork. Jeffie Pollock, M.D.    DATE OF BIRTH:  1946-07-10  DATE OF PROCEDURE:  02/12/2013 DATE OF DISCHARGE:  02/12/2013                              OPERATIVE REPORT   PROCEDURE:  Cystoscopy with right retrograde pyelogram, dilation of right ureteral stricture, right ureteroscopic stone extraction, insertion of right double-J stent.  PREOPERATIVE DIAGNOSIS:  Right distal ureteral stone.  POSTOPERATIVE DIAGNOSES:  Right distal ureteral stone with right ureteral stricture.  SURGEON:  Marshall Cork. Jeffie Pollock, M.D.  ANESTHESIA:  General.  SPECIMEN:  Stone.  DRAINS:  Six-French x 26-cm double-J stent.  BLOOD LOSS:  Minimal.  COMPLICATIONS:  None.  INDICATIONS:  Francis Campbell is a 67 year old white male with a history of stones who has approximately 6 mm right mid-to-distal ureteral stone with persistent symptoms.  It was not readily visualized on a KUB, so it was felt that ureteroscopy was indicated.  He has a history of AFib and is on warfarin, but has been off that for several days and his INR was 1.3.  FINDINGS AND PROCEDURE:  He was given Ancef and taken to the operating room where general anesthetic was induced.  He was placed in lithotomy position.  He was fitted with PAS hose.  His perineum and genitalia were prepped with Betadine solution.  He was draped in usual sterile fashion.  Cystoscopy was performed using a 22-French scope and 12 and 70 degree lenses.  Examination revealed a normal urethra with the exception of a very mild stricture in the bulb.  The prostatic urethra was approximately 2 cm in length with bilobar hyperplasia with minimal obstruction.  Examination of bladder revealed mild trabeculation.  No tumors were noted.  There was some fine tannish gravel in the bladder. The ureteral orifices were  unremarkable.  The right ureteral orifice was cannulated with 5-French open-end catheter and contrast was instilled.  This revealed some narrowing approximately 3 cm proximal to the meatus with a filling defect just above that consistent with a stone.  There was mild dilation proximally, but no other filling defects.  Once the retrograde pyelogram was performed, a Sensor guidewire was passed to the kidney under fluoroscopic guidance.  The bladder was then drained and the cystoscope was removed.  A 35 cm, 12-French access sheath inner core was then used to attempt to dilate the ureter, however would not pass above the narrowed area noted on retrograde.  At this point, the 6-French short ureteroscope was inserted alongside the wire.  Inspection revealed a tight stricture in the area noted on retrograde.  A 4 cm, 15-French balloon dilation catheter was then passed over the wire across the stricture and the balloon was inflated to 20 atmospheres with dilute contrast.  The process was observed fluoroscopically and the __________ disappeared at 20 atmospheres.  The ureteroscope was then reinserted.  The strictured had been disrupted and the stone was visualized.  There are actually 2 small stones approximately 4 mm in size.  These were grasped together with a  ZeroTip Nitinol basket and removed  intact.  At this point, repeat inspection with ureteroscopy to the iliacs revealed no additional stones.  The ureteroscope was removed.  The cystoscope was reinserted over the wire and a 6-French, 26 cm double-J stent with string was inserted over the wire to the kidney.  The wire was removed leaving a good coil in the kidney, good coil in the bladder. The bladder was drained.  The cystoscope was removed leaving the stent string exiting urethra.  This was secured to the patient's penis.  His drapes were removed.  He was taken down from lithotomy position.  His anesthetic was reversed.  He was  moved to recovery room in stable condition.  There were no complications.  The stones were given to the patient's wife and will be brought to the office for analysis.     Marshall Cork. Jeffie Pollock, M.D.     JJW/MEDQ  D:  02/12/2013  T:  02/13/2013  Job:  [id number]

## 2013-02-24 ENCOUNTER — Ambulatory Visit (INDEPENDENT_AMBULATORY_CARE_PROVIDER_SITE_OTHER): Payer: BC Managed Care – PPO | Admitting: *Deleted

## 2013-02-24 DIAGNOSIS — I4891 Unspecified atrial fibrillation: Secondary | ICD-10-CM

## 2013-02-24 DIAGNOSIS — I480 Paroxysmal atrial fibrillation: Secondary | ICD-10-CM

## 2013-02-28 ENCOUNTER — Other Ambulatory Visit: Payer: Self-pay | Admitting: Cardiology

## 2013-03-03 ENCOUNTER — Other Ambulatory Visit: Payer: Self-pay | Admitting: Cardiology

## 2013-03-03 ENCOUNTER — Other Ambulatory Visit: Payer: Self-pay | Admitting: *Deleted

## 2013-03-03 NOTE — Telephone Encounter (Signed)
Called in refill of Xanax per dr Mare Ferrari

## 2013-03-09 ENCOUNTER — Ambulatory Visit (INDEPENDENT_AMBULATORY_CARE_PROVIDER_SITE_OTHER): Payer: BC Managed Care – PPO | Admitting: Family Medicine

## 2013-03-09 ENCOUNTER — Encounter: Payer: Self-pay | Admitting: Family Medicine

## 2013-03-09 VITALS — BP 122/74 | HR 63 | Temp 97.8°F | Resp 16 | Wt 192.0 lb

## 2013-03-09 DIAGNOSIS — E7800 Pure hypercholesterolemia, unspecified: Secondary | ICD-10-CM | POA: Diagnosis not present

## 2013-03-09 DIAGNOSIS — I119 Hypertensive heart disease without heart failure: Secondary | ICD-10-CM | POA: Diagnosis not present

## 2013-03-09 DIAGNOSIS — F329 Major depressive disorder, single episode, unspecified: Secondary | ICD-10-CM | POA: Diagnosis not present

## 2013-03-09 DIAGNOSIS — I4891 Unspecified atrial fibrillation: Secondary | ICD-10-CM | POA: Diagnosis not present

## 2013-03-09 NOTE — Progress Notes (Signed)
Subjective:    Patient ID: JAC ROMULUS, male    DOB: Aug 07, 1946, 67 y.o.   MRN: [id number]  HPI  Medical followup Since last visit, patient's had a couple of kidney stones. He is followed closely by urologist Has history of intermittent atrial fibrillation. No recent palpitations or dizziness. He is on Coumadin with no recent bleeding complications.  Refer to last dictation. He was admitted back in January with some nonspecific dizziness. 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 He had followup MRI back in April which did not show any changes and suggested 6-12 month followup. He's had previous normal TSH.  All medications reviewed. Compliant with all. Lipids have been stable on lovastatin. Depression stable on sertraline.  Past Medical History  Diagnosis Date  . Hypercholesteremia   . Hypertension   . Valvular heart disease     Mild AI, ASclerosis without AS by echo 10/2011  . TIA (transient ischemic attack)     ~2005 - had visual symptoms, was told by [name] this may have been a TIA but it's unclear  . Crohn disease     Remotely - in the 1990s  . Esophageal reflux     Remotely  . 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  [name] History of kidney stones   . Aortic regurgitation   . Asthma     only as child  . Arthritis   . Paroxysmal atrial fibrillation     Takes PRN flecainide  . Atrial flutter     a. Dx 09/2012.   Past Surgical History  Procedure Laterality Date  . Arthoscopic rotaor cuff repair    . Tonsillectomy    . Rotator cuff repair  2000  . Knee surgery  1990, 2000  . Hernia repair  2005  . Eye surgery  1950, 1960, 1964  . Lithotripsy    . Cystoscopy/retrograde/ureteroscopy/stone extraction with basket Right 02/12/2013    Procedure: CYSTOSCOPY/RETROGRADE/URETEROSCOPY/STONE EXTRACTION WITH BASKET, WITH  STENT PLACEMENT RIGHT URETERAL DILATION;  Surgeon: [name], MD;  Location: [facility];  Service: Urology;  Laterality: Right;    reports that he has never smoked. He has never used smokeless tobacco. He reports that he does not drink alcohol or use illicit drugs. family history includes Arthritis in his paternal grandmother; Heart disease in his father and paternal grandmother; Hypertension in his mother; Kidney disease in his mother; Leukemia in his father; Nephrolithiasis in his brother and father; and Stroke in his mother. Allergies  Allergen Reactions  . Amiodarone     Abn  thyroid  . Flomax (Tamsulosin Hcl)     ? Causes A Fib  . Floxin (Ofloxacin)     hallucinations  . Lisinopril Cough  . Tavist Allergy (Clemastine Fumarate)     A fib worse  . Chocolate Palpitations  . Dextromethorphan Palpitations  . Robitussin Cf (Pseudoephedrine-Dm-Gg) Palpitations     Review of Systems  Constitutional: Negative for fatigue and unexpected weight change.  Eyes: Negative for visual disturbance.  Respiratory: Negative for cough, chest tightness and shortness of breath.   Cardiovascular: Negative for chest pain, palpitations and leg swelling.  Gastrointestinal: Negative for blood in stool.  Genitourinary: Negative for hematuria.  Neurological: Negative for dizziness, syncope, weakness, light-headedness and headaches.       Objective:   Physical Exam  Constitutional: He is oriented to person, place, and time. He appears well-developed and well-nourished.  Neck:  Neck supple. No thyromegaly present.  Cardiovascular: Normal rate and regular rhythm.   Pulmonary/Chest: Effort normal and breath sounds normal. No respiratory distress. He has no wheezes. He has no rales.  Musculoskeletal: He exhibits no edema.  Neurological: He is alert and oriented to person, place, and time. No cranial nerve deficit.          Assessment & Plan:  #1 history of atrial fibrillation. Currently sinus rhythm. Patient remains on Coumadin followed by cardiology clinic #2 history of abnormal  MRI of brain. Routine followup 6 months. Repeat MRI then and consider repeat hormonal assessment #3 history of depression currently stable sertraline #4 hypertension at goal

## 2013-03-26 ENCOUNTER — Ambulatory Visit (INDEPENDENT_AMBULATORY_CARE_PROVIDER_SITE_OTHER): Payer: BC Managed Care – PPO | Admitting: *Deleted

## 2013-03-26 DIAGNOSIS — I4891 Unspecified atrial fibrillation: Secondary | ICD-10-CM | POA: Diagnosis not present

## 2013-03-26 DIAGNOSIS — I480 Paroxysmal atrial fibrillation: Secondary | ICD-10-CM

## 2013-03-26 LAB — POCT INR: INR: 2.8

## 2013-04-07 ENCOUNTER — Ambulatory Visit (INDEPENDENT_AMBULATORY_CARE_PROVIDER_SITE_OTHER): Payer: BC Managed Care – PPO | Admitting: Cardiology

## 2013-04-07 ENCOUNTER — Encounter: Payer: Self-pay | Admitting: Cardiology

## 2013-04-07 VITALS — BP 140/78 | HR 61 | Ht 74.0 in | Wt 190.0 lb

## 2013-04-07 DIAGNOSIS — I10 Essential (primary) hypertension: Secondary | ICD-10-CM

## 2013-04-07 DIAGNOSIS — I4891 Unspecified atrial fibrillation: Secondary | ICD-10-CM | POA: Diagnosis not present

## 2013-04-07 DIAGNOSIS — I480 Paroxysmal atrial fibrillation: Secondary | ICD-10-CM

## 2013-04-07 DIAGNOSIS — E7800 Pure hypercholesterolemia, unspecified: Secondary | ICD-10-CM

## 2013-04-07 NOTE — Assessment & Plan Note (Signed)
Patient is on lovastatin for his hypercholesterolemia.  He is not fasting today.  We will plan to recheck fasting lab work at his next visit.  He is not having any side effects from the statin therapy.

## 2013-04-07 NOTE — Assessment & Plan Note (Signed)
Blood pressure was remaining stable on current medication.  No dizziness or syncope.  Occasional mild left chest tightness at rest but not with exertion.

## 2013-04-07 NOTE — Patient Instructions (Addendum)
Your physician recommends that you continue on your current medications as directed. Please refer to the Current Medication list given to you today.  Your physician wants you to follow-up in: 4 months with fasting labs (lp/bmet/hfp) and EKG You will receive a reminder letter in the mail two months in advance. If you don't receive a letter, please call our office to schedule the follow-up appointment.

## 2013-04-07 NOTE — Progress Notes (Signed)
Mena Pauls Date of Birth:  May 01, 1946 [facility] [address] [place], [place]  [address] [phone]         Fax   [phone]  History of Present Illness: This pleasant 67 year old gentleman is seen for a followup office visit. He has a history of paroxysmal atrial fibrillation and a history of essential hypertension. His previous echocardiograms have shown mild aortic insufficiency. He has had atypical chest pain and has had previously normal nuclear stress tests. He had a stress Myoview on 09/27/11 which showed no ischemia and his ejection fraction was 62%. Overall since last visit he has been feeling well. He is on long-term Coumadin because of his paroxysmal atrial flutter. Since we last saw him he had another admission to the hospital with paroxysmal atrial flutter fibrillation 09/30/12- 10/04/12.  He subsequently saw [name] on 11/17/12 to discuss consideration of catheter ablation. At this point he would like to try medical therapy a while longer. He does have flecainide as a "pill in the pocket" and takes 3 100 mg tablets at the onset of his atrial fibrillation.   Current Outpatient Prescriptions  Medication Sig Dispense Refill  . ALPRAZolam (XANAX) 0.25 MG tablet TAKE 1 TABLET DAILY AS NEEDED  90 tablet  1  . Cholecalciferol (VITAMIN D3) 1000 UNITS CAPS Take 2 capsules by mouth daily.       [name] diltiazem (CARDIZEM CD) 240 MG 24 hr capsule Take 240 mg by mouth every morning.      . lovastatin (MEVACOR) 20 MG tablet Take 20 mg by mouth every morning.      . metoprolol succinate (TOPROL-XL) 100 MG 24 hr tablet Take 100 mg by mouth daily. Take with or immediately following a meal.      . Multiple Vitamin (MULTIVITAMIN) tablet Take 1 tablet by mouth daily.      . sertraline (ZOLOFT) 50 MG tablet Take 1 tablet (50 mg total) by mouth at bedtime.  90 tablet  3  . warfarin (COUMADIN) 10 MG tablet Take 5-10 mg by mouth daily. Take 26m on Mon, wed, fri.   and take  5 mg on Tuesday Thursday, Sat and Sunday.      . [DISCONTINUED] digoxin (LANOXIN) 0.125 MG tablet Take 1 tablet (125 mcg total) by mouth daily.  90 tablet  3  . [DISCONTINUED] losartan-hydrochlorothiazide (HYZAAR) 100-12.5 MG per tablet       . [DISCONTINUED] pantoprazole (PROTONIX) 40 MG tablet Take 1 tablet (40 mg total) by mouth daily.  30 tablet  1   No current facility-administered medications for this visit.    Allergies  Allergen Reactions  . Amiodarone     Abn  thyroid  . Flomax (Tamsulosin Hcl)     ? Causes A Fib  . Floxin (Ofloxacin)     hallucinations  . Lisinopril Cough  . Tavist Allergy (Clemastine Fumarate)     A fib worse  . Chocolate Palpitations  . Dextromethorphan Palpitations  . Robitussin Cf (Pseudoephedrine-Dm-Gg) Palpitations    Patient Active Problem List   Diagnosis Date Noted  . Paroxysmal atrial fibrillation     Priority: High  . Hypercholesteremia     Priority: Medium  . Hypertension     Priority: Medium  . Atrial flutter 11/17/2012  . Anxiety 10/03/2012  . Benign hypertensive heart disease without heart failure 11/28/2011  . Regional enteritis of unspecified site 11/07/2011  . Special screening for malignant neoplasms, colon 11/07/2011  . Duodenitis 11/07/2011  . Major  depression 11/06/2011  . Abdominal pain 10/22/2011  . Crohn disease 10/22/2011  . Anticoagulated on Coumadin 10/22/2011  . AF (atrial fibrillation) 10/22/2011  . Chest pain radiating to arm 09/14/2011  . Leucocytosis 09/09/2011  . Cough, persistent 07/30/2011  . Cough 07/30/2011  . Aortic insufficiency   . Systolic murmur     History  Smoking status  . Never Smoker   Smokeless tobacco  . Never Used    History  Alcohol Use No    Family History  Problem Relation Age of Onset  . Stroke Mother   . Hypertension Mother   . Kidney disease Mother     stones  . Nephrolithiasis Brother   . Arthritis Paternal Grandmother   . Heart disease Paternal Grandmother   .  Leukemia Father   . Heart disease Father   . Nephrolithiasis Father     Review of Systems: Constitutional: no fever chills diaphoresis or fatigue or change in weight.  Head and neck: no hearing loss, no epistaxis, no photophobia or visual disturbance. Respiratory: No cough, shortness of breath or wheezing. Cardiovascular: No chest pain peripheral edema, palpitations. Gastrointestinal: No abdominal distention, no abdominal pain, no change in bowel habits hematochezia or melena. Genitourinary: No dysuria, no frequency, no urgency, no nocturia. Musculoskeletal:No arthralgias, no back pain, no gait disturbance or myalgias. Neurological: No dizziness, no headaches, no numbness, no seizures, no syncope, no weakness, no tremors. Hematologic: No lymphadenopathy, no easy bruising. Psychiatric: No confusion, no hallucinations, no sleep disturbance.    Physical Exam: Filed Vitals:   04/07/13 0944  BP: 140/78  Pulse: 61   the general appearance reveals a well-developed well-nourished gentleman in no distress.The head and neck exam reveals pupils equal and reactive.  Extraocular movements are full.  There is no scleral icterus.  The mouth and pharynx are normal.  The neck is supple.  The carotids reveal no bruits.  The jugular venous pressure is normal.  The  thyroid is not enlarged.  There is no lymphadenopathy.  The chest is clear to percussion and auscultation.  There are no rales or rhonchi.  Expansion of the chest is symmetrical.  The precordium is quiet.  The first heart sound is normal.  The second heart sound is physiologically split.  There is no murmur gallop rub or click.  There is no abnormal lift or heave.  The abdomen is soft and nontender.  The bowel sounds are normal.  The liver and spleen are not enlarged.  There are no abdominal masses.  There are no abdominal bruits.  Extremities reveal good pedal pulses.  There is no phlebitis or edema.  There is no cyanosis or clubbing.  Strength is  normal and symmetrical in all extremities.  There is no lateralizing weakness.  There are no sensory deficits.  The skin is warm and dry.  There is no rash.  EKG shows low atrial rhythm otherwise within normal limits.   Assessment / Plan:  Continue same medication.  Recheck in 4 months for office visit EKG lipid panel hepatic function panel and basal metabolic panel.

## 2013-04-07 NOTE — Assessment & Plan Note (Signed)
The patient has not been aware of any recent atrial fibrillation.  He has not had to use his flecainide pill in the pocket recently.

## 2013-04-11 ENCOUNTER — Telehealth: Payer: Self-pay | Admitting: Physician Assistant

## 2013-04-11 NOTE — Telephone Encounter (Signed)
Patient contacted me this morning reporting mildly elevated heart rate in the 110-115 range, as compared to baseline 60-70s. BP 140/95. He noted this yesterday, and took total of 300 mg of flecainide, as he had been instructed in the past. He otherwise denies any significant associated symptoms. He reports having had significantly delayed conversion to NSR in the past, once not until the following day, following self treatment with flecainide. Given that he had taken his scheduled doses of Lopressor and Cardizem this morning, I advised him to take an additional 50 mg of Lopressor this afternoon, if his HR remained above 100 bpm.  He also indicated that he is on Coumadin anticoagulation. Patient was appreciative of the call back and the recommendation.

## 2013-04-13 ENCOUNTER — Telehealth: Payer: Self-pay | Admitting: Family Medicine

## 2013-04-13 NOTE — Telephone Encounter (Signed)
Pt would like MD to return his call. This call in not related to pt visit on 03-09-13.

## 2013-04-22 ENCOUNTER — Other Ambulatory Visit: Payer: Self-pay

## 2013-04-30 ENCOUNTER — Ambulatory Visit (INDEPENDENT_AMBULATORY_CARE_PROVIDER_SITE_OTHER): Payer: BC Managed Care – PPO | Admitting: *Deleted

## 2013-04-30 DIAGNOSIS — I480 Paroxysmal atrial fibrillation: Secondary | ICD-10-CM

## 2013-04-30 DIAGNOSIS — I4891 Unspecified atrial fibrillation: Secondary | ICD-10-CM | POA: Diagnosis not present

## 2013-05-06 ENCOUNTER — Telehealth: Payer: Self-pay | Admitting: Cardiology

## 2013-05-06 NOTE — Telephone Encounter (Signed)
Spoke with patient at 12:00 and he stated Afib started yesterday and he took 3 Flecainide 100 mg and this am heart rate 130 blood pressure 96/67. Discussed with  [name] and will have him take Flecainide now and then again in 12 hours if still has fast heart rate. If no better, call back. Patient verbalized understanding.

## 2013-05-06 NOTE — Telephone Encounter (Signed)
New Problem  Pt states he has had an irregular heartbeat and that it is no longer in rhythm// request a call back to discuss.

## 2013-05-07 ENCOUNTER — Telehealth: Payer: Self-pay | Admitting: Cardiology

## 2013-05-07 ENCOUNTER — Other Ambulatory Visit (INDEPENDENT_AMBULATORY_CARE_PROVIDER_SITE_OTHER): Payer: BC Managed Care – PPO

## 2013-05-07 ENCOUNTER — Other Ambulatory Visit: Payer: Self-pay | Admitting: Cardiology

## 2013-05-07 DIAGNOSIS — I4891 Unspecified atrial fibrillation: Secondary | ICD-10-CM

## 2013-05-07 LAB — BASIC METABOLIC PANEL
BUN: 27 mg/dL — ABNORMAL HIGH (ref 6–23)
Chloride: 106 mEq/L (ref 96–112)
Creatinine, Ser: 1.2 mg/dL (ref 0.4–1.5)

## 2013-05-07 LAB — CBC WITH DIFFERENTIAL/PLATELET
Basophils Absolute: 0 10*3/uL (ref 0.0–0.1)
Eosinophils Absolute: 0.1 10*3/uL (ref 0.0–0.7)
Eosinophils Relative: 0.5 % (ref 0.0–5.0)
MCHC: 33.6 g/dL (ref 30.0–36.0)
MCV: 84.2 fl (ref 78.0–100.0)
Monocytes Absolute: 0.9 10*3/uL (ref 0.1–1.0)
Neutrophils Relative %: 79.6 % — ABNORMAL HIGH (ref 43.0–77.0)
Platelets: 236 10*3/uL (ref 150.0–400.0)
RDW: 14.6 % (ref 11.5–14.6)
WBC: 12.4 10*3/uL — ABNORMAL HIGH (ref 4.5–10.5)

## 2013-05-07 NOTE — Telephone Encounter (Signed)
Spoke with patient who states his pulse is 135 this morning; BP 107/78.  Patient c/o feeling light-headed; denies chest pressure, SOB, nausea.  Patient states the elevated heart rate began Tuesday morning and per Dr. Sherryl Barters instructions, patient took Flecainide 100 mg at 1200 yesterday and again at 2230.  Patient states [name] instructed him to call this morning if HR remained elevated.  Patient states he would like this to be taken care of today as it has been going on for 3 days now and he is tired of feeling this way.  I advised that I will send message to and attempt to call  [name] for advice.

## 2013-05-07 NOTE — Telephone Encounter (Signed)
New Problem  Pt states pulse is twice what it should be// States [name] wanted him to call first thing this morning.

## 2013-05-07 NOTE — Progress Notes (Signed)
The patient called the office early this am to state that he was still in atrial fibrillation with rapid ventricular rate 130s. We made the decision to cardiovert him.  There is no space on anaesthesia schedule today.  We will plan DCCV for tomorrow morning at 8:00. He will get his pre-op lab work today.

## 2013-05-07 NOTE — Telephone Encounter (Signed)
[name] called and I updated him on the patient's condition.  [name] advised that we schedule patient for cardioversion today if possible. The schedule for today is full but patient was offered 0800 slot tomorrow for cardioversion.  Patient elected this option and will come today to the office for labs.  Patient verbalized understanding to be NPO after midnight tonight and to arrive at [facility] at [facility] for the procedure.  Labs ordered in [facility] and patient scheduled for 2 pm lab appointment.  I notified [name] of the schedule and he will be the attending for the procedure.  Patient verbalized understanding of all instructions and procedure was scheduled with OR scheduling.  Patient advised that he may take Flecainide today and to call us if he converts to normal rhythm.  Patient verbalized understanding.

## 2013-05-08 ENCOUNTER — Ambulatory Visit (HOSPITAL_COMMUNITY)
Admission: RE | Admit: 2013-05-08 | Discharge: 2013-05-08 | Disposition: A | Discharge status: Home / Self Care | Payer: BC Managed Care – PPO | Source: Ambulatory Visit | Attending: Internal Medicine | Admitting: Internal Medicine

## 2013-05-08 ENCOUNTER — Encounter (HOSPITAL_COMMUNITY): Payer: Self-pay | Admitting: Anesthesiology

## 2013-05-08 ENCOUNTER — Ambulatory Visit (HOSPITAL_COMMUNITY): Payer: BC Managed Care – PPO | Admitting: Anesthesiology

## 2013-05-08 ENCOUNTER — Encounter (HOSPITAL_COMMUNITY)
Admission: RE | Disposition: A | Discharge status: Home / Self Care | Payer: Self-pay | Source: Ambulatory Visit | Attending: Internal Medicine

## 2013-05-08 DIAGNOSIS — I110 Hypertensive heart disease with heart failure: Secondary | ICD-10-CM | POA: Insufficient documentation

## 2013-05-08 DIAGNOSIS — F329 Major depressive disorder, single episode, unspecified: Secondary | ICD-10-CM | POA: Insufficient documentation

## 2013-05-08 DIAGNOSIS — I4891 Unspecified atrial fibrillation: Secondary | ICD-10-CM | POA: Insufficient documentation

## 2013-05-08 DIAGNOSIS — K219 Gastro-esophageal reflux disease without esophagitis: Secondary | ICD-10-CM | POA: Insufficient documentation

## 2013-05-08 DIAGNOSIS — Z888 Allergy status to other drugs, medicaments and biological substances status: Secondary | ICD-10-CM | POA: Insufficient documentation

## 2013-05-08 DIAGNOSIS — J45909 Unspecified asthma, uncomplicated: Secondary | ICD-10-CM | POA: Insufficient documentation

## 2013-05-08 DIAGNOSIS — E7800 Pure hypercholesterolemia, unspecified: Secondary | ICD-10-CM | POA: Insufficient documentation

## 2013-05-08 DIAGNOSIS — I359 Nonrheumatic aortic valve disorder, unspecified: Secondary | ICD-10-CM | POA: Insufficient documentation

## 2013-05-08 DIAGNOSIS — I4892 Unspecified atrial flutter: Secondary | ICD-10-CM | POA: Diagnosis not present

## 2013-05-08 DIAGNOSIS — Z91018 Allergy to other foods: Secondary | ICD-10-CM | POA: Insufficient documentation

## 2013-05-08 DIAGNOSIS — I509 Heart failure, unspecified: Secondary | ICD-10-CM | POA: Insufficient documentation

## 2013-05-08 DIAGNOSIS — Z7901 Long term (current) use of anticoagulants: Secondary | ICD-10-CM | POA: Insufficient documentation

## 2013-05-08 DIAGNOSIS — Z79899 Other long term (current) drug therapy: Secondary | ICD-10-CM | POA: Insufficient documentation

## 2013-05-08 HISTORY — PX: CARDIOVERSION: SHX1299

## 2013-05-08 LAB — PROTIME-INR: Prothrombin Time: 22.7 seconds — ABNORMAL HIGH (ref 11.6–15.2)

## 2013-05-08 SURGERY — CARDIOVERSION
Anesthesia: General

## 2013-05-08 MED ORDER — SODIUM CHLORIDE 0.9 % IV SOLN
INTRAVENOUS | Status: DC | PRN
  Administered 2013-05-08: 08:00:00 via INTRAVENOUS

## 2013-05-08 MED ORDER — PROPOFOL 10 MG/ML IV BOLUS
INTRAVENOUS | Status: DC | PRN
  Administered 2013-05-08: 150 mg via INTRAVENOUS

## 2013-05-08 MED ORDER — SODIUM CHLORIDE 0.9 % IV SOLN
INTRAVENOUS | Status: DC
  Administered 2013-05-08: 500 mL via INTRAVENOUS

## 2013-05-08 MED ORDER — LIDOCAINE HCL (CARDIAC) 20 MG/ML IV SOLN
INTRAVENOUS | Status: DC | PRN
  Administered 2013-05-08: 80 mg via INTRAVENOUS

## 2013-05-08 MED ORDER — LACTATED RINGERS IV SOLN
INTRAVENOUS | Status: DC | PRN
  Administered 2013-05-08: 09:00:00 via INTRAVENOUS

## 2013-05-08 NOTE — Anesthesia Postprocedure Evaluation (Signed)
  Anesthesia Post-op Note  Patient: Francis Campbell  Procedure(s) Performed: Procedure(s): CARDIOVERSION (N/A)  Patient Location: PACU and Endoscopy Unit  Anesthesia Type:General  Level of Consciousness: awake, alert  and oriented  Airway and Oxygen Therapy: Patient Spontanous Breathing  Post-op Pain: none  Post-op Assessment: Post-op Vital signs reviewed and Patient's Cardiovascular Status Stable  Post-op Vital Signs: Reviewed  Complications: No apparent anesthesia complications

## 2013-05-08 NOTE — H&P (Signed)
Patient with history of paroxysmal atrial fibrillation on long term warfarin. Went back into atrial fibrillation on 05/05/13.  He failed to convert on flecainide "pill int the pocket" 300 mg. Feels weak . Outpatient DCCV arranged for today.

## 2013-05-08 NOTE — Anesthesia Postprocedure Evaluation (Signed)
  Anesthesia Post-op Note  Patient: Dave Mergen Mcmullan JR  Procedure(s) Performed: Procedure(s): CARDIOVERSION (N/A)  Patient Location: PACU and Endoscopy Unit  Anesthesia Type:General  Level of Consciousness: awake, alert  and patient cooperative  Airway and Oxygen Therapy: Patient Spontanous Breathing and Patient connected to nasal cannula oxygen  Post-op Pain: none  Post-op Assessment: Post-op Vital signs reviewed, Patient's Cardiovascular Status Stable, Respiratory Function Stable, Patent Airway and No signs of Nausea or vomiting  Post-op Vital Signs: Reviewed and stable  Complications: No apparent anesthesia complications

## 2013-05-08 NOTE — Transfer of Care (Signed)
Immediate Anesthesia Transfer of Care Note  Patient: Francis Campbell  Procedure(s) Performed: Procedure(s): CARDIOVERSION (N/A)  Patient Location: PACU and Endoscopy Unit  Anesthesia Type:General  Level of Consciousness: awake, alert  and patient cooperative  Airway & Oxygen Therapy: Patient Spontanous Breathing and Patient connected to nasal cannula oxygen  Post-op Assessment: Report given to PACU RN and Patient moving all extremities  Post vital signs: Reviewed and stable  Complications: No apparent anesthesia complications

## 2013-05-08 NOTE — Anesthesia Preprocedure Evaluation (Addendum)
Anesthesia Evaluation  Patient identified by MRN, date of birth, ID band Patient awake    Reviewed: Allergy & Precautions, H&P , NPO status   History of Anesthesia Complications Negative for: history of anesthetic complications  Airway Mallampati: I TM Distance: >3 FB Neck ROM: Full    Dental  (+) Dental Advisory Given and Teeth Intact   Pulmonary asthma ,  Patient denies asthma breath sounds clear to auscultation  Pulmonary exam normal       Cardiovascular hypertension, Pt. on medications and Pt. on home beta blockers + dysrhythmias Atrial Fibrillation Rhythm:Irregular Rate:Tachycardia     Neuro/Psych Only one incident last year after drinking Gatorade.negative neurological ROS     GI/Hepatic Neg liver ROS, GERD-  Medicated,  Endo/Other  negative endocrine ROS  Renal/GU negative Renal ROS     Musculoskeletal negative musculoskeletal ROS (+)   Abdominal Normal abdominal exam  (+)   Peds  Hematology negative hematology ROS (+)   Anesthesia Other Findings   Reproductive/Obstetrics negative OB ROS                          Anesthesia Physical Anesthesia Plan  ASA: III  Anesthesia Plan: General   Post-op Pain Management:    Induction: Intravenous  Airway Management Planned: Mask  Additional Equipment:   Intra-op Plan:   Post-operative Plan:   Informed Consent: I have reviewed the patients History and Physical, chart, labs and discussed the procedure including the risks, benefits and alternatives for the proposed anesthesia with the patient or authorized representative who has indicated his/her understanding and acceptance.   Dental advisory given  Plan Discussed with: CRNA, Anesthesiologist and Surgeon  Anesthesia Plan Comments:        Anesthesia Quick Evaluation

## 2013-05-08 NOTE — CV Procedure (Signed)
Electrical Cardioversion Procedure Note Francis Campbell [id number] 04/27/1946  Procedure: Electrical Cardioversion Indications:  Atrial Flutter  Procedure Details Consent: Risks of procedure as well as the alternatives and risks of each were explained to the (patient/caregiver).  Consent for procedure obtained. Time Out: Verified patient identification, verified procedure, site/side was marked, verified correct patient position, special equipment/implants available, medications/allergies/relevent history reviewed, required imaging and test results available.  Performed  Patient placed on cardiac monitor, pulse oximetry, supplemental oxygen as necessary.  Sedation given: propofol 141m Pacer pads placed anterior and posterior chest.  Cardioverted 1 time(s).  Cardioverted at [place]  Evaluation Findings: Post procedure EKG shows: NSR Complications: None Patient did tolerate procedure well.   Francis Coco8/22/2014, 8:26 AM

## 2013-05-11 ENCOUNTER — Encounter (HOSPITAL_COMMUNITY): Payer: Self-pay | Admitting: Cardiology

## 2013-05-11 NOTE — Telephone Encounter (Signed)
Spoke with patient and he just wanted to let  [name] know how much he appreciated his assistance last week with the cardioversion. Will forward to  [name]

## 2013-05-11 NOTE — Telephone Encounter (Signed)
Thanks for the followup!

## 2013-05-11 NOTE — Telephone Encounter (Signed)
New Problem   Pt calling to give an update for a flutter/// would not give details.

## 2013-05-20 ENCOUNTER — Telehealth: Payer: Self-pay | Admitting: Family Medicine

## 2013-05-20 NOTE — Telephone Encounter (Signed)
Patient Information:  Caller Name: [place]  Phone: [phone]  Patient: Francis Campbell, Francis Campbell  Gender: Male  DOB: [birth year]  Age: 67 Years  PCP: [name] [facility])  Office Follow Up:  Does the office need to follow up with this patient?: Yes  Instructions For The Office: Requests MD recommendation regarding otc procuct for cold symptoms.  RN Note:  Patient and wife suspect "low grade " temperature per tactile.  Nasal  congestion, intermittent sneezing and occasional cough.  Mild sore throat 05/19/13 resolved. Mucinex in past has not been effective.  Advised to hydrate and humidify; try nasal saline.  BP 132/86, Pulse 70.  Declined appointment.  Will come in to be seen if fever continues or symptoms worsen.  Would like MD recommendation for otc product that might be helpful and will not aggravate atrial fib. Please call back.  Symptoms  Reason For Call & Symptoms: Called to ask what medication to take for cold with clear runny nose and occasional cough,  Due to history of atrial-fib, asking for medication safe to use.  Reviewed Health History In EMR: Yes  Reviewed Medications In EMR: Yes  Reviewed Allergies In EMR: Yes  Reviewed Surgeries / Procedures: Yes  Date of Onset of Symptoms: 05/17/2013  Treatments Tried: Alka-Seltzer cold, cough drops  Treatments Tried Worked: No  Any Fever: Yes  Fever Taken: Tactile  Fever Time Of Reading: 13:00:00  Fever Last Reading: N/A  Guideline(s) Used:  Colds  Disposition Per Guideline:   Go to Office Now  Reason For Disposition Reached:   Fever > 100.5 F (38.1 C) and over 19 years of age  Advice Given:  Reassurance  Colds are very common and may make you feel uncomfortable.  Colds are caused by viruses, and no medicine or "shot" will cure an uncomplicated cold.  For a Stuffy Nose - Use Nasal Washes:  Introduction: Saline (salt water) nasal irrigation (nasal wash) is an effective and simple home remedy for treating stuffy nose  and sinus congestion. The nose can be irrigated by pouring, spraying, or squirting salt water into the nose and then letting it run back out.  Humidifier:  If the air in your home is dry, use a cool-mist humidifier  Expected Course:   Fever may last 2-3 days  Nasal discharge 7-14 days  Cough up to 2-3 weeks.  Call Back If:  Difficulty breathing occurs  Fever lasts more than 3 days  Nasal discharge lasts more than 10 days  Cough lasts more than 3 weeks  You become worse  Patient Refused Recommendation:  Patient Refused Care Advice  Declined to be seen for cold symptoms.

## 2013-05-20 NOTE — Telephone Encounter (Signed)
Avoid decongestants.  Tylenol OK for body aches.  Delsym would be OK for cough.

## 2013-05-20 NOTE — Telephone Encounter (Signed)
Pt informed

## 2013-05-21 ENCOUNTER — Encounter: Payer: Self-pay | Admitting: Family Medicine

## 2013-05-21 NOTE — Telephone Encounter (Signed)
I spoke with patient. He feels much better today.  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 He never had allergy to this but suspicion this might have triggered his Atrial fibrillation.

## 2013-05-27 ENCOUNTER — Ambulatory Visit: Payer: BC Managed Care – PPO | Admitting: Cardiology

## 2013-06-05 ENCOUNTER — Ambulatory Visit (INDEPENDENT_AMBULATORY_CARE_PROVIDER_SITE_OTHER): Payer: BC Managed Care – PPO | Admitting: Cardiology

## 2013-06-05 ENCOUNTER — Encounter: Payer: Self-pay | Admitting: Cardiology

## 2013-06-05 VITALS — BP 142/78 | HR 56 | Ht 74.0 in | Wt 191.0 lb

## 2013-06-05 DIAGNOSIS — I4891 Unspecified atrial fibrillation: Secondary | ICD-10-CM | POA: Diagnosis not present

## 2013-06-05 DIAGNOSIS — I359 Nonrheumatic aortic valve disorder, unspecified: Secondary | ICD-10-CM

## 2013-06-05 DIAGNOSIS — I351 Nonrheumatic aortic (valve) insufficiency: Secondary | ICD-10-CM

## 2013-06-05 DIAGNOSIS — I480 Paroxysmal atrial fibrillation: Secondary | ICD-10-CM

## 2013-06-05 DIAGNOSIS — E7800 Pure hypercholesterolemia, unspecified: Secondary | ICD-10-CM

## 2013-06-05 DIAGNOSIS — I119 Hypertensive heart disease without heart failure: Secondary | ICD-10-CM | POA: Diagnosis not present

## 2013-06-05 MED ORDER — WARFARIN SODIUM 5 MG PO TABS: 5.0000 mg | ORAL_TABLET | Freq: Every day | ORAL | Status: DC

## 2013-06-05 NOTE — Patient Instructions (Addendum)
Your physician recommends that you continue on your current medications as directed. Please refer to the Current Medication list given to you today.  Keep your November appointment

## 2013-06-05 NOTE — Assessment & Plan Note (Signed)
The blood pressure was remaining stable on current therapy.  The patient is watching his diet.  His weight is up 1 pound since last visit.  He gets daily exercise by walking his dog each morning before he goes to work.

## 2013-06-05 NOTE — Assessment & Plan Note (Signed)
The patient does have a history of known aortic valve disease with very mild aortic insufficiency.  He is not having any symptoms of CHF or angina pectoris

## 2013-06-05 NOTE — Progress Notes (Signed)
Francis Campbell Date of Birth:  12/14/1945 [facility] [address] [place], [place]  [address] [phone]         Fax   [phone]  History of Present Illness: This pleasant 67 year old gentleman is seen for a post cardioversion office visit.  The patient recently underwent outpatient direct current cardioversion after conservative measures have failed to convert him back to sinus rhythm.  He has been on long-term anticoagulation. He has a history of paroxysmal atrial fibrillation and a history of essential hypertension. His previous echocardiograms have shown mild aortic insufficiency. He has had atypical chest pain and has had previously normal nuclear stress tests. He had a stress Myoview on 09/27/11 which showed no ischemia and his ejection fraction was 62%. Overall since last visit he has been feeling well. He is on long-term Coumadin because of his paroxysmal atrial flutter. Since we last saw him he had another admission to the hospital with paroxysmal atrial flutter fibrillation 09/30/12- 10/04/12.  He subsequently saw [name] on 11/17/12 to discuss consideration of catheter ablation. At this point he would like to try medical therapy a while longer. He does have flecainide as a "pill in the pocket" and takes 3 100 mg tablets at the onset of his atrial fibrillation. He had successful cardioversion on 05/08/13.  Since then he has felt well with no sensation of any recent or recurrent arrhythmias.  Current Outpatient Prescriptions  Medication Sig Dispense Refill  . ALPRAZolam (XANAX) 0.25 MG tablet TAKE 1 TABLET DAILY AS NEEDED  90 tablet  1  . Cholecalciferol (VITAMIN D3) 1000 UNITS CAPS Take 2 capsules by mouth daily.       [name] diltiazem (CARDIZEM CD) 240 MG 24 hr capsule Take 240 mg by mouth every morning.      . flecainide (TAMBOCOR) 100 MG tablet Take 100 mg by mouth as needed.      . lovastatin (MEVACOR) 20 MG tablet Take 20 mg by mouth every morning.      .  metoprolol succinate (TOPROL-XL) 100 MG 24 hr tablet Take 100 mg by mouth daily. Take with or immediately following a meal.      . Multiple Vitamin (MULTIVITAMIN) tablet Take 1 tablet by mouth daily.      . sertraline (ZOLOFT) 50 MG tablet Take 1 tablet (50 mg total) by mouth at bedtime.  90 tablet  3  . warfarin (COUMADIN) 10 MG tablet Take 5-10 mg by mouth daily. Take 43m on Mon, wed, fri.   and take 5 mg on Tuesday Thursday, Sat and Sunday.      . warfarin (COUMADIN) 5 MG tablet Take 1 tablet (5 mg total) by mouth daily.  30 tablet  5  . [DISCONTINUED] digoxin (LANOXIN) 0.125 MG tablet Take 1 tablet (125 mcg total) by mouth daily.  90 tablet  3  . [DISCONTINUED] losartan-hydrochlorothiazide (HYZAAR) 100-12.5 MG per tablet       . [DISCONTINUED] pantoprazole (PROTONIX) 40 MG tablet Take 1 tablet (40 mg total) by mouth daily.  30 tablet  1   No current facility-administered medications for this visit.    Allergies  Allergen Reactions  . Amiodarone     Abn  thyroid  . Flomax [Tamsulosin Hcl]     ? Causes A Fib  . Floxin [Ofloxacin]     hallucinations  . Lisinopril Cough  . Tavist Allergy [Clemastine Fumarate]     A fib worse  . Chocolate Palpitations  . Dextromethorphan Palpitations  .  Robitussin Cf [Pseudoephedrine-Dm-Gg] Palpitations    Patient Active Problem List   Diagnosis Date Noted  . Paroxysmal atrial fibrillation     Priority: High  . Hypercholesteremia     Priority: Medium  . Hypertension     Priority: Medium  . Atrial flutter 11/17/2012  . Anxiety 10/03/2012  . Benign hypertensive heart disease without heart failure 11/28/2011  . Regional enteritis of unspecified site 11/07/2011  . Special screening for malignant neoplasms, colon 11/07/2011  . Duodenitis 11/07/2011  . Major depression 11/06/2011  . Abdominal pain 10/22/2011  . Crohn disease 10/22/2011  . Anticoagulated on Coumadin 10/22/2011  . AF (atrial fibrillation) 10/22/2011  . Chest pain radiating to  arm 09/14/2011  . Leucocytosis 09/09/2011  . Cough, persistent 07/30/2011  . Cough 07/30/2011  . Aortic insufficiency   . Systolic murmur     History  Smoking status  . Never Smoker   Smokeless tobacco  . Never Used    History  Alcohol Use No    Family History  Problem Relation Age of Onset  . Stroke Mother   . Hypertension Mother   . Kidney disease Mother     stones  . Nephrolithiasis Brother   . Arthritis Paternal Grandmother   . Heart disease Paternal Grandmother   . Leukemia Father   . Heart disease Father   . Nephrolithiasis Father     Review of Systems: Constitutional: no fever chills diaphoresis or fatigue or change in weight.  Head and neck: no hearing loss, no epistaxis, no photophobia or visual disturbance. Respiratory: No cough, shortness of breath or wheezing. Cardiovascular: No chest pain peripheral edema, palpitations. Gastrointestinal: No abdominal distention, no abdominal pain, no change in bowel habits hematochezia or melena. Genitourinary: No dysuria, no frequency, no urgency, no nocturia. Musculoskeletal:No arthralgias, no back pain, no gait disturbance or myalgias. Neurological: No dizziness, no headaches, no numbness, no seizures, no syncope, no weakness, no tremors. Hematologic: No lymphadenopathy, no easy bruising. Psychiatric: No confusion, no hallucinations, no sleep disturbance.    Physical Exam: Filed Vitals:   06/05/13 1042  BP: 142/78  Pulse: 56   the general appearance reveals a well-developed well-nourished gentleman in no distress.The head and neck exam reveals pupils equal and reactive.  Extraocular movements are full.  There is no scleral icterus.  The mouth and pharynx are normal.  The neck is supple.  The carotids reveal no bruits.  The jugular venous pressure is normal.  The  thyroid is not enlarged.  There is no lymphadenopathy.  The chest is clear to percussion and auscultation.  There are no rales or rhonchi.  Expansion of  the chest is symmetrical.  The precordium is quiet.  The first heart sound is normal.  The second heart sound is physiologically split.  There is no murmur gallop rub or click.  There is no abnormal lift or heave.  The abdomen is soft and nontender.  The bowel sounds are normal.  The liver and spleen are not enlarged.  There are no abdominal masses.  There are no abdominal bruits.  Extremities reveal good pedal pulses.  There is no phlebitis or edema.  There is no cyanosis or clubbing.  Strength is normal and symmetrical in all extremities.  There is no lateralizing weakness.  There are no sensory deficits.  The skin is warm and dry.  There is no rash.  EKG shows sinus bradycardia and otherwise within normal limits.   Assessment / Plan:  Continue same medication.  Return  for regularly scheduled appointment in November and we will be getting blood work at that time.

## 2013-06-05 NOTE — Assessment & Plan Note (Signed)
Patient has a history of paroxysmal atrial fibrillation.  He is on diltiazem and metoprolol.  He is on long-term warfarin.  He has flecainide 100 mg tablets and uses 3 of them stat for a "pill in the pocket".  The patient has not been having any TIA symptoms

## 2013-06-15 ENCOUNTER — Telehealth: Payer: Self-pay | Admitting: Cardiology

## 2013-06-15 ENCOUNTER — Ambulatory Visit (INDEPENDENT_AMBULATORY_CARE_PROVIDER_SITE_OTHER): Payer: BC Managed Care – PPO

## 2013-06-15 ENCOUNTER — Other Ambulatory Visit: Payer: Self-pay | Admitting: *Deleted

## 2013-06-15 DIAGNOSIS — I480 Paroxysmal atrial fibrillation: Secondary | ICD-10-CM

## 2013-06-15 DIAGNOSIS — I4891 Unspecified atrial fibrillation: Secondary | ICD-10-CM | POA: Diagnosis not present

## 2013-06-15 MED ORDER — WARFARIN SODIUM 5 MG PO TABS: 5.0000 mg | ORAL_TABLET | Freq: Every day | ORAL | Status: DC

## 2013-06-15 NOTE — Telephone Encounter (Deleted)
Error

## 2013-07-01 MED ORDER — WARFARIN SODIUM 5 MG PO TABS: ORAL_TABLET | ORAL | Status: DC

## 2013-07-01 NOTE — Telephone Encounter (Signed)
Spoke with [facility] and confirmed Rx advised patient

## 2013-07-01 NOTE — Telephone Encounter (Signed)
Pt states he needs to speak about a coumadin refill/// Needs to change dosage from 5-10 mg// Spoke with coumadin clinic w/ no results// please assist further.

## 2013-07-23 ENCOUNTER — Other Ambulatory Visit: Payer: Self-pay

## 2013-07-26 ENCOUNTER — Other Ambulatory Visit: Payer: Self-pay | Admitting: Cardiology

## 2013-07-27 ENCOUNTER — Ambulatory Visit (INDEPENDENT_AMBULATORY_CARE_PROVIDER_SITE_OTHER): Payer: BC Managed Care – PPO | Admitting: *Deleted

## 2013-07-27 ENCOUNTER — Telehealth: Payer: Self-pay

## 2013-07-27 DIAGNOSIS — I4891 Unspecified atrial fibrillation: Secondary | ICD-10-CM

## 2013-07-27 DIAGNOSIS — I480 Paroxysmal atrial fibrillation: Secondary | ICD-10-CM

## 2013-07-27 NOTE — Telephone Encounter (Signed)
PATIENT CALL ABOUT THE REFILL OF LOVASTATION, CALLED THE [facility] TO SEE IF THEY GOT THE RX AND I CALLED THE PATIENT BACKTO LET HIM KNOW

## 2013-07-28 ENCOUNTER — Other Ambulatory Visit: Payer: Self-pay

## 2013-07-28 MED ORDER — LOVASTATIN 20 MG PO TABS: 20.0000 mg | ORAL_TABLET | Freq: Every morning | ORAL | Status: DC

## 2013-08-06 ENCOUNTER — Encounter: Payer: Self-pay | Admitting: Cardiology

## 2013-08-06 ENCOUNTER — Ambulatory Visit (INDEPENDENT_AMBULATORY_CARE_PROVIDER_SITE_OTHER): Payer: BC Managed Care – PPO | Admitting: Cardiology

## 2013-08-06 VITALS — BP 130/89 | HR 63 | Ht 74.0 in | Wt 190.0 lb

## 2013-08-06 DIAGNOSIS — I351 Nonrheumatic aortic (valve) insufficiency: Secondary | ICD-10-CM

## 2013-08-06 DIAGNOSIS — N401 Enlarged prostate with lower urinary tract symptoms: Secondary | ICD-10-CM

## 2013-08-06 DIAGNOSIS — I480 Paroxysmal atrial fibrillation: Secondary | ICD-10-CM

## 2013-08-06 DIAGNOSIS — I359 Nonrheumatic aortic valve disorder, unspecified: Secondary | ICD-10-CM

## 2013-08-06 DIAGNOSIS — E7800 Pure hypercholesterolemia, unspecified: Secondary | ICD-10-CM | POA: Diagnosis not present

## 2013-08-06 DIAGNOSIS — I119 Hypertensive heart disease without heart failure: Secondary | ICD-10-CM

## 2013-08-06 DIAGNOSIS — I4891 Unspecified atrial fibrillation: Secondary | ICD-10-CM | POA: Diagnosis not present

## 2013-08-06 LAB — BASIC METABOLIC PANEL
BUN: 18 mg/dL (ref 6–23)
CO2: 29 mEq/L (ref 19–32)
Chloride: 102 mEq/L (ref 96–112)
Creatinine, Ser: 0.9 mg/dL (ref 0.4–1.5)
Glucose, Bld: 103 mg/dL — ABNORMAL HIGH (ref 70–99)

## 2013-08-06 LAB — PSA: PSA: 1.6 ng/mL (ref 0.10–4.00)

## 2013-08-06 LAB — HEPATIC FUNCTION PANEL
Alkaline Phosphatase: 72 U/L (ref 39–117)
Bilirubin, Direct: 0.2 mg/dL (ref 0.0–0.3)

## 2013-08-06 LAB — LIPID PANEL
Total CHOL/HDL Ratio: 6
VLDL: 50.4 mg/dL — ABNORMAL HIGH (ref 0.0–40.0)

## 2013-08-06 LAB — LDL CHOLESTEROL, DIRECT: Direct LDL: 159.1 mg/dL

## 2013-08-06 MED ORDER — LOSARTAN POTASSIUM 25 MG PO TABS: ORAL_TABLET | ORAL | Status: DC

## 2013-08-06 NOTE — Assessment & Plan Note (Signed)
The patient has not had any recurrent atrial fibrillation since last visit.  His last cardioversion was in August 2014

## 2013-08-06 NOTE — Assessment & Plan Note (Signed)
Blood pressure at home has been running high recently.  We will restart low dose ARB in the form of losartan 25 mg tablets and he will start out with just one half tablet daily and work up if necessary.  He previously had developed a cough on lisinopril but recalls that he previously had taken losartan without difficulty

## 2013-08-06 NOTE — Patient Instructions (Addendum)
START LOSARTAN 25 MG 1/2 TABLET DAILY  Will obtain labs today and call you with the results ([id number])  Your physician recommends that you schedule a follow-up appointment in: 4 months with fasting labs (lp/bmet/hfp)

## 2013-08-06 NOTE — Assessment & Plan Note (Signed)
The patient has mild aortic insufficiency by echo but is having no symptoms of CHF

## 2013-08-06 NOTE — Progress Notes (Signed)
Francis Campbell Date of Birth:  1945-10-31 [address] [place], [place]  [address] [phone]         Fax   [phone]  History of Present Illness: This pleasant 67 year old gentleman is seen for a scheduled followup office visit.  The patient  underwent outpatient direct current cardioversion on 05/08/13 after conservative measures  failed to convert him back to sinus rhythm.  He has been on long-term anticoagulation. He has a history of paroxysmal atrial fibrillation and a history of essential hypertension.  He has a pill in the pocket flecainide on hand. His previous echocardiograms have shown mild aortic insufficiency. He has had atypical chest pain and has had previously normal nuclear stress tests. He had a stress Myoview on 09/27/11 which showed no ischemia and his ejection fraction was 62%. Overall since last visit he has been feeling well. He is on long-term Coumadin because of his paroxysmal atrial flutter. Since we last saw him he had another admission to the hospital with paroxysmal atrial flutter fibrillation 09/30/12- 10/04/12.  He subsequently saw [name] on 11/17/12 to discuss consideration of catheter ablation. At this point he would like to try medical therapy a while longer. He does have flecainide as a "pill in the pocket" and takes 3 100 mg tablets at the onset of his atrial fibrillation. He had successful cardioversion on 05/08/13.    Current Outpatient Prescriptions  Medication Sig Dispense Refill  . ALPRAZolam (XANAX) 0.25 MG tablet TAKE 1 TABLET DAILY AS NEEDED  90 tablet  1  . Cholecalciferol (VITAMIN D3) 1000 UNITS CAPS Take 2 capsules by mouth daily.       [name] diltiazem (CARDIZEM CD) 240 MG 24 hr capsule Take 240 mg by mouth every morning.      . flecainide (TAMBOCOR) 100 MG tablet Take 100 mg by mouth as needed.      . lovastatin (MEVACOR) 20 MG tablet Take 1 tablet (20 mg total) by mouth every morning.  30 tablet  1  . metoprolol succinate  (TOPROL-XL) 100 MG 24 hr tablet Take 100 mg by mouth daily. Take with or immediately following a meal.      . Multiple Vitamin (MULTIVITAMIN) tablet Take 1 tablet by mouth daily.      . sertraline (ZOLOFT) 50 MG tablet Take 1 tablet (50 mg total) by mouth at bedtime.  90 tablet  3  . warfarin (COUMADIN) 5 MG tablet 5 mg x 4 days and 10 mg x 3 days or as directed by coumadin clinic  130 tablet  1  . losartan (COZAAR) 25 MG tablet 1/2 tablet daily  45 tablet  3  . [DISCONTINUED] digoxin (LANOXIN) 0.125 MG tablet Take 1 tablet (125 mcg total) by mouth daily.  90 tablet  3  . [DISCONTINUED] losartan-hydrochlorothiazide (HYZAAR) 100-12.5 MG per tablet       . [DISCONTINUED] pantoprazole (PROTONIX) 40 MG tablet Take 1 tablet (40 mg total) by mouth daily.  30 tablet  1   No current facility-administered medications for this visit.    Allergies  Allergen Reactions  . Amiodarone     Abn  thyroid  . Flomax [Tamsulosin Hcl]     ? Causes A Fib  . Floxin [Ofloxacin]     hallucinations  . Lisinopril Cough  . Tavist Allergy [Clemastine Fumarate]     A fib worse  . Chocolate Palpitations  . Dextromethorphan Palpitations  . Robitussin Cf [Pseudoephedrine-Dm-Gg] Palpitations    Patient Active  Problem List   Diagnosis Date Noted  . Paroxysmal atrial fibrillation     Priority: High  . Hypercholesteremia     Priority: Medium  . BPH associated with nocturia 08/06/2013  . Atrial flutter 11/17/2012  . Anxiety 10/03/2012  . Benign hypertensive heart disease without heart failure 11/28/2011  . Regional enteritis of unspecified site 11/07/2011  . Special screening for malignant neoplasms, colon 11/07/2011  . Duodenitis 11/07/2011  . Major depression 11/06/2011  . Abdominal pain 10/22/2011  . Crohn disease 10/22/2011  . Anticoagulated on Coumadin 10/22/2011  . AF (atrial fibrillation) 10/22/2011  . Chest pain radiating to arm 09/14/2011  . Leucocytosis 09/09/2011  . Cough, persistent 07/30/2011    . Cough 07/30/2011  . Aortic insufficiency   . Systolic murmur     History  Smoking status  . Never Smoker   Smokeless tobacco  . Never Used    History  Alcohol Use No    Family History  Problem Relation Age of Onset  . Stroke Mother   . Hypertension Mother   . Kidney disease Mother     stones  . Nephrolithiasis Brother   . Arthritis Paternal Grandmother   . Heart disease Paternal Grandmother   . Leukemia Father   . Heart disease Father   . Nephrolithiasis Father     Review of Systems: Constitutional: no fever chills diaphoresis or fatigue or change in weight.  Head and neck: no hearing loss, no epistaxis, no photophobia or visual disturbance. Respiratory: No cough, shortness of breath or wheezing. Cardiovascular: No chest pain peripheral edema, palpitations. Gastrointestinal: No abdominal distention, no abdominal pain, no change in bowel habits hematochezia or melena. Genitourinary: No dysuria, no frequency, no urgency, no nocturia. Musculoskeletal:No arthralgias, no back pain, no gait disturbance or myalgias. Neurological: No dizziness, no headaches, no numbness, no seizures, no syncope, no weakness, no tremors. Hematologic: No lymphadenopathy, no easy bruising. Psychiatric: No confusion, no hallucinations, no sleep disturbance.    Physical Exam: Filed Vitals:   08/06/13 0901  BP: 130/89  Pulse: 63   the general appearance reveals a well-developed well-nourished gentleman in no distress.The head and neck exam reveals pupils equal and reactive.  Extraocular movements are full.  There is no scleral icterus.  The mouth and pharynx are normal.  The neck is supple.  The carotids reveal no bruits.  The jugular venous pressure is normal.  The  thyroid is not enlarged.  There is no lymphadenopathy.  The chest is clear to percussion and auscultation.  There are no rales or rhonchi.  Expansion of the chest is symmetrical.  The precordium is quiet.  The first heart sound is  normal.  The second heart sound is physiologically split.  There is no murmur gallop rub or click.  There is no abnormal lift or heave.  The abdomen is soft and nontender.  The bowel sounds are normal.  The liver and spleen are not enlarged.  There are no abdominal masses.  There are no abdominal bruits.  Extremities reveal good pedal pulses.  There is no phlebitis or edema.  There is no cyanosis or clubbing.  Strength is normal and symmetrical in all extremities.  There is no lateralizing weakness.  There are no sensory deficits.  The skin is warm and dry.  There is no rash.     Assessment / Plan:  Continue same medication.  Add losartan 12.5 mg daily.  Blood work today is pending.  He is also due for a PSA  today.  His last one was in May 2013.  He has a history BPH with nocturia.  At one time his PSA had risen to 8 secondary to prostatitis. Recheck in 4 months for office visit lipid panel hepatic function panel and basal metabolic panel

## 2013-08-16 ENCOUNTER — Other Ambulatory Visit: Payer: Self-pay | Admitting: Cardiology

## 2013-08-18 ENCOUNTER — Other Ambulatory Visit: Payer: Self-pay | Admitting: *Deleted

## 2013-08-18 MED ORDER — DILTIAZEM HCL ER COATED BEADS 240 MG PO CP24: 240.0000 mg | ORAL_CAPSULE | Freq: Every morning | ORAL | Status: DC

## 2013-08-26 ENCOUNTER — Telehealth: Payer: Self-pay | Admitting: Physician Assistant

## 2013-08-26 NOTE — Telephone Encounter (Signed)
Pt called after hours to report a higher-than-normal heart rate. He typically runs in the 60's. However, HR has been running 85-100. He does not feel like he is in afib or atrial flutter - he feels this is normal rhythm. He feels well. Denies CP, SOB, nausea, vomiting, headache, lightheadedness, syncope, bleeding, fever, chills, cough, or any other symptom except maybe more tired than usual. Has been compliant with meds. Recently started on TMP/SMZ by Dr. Jeffie Pollock. Last INR check was in 07/2013. I told him that an elevated normal rhythm HR is usually in response to something else going on, but that his HR is still WNL at this time. I don't think his Bactrim is causing his elevated HR but would like to check an INR tomorrow given his Coumadin use to make sure he is not supratherapeutic thus causing any other issues. He asked if he could take any extra doses of meds tonight to bring HR down and I told him 1/4 tablet of his Toprol would be OK (takes 126m daily so that would be an extra 229m- also takes dilt but these are capsules). SBP 140s. He was advised to observe his symptoms and HR over the next few days and let usKoreanow if he does develop any symptoms or if elevated HR persists. He is in agreement with this plan. Otherwise he plans to f/u PCP on Monday as previously scheduled which I agree with. I left msg on after-hours scheduling line for our office to call pt with Coumadin check for tomorrow. Dayna Dunn PA-C

## 2013-08-27 ENCOUNTER — Ambulatory Visit (INDEPENDENT_AMBULATORY_CARE_PROVIDER_SITE_OTHER): Payer: BC Managed Care – PPO | Admitting: Pharmacist

## 2013-08-27 DIAGNOSIS — I4891 Unspecified atrial fibrillation: Secondary | ICD-10-CM

## 2013-08-27 DIAGNOSIS — I480 Paroxysmal atrial fibrillation: Secondary | ICD-10-CM

## 2013-08-27 LAB — POCT INR: INR: 3.2

## 2013-08-31 ENCOUNTER — Ambulatory Visit (INDEPENDENT_AMBULATORY_CARE_PROVIDER_SITE_OTHER): Payer: BC Managed Care – PPO | Admitting: Family Medicine

## 2013-08-31 ENCOUNTER — Encounter: Payer: Self-pay | Admitting: Family Medicine

## 2013-08-31 VITALS — BP 132/80 | HR 65 | Wt 193.0 lb

## 2013-08-31 DIAGNOSIS — F329 Major depressive disorder, single episode, unspecified: Secondary | ICD-10-CM

## 2013-08-31 DIAGNOSIS — Z23 Encounter for immunization: Secondary | ICD-10-CM | POA: Diagnosis not present

## 2013-08-31 NOTE — Progress Notes (Signed)
Pre visit review using our clinic review tool, if applicable. No additional management support is needed unless otherwise documented below in the visit note. 

## 2013-08-31 NOTE — Progress Notes (Signed)
Subjective:    Patient ID: Francis Campbell, male    DOB: 30-Apr-1946, 67 y.o.   MRN: [id number]  HPI Medical follow up He has history of atrial fibrillation, hypertension, hyperlipidemia, recurrent depression. He apparently went back into atrial fibrillation last Thursday and took 1 dose of flecainide and within a day he was back in sinus rhythm. He feels well at this time.  Recent labs per cardiologist reviewed. His lipids are up but he has not been active and poor dietary compliance at times. His depression is stable on sertraline 50 mg once daily. He is sleeping fairly well. No recent chest pains. Compliant with all medications.  He has had flu vaccine. He's had previous Pneumovax but this was over 2 years ago and would have been 24 valent pneumococcal vaccine  Past Medical History  Diagnosis Date  . Hypercholesteremia   . Hypertension   . Valvular heart disease     Mild AI, ASclerosis without AS by echo 10/2011  . TIA (transient ischemic attack)     ~2005 - had visual symptoms, was told by [name] this may have been a TIA but it's unclear  . Crohn disease     Remotely - in the 1990s  . Esophageal reflux     Remotely  . 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  [name] History of kidney stones   . Aortic regurgitation   . Asthma     only as child  . Arthritis   . Paroxysmal atrial fibrillation     Takes PRN flecainide  . Atrial flutter     a. Dx 09/2012.   Past Surgical History  Procedure Laterality Date  . Arthoscopic rotaor cuff repair    . Tonsillectomy    . Rotator cuff repair  2000  . Knee surgery  1990, 2000  . Hernia repair  2005  . Eye surgery  1950, 1960, 1964  . Lithotripsy    . Cystoscopy/retrograde/ureteroscopy/stone extraction with basket Right 02/12/2013    Procedure: CYSTOSCOPY/RETROGRADE/URETEROSCOPY/STONE EXTRACTION WITH BASKET, WITH  STENT PLACEMENT RIGHT URETERAL DILATION;  Surgeon: [name], MD;  Location: [facility];  Service:  Urology;  Laterality: Right;  . Cardioversion N/A 05/08/2013    Procedure: CARDIOVERSION;  Surgeon: Darlin Coco, MD;  Location: [facility];  Service: Cardiovascular;  Laterality: N/A;    reports that he has never smoked. He has never used smokeless tobacco. He reports that he does not drink alcohol or use illicit drugs. family history includes Arthritis in his paternal grandmother; Heart disease in his father and paternal grandmother; Hypertension in his mother; Kidney disease in his mother; Leukemia in his father; Nephrolithiasis in his brother and father; Stroke in his mother. Allergies  Allergen Reactions  . Amiodarone     Abn  thyroid  . Flomax [Tamsulosin Hcl]     ? Causes A Fib  . Floxin [Ofloxacin]     hallucinations  . Lisinopril Cough  . Tavist Allergy [Clemastine Fumarate]     A fib worse  . Chocolate Palpitations  . Dextromethorphan Palpitations  . Robitussin Cf [Pseudoephedrine-Dm-Gg] Palpitations      Review of Systems  Constitutional: Negative for fatigue and unexpected weight change.  Eyes: Negative for visual disturbance.  Respiratory: Negative for cough, chest tightness and shortness of breath.   Cardiovascular: Negative for chest pain, palpitations and leg swelling.  Neurological: Negative for dizziness, syncope, weakness, light-headedness and headaches.       Objective:  Physical Exam  Constitutional: He appears well-developed and well-nourished.  Cardiovascular: Normal rate and regular rhythm.   Pulmonary/Chest: Effort normal and breath sounds normal. No respiratory distress. He has no wheezes. He has no rales.  Musculoskeletal: He exhibits no edema.  Psychiatric: He has a normal mood and affect. His behavior is normal.          Assessment & Plan:  #1 history of recurrent depression. Currently stable. Continue sertraline 50 mg daily. Routine followup 6 months #2 health maintenance. Prevnar 13 given. Flu vaccine already given #3 history of  recurrent atrial fibrillation. Currently sinus rhythm.

## 2013-09-01 ENCOUNTER — Encounter: Payer: Self-pay | Admitting: Family Medicine

## 2013-09-01 ENCOUNTER — Telehealth: Payer: Self-pay | Admitting: Family Medicine

## 2013-09-01 NOTE — Telephone Encounter (Signed)
FYI

## 2013-09-01 NOTE — Telephone Encounter (Signed)
Patient Information:  Caller Name: Harvel  Phone: [phone]  Patient: Manley, Fason  Gender: Male  DOB: June 08, 1946  Age: 67 Years  PCP: [name] (Family Practice)  Office Follow Up:  Does the office need to follow up with this patient?: No  Instructions For The Office: N/A   Symptoms  Reason For Call & Symptoms: Pt upset with his pneumonia immunization yesterday in his arm. Pt states it hurt while he was having the injection. He hasn't had an immunization hurt like that . Right away his arm was heavy and painful. It has throbbed all night. RN tried to explain about immunization side effects to him but he tells RN that he "talked with several other Dr.s about it this am and they advised him that the nurse probably hit the axillary nerve and needs to be retrained ". Pt asked CAN RN to "pass this along to his Dr.".  Reviewed Health History In EMR: Yes  Reviewed Medications In EMR: Yes  Reviewed Allergies In EMR: Yes  Reviewed Surgeries / Procedures: Yes  Date of Onset of Symptoms: 08/31/2013  Guideline(s) Used:  Immunization Reactions  Disposition Per Guideline:   Home Care  Reason For Disposition Reached:   Immunization reactions, questions about  Advice Given:  N/A  Patient Will Follow Care Advice:  YES/home care reviewed for care of immunizations.

## 2013-09-02 NOTE — Telephone Encounter (Signed)
Pt stated that he is fine.

## 2013-09-03 ENCOUNTER — Other Ambulatory Visit: Payer: Self-pay | Admitting: Cardiology

## 2013-09-03 DIAGNOSIS — F419 Anxiety disorder, unspecified: Secondary | ICD-10-CM

## 2013-09-03 DIAGNOSIS — I119 Hypertensive heart disease without heart failure: Secondary | ICD-10-CM

## 2013-09-03 DIAGNOSIS — I480 Paroxysmal atrial fibrillation: Secondary | ICD-10-CM

## 2013-09-03 MED ORDER — LOSARTAN POTASSIUM 25 MG PO TABS: ORAL_TABLET | ORAL | Status: DC

## 2013-09-03 MED ORDER — LOVASTATIN 20 MG PO TABS: 20.0000 mg | ORAL_TABLET | Freq: Every morning | ORAL | Status: DC

## 2013-09-03 MED ORDER — METOPROLOL SUCCINATE ER 100 MG PO TB24: 100.0000 mg | ORAL_TABLET | Freq: Every day | ORAL | Status: DC

## 2013-09-03 MED ORDER — DILTIAZEM HCL ER COATED BEADS 240 MG PO CP24: 240.0000 mg | ORAL_CAPSULE | Freq: Every morning | ORAL | Status: DC

## 2013-09-03 MED ORDER — WARFARIN SODIUM 5 MG PO TABS: ORAL_TABLET | ORAL | Status: DC

## 2013-09-03 MED ORDER — ALPRAZOLAM 0.25 MG PO TABS: ORAL_TABLET | ORAL | Status: DC

## 2013-09-03 MED ORDER — FLECAINIDE ACETATE 100 MG PO TABS: ORAL_TABLET | ORAL | Status: DC

## 2013-09-03 NOTE — Telephone Encounter (Signed)
Refilled medications as requested

## 2013-09-03 NOTE — Telephone Encounter (Signed)
Refills// Insurance changes Jan 1// Requests all refills before Jan 1 to save money// Refused to speak with Medications dept//Please call back to discuss//SR

## 2013-09-07 ENCOUNTER — Telehealth: Payer: Self-pay | Admitting: *Deleted

## 2013-09-07 ENCOUNTER — Encounter: Payer: Self-pay | Admitting: Cardiology

## 2013-09-07 NOTE — Telephone Encounter (Signed)
My old Rx is Matzim LA 247m. My new Rx is Diltiazem 24HR ER 2433m Are these the same drug?   Copied from myDaytonUnfamiliar with Matzim and so was  Dr. BrMare FerrariAdvised patient to call his pharmacy and he stated he had already, they advised it was the same medication.

## 2013-10-01 ENCOUNTER — Ambulatory Visit (INDEPENDENT_AMBULATORY_CARE_PROVIDER_SITE_OTHER): Payer: BC Managed Care – PPO

## 2013-10-01 DIAGNOSIS — I4891 Unspecified atrial fibrillation: Secondary | ICD-10-CM

## 2013-10-01 DIAGNOSIS — I480 Paroxysmal atrial fibrillation: Secondary | ICD-10-CM

## 2013-10-01 LAB — POCT INR: INR: 4.3

## 2013-10-22 ENCOUNTER — Ambulatory Visit (INDEPENDENT_AMBULATORY_CARE_PROVIDER_SITE_OTHER): Payer: Medicare Other | Admitting: *Deleted

## 2013-10-22 DIAGNOSIS — Z5181 Encounter for therapeutic drug level monitoring: Secondary | ICD-10-CM | POA: Diagnosis not present

## 2013-10-22 DIAGNOSIS — I4891 Unspecified atrial fibrillation: Secondary | ICD-10-CM

## 2013-10-22 DIAGNOSIS — I480 Paroxysmal atrial fibrillation: Secondary | ICD-10-CM

## 2013-10-22 LAB — POCT INR: INR: 2.2

## 2013-11-16 ENCOUNTER — Ambulatory Visit (INDEPENDENT_AMBULATORY_CARE_PROVIDER_SITE_OTHER): Payer: Medicare Other

## 2013-11-16 DIAGNOSIS — Z5181 Encounter for therapeutic drug level monitoring: Secondary | ICD-10-CM | POA: Diagnosis not present

## 2013-11-16 DIAGNOSIS — I480 Paroxysmal atrial fibrillation: Secondary | ICD-10-CM

## 2013-11-16 DIAGNOSIS — I4891 Unspecified atrial fibrillation: Secondary | ICD-10-CM

## 2013-11-16 LAB — POCT INR: INR: 2.5

## 2013-12-14 ENCOUNTER — Telehealth: Payer: Self-pay | Admitting: Cardiology

## 2013-12-14 ENCOUNTER — Ambulatory Visit (INDEPENDENT_AMBULATORY_CARE_PROVIDER_SITE_OTHER): Payer: Medicare Other | Admitting: Pharmacist

## 2013-12-14 DIAGNOSIS — I4891 Unspecified atrial fibrillation: Secondary | ICD-10-CM

## 2013-12-14 DIAGNOSIS — Z5181 Encounter for therapeutic drug level monitoring: Secondary | ICD-10-CM

## 2013-12-14 DIAGNOSIS — I480 Paroxysmal atrial fibrillation: Secondary | ICD-10-CM

## 2013-12-14 LAB — POCT INR: INR: 1

## 2013-12-14 NOTE — Telephone Encounter (Signed)
Patient was concerned about INR check today being so low and wanted to make sure the Matzim LA that was given was not contributing. Did confirm pill size with wife. Discussed further with Sharion Dove D, no other recommendations other than what given by coumadin clinic at visit. Advised patient

## 2013-12-14 NOTE — Telephone Encounter (Signed)
New message     Questions about medications

## 2013-12-21 ENCOUNTER — Ambulatory Visit (INDEPENDENT_AMBULATORY_CARE_PROVIDER_SITE_OTHER): Payer: Medicare Other | Admitting: Pharmacist

## 2013-12-21 DIAGNOSIS — I4891 Unspecified atrial fibrillation: Secondary | ICD-10-CM | POA: Diagnosis not present

## 2013-12-21 DIAGNOSIS — Z5181 Encounter for therapeutic drug level monitoring: Secondary | ICD-10-CM

## 2013-12-21 DIAGNOSIS — I480 Paroxysmal atrial fibrillation: Secondary | ICD-10-CM

## 2013-12-21 LAB — POCT INR: INR: 2.2

## 2014-01-04 ENCOUNTER — Ambulatory Visit (INDEPENDENT_AMBULATORY_CARE_PROVIDER_SITE_OTHER): Payer: Medicare Other | Admitting: Pharmacist

## 2014-01-04 DIAGNOSIS — Z5181 Encounter for therapeutic drug level monitoring: Secondary | ICD-10-CM

## 2014-01-04 DIAGNOSIS — I4891 Unspecified atrial fibrillation: Secondary | ICD-10-CM

## 2014-01-04 DIAGNOSIS — I480 Paroxysmal atrial fibrillation: Secondary | ICD-10-CM

## 2014-01-04 LAB — POCT INR: INR: 2.3

## 2014-01-25 ENCOUNTER — Telehealth: Payer: Self-pay | Admitting: Family Medicine

## 2014-01-25 NOTE — Telephone Encounter (Signed)
Attempted to call patient back x2 at [phone] and no answer. Left VM message to call office back if assistance needed. JK/CAN

## 2014-01-26 ENCOUNTER — Telehealth: Payer: Self-pay | Admitting: Family Medicine

## 2014-01-26 NOTE — Telephone Encounter (Signed)
Pt would like to know if something can be prescribe for cough.  [facility]

## 2014-01-27 NOTE — Telephone Encounter (Signed)
He has multiple drug allergies and I am reluctant unless he knows of something he has taken in past without incident.  Has he tried [employer]?

## 2014-01-28 MED ORDER — BENZONATATE 200 MG PO CAPS: 200.0000 mg | ORAL_CAPSULE | Freq: Three times a day (TID) | ORAL | Status: DC | PRN

## 2014-01-28 NOTE — Telephone Encounter (Signed)
Rx sent to pharmacy   

## 2014-01-28 NOTE — Telephone Encounter (Signed)
Pt has not tired [employer] before. Pt stated that he is okay with trying the medication

## 2014-01-28 NOTE — Telephone Encounter (Signed)
Tessalon perles 200 mg every 8 hours prn cough.

## 2014-02-03 ENCOUNTER — Ambulatory Visit (INDEPENDENT_AMBULATORY_CARE_PROVIDER_SITE_OTHER): Payer: Medicare Other | Admitting: *Deleted

## 2014-02-03 DIAGNOSIS — Z5181 Encounter for therapeutic drug level monitoring: Secondary | ICD-10-CM | POA: Diagnosis not present

## 2014-02-03 DIAGNOSIS — I480 Paroxysmal atrial fibrillation: Secondary | ICD-10-CM

## 2014-02-03 DIAGNOSIS — I4891 Unspecified atrial fibrillation: Secondary | ICD-10-CM

## 2014-02-03 LAB — POCT INR: INR: 2.6

## 2014-02-11 DIAGNOSIS — H6120 Impacted cerumen, unspecified ear: Secondary | ICD-10-CM | POA: Diagnosis not present

## 2014-03-01 ENCOUNTER — Encounter: Payer: Self-pay | Admitting: Family Medicine

## 2014-03-01 ENCOUNTER — Ambulatory Visit (INDEPENDENT_AMBULATORY_CARE_PROVIDER_SITE_OTHER): Payer: Medicare Other | Admitting: Family Medicine

## 2014-03-01 VITALS — BP 136/68 | HR 68 | Temp 97.8°F | Wt 197.0 lb

## 2014-03-01 DIAGNOSIS — I119 Hypertensive heart disease without heart failure: Secondary | ICD-10-CM | POA: Diagnosis not present

## 2014-03-01 DIAGNOSIS — R109 Unspecified abdominal pain: Secondary | ICD-10-CM | POA: Diagnosis not present

## 2014-03-01 DIAGNOSIS — F329 Major depressive disorder, single episode, unspecified: Secondary | ICD-10-CM

## 2014-03-01 DIAGNOSIS — R1032 Left lower quadrant pain: Secondary | ICD-10-CM

## 2014-03-01 DIAGNOSIS — E7800 Pure hypercholesterolemia, unspecified: Secondary | ICD-10-CM | POA: Diagnosis not present

## 2014-03-01 NOTE — Progress Notes (Signed)
Subjective:    Patient ID: Francis Campbell, male    DOB: 1945/10/20, 68 y.o.   MRN: [id number]  HPI Medical follow up  Patient had fairly severe cough a few weeks ago. He noticed some mild discomfort left inguinal region after coughing. He's had prior history of hernia repair that region. He has not noticed any bulge. This has slowly improved over the past few weeks.  His chronic problems include history of atrial fibrillation, hyperlipidemia, Crohn's disease, history of major depression.  He's had one bout of major depression previously 2 years ago. Has been on sertraline since then. He feels very stable this time. He just retired. He has not tried tapering off sertraline in the past  Hyperlipidemia. Lipids poorly controlled last fall. He takes lovastatin. Compliant with therapy. No recent chest pains. No recent atrial fibrillation. Blood pressure well controlled with losartan diltiazem. Also takes metoprolol.  Past Medical History  Diagnosis Date  . Hypercholesteremia   . Hypertension   . Valvular heart disease     Mild AI, ASclerosis without AS by echo 10/2011  . TIA (transient ischemic attack)     ~2005 - had visual symptoms, was told by [name] this may have been a TIA but it's unclear  . Crohn disease     Remotely - in the 1990s  . Esophageal reflux     Remotely  . 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  [name] History of kidney stones   . Aortic regurgitation   . Asthma     only as child  . Arthritis   . Paroxysmal atrial fibrillation     Takes PRN flecainide  . Atrial flutter     a. Dx 09/2012.   Past Surgical History  Procedure Laterality Date  . Arthoscopic rotaor cuff repair    . Tonsillectomy    . Rotator cuff repair  2000  . Knee surgery  1990, 2000  . Hernia repair  2005  . Eye surgery  1950, 1960, 1964  . Lithotripsy    . Cystoscopy/retrograde/ureteroscopy/stone extraction with basket Right 02/12/2013    Procedure:  CYSTOSCOPY/RETROGRADE/URETEROSCOPY/STONE EXTRACTION WITH BASKET, WITH  STENT PLACEMENT RIGHT URETERAL DILATION;  Surgeon: [name], MD;  Location: [facility];  Service: Urology;  Laterality: Right;  . Cardioversion N/A 05/08/2013    Procedure: CARDIOVERSION;  Surgeon: Darlin Coco, MD;  Location: [facility] ENDOSCOPY;  Service: Cardiovascular;  Laterality: N/A;    reports that he has never smoked. He has never used smokeless tobacco. He reports that he does not drink alcohol or use illicit drugs. family history includes Arthritis in his paternal grandmother; Heart disease in his father and paternal grandmother; Hypertension in his mother; Kidney disease in his mother; Leukemia in his father; Nephrolithiasis in his brother and father; Stroke in his mother. Allergies  Allergen Reactions  . Amiodarone     Abn  thyroid  . Flomax [Tamsulosin Hcl]     ? Causes A Fib  . Floxin [Ofloxacin]     hallucinations  . Lisinopril Cough  . Tavist Allergy [Clemastine Fumarate]     A fib worse  . Chocolate Palpitations  . Dextromethorphan Palpitations  . Robitussin Cf [Pseudoephedrine-Dm-Gg] Palpitations        Review of Systems  Constitutional: Negative for fatigue and unexpected weight change.  Eyes: Negative for visual disturbance.  Respiratory: Negative for cough, chest tightness and shortness of breath.   Cardiovascular: Negative for chest pain, palpitations and  leg swelling.  Endocrine: Negative for polydipsia and polyuria.  Neurological: Negative for dizziness, syncope, weakness, light-headedness and headaches.       Objective:   Physical Exam  Constitutional: He is oriented to person, place, and time. He appears well-developed and well-nourished.  HENT:  Right Ear: External ear normal.  Left Ear: External ear normal.  Mouth/Throat: Oropharynx is clear and moist.  Eyes: Pupils are equal, round, and reactive to light.  Neck: Neck supple. No thyromegaly present.  Cardiovascular: Normal rate  and regular rhythm.   Pulmonary/Chest: Effort normal and breath sounds normal. No respiratory distress. He has no wheezes. He has no rales.  Genitourinary:  No inguinal hernia  Musculoskeletal: He exhibits no edema.  Neurological: He is alert and oriented to person, place, and time.  Psychiatric: He has a normal mood and affect. His behavior is normal.          Assessment & Plan:  #1 history of major depression. Currently stable. Taper off sertraline. Followup if he has any recurrent depression symptoms #2 hyperlipidemia. Ordered future labs for lipids #3 hypertension which is stable #4 history of atrial fibrillation. Currently sinus rhythm #5 recent left inguinal pain. Question oblique strain-related to coughing. No evidence for recurrent hernia.  Reassurance

## 2014-03-01 NOTE — Progress Notes (Signed)
Pre visit review using our clinic review tool, if applicable. No additional management support is needed unless otherwise documented below in the visit note. 

## 2014-03-01 NOTE — Patient Instructions (Signed)
Consider decrease Sertraline to one half tablet daily for 2 weeks and then discontinue.

## 2014-03-03 ENCOUNTER — Ambulatory Visit (INDEPENDENT_AMBULATORY_CARE_PROVIDER_SITE_OTHER): Payer: Medicare Other | Admitting: *Deleted

## 2014-03-03 DIAGNOSIS — Z5181 Encounter for therapeutic drug level monitoring: Secondary | ICD-10-CM | POA: Diagnosis not present

## 2014-03-03 DIAGNOSIS — I4891 Unspecified atrial fibrillation: Secondary | ICD-10-CM | POA: Diagnosis not present

## 2014-03-03 DIAGNOSIS — I480 Paroxysmal atrial fibrillation: Secondary | ICD-10-CM

## 2014-03-03 LAB — POCT INR: INR: 3.6

## 2014-03-17 ENCOUNTER — Ambulatory Visit (INDEPENDENT_AMBULATORY_CARE_PROVIDER_SITE_OTHER): Payer: Medicare Other | Admitting: *Deleted

## 2014-03-17 DIAGNOSIS — I480 Paroxysmal atrial fibrillation: Secondary | ICD-10-CM

## 2014-03-17 DIAGNOSIS — I4891 Unspecified atrial fibrillation: Secondary | ICD-10-CM

## 2014-03-17 DIAGNOSIS — Z5181 Encounter for therapeutic drug level monitoring: Secondary | ICD-10-CM

## 2014-03-17 LAB — POCT INR: INR: 2.7

## 2014-04-02 ENCOUNTER — Other Ambulatory Visit: Payer: Self-pay | Admitting: Family Medicine

## 2014-04-14 ENCOUNTER — Ambulatory Visit (INDEPENDENT_AMBULATORY_CARE_PROVIDER_SITE_OTHER): Payer: Medicare Other | Admitting: *Deleted

## 2014-04-14 DIAGNOSIS — I4891 Unspecified atrial fibrillation: Secondary | ICD-10-CM | POA: Diagnosis not present

## 2014-04-14 DIAGNOSIS — I480 Paroxysmal atrial fibrillation: Secondary | ICD-10-CM

## 2014-04-14 DIAGNOSIS — Z5181 Encounter for therapeutic drug level monitoring: Secondary | ICD-10-CM

## 2014-04-14 LAB — POCT INR: INR: 3.2

## 2014-04-18 ENCOUNTER — Other Ambulatory Visit: Payer: Self-pay | Admitting: Cardiology

## 2014-05-05 ENCOUNTER — Ambulatory Visit (INDEPENDENT_AMBULATORY_CARE_PROVIDER_SITE_OTHER): Payer: Medicare Other | Admitting: *Deleted

## 2014-05-05 DIAGNOSIS — I4891 Unspecified atrial fibrillation: Secondary | ICD-10-CM | POA: Diagnosis not present

## 2014-05-05 DIAGNOSIS — Z5181 Encounter for therapeutic drug level monitoring: Secondary | ICD-10-CM | POA: Diagnosis not present

## 2014-05-05 DIAGNOSIS — I480 Paroxysmal atrial fibrillation: Secondary | ICD-10-CM

## 2014-05-05 LAB — POCT INR: INR: 3.7

## 2014-05-13 ENCOUNTER — Other Ambulatory Visit: Payer: Self-pay | Admitting: Cardiology

## 2014-05-13 DIAGNOSIS — F419 Anxiety disorder, unspecified: Secondary | ICD-10-CM

## 2014-05-19 ENCOUNTER — Ambulatory Visit (INDEPENDENT_AMBULATORY_CARE_PROVIDER_SITE_OTHER): Payer: Medicare Other | Admitting: *Deleted

## 2014-05-19 DIAGNOSIS — I480 Paroxysmal atrial fibrillation: Secondary | ICD-10-CM

## 2014-05-19 DIAGNOSIS — Z5181 Encounter for therapeutic drug level monitoring: Secondary | ICD-10-CM

## 2014-05-19 DIAGNOSIS — I4891 Unspecified atrial fibrillation: Secondary | ICD-10-CM

## 2014-05-19 LAB — POCT INR: INR: 2.9

## 2014-06-02 ENCOUNTER — Ambulatory Visit (INDEPENDENT_AMBULATORY_CARE_PROVIDER_SITE_OTHER): Payer: Medicare Other | Admitting: Pharmacist

## 2014-06-02 DIAGNOSIS — I480 Paroxysmal atrial fibrillation: Secondary | ICD-10-CM

## 2014-06-02 DIAGNOSIS — I4891 Unspecified atrial fibrillation: Secondary | ICD-10-CM

## 2014-06-02 DIAGNOSIS — Z5181 Encounter for therapeutic drug level monitoring: Secondary | ICD-10-CM | POA: Diagnosis not present

## 2014-06-02 LAB — POCT INR: INR: 2.6

## 2014-06-08 ENCOUNTER — Encounter: Payer: Self-pay | Admitting: Family Medicine

## 2014-06-09 DIAGNOSIS — R972 Elevated prostate specific antigen [PSA]: Secondary | ICD-10-CM | POA: Diagnosis not present

## 2014-06-16 ENCOUNTER — Ambulatory Visit (INDEPENDENT_AMBULATORY_CARE_PROVIDER_SITE_OTHER): Payer: Medicare Other | Admitting: Family Medicine

## 2014-06-16 DIAGNOSIS — N411 Chronic prostatitis: Secondary | ICD-10-CM | POA: Diagnosis not present

## 2014-06-16 DIAGNOSIS — Z23 Encounter for immunization: Secondary | ICD-10-CM | POA: Diagnosis not present

## 2014-06-16 DIAGNOSIS — R972 Elevated prostate specific antigen [PSA]: Secondary | ICD-10-CM | POA: Diagnosis not present

## 2014-06-16 DIAGNOSIS — N200 Calculus of kidney: Secondary | ICD-10-CM | POA: Diagnosis not present

## 2014-06-23 ENCOUNTER — Ambulatory Visit (INDEPENDENT_AMBULATORY_CARE_PROVIDER_SITE_OTHER): Payer: Medicare Other

## 2014-06-23 ENCOUNTER — Ambulatory Visit (INDEPENDENT_AMBULATORY_CARE_PROVIDER_SITE_OTHER): Payer: Medicare Other | Admitting: Family Medicine

## 2014-06-23 VITALS — BP 114/68 | HR 64 | Temp 98.2°F | Resp 18 | Ht 71.5 in | Wt 192.0 lb

## 2014-06-23 DIAGNOSIS — M7989 Other specified soft tissue disorders: Secondary | ICD-10-CM | POA: Diagnosis not present

## 2014-06-23 DIAGNOSIS — M79662 Pain in left lower leg: Secondary | ICD-10-CM

## 2014-06-23 DIAGNOSIS — M79675 Pain in left toe(s): Secondary | ICD-10-CM

## 2014-06-23 NOTE — Patient Instructions (Signed)
It does appear to me that you have a fracture of your left small toe.  It appears to be healing. Consider taping it to the next toe for support, and wear stiff shoes to minimize bending of you toe.  If it is not doing better in the next few weeks please let me know- sooner if you start to have pain

## 2014-06-23 NOTE — Progress Notes (Signed)
[facility] [address] [place] [address] [phone]  Date:  06/23/2014   Name:  Francis Campbell   DOB:  [birth year]   MRN:  [id number]  PCP:  [name], MD    Chief Complaint: Edema   History of Present Illness:  Francis Campbell is a 68 y.o. very pleasant male patient who presents with the following:  His left pinky toe has been painful since he hit it about 10 days ago.  It was sore and bruised; he wonders if he might have fractured the toe.  It is no longer sore, but the swelling has not gone down.  He was concerned about persistent swelling and came in to be seen Otherwise he is feeling well.    Patient Active Problem List   Diagnosis Date Noted  . Encounter for therapeutic drug monitoring 10/22/2013  . BPH associated with nocturia 08/06/2013  . Atrial flutter 11/17/2012  . Anxiety 10/03/2012  . Benign hypertensive heart disease without heart failure 11/28/2011  . Regional enteritis of unspecified site 11/07/2011  . Special screening for malignant neoplasms, colon 11/07/2011  . Duodenitis 11/07/2011  . Major depression 11/06/2011  . Abdominal pain 10/22/2011  . Crohn disease 10/22/2011  . Anticoagulated on Coumadin 10/22/2011  . AF (atrial fibrillation) 10/22/2011  . Chest pain radiating to arm 09/14/2011  . Leucocytosis 09/09/2011  . Cough, persistent 07/30/2011  . Cough 07/30/2011  . Paroxysmal atrial fibrillation   . Hypercholesteremia   . Aortic insufficiency   . Systolic murmur     Past Medical History  Diagnosis Date  . Hypercholesteremia   . Hypertension   . Valvular heart disease     Mild AI, ASclerosis without AS by echo 10/2011  . TIA (transient ischemic attack)     ~2005 - had visual symptoms, was told by [name] this may have been a TIA but it's unclear  . Crohn disease     Remotely - in the 1990s  . Esophageal reflux     Remotely  . 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  [name]  History of kidney stones   . Aortic regurgitation   . Asthma     only as child  . Arthritis   . Paroxysmal atrial fibrillation     Takes PRN flecainide  . Atrial flutter     a. Dx 09/2012.  [name] Allergy     Past Surgical History  Procedure Laterality Date  . Arthoscopic rotaor cuff repair    . Tonsillectomy    . Rotator cuff repair  2000  . Knee surgery  1990, 2000  . Hernia repair  2005  . Eye surgery  1950, 1960, 1964  . Lithotripsy    . Cystoscopy/retrograde/ureteroscopy/stone extraction with basket Right 02/12/2013    Procedure: CYSTOSCOPY/RETROGRADE/URETEROSCOPY/STONE EXTRACTION WITH BASKET, WITH  STENT PLACEMENT RIGHT URETERAL DILATION;  Surgeon: [name], MD;  Location: [facility];  Service: Urology;  Laterality: Right;  . Cardioversion N/A 05/08/2013    Procedure: CARDIOVERSION;  Surgeon: Darlin Coco, MD;  Location: [facility] ENDOSCOPY;  Service: Cardiovascular;  Laterality: N/A;    History  Substance Use Topics  . Smoking status: Never Smoker   . Smokeless tobacco: Never Used  . Alcohol Use: No    Family History  Problem Relation Age of Onset  . Stroke Mother   . Hypertension Mother   . Kidney disease Mother     stones  .  Nephrolithiasis Brother   . Arthritis Paternal Grandmother   . Heart disease Paternal Grandmother   . Leukemia Father   . Heart disease Father   . Nephrolithiasis Father     Allergies  Allergen Reactions  . Amiodarone     Abn  thyroid  . Flomax [Tamsulosin Hcl]     ? Causes A Fib  . Floxin [Ofloxacin]     hallucinations  . Lisinopril Cough  . Tavist Allergy [Clemastine Fumarate]     A fib worse  . Chocolate Palpitations  . Dextromethorphan Palpitations  . Robitussin Cf [Pseudoephedrine-Dm-Gg] Palpitations    Medication list has been reviewed and updated.  Current Outpatient Prescriptions on File Prior to Visit  Medication Sig Dispense Refill  . Cholecalciferol (VITAMIN D3) 1000 UNITS CAPS Take 2 capsules by mouth daily.       [name]  diltiazem (CARDIZEM CD) 240 MG 24 hr capsule Take 1 capsule (240 mg total) by mouth every morning.  90 capsule  3  . lovastatin (MEVACOR) 20 MG tablet Take 1 tablet (20 mg total) by mouth every morning.  90 tablet  3  . metoprolol succinate (TOPROL-XL) 100 MG 24 hr tablet Take 1 tablet (100 mg total) by mouth daily. Take with or immediately following a meal.  90 tablet  3  . Multiple Vitamin (MULTIVITAMIN) tablet Take 1 tablet by mouth daily.      [name] warfarin (COUMADIN) 5 MG tablet Take as directed by anticoagulation clinic  130 tablet  1  . ALPRAZolam (XANAX) 0.25 MG tablet TAKE 1 TABLET BY MOUTH EVERY DAY AS NEEDED  90 tablet  1  . flecainide (TAMBOCOR) 100 MG tablet 3 tablets as directed by  [name]  30 tablet  3  . losartan (COZAAR) 25 MG tablet 1/2 tablet daily  45 tablet  3  . sertraline (ZOLOFT) 50 MG tablet TAKE 1 TABLET (50 MG TOTAL) BY MOUTH AT BEDTIME.  90 tablet  3  . [DISCONTINUED] digoxin (LANOXIN) 0.125 MG tablet Take 1 tablet (125 mcg total) by mouth daily.  90 tablet  3  . [DISCONTINUED] losartan-hydrochlorothiazide (HYZAAR) 100-12.5 MG per tablet       . [DISCONTINUED] pantoprazole (PROTONIX) 40 MG tablet Take 1 tablet (40 mg total) by mouth daily.  30 tablet  1   No current facility-administered medications on file prior to visit.    Review of Systems:  As per HPI- otherwise negative.   Physical Examination: Filed Vitals:   06/23/14 1419  BP: 114/68  Pulse: 64  Temp: 98.2 F (36.8 C)  Resp: 18   Filed Vitals:   06/23/14 1419  Height: 5' 11.5" (1.816 m)  Weight: 192 lb (87.091 kg)   Body mass index is 26.41 kg/(m^2). Ideal Body Weight: Weight in (lb) to have BMI = 25: 181.4  GEN: WDWN, NAD, Non-toxic, A & O x 3, looks well, here with his wife today HEENT: Atraumatic, Normocephalic. Neck supple. No masses, No LAD. Ears and Nose: No external deformity. CV: RRR, No M/G/R. No JVD. No thrill. No extra heart sounds. PULM: CTA B, no wheezes, crackles,  rhonchi. No retractions. No resp. distress. No accessory muscle use. EXTR: No c/c/e NEURO Normal gait.  PSYCH: Normally interactive. Conversant. Not depressed or anxious appearing.  Calm demeanor.  Left foot: the pinky toe is slightly swollen but non- tender, no redness, no pain with movement  [facility] reading (PRIMARY) by  Dr. Lorelei Pont. Left foot: non- displaced healing fracture at DIP joint of small toe  left foot.    EXAM: LEFT FOOT - COMPLETE 3+ VIEW  COMPARISON: None.  FINDINGS: There may be a tiny nondisplaced avulsion fracture off the lateral aspect of the base of the fifth distal phalanx. Otherwise, no evidence of an acute osseous abnormality. Mild to moderate degenerative changes at the first metatarsophalangeal joint.  IMPRESSION: 1. Possible tiny avulsion fracture off the lateral base of the fifth distal phalanx, nondisplaced. 2. 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   Assessment and Plan: Pain and swelling of toe, left - Plan: DG Foot Complete Left  Possible healing fracture of the left great toe.  At this time he does not have pain but does notice some persistent swelling.  Reassurance that his recent injury likely caused swelling.  Buddy tape and stiff shoe as needed, let me know if not better over the next couple of weeks   Signed Lamar Blinks, MD

## 2014-06-30 ENCOUNTER — Ambulatory Visit (INDEPENDENT_AMBULATORY_CARE_PROVIDER_SITE_OTHER): Payer: Medicare Other | Admitting: Pharmacist

## 2014-06-30 DIAGNOSIS — Z5181 Encounter for therapeutic drug level monitoring: Secondary | ICD-10-CM | POA: Diagnosis not present

## 2014-06-30 DIAGNOSIS — I480 Paroxysmal atrial fibrillation: Secondary | ICD-10-CM | POA: Diagnosis not present

## 2014-06-30 LAB — POCT INR: INR: 2.7

## 2014-07-13 ENCOUNTER — Ambulatory Visit (INDEPENDENT_AMBULATORY_CARE_PROVIDER_SITE_OTHER): Payer: Medicare Other | Admitting: Family Medicine

## 2014-07-13 ENCOUNTER — Ambulatory Visit (INDEPENDENT_AMBULATORY_CARE_PROVIDER_SITE_OTHER): Payer: Medicare Other

## 2014-07-13 VITALS — BP 132/70 | HR 66 | Temp 98.7°F | Resp 16 | Ht 71.5 in | Wt 196.4 lb

## 2014-07-13 DIAGNOSIS — M79675 Pain in left toe(s): Secondary | ICD-10-CM | POA: Diagnosis not present

## 2014-07-13 NOTE — Progress Notes (Signed)
Patient ID: Francis Campbell, male   DOB: 06/08/1946, 68 y.o.   MRN: [id number]  This chart was scribed for Francis Haber, MD by Ladene Artist, ED Scribe. The patient was seen in room 1. Patient's care was started at 3:24 PM.  Patient ID: Francis Campbell MRN: [id number], DOB: 1945/10/05, 68 y.o. Date of Encounter: 07/13/2014, 3:24 PM  Primary Physician: [name], MD  Chief Complaint  Patient presents with  . follow up    broken toe; left foot ; reports still having swelling and redness .    HPI: 68 y.o. year old male with history below presents with L little toe pain onset 1 month ago. Pt broke his toe 1 month ago after hitting it on the bed. Pt had XRs obtained 1 month ago that showed fracture. He still reports discoloration and swelling. He denies pain with bending. Pt has tried a shoe but has not tried buddy taping his toe despite recommendation to do so.  Pt retired 5 months ago. 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 Pt's wife used to work in orthopedic office.   Past Medical History  Diagnosis Date  . Hypercholesteremia   . Hypertension   . Valvular heart disease     Mild AI, ASclerosis without AS by echo 10/2011  . TIA (transient ischemic attack)     ~2005 - had visual symptoms, was told by [name] this may have been a TIA but it's unclear  . Crohn disease     Remotely - in the 1990s  . Esophageal reflux     Remotely  . 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  Francis Campbell History of kidney stones   . Aortic regurgitation   . Asthma     only as child  . Arthritis   . Paroxysmal atrial fibrillation     Takes PRN flecainide  . Atrial flutter     a. Dx 09/2012.  Francis Campbell Allergy      Home Meds: Prior to Admission medications   Medication Sig Start Date End Date Taking? Authorizing Provider  ALPRAZolam Duanne Moron) 0.25 MG tablet TAKE 1 TABLET BY MOUTH EVERY DAY AS NEEDED 05/13/14  Yes Darlin Coco, MD    amLODipine (NORVASC) 5 MG tablet Take 5 mg by mouth daily.   Yes Historical Provider, MD  Cholecalciferol (VITAMIN D3) 1000 UNITS CAPS Take 2 capsules by mouth daily.    Yes Historical Provider, MD  diltiazem (CARDIZEM CD) 240 MG 24 hr capsule Take 1 capsule (240 mg total) by mouth every morning. 09/03/13  Yes Darlin Coco, MD  flecainide [facility]) 100 MG tablet 3 tablets as directed by  [name] 09/03/13  Yes Darlin Coco, MD  losartan (COZAAR) 25 MG tablet 1/2 tablet daily 09/03/13  Yes Darlin Coco, MD  lovastatin (MEVACOR) 20 MG tablet Take 1 tablet (20 mg total) by mouth every morning. 09/03/13  Yes Darlin Coco, MD  metoprolol succinate (TOPROL-XL) 100 MG 24 hr tablet Take 1 tablet (100 mg total) by mouth daily. Take with or immediately following a meal. 09/03/13  Yes Darlin Coco, MD  Multiple Vitamin (MULTIVITAMIN) tablet Take 1 tablet by mouth daily.   Yes Historical Provider, MD  sertraline (ZOLOFT) 50 MG tablet TAKE 1 TABLET (50 MG TOTAL) BY MOUTH AT BEDTIME. 04/02/14  Yes [name], MD  warfarin (COUMADIN) 5 MG tablet Take as directed by anticoagulation clinic 04/19/14  Yes Darlin Coco, MD    Allergies:  Allergies  Allergen Reactions  . Amiodarone     Abn  thyroid  . Flomax [Tamsulosin Hcl]     ? Causes A Fib  . Floxin [Ofloxacin]     hallucinations  . Lisinopril Cough  . Tavist Allergy [Clemastine Fumarate]     A fib worse  . Chocolate Palpitations  . Dextromethorphan Palpitations  . Robitussin Cf [Pseudoephedrine-Dm-Gg] Palpitations    History   Social History  . Marital Status: Married    Spouse Name: N/A    Number of Children: 2  . Years of Education: N/A   Occupational History  . 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 Smoking status: Never Smoker   . Smokeless tobacco: Never Used  . Alcohol Use: No  . Drug Use: No  . Sexual Activity: Yes    Birth Control/ Protection: None   Other Topics Concern  .  Not on file   Social History Narrative   Non smoker  No ets            Review of Systems: Constitutional: negative for chills, fever, night sweats, weight changes, or fatigue  HEENT: negative for vision changes, hearing loss, congestion, rhinorrhea, ST, epistaxis, or sinus pressure Cardiovascular: negative for chest pain or palpitations Respiratory: negative for hemoptysis, wheezing, shortness of breath, or cough Abdominal: negative for abdominal pain, nausea, vomiting, diarrhea, or  Msk: +arthalgias, +joint swelling Dermatological: negative for rash Neurologic: negative for headache, dizziness, or syncope All other systems reviewed and are otherwise negative with the exception to those above and in the HPI.   Physical Exam: Triage Vitals: Blood pressure 132/70, pulse 66, temperature 98.7 F (37.1 C), temperature source Oral, resp. rate 16, height 5' 11.5" (1.816 m), weight 196 lb 6.4 oz (89.086 kg), SpO2 98.00%., Body mass index is 27.01 kg/(m^2). General: Well developed, well nourished, in no acute distress. Head: Normocephalic, atraumatic, eyes without discharge, sclera non-icteric, nares are without discharge. Bilateral auditory canals clear, TM's are without perforation, pearly grey and translucent with reflective cone of light bilaterally. Oral cavity moist, posterior pharynx without exudate, erythema, peritonsillar abscess, or post nasal drip.  Neck: Supple. No thyromegaly. Full ROM. No lymphadenopathy. Lungs: Clear bilaterally to auscultation without wheezes, rales, or rhonchi. Breathing is unlabored. Heart: RRR with S1 S2. No murmurs, rubs, or gallops appreciated. Abdomen: Soft, non-tender, non-distended with normoactive bowel sounds. No hepatomegaly. No rebound/guarding. No obvious abdominal masses. Msk:  Strength and tone normal for age. Extremities/Skin: Warm and dry. No clubbing or cyanosis. No edema. No rashes or suspicious lesions. L pinky toe is swollen, violaceous and  tender in proximal phalanx. ROM is restricted due to pain. The rest of the foot is unremarkable.  Neuro: Alert and oriented X 3. Moves all extremities spontaneously. Gait is normal. CNII-XII grossly in tact. Psych:  Responds to questions appropriately with a normal affect.   Labs:[facility] reading (PRIMARY) by  Dr. Joseph Art:  No definite fx.    ASSESSMENT AND PLAN:  68 y.o. year old male with  1. Toe pain, left   buddy tape the toe for 10 more days I personally performed the services described in this documentation, which was scribed in my presence. The recorded information has been reviewed and is accurate.  Signed, Francis Haber, MD 07/13/2014 3:24 PM

## 2014-07-30 ENCOUNTER — Ambulatory Visit (INDEPENDENT_AMBULATORY_CARE_PROVIDER_SITE_OTHER): Payer: Medicare Other | Admitting: Family Medicine

## 2014-07-30 VITALS — BP 130/80 | HR 68 | Temp 97.7°F | Wt 195.0 lb

## 2014-07-30 DIAGNOSIS — J069 Acute upper respiratory infection, unspecified: Secondary | ICD-10-CM

## 2014-07-30 DIAGNOSIS — B9789 Other viral agents as the cause of diseases classified elsewhere: Principal | ICD-10-CM

## 2014-07-30 NOTE — Patient Instructions (Signed)

## 2014-07-30 NOTE — Progress Notes (Signed)
Pre visit review using our clinic review tool, if applicable. No additional management support is needed unless otherwise documented below in the visit note. 

## 2014-07-30 NOTE — Progress Notes (Signed)
Subjective:    Patient ID: Francis Campbell, male    DOB: 1945-12-04, 68 y.o.   MRN: [id number]  HPI   Patient is seen with about 3 days history of nasal congestion and cough. No sick exposures. He denies any fevers or chills. No sore throat. He tried over-the-counter medications. Cough has been relatively mild. Nonsmoker. He is concerned about using over-the-counter medications because of history of atrial fibrillation. He previously used dextromethorphan and this triggered A. Fib.  Past Medical History  Diagnosis Date  . Hypercholesteremia   . Hypertension   . Valvular heart disease     Mild AI, ASclerosis without AS by echo 10/2011  . TIA (transient ischemic attack)     ~2005 - had visual symptoms, was told by [name] this may have been a TIA but it's unclear  . Crohn disease     Remotely - in the 1990s  . Esophageal reflux     Remotely  . 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  [name] History of kidney stones   . Aortic regurgitation   . Asthma     only as child  . Arthritis   . Paroxysmal atrial fibrillation     Takes PRN flecainide  . Atrial flutter     a. Dx 09/2012.  [name] Allergy    Past Surgical History  Procedure Laterality Date  . Arthoscopic rotaor cuff repair    . Tonsillectomy    . Rotator cuff repair  2000  . Knee surgery  1990, 2000  . Hernia repair  2005  . Eye surgery  1950, 1960, 1964  . Lithotripsy    . Cystoscopy/retrograde/ureteroscopy/stone extraction with basket Right 02/12/2013    Procedure: CYSTOSCOPY/RETROGRADE/URETEROSCOPY/STONE EXTRACTION WITH BASKET, WITH  STENT PLACEMENT RIGHT URETERAL DILATION;  Surgeon: [name], MD;  Location: [facility];  Service: Urology;  Laterality: Right;  . Cardioversion N/A 05/08/2013    Procedure: CARDIOVERSION;  Surgeon: Darlin Coco, MD;  Location: [facility] ENDOSCOPY;  Service: Cardiovascular;  Laterality: N/A;    reports that he has never smoked. He has never used smokeless tobacco. He reports  that he does not drink alcohol or use illicit drugs. family history includes Arthritis in his paternal grandmother; Heart disease in his father and paternal grandmother; Hypertension in his mother; Kidney disease in his mother; Leukemia in his father; Nephrolithiasis in his brother and father; Stroke in his mother. Allergies  Allergen Reactions  . Amiodarone     Abn  thyroid  . Flomax [Tamsulosin Hcl]     ? Causes A Fib  . Floxin [Ofloxacin]     hallucinations  . Lisinopril Cough  . Tavist Allergy [Clemastine Fumarate]     A fib worse  . Chocolate Palpitations  . Dextromethorphan Palpitations  . Robitussin Cf [Pseudoephedrine-Dm-Gg] Palpitations      Review of Systems  Constitutional: Negative for fever and chills.  HENT: Positive for congestion. Negative for sore throat.   Respiratory: Positive for cough.   Neurological: Negative for headaches.       Objective:   Physical Exam  Constitutional: He appears well-developed and well-nourished.  HENT:  Right Ear: External ear normal.  Left Ear: External ear normal.  Mouth/Throat: Oropharynx is clear and moist.  Neck: Neck supple.  Cardiovascular: Normal rate and regular rhythm.   Pulmonary/Chest: Effort normal and breath sounds normal. No respiratory distress. He has no wheezes. He has no rales.  Lymphadenopathy:    He has  no cervical adenopathy.          Assessment & Plan:  Viral URI. Reassurance. Consider plain Mucinex for cough symptoms. Follow-up for any fever or worsening symptoms.

## 2014-08-11 ENCOUNTER — Ambulatory Visit (INDEPENDENT_AMBULATORY_CARE_PROVIDER_SITE_OTHER): Payer: Medicare Other | Admitting: Pharmacist

## 2014-08-11 DIAGNOSIS — I480 Paroxysmal atrial fibrillation: Secondary | ICD-10-CM

## 2014-08-11 DIAGNOSIS — Z5181 Encounter for therapeutic drug level monitoring: Secondary | ICD-10-CM

## 2014-08-11 DIAGNOSIS — R972 Elevated prostate specific antigen [PSA]: Secondary | ICD-10-CM | POA: Diagnosis not present

## 2014-08-11 LAB — POCT INR: INR: 2.5

## 2014-08-17 DIAGNOSIS — R312 Other microscopic hematuria: Secondary | ICD-10-CM | POA: Diagnosis not present

## 2014-08-17 DIAGNOSIS — R972 Elevated prostate specific antigen [PSA]: Secondary | ICD-10-CM | POA: Diagnosis not present

## 2014-08-25 ENCOUNTER — Encounter: Payer: Self-pay | Admitting: Family Medicine

## 2014-08-27 ENCOUNTER — Ambulatory Visit: Payer: Medicare Other | Admitting: Family Medicine

## 2014-08-31 ENCOUNTER — Ambulatory Visit (INDEPENDENT_AMBULATORY_CARE_PROVIDER_SITE_OTHER): Payer: Medicare Other | Admitting: Family Medicine

## 2014-08-31 ENCOUNTER — Encounter: Payer: Self-pay | Admitting: Family Medicine

## 2014-08-31 ENCOUNTER — Ambulatory Visit: Payer: Medicare Other | Admitting: Family Medicine

## 2014-08-31 VITALS — BP 130/70 | HR 56 | Temp 97.8°F | Wt 197.0 lb

## 2014-08-31 DIAGNOSIS — K219 Gastro-esophageal reflux disease without esophagitis: Secondary | ICD-10-CM | POA: Diagnosis not present

## 2014-08-31 DIAGNOSIS — R197 Diarrhea, unspecified: Secondary | ICD-10-CM

## 2014-08-31 DIAGNOSIS — R1032 Left lower quadrant pain: Secondary | ICD-10-CM

## 2014-08-31 LAB — CBC WITH DIFFERENTIAL/PLATELET
BASOS PCT: 0.3 % (ref 0.0–3.0)
Basophils Absolute: 0 10*3/uL (ref 0.0–0.1)
EOS PCT: 1.7 % (ref 0.0–5.0)
Eosinophils Absolute: 0.1 10*3/uL (ref 0.0–0.7)
HEMATOCRIT: 50.3 % (ref 39.0–52.0)
HEMOGLOBIN: 16.3 g/dL (ref 13.0–17.0)
LYMPHS ABS: 1.5 10*3/uL (ref 0.7–4.0)
Lymphocytes Relative: 18.1 % (ref 12.0–46.0)
MCHC: 32.5 g/dL (ref 30.0–36.0)
MCV: 86.7 fl (ref 78.0–100.0)
Monocytes Absolute: 0.8 10*3/uL (ref 0.1–1.0)
Monocytes Relative: 9.2 % (ref 3.0–12.0)
Neutro Abs: 5.9 10*3/uL (ref 1.4–7.7)
Neutrophils Relative %: 70.7 % (ref 43.0–77.0)
Platelets: 200 10*3/uL (ref 150.0–400.0)
RBC: 5.8 Mil/uL (ref 4.22–5.81)
RDW: 14.7 % (ref 11.5–15.5)
WBC: 8.3 10*3/uL (ref 4.0–10.5)

## 2014-08-31 NOTE — Patient Instructions (Signed)
Abdominal Pain Many things can cause abdominal pain. Usually, abdominal pain is not caused by a disease and will improve without treatment. It can often be observed and treated at home. Your health care provider will do a physical exam and possibly order blood tests and X-rays to help determine the seriousness of your pain. However, in many cases, more time must pass before a clear cause of the pain can be found. Before that point, your health care provider may not know if you need more testing or further treatment. HOME CARE INSTRUCTIONS  Monitor your abdominal pain for any changes. The following actions may help to alleviate any discomfort you are experiencing:  Only take over-the-counter or prescription medicines as directed by your health care provider.  Do not take laxatives unless directed to do so by your health care provider.  Try a clear liquid diet (broth, tea, or water) as directed by your health care provider. Slowly move to a bland diet as tolerated. SEEK MEDICAL CARE IF:  You have unexplained abdominal pain.  You have abdominal pain associated with nausea or diarrhea.  You have pain when you urinate or have a bowel movement.  You experience abdominal pain that wakes you in the night.  You have abdominal pain that is worsened or improved by eating food.  You have abdominal pain that is worsened with eating fatty foods.  You have a fever. SEEK IMMEDIATE MEDICAL CARE IF:   Your pain does not go away within 2 hours.  You keep throwing up (vomiting).  Your pain is felt only in portions of the abdomen, such as the right side or the left lower portion of the abdomen.  You pass bloody or black tarry stools. MAKE SURE YOU:  Understand these instructions.   Will watch your condition.   Will get help right away if you are not doing well or get worse.  Document Released: 06/13/2005 Document Revised: 09/08/2013 Document Reviewed: 05/13/2013 [facility] Patient Information  2015 [place], [place]. This information is not intended to replace advice given to you by your health care provider. Make sure you discuss any questions you have with your health care provider.  Consider taking the Zantac regularly for next 2 weeks Probiotic such as Activia.

## 2014-08-31 NOTE — Progress Notes (Signed)
Subjective:    Patient ID: Francis Campbell, male    DOB: 1946-05-20, 68 y.o.   MRN: [id number]  HPI Patient seen with abdominal pain left lower quadrant for approximately 3-4 weeks. He had colonoscopy February 2013 with no history of polyps or diverticulosis changes. His pain has been very mild he rates 1 out of 10 more of a dull ache. He's had some off-and-on diarrhea. Some days he has normal stools and sometimes loose stools but has never had more than 1 or 2 per day. No bloody stools. No appetite or weight changes. Denies any fever, nausea, or vomiting. He's also had frequent belching and occasionally sour taste in his mouth. He took some Zantac for just a couple of doses which seemed to help. No urinary symptoms.  Past Medical History  Diagnosis Date  . Hypercholesteremia   . Hypertension   . Valvular heart disease     Mild AI, ASclerosis without AS by echo 10/2011  . TIA (transient ischemic attack)     ~2005 - had visual symptoms, was told by Dr. Mare Campbell this may have been a TIA but it's unclear  . Crohn disease     Remotely - in the 1990s  . Esophageal reflux     Remotely  . 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  [name] History of kidney stones   . Aortic regurgitation   . Asthma     only as child  . Arthritis   . Paroxysmal atrial fibrillation     Takes PRN flecainide  . Atrial flutter     a. Dx 09/2012.  [name] Allergy    Past Surgical History  Procedure Laterality Date  . Arthoscopic rotaor cuff repair    . Tonsillectomy    . Rotator cuff repair  2000  . Knee surgery  1990, 2000  . Hernia repair  2005  . Eye surgery  1950, 1960, 1964  . Lithotripsy    . Cystoscopy/retrograde/ureteroscopy/stone extraction with basket Right 02/12/2013    Procedure: CYSTOSCOPY/RETROGRADE/URETEROSCOPY/STONE EXTRACTION WITH BASKET, WITH  STENT PLACEMENT RIGHT URETERAL DILATION;  Surgeon: Francis So, MD;  Location: [facility];  Service: Urology;  Laterality: Right;  .  Cardioversion N/A 05/08/2013    Procedure: CARDIOVERSION;  Surgeon: Francis Coco, MD;  Location: [facility] ENDOSCOPY;  Service: Cardiovascular;  Laterality: N/A;    reports that he has never smoked. He has never used smokeless tobacco. He reports that he does not drink alcohol or use illicit drugs. family history includes Arthritis in his paternal grandmother; Heart disease in his father and paternal grandmother; Hypertension in his mother; Kidney disease in his mother; Leukemia in his father; Nephrolithiasis in his brother and father; Stroke in his mother. Allergies  Allergen Reactions  . Amiodarone     Abn  thyroid  . Flomax [Tamsulosin Hcl]     ? Causes A Fib  . Floxin [Ofloxacin]     hallucinations  . Lisinopril Cough  . Tavist Allergy [Clemastine Fumarate]     A fib worse  . Chocolate Palpitations  . Dextromethorphan Palpitations  . Robitussin Cf [Pseudoephedrine-Dm-Gg] Palpitations      Review of Systems  Constitutional: Negative for fever and chills.  Respiratory: Negative for shortness of breath.   Cardiovascular: Negative for chest pain.  Gastrointestinal: Positive for abdominal pain and diarrhea. Negative for nausea, vomiting, constipation, blood in stool and rectal pain.  Genitourinary: Negative for dysuria and hematuria.  Objective:   Physical Exam  Constitutional: He appears well-developed and well-nourished.  Cardiovascular: Normal rate and regular rhythm.   Pulmonary/Chest: Effort normal and breath sounds normal. No respiratory distress. He has no wheezes. He has no rales.  Abdominal: Soft. Bowel sounds are normal. He exhibits no distension and no mass. There is tenderness. There is no rebound and no guarding.  Minimally tender left lower quadrant to deep palpation  Musculoskeletal: He exhibits no edema.          Assessment & Plan:  Abdominal pain left lower quadrant with mild intermittent diarrhea of a few weeks duration. No recent antibiotic use.  Benign exam. No worrisome fevers or chills. No history of diverticular changes on priior colonoscopy Campbell diverticulitis would be unlikely. Active colitis seems unlikely as his stools are very intermittent and he's never had more than a couple of loose stools per day. He does describe some likely reflux symptoms. Recommend starting Zantac regularly over next couple of weeks. Consider probiotic. Check CBC. Follow-up promptly for fever or worsening symptoms.

## 2014-08-31 NOTE — Progress Notes (Signed)
Pre visit review using our clinic review tool, if applicable. No additional management support is needed unless otherwise documented below in the visit note. 

## 2014-09-19 ENCOUNTER — Other Ambulatory Visit: Payer: Self-pay | Admitting: Cardiology

## 2014-09-22 ENCOUNTER — Ambulatory Visit (INDEPENDENT_AMBULATORY_CARE_PROVIDER_SITE_OTHER): Payer: Medicare Other | Admitting: *Deleted

## 2014-09-22 DIAGNOSIS — Z5181 Encounter for therapeutic drug level monitoring: Secondary | ICD-10-CM

## 2014-09-22 DIAGNOSIS — I480 Paroxysmal atrial fibrillation: Secondary | ICD-10-CM

## 2014-09-22 LAB — POCT INR: INR: 2.3

## 2014-10-20 ENCOUNTER — Ambulatory Visit (INDEPENDENT_AMBULATORY_CARE_PROVIDER_SITE_OTHER): Payer: Medicare Other | Admitting: Cardiology

## 2014-10-20 ENCOUNTER — Ambulatory Visit (INDEPENDENT_AMBULATORY_CARE_PROVIDER_SITE_OTHER): Payer: Medicare Other | Admitting: *Deleted

## 2014-10-20 ENCOUNTER — Encounter: Payer: Self-pay | Admitting: Cardiology

## 2014-10-20 VITALS — BP 138/70 | HR 63

## 2014-10-20 DIAGNOSIS — I119 Hypertensive heart disease without heart failure: Secondary | ICD-10-CM

## 2014-10-20 DIAGNOSIS — I480 Paroxysmal atrial fibrillation: Secondary | ICD-10-CM

## 2014-10-20 DIAGNOSIS — Z5181 Encounter for therapeutic drug level monitoring: Secondary | ICD-10-CM

## 2014-10-20 DIAGNOSIS — F419 Anxiety disorder, unspecified: Secondary | ICD-10-CM | POA: Diagnosis not present

## 2014-10-20 LAB — POCT INR: INR: 2.2

## 2014-10-20 MED ORDER — DILTIAZEM HCL ER COATED BEADS 240 MG PO CP24: 240.0000 mg | ORAL_CAPSULE | Freq: Every morning | ORAL | Status: DC

## 2014-10-20 MED ORDER — LOSARTAN POTASSIUM 25 MG PO TABS: ORAL_TABLET | ORAL | Status: DC

## 2014-10-20 MED ORDER — ALPRAZOLAM 0.25 MG PO TABS
0.2500 mg | ORAL_TABLET | Freq: Every day | ORAL | Status: DC | PRN
Start: 2014-10-20 — End: 2015-03-09

## 2014-10-20 MED ORDER — AMLODIPINE BESYLATE 5 MG PO TABS: 5.0000 mg | ORAL_TABLET | Freq: Every day | ORAL | Status: DC

## 2014-10-20 MED ORDER — LOVASTATIN 20 MG PO TABS: 20.0000 mg | ORAL_TABLET | Freq: Every morning | ORAL | Status: DC

## 2014-10-20 MED ORDER — METOPROLOL SUCCINATE ER 100 MG PO TB24: ORAL_TABLET | ORAL | Status: DC

## 2014-10-20 MED ORDER — FLECAINIDE ACETATE 100 MG PO TABS: ORAL_TABLET | ORAL | Status: DC

## 2014-10-20 NOTE — Progress Notes (Signed)
Cardiology Office Note   Date:  10/20/2014   ID:  Francis Campbell, Francis Campbell [birth year], MRN [id number]  PCP:  [name], MD  Cardiologist:   Darlin Coco, MD   No chief complaint on file.     History of Present Illness: Francis Campbell is a 69 y.o. male who presents for a six-month follow-up office visit.  This pleasant 69 year old gentleman is seen for a scheduled followup office visit. The patient underwent outpatient direct current cardioversion on 05/08/13 after conservative measures failed to convert him back to sinus rhythm. He has been on long-term anticoagulation. He has a history of paroxysmal atrial fibrillation and a history of essential hypertension. He has a pill in the pocket flecainide on hand. His previous echocardiograms have shown mild aortic insufficiency. He has had atypical chest pain and has had previously normal nuclear stress tests. He had a stress Myoview on 09/27/11 which showed no ischemia and his ejection fraction was 62%. Overall since last visit he has been feeling well. He is on long-term Coumadin because of his paroxysmal atrial flutter. Since we last saw him he had another admission to the hospital with paroxysmal atrial flutter fibrillation 09/30/12- 10/04/12.  He subsequently saw [name] on 11/17/12 to discuss consideration of catheter ablation. At this point he would like to try medical therapy a while longer. He does have flecainide as a "pill in the pocket" and takes 3 100 mg tablets at the onset of his atrial fibrillation. He had successful cardioversion on 05/08/13.  He has been feeling well since last visit.  He has not had any recurrent atrial fibrillation.  He retired from work in June 2015.  He states he is under a lot less stress and this has helped his heart.  Past Medical History  Diagnosis Date  . Hypercholesteremia   . Hypertension   . Valvular heart disease     Mild AI, ASclerosis without AS by echo 10/2011  . TIA (transient  ischemic attack)     ~2005 - had visual symptoms, was told by [name] this may have been a TIA but it's unclear  . Crohn disease     Remotely - in the 1990s  . Esophageal reflux     Remotely  . 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  [name] History of kidney stones   . Aortic regurgitation   . Asthma     only as child  . Arthritis   . Paroxysmal atrial fibrillation     Takes PRN flecainide  . Atrial flutter     a. Dx 09/2012.  [name] Allergy     Past Surgical History  Procedure Laterality Date  . Arthoscopic rotaor cuff repair    . Tonsillectomy    . Rotator cuff repair  2000  . Knee surgery  1990, 2000  . Hernia repair  2005  . Eye surgery  1950, 1960, 1964  . Lithotripsy    . Cystoscopy/retrograde/ureteroscopy/stone extraction with basket Right 02/12/2013    Procedure: CYSTOSCOPY/RETROGRADE/URETEROSCOPY/STONE EXTRACTION WITH BASKET, WITH  STENT PLACEMENT RIGHT URETERAL DILATION;  Surgeon: [name], MD;  Location: [facility];  Service: Urology;  Laterality: Right;  . Cardioversion N/A 05/08/2013    Procedure: CARDIOVERSION;  Surgeon: Darlin Coco, MD;  Location: [facility] ENDOSCOPY;  Service: Cardiovascular;  Laterality: N/A;     Current Outpatient Prescriptions  Medication Sig Dispense Refill  . ALPRAZolam (XANAX) 0.25 MG tablet TAKE 1 TABLET BY MOUTH  EVERY DAY AS NEEDED 90 tablet 1  . amLODipine (NORVASC) 5 MG tablet Take 1 tablet (5 mg total) by mouth daily. 90 tablet 3  . Cholecalciferol (VITAMIN D3) 1000 UNITS CAPS Take 2 capsules by mouth daily.     [name] diltiazem (CARDIZEM CD) 240 MG 24 hr capsule Take 1 capsule (240 mg total) by mouth every morning. 90 capsule 3  . flecainide (TAMBOCOR) 100 MG tablet 3 tablets as directed by  [name] 30 tablet 3  . losartan (COZAAR) 25 MG tablet 1/2 tablet daily 45 tablet 3  . lovastatin (MEVACOR) 20 MG tablet Take 1 tablet (20 mg total) by mouth every morning. 90 tablet 3  . metoprolol succinate (TOPROL-XL) 100 MG  24 hr tablet TAKE 1 TABLET (100 MG TOTAL) BY MOUTH DAILY. TAKE WITH OR IMMEDIATELY FOLLOWING A MEAL. 90 tablet 3  . Multiple Vitamin (MULTIVITAMIN) tablet Take 1 tablet by mouth daily.    . sertraline (ZOLOFT) 50 MG tablet TAKE 1 TABLET (50 MG TOTAL) BY MOUTH AT BEDTIME. 90 tablet 3  . warfarin (COUMADIN) 5 MG tablet Take as directed by anticoagulation clinic (Patient taking differently: Take 5 mg by mouth. Take 10 mg on Sunday and Thursday and 5 mg on all other days.) 130 tablet 1  . [DISCONTINUED] digoxin (LANOXIN) 0.125 MG tablet Take 1 tablet (125 mcg total) by mouth daily. 90 tablet 3  . [DISCONTINUED] losartan-hydrochlorothiazide (HYZAAR) 100-12.5 MG per tablet     . [DISCONTINUED] pantoprazole (PROTONIX) 40 MG tablet Take 1 tablet (40 mg total) by mouth daily. 30 tablet 1   No current facility-administered medications for this visit.    Allergies:   Amiodarone; Flomax; Floxin; Lisinopril; Tavist allergy; Chocolate; Dextromethorphan; and Robitussin cf    Social History:  The patient  reports that he has never smoked. He has never used smokeless tobacco. He reports that he does not drink alcohol or use illicit drugs.   Family History:  The patient's family history includes Arthritis in his paternal grandmother; Heart disease in his father and paternal grandmother; Hypertension in his mother; Kidney disease in his mother; Leukemia in his father; Nephrolithiasis in his brother and father; Stroke in his mother.    ROS:  Please see the history of present illness.   Otherwise, review of systems are positive for none.   All other systems are reviewed and negative.    PHYSICAL EXAM: VS:  BP 138/70 mmHg  Pulse 63 , BMI There is no weight on file to calculate BMI. GEN: Well nourished, well developed, in no acute distress HEENT: normal Neck: no JVD, carotid bruits, or masses Cardiac: RRR; no  rubs, or gallops,no edema .  Soft aortic insufficiency murmur. Respiratory:  clear to auscultation  bilaterally, normal work of breathing GI: soft, nontender, nondistended, + BS MS: no deformity or atrophy Skin: warm and dry, no rash Neuro:  Strength and sensation are intact Psych: euthymic mood, full affect   EKG:  EKG is ordered today. The ekg ordered today demonstrates normal sinus rhythm.  With normal limits.   Recent Labs: 08/31/2014: Hemoglobin 16.3; Platelets 200.0    Lipid Panel    Component Value Date/Time   CHOL 232* 08/06/2013 0937   TRIG 252.0* 08/06/2013 0937   HDL 37.80* 08/06/2013 0937   CHOLHDL 6 08/06/2013 0937   VLDL 50.4* 08/06/2013 0937   LDLCALC 127* 12/25/2010 0936   LDLDIRECT 159.1 08/06/2013 0937      Wt Readings from Last 3 Encounters:  08/31/14 197 lb (  89.359 kg)  07/30/14 195 lb (88.451 kg)  07/13/14 196 lb 6.4 oz (89.086 kg)      Other studies Reviewed:    ASSESSMENT AND PLAN: 1.  Paroxysmal atrial fibrillation on chronic Coumadin. 2.  Mild aortic insufficiency 3.  Essential hypertension without heart failure 4.  Hypercholesterolemia 5.  Depression   Current medicines are reviewed at length with the patient today.  The patient does not have concerns regarding medicines.  The following changes have been made:  no change  Labs/ tests ordered today include: Lipid panel, hepatic function panel, and basal metabolic panel   Orders Placed This Encounter  Procedures  . Lipid panel  . Hepatic function panel  . Basic metabolic panel  . EKG 12-Lead     Disposition:   FU with [name] in 6 months for office visit, lipid panel, hepatic function panel, and basal metabolic panel.   Signed, Darlin Coco, MD  10/20/2014 10:33 AM    [facility] [place], [place], [place]  [address] Phone: [phone]; Fax: [phone]

## 2014-10-20 NOTE — Patient Instructions (Signed)
Your physician recommends that you continue on your current medications as directed. Please refer to the Current Medication list given to you today.  Your physician wants you to follow-up in: 6 months with fasting labs (lp/bmet/hfp)  You will receive a reminder letter in the mail two months in advance. If you don't receive a letter, please call our office to schedule the follow-up appointment.  

## 2014-11-01 ENCOUNTER — Telehealth: Payer: Self-pay | Admitting: Cardiology

## 2014-11-01 NOTE — Telephone Encounter (Signed)
Discuss Amlodipine with  [name] on Wednesday

## 2014-11-01 NOTE — Telephone Encounter (Signed)
New Msg        Pt calling, states he has questions about a prescription he was given recently.   Please return pt call.

## 2014-11-03 NOTE — Telephone Encounter (Signed)
Patient has not been taking Amlodipine even though it was on his medication list at last office visit.  [name] reviewed and will have patient continue OFF of the Amlodipine, blood pressure ok at last ov Advised patient and Amlodipine off medication list

## 2014-11-14 ENCOUNTER — Other Ambulatory Visit: Payer: Self-pay | Admitting: Cardiology

## 2014-11-17 ENCOUNTER — Telehealth: Payer: Self-pay | Admitting: Gastroenterology

## 2014-11-17 NOTE — Telephone Encounter (Signed)
For the last 2 months, patient has had pain between navel and bottom of rib cage and loss of appetite. Saw PCP and was told to f/u with GI. 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 Scheduled on 11/23/14 at 1:30 PM with Francis Bogus, PA.

## 2014-11-18 ENCOUNTER — Encounter: Payer: Self-pay | Admitting: Cardiology

## 2014-11-23 ENCOUNTER — Encounter: Payer: Self-pay | Admitting: Gastroenterology

## 2014-11-23 ENCOUNTER — Ambulatory Visit (INDEPENDENT_AMBULATORY_CARE_PROVIDER_SITE_OTHER): Payer: Medicare Other | Admitting: Gastroenterology

## 2014-11-23 VITALS — BP 124/64 | HR 68 | Ht 72.0 in | Wt 197.2 lb

## 2014-11-23 DIAGNOSIS — R1013 Epigastric pain: Secondary | ICD-10-CM

## 2014-11-23 DIAGNOSIS — R1032 Left lower quadrant pain: Secondary | ICD-10-CM

## 2014-11-23 MED ORDER — RANITIDINE HCL 150 MG PO TABS: ORAL_TABLET | ORAL | Status: DC

## 2014-11-23 NOTE — Progress Notes (Signed)
Reviewed and agree with management plan.  Davian Wollenberg T. Magaret Justo, MD FACG 

## 2014-11-23 NOTE — Patient Instructions (Signed)
We sent a prescription to [facility] [address] [facility]. 1. Zantac 150 mg   Please call us back in 4 weeks and ask for [facility]. We would like you to call us back with an update on your symptoms.

## 2014-11-23 NOTE — Progress Notes (Signed)
11/23/2014 Francis Campbell [id number] [birth year]   HISTORY OF PRESENT ILLNESS:  This is a pleasant 69 year old male who is previously known to Dr. Sharlett Iles.  He was last seen in 10/2011 at which time he had a colonoscopy and EGD.  Colonoscopy was normal with recommended repeat in 10 years from that time.  EGD revealed multiple erosions in the stomach, but was otherwise normal;  CLO test was negative for Hpylori.    He presents to our office today with LLQ abdominal discomfort and epigastric abdominal discomfort.  He says that neither of them are "painful" and are only about a 1/10 on the pain scale.  They are more just annoying feelings.  They started about the same time a couple of months ago and he notices them every day; do not keep him from sleep.  LLQ is described as a pressure.  Epigastric discomfort seems to get better with eating.  Denies any nausea, vomiting, fevers, chills, weight loss, dark or bloody stools.  Says that sometimes stools are softer than others, but overall no major change.  He has been taking a probiotic since December but has not noticed much difference.  It appears that he had similar symptoms in the past and was treated with acid reduction therapy (? Aciphex or zantac).    Has a history of inguinal hernia repair and says that he does have mesh all across his lower abdomen from several years ago.  Also apparently has a history of granulomatous colitis that has not required treatment/has been quiescent  for several years.   Past Medical History  Diagnosis Date  . Hypercholesteremia   . Hypertension   . Valvular heart disease     Mild AI, ASclerosis without AS by echo 10/2011  . TIA (transient ischemic attack)     ~2005 - had visual symptoms, was told by [name] this may have been a TIA but it's unclear  . Crohn disease     Remotely - in the 1990s  . Esophageal reflux     Remotely  . 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  [name] History  of kidney stones   . Aortic regurgitation   . Asthma     only as child  . Arthritis   . Paroxysmal atrial fibrillation     Takes PRN flecainide  . Atrial flutter     a. Dx 09/2012.  [name] Allergy    Past Surgical History  Procedure Laterality Date  . Arthoscopic rotaor cuff repair    . Tonsillectomy    . Rotator cuff repair  2000  . Knee surgery  1990, 2000  . Hernia repair  2005  . Eye surgery  1950, 1960, 1964  . Lithotripsy    . Cystoscopy/retrograde/ureteroscopy/stone extraction with basket Right 02/12/2013    Procedure: CYSTOSCOPY/RETROGRADE/URETEROSCOPY/STONE EXTRACTION WITH BASKET, WITH  STENT PLACEMENT RIGHT URETERAL DILATION;  Surgeon: [name], MD;  Location: [facility];  Service: Urology;  Laterality: Right;  . Cardioversion N/A 05/08/2013    Procedure: CARDIOVERSION;  Surgeon: Darlin Coco, MD;  Location: [facility] ENDOSCOPY;  Service: Cardiovascular;  Laterality: N/A;    reports that he has never smoked. He has never used smokeless tobacco. He reports that he does not drink alcohol or use illicit drugs. family history includes Arthritis in his paternal grandmother; Heart disease in his father and paternal grandmother; Hypertension in his mother; Kidney disease in his mother; Leukemia in his father;  Nephrolithiasis in his brother and father; Stroke in his mother. Allergies  Allergen Reactions  . Amiodarone     Abn  thyroid  . Flomax [Tamsulosin Hcl]     ? Causes A Fib  . Floxin [Ofloxacin]     hallucinations  . Lisinopril Cough  . Tavist Allergy [Clemastine Fumarate]     A fib worse  . Chocolate Palpitations  . Dextromethorphan Palpitations  . Robitussin Cf [Pseudoephedrine-Dm-Gg] Palpitations      Outpatient Encounter Prescriptions as of 11/23/2014  Medication Sig  . ALPRAZolam (XANAX) 0.25 MG tablet Take 1 tablet (0.25 mg total) by mouth daily as needed.  . Cholecalciferol (VITAMIN D3) 1000 UNITS CAPS Take 2 capsules by mouth daily.   [name] diltiazem (CARDIZEM CD) 240 MG  24 hr capsule Take 1 capsule (240 mg total) by mouth every morning.  . flecainide (TAMBOCOR) 100 MG tablet 3 tablets as directed by  [name]  . losartan (COZAAR) 25 MG tablet 1/2 tablet daily  . lovastatin (MEVACOR) 20 MG tablet Take 1 tablet (20 mg total) by mouth every morning.  . metoprolol succinate (TOPROL-XL) 100 MG 24 hr tablet TAKE 1 TABLET (100 MG TOTAL) BY MOUTH DAILY. TAKE WITH OR IMMEDIATELY FOLLOWING A MEAL.  . Multiple Vitamin (MULTIVITAMIN) tablet Take 1 tablet by mouth daily.  . sertraline (ZOLOFT) 50 MG tablet TAKE 1 TABLET (50 MG TOTAL) BY MOUTH AT BEDTIME.  [name] warfarin (COUMADIN) 5 MG tablet TAKE AS DIRECTED BY ANTICOAGULATION CLINIC     REVIEW OF SYSTEMS  : All other systems reviewed and negative except where noted in the History of Present Illness.   PHYSICAL EXAM: BP 124/64 mmHg  Pulse 68  Ht 6' (1.829 m)  Wt 197 lb 4 oz (89.472 kg)  BMI 26.75 kg/m2 General: Well developed white male in no acute distress Head: Normocephalic and atraumatic Eyes:  Sclerae anicteric, conjunctiva pink. Ears: Normal auditory acuity Lungs: Clear throughout to auscultation Heart: Regular rate and rhythm Abdomen: Soft, non-distended.  Normal bowel sounds.  Mild LLQ abdominal TTP, but exam benign. Musculoskeletal: Symmetrical with no gross deformities  Skin: No lesions on visible extremities Extremities: No edema  Neurological: Alert oriented x 4, grossly non-focal Psychological:  Alert and cooperative. Normal mood and affect  ASSESSMENT AND PLAN: -Epigastric abdominal discomfort:  EGD ok in 10/2011.  Previously had improvement of similar symptoms on acid reducing medication (? Zantac) daily.  Will have him start Zantac 150 mg daily and have him call with update of symptoms in approximately 4 weeks. -LLQ abdominal discomfort:  Does not have a history of diverticulosis on colonoscopy in 10/2011.  Does have history of inguinal hernia repair with mesh, so ? If that is causing some  irritation.  Apparently also has a remote history of IBD, possible Crohn's, but does not sound like active IBD.  No concerning features.  Will continue probiotic that was started recently for now.  Once again, he will call back in approximately 4 weeks with an update of his symptoms.  *If no improvement and symptoms persist then would order CT scan of the abdomen and pelvis with contrast.     CC:  Burchette, Alinda Sierras, MD

## 2014-12-01 ENCOUNTER — Ambulatory Visit (INDEPENDENT_AMBULATORY_CARE_PROVIDER_SITE_OTHER): Payer: Medicare Other | Admitting: *Deleted

## 2014-12-01 DIAGNOSIS — I480 Paroxysmal atrial fibrillation: Secondary | ICD-10-CM | POA: Diagnosis not present

## 2014-12-01 DIAGNOSIS — Z5181 Encounter for therapeutic drug level monitoring: Secondary | ICD-10-CM

## 2014-12-01 LAB — POCT INR: INR: 2.4

## 2014-12-20 ENCOUNTER — Encounter: Payer: Self-pay | Admitting: Cardiology

## 2014-12-23 ENCOUNTER — Telehealth: Payer: Self-pay | Admitting: Gastroenterology

## 2014-12-23 NOTE — Telephone Encounter (Signed)
Spoke with patient and he is calling with 4 week update that was requested by Alonza Bogus, PA.He is doing great. No symptoms now. Zantac is working.

## 2015-01-12 ENCOUNTER — Ambulatory Visit (INDEPENDENT_AMBULATORY_CARE_PROVIDER_SITE_OTHER): Payer: Medicare Other

## 2015-01-12 DIAGNOSIS — I480 Paroxysmal atrial fibrillation: Secondary | ICD-10-CM

## 2015-01-12 DIAGNOSIS — Z5181 Encounter for therapeutic drug level monitoring: Secondary | ICD-10-CM

## 2015-01-12 LAB — POCT INR: INR: 2.5

## 2015-02-10 DIAGNOSIS — H6122 Impacted cerumen, left ear: Secondary | ICD-10-CM | POA: Diagnosis not present

## 2015-02-16 DIAGNOSIS — R972 Elevated prostate specific antigen [PSA]: Secondary | ICD-10-CM | POA: Diagnosis not present

## 2015-02-23 ENCOUNTER — Ambulatory Visit (INDEPENDENT_AMBULATORY_CARE_PROVIDER_SITE_OTHER): Payer: Medicare Other | Admitting: *Deleted

## 2015-02-23 DIAGNOSIS — Z5181 Encounter for therapeutic drug level monitoring: Secondary | ICD-10-CM | POA: Diagnosis not present

## 2015-02-23 DIAGNOSIS — I480 Paroxysmal atrial fibrillation: Secondary | ICD-10-CM

## 2015-02-23 LAB — POCT INR: INR: 3

## 2015-03-09 ENCOUNTER — Other Ambulatory Visit: Payer: Self-pay | Admitting: Cardiology

## 2015-03-09 DIAGNOSIS — F419 Anxiety disorder, unspecified: Secondary | ICD-10-CM

## 2015-03-14 ENCOUNTER — Other Ambulatory Visit: Payer: Self-pay

## 2015-03-17 ENCOUNTER — Encounter: Payer: Self-pay | Admitting: Family Medicine

## 2015-04-06 ENCOUNTER — Ambulatory Visit (INDEPENDENT_AMBULATORY_CARE_PROVIDER_SITE_OTHER): Payer: Medicare Other | Admitting: *Deleted

## 2015-04-06 DIAGNOSIS — I480 Paroxysmal atrial fibrillation: Secondary | ICD-10-CM

## 2015-04-06 DIAGNOSIS — Z5181 Encounter for therapeutic drug level monitoring: Secondary | ICD-10-CM | POA: Diagnosis not present

## 2015-04-06 LAB — POCT INR: INR: 2.6

## 2015-04-08 ENCOUNTER — Encounter: Payer: Self-pay | Admitting: Family Medicine

## 2015-04-08 ENCOUNTER — Ambulatory Visit (INDEPENDENT_AMBULATORY_CARE_PROVIDER_SITE_OTHER): Payer: Medicare Other | Admitting: Family Medicine

## 2015-04-08 VITALS — BP 130/80 | HR 60 | Temp 97.7°F | Ht 71.5 in | Wt 198.0 lb

## 2015-04-08 DIAGNOSIS — E7800 Pure hypercholesterolemia, unspecified: Secondary | ICD-10-CM

## 2015-04-08 DIAGNOSIS — Z23 Encounter for immunization: Secondary | ICD-10-CM | POA: Diagnosis not present

## 2015-04-08 DIAGNOSIS — I482 Chronic atrial fibrillation, unspecified: Secondary | ICD-10-CM

## 2015-04-08 DIAGNOSIS — I119 Hypertensive heart disease without heart failure: Secondary | ICD-10-CM

## 2015-04-08 DIAGNOSIS — Z0000 Encounter for general adult medical examination without abnormal findings: Secondary | ICD-10-CM

## 2015-04-08 DIAGNOSIS — F331 Major depressive disorder, recurrent, moderate: Secondary | ICD-10-CM

## 2015-04-08 LAB — BASIC METABOLIC PANEL
BUN: 20 mg/dL (ref 6–23)
CALCIUM: 9.2 mg/dL (ref 8.4–10.5)
CHLORIDE: 103 meq/L (ref 96–112)
CO2: 29 mEq/L (ref 19–32)
Creatinine, Ser: 0.96 mg/dL (ref 0.40–1.50)
GFR: 82.79 mL/min (ref >60.00)
Glucose, Bld: 103 mg/dL — ABNORMAL HIGH (ref 70–99)
Potassium: 4.8 mEq/L (ref 3.5–5.1)
Sodium: 137 mEq/L (ref 135–145)

## 2015-04-08 LAB — LIPID PANEL
CHOL/HDL RATIO: 6
Cholesterol: 228 mg/dL — ABNORMAL HIGH (ref 0–200)
HDL: 40.2 mg/dL (ref >39.00)
NonHDL: 187.8
Triglycerides: 247 mg/dL — ABNORMAL HIGH (ref 0.0–149.0)
VLDL: 49.4 mg/dL — ABNORMAL HIGH (ref 0.0–40.0)

## 2015-04-08 LAB — LDL CHOLESTEROL, DIRECT: Direct LDL: 154 mg/dL

## 2015-04-08 LAB — HEPATIC FUNCTION PANEL
ALT: 22 U/L (ref 0–53)
AST: 21 U/L (ref 0–37)
Albumin: 4.2 g/dL (ref 3.5–5.2)
Alkaline Phosphatase: 73 U/L (ref 39–117)
BILIRUBIN TOTAL: 0.5 mg/dL (ref 0.2–1.2)
Bilirubin, Direct: 0 mg/dL (ref 0.0–0.3)
Total Protein: 6.9 g/dL (ref 6.0–8.3)

## 2015-04-08 NOTE — Progress Notes (Signed)
Pre visit review using our clinic review tool, if applicable. No additional management support is needed unless otherwise documented below in the visit note. 

## 2015-04-08 NOTE — Progress Notes (Signed)
Subjective:    Patient ID: Francis Campbell, male    DOB: 05-Nov-1945, 69 y.o.   MRN: [id number]  HPI Patient seen for Medicare wellness exam and medical follow-up. His chronic problems include history of atrial fibrillation, hyperlipidemia, recurrent depression, Crohn's disease.  He is followed by multiple specialists including cardiology, ENT, urology. Immunizations are reviewed. He needs Pneumovax. Other immunizations up-to-date. Colonoscopy up-to-date. Remains on Coumadin for his atrial fibrillation.  Blood pressures been well controlled with diltiazem and metoprolol as well as low dose losartan. He takes flecainide only for recurrent atrial fibrillation  Remains on Zoloft for recurrent depression and those symptoms are currently stable. No recent bleeding complications.  Past Medical History  Diagnosis Date  . Hypercholesteremia   . Hypertension   . Valvular heart disease     Mild AI, ASclerosis without AS by echo 10/2011  . TIA (transient ischemic attack)     ~2005 - had visual symptoms, was told by [name] this may have been a TIA but it's unclear  . Crohn disease     Remotely - in the 1990s  . Esophageal reflux     Remotely  . 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  [name] History of kidney stones   . Aortic regurgitation   . Asthma     only as child  . Arthritis   . Paroxysmal atrial fibrillation     Takes PRN flecainide  . Atrial flutter     a. Dx 09/2012.  [name] Allergy    Past Surgical History  Procedure Laterality Date  . Arthoscopic rotaor cuff repair    . Tonsillectomy    . Rotator cuff repair  2000  . Knee surgery  1990, 2000  . Hernia repair  2005  . Eye surgery  1950, 1960, 1964  . Lithotripsy    . Cystoscopy/retrograde/ureteroscopy/stone extraction with basket Right 02/12/2013    Procedure: CYSTOSCOPY/RETROGRADE/URETEROSCOPY/STONE EXTRACTION WITH BASKET, WITH  STENT PLACEMENT RIGHT URETERAL DILATION;  Surgeon: [name], MD;   Location: [facility];  Service: Urology;  Laterality: Right;  . Cardioversion N/A 05/08/2013    Procedure: CARDIOVERSION;  Surgeon: Darlin Coco, MD;  Location: [facility];  Service: Cardiovascular;  Laterality: N/A;    reports that he has never smoked. He has never used smokeless tobacco. He reports that he does not drink alcohol or use illicit drugs. family history includes Arthritis in his paternal grandmother; Heart disease in his father and paternal grandmother; Hypertension in his mother; Kidney disease in his mother; Leukemia in his father; Nephrolithiasis in his brother and father; Stroke in his mother. Allergies  Allergen Reactions  . Amiodarone     Abn  thyroid  . Flomax [Tamsulosin Hcl]     ? Causes A Fib  . Floxin [Ofloxacin]     hallucinations  . Lisinopril Cough  . Tavist Allergy [Clemastine Fumarate]     A fib worse  . Chocolate Palpitations  . Dextromethorphan Palpitations  . Robitussin Cf [Pseudoephedrine-Dm-Gg] Palpitations   1.  Risk factors based on Past Medical , Social, and Family history reviewed and as indicated above with no changes 2.  Limitations in physical activities None.  No recent falls. 3.  Depression/mood No active depression or anxiety issues 4.  Hearing No defiits 5.  ADLs independent in all. 6.  Cognitive function (orientation to time and place, language, writing, speech,memory) no short or long term memory issues.  Language and judgement intact. 7.  Home Safety no issues 8.  Height, weight, and visual acuity.all stable. 9.  Counseling discussed importance of weight control and regular exercise 10. Recommendation of preventive services. Yearly flu vaccine. Pneumovax recommended. 11. Labs based on risk factors lipid, hepatic, basic metabolic panel 12. Care Plan as above 13. Other Providers dr Cherylann Parr, Dr Brackbill-Cardiology and Dr Blima Dessert 14. Written schedule of screening/prevention services given to patient.      Review of  Systems  Constitutional: Negative for fever, activity change, appetite change and fatigue.  HENT: Negative for congestion, ear pain and trouble swallowing.   Eyes: Negative for pain and visual disturbance.  Respiratory: Negative for cough, shortness of breath and wheezing.   Cardiovascular: Negative for chest pain and palpitations.  Gastrointestinal: Negative for nausea, vomiting, abdominal pain, diarrhea, constipation, blood in stool, abdominal distention and rectal pain.  Genitourinary: Negative for dysuria, hematuria and testicular pain.  Musculoskeletal: Negative for joint swelling and arthralgias.  Skin: Negative for rash.  Neurological: Negative for dizziness, syncope and headaches.  Hematological: Negative for adenopathy.  Psychiatric/Behavioral: Negative for confusion and dysphoric mood.       Objective:   Physical Exam  Constitutional: He is oriented to person, place, and time. He appears well-developed and well-nourished. No distress.  HENT:  Head: Normocephalic and atraumatic.  Right Ear: External ear normal.  Left Ear: External ear normal.  Mouth/Throat: Oropharynx is clear and moist.  Eyes: Conjunctivae and EOM are normal. Pupils are equal, round, and reactive to light.  Neck: Normal range of motion. Neck supple. No thyromegaly present.  Cardiovascular: Normal rate, regular rhythm and normal heart sounds.   No murmur heard. Pulmonary/Chest: No respiratory distress. He has no wheezes. He has no rales.  Abdominal: Soft. Bowel sounds are normal. He exhibits no distension and no mass. There is no tenderness. There is no rebound and no guarding.  Genitourinary:  Per urology  Musculoskeletal: He exhibits no edema.  Lymphadenopathy:    He has no cervical adenopathy.  Neurological: He is alert and oriented to person, place, and time. He displays normal reflexes. No cranial nerve deficit.  Skin: No rash noted.  Psychiatric: He has a normal mood and affect.            Assessment & Plan:  #1 health maintenance. Pneumovax given. Continue yearly flu vaccine. PSA followed by urology #2 hyperlipidemia. Check lipid and hepatic panel  #3 hypertension stable and at goal #4 history of recurrent depression currently stable on sertraline

## 2015-04-26 ENCOUNTER — Encounter: Payer: Self-pay | Admitting: Cardiology

## 2015-04-26 ENCOUNTER — Ambulatory Visit (INDEPENDENT_AMBULATORY_CARE_PROVIDER_SITE_OTHER): Payer: Medicare Other | Admitting: Cardiology

## 2015-04-26 VITALS — BP 130/68 | HR 54

## 2015-04-26 DIAGNOSIS — E7800 Pure hypercholesterolemia, unspecified: Secondary | ICD-10-CM

## 2015-04-26 MED ORDER — LOVASTATIN 40 MG PO TABS: 40.0000 mg | ORAL_TABLET | Freq: Every morning | ORAL | Status: DC

## 2015-04-26 NOTE — Progress Notes (Signed)
Cardiology Office Note   Date:  04/26/2015   ID:  SEMIR, BRILL Dec 18, 1945, MRN [id number]  PCP:  [name], MD  Cardiologist: Darlin Coco MD  No chief complaint on file.     History of Present Illness: Francis Campbell is a 69 y.o. male who presents for a six-month follow-up office visit. This pleasant 69 year old gentleman is seen for a scheduled followup office visit. The patient underwent outpatient direct current cardioversion on 05/08/13 after conservative measures failed to convert him back to sinus rhythm. He has been on long-term anticoagulation. He has a history of paroxysmal atrial fibrillation and a history of essential hypertension. He has a pill in the pocket flecainide on hand. His previous echocardiograms have shown mild aortic insufficiency. He has had atypical chest pain and has had previously normal nuclear stress tests. He had a stress Myoview on 09/27/11 which showed no ischemia and his ejection fraction was 62%. Overall since last visit he has been feeling well. He is on long-term Coumadin because of his paroxysmal atrial flutter. Since we last saw him he had another admission to the hospital with paroxysmal atrial flutter fibrillation 09/30/12- 10/04/12.  He subsequently saw [name] on 11/17/12 to discuss consideration of catheter ablation. At this point he would like to try medical therapy a while longer. He does have flecainide as a "pill in the pocket" and takes 3 100 mg tablets at the onset of his atrial fibrillation. He had successful cardioversion on 05/08/13.  In the last 6 months he has had only one episode of atrial fibrillation.  He took the pill in the pocket therapy one day and it did not work but he repeated the next day and it converted him. The patient retired in 2015.  He feels under less stress since retirement.  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  Past Medical History  Diagnosis Date  . Hypercholesteremia   .  Hypertension   . Valvular heart disease     Mild AI, ASclerosis without AS by echo 10/2011  . TIA (transient ischemic attack)     ~2005 - had visual symptoms, was told by [name] this may have been a TIA but it's unclear  . Crohn disease     Remotely - in the 1990s  . Esophageal reflux     Remotely  . 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  [name] History of kidney stones   . Aortic regurgitation   . Asthma     only as child  . Arthritis   . Paroxysmal atrial fibrillation     Takes PRN flecainide  . Atrial flutter     a. Dx 09/2012.  [name] Allergy     Past Surgical History  Procedure Laterality Date  . Arthoscopic rotaor cuff repair    . Tonsillectomy    . Rotator cuff repair  2000  . Knee surgery  1990, 2000  . Hernia repair  2005  . Eye surgery  1950, 1960, 1964  . Lithotripsy    . Cystoscopy/retrograde/ureteroscopy/stone extraction with basket Right 02/12/2013    Procedure: CYSTOSCOPY/RETROGRADE/URETEROSCOPY/STONE EXTRACTION WITH BASKET, WITH  STENT PLACEMENT RIGHT URETERAL DILATION;  Surgeon: [name], MD;  Location: [facility];  Service: Urology;  Laterality: Right;  . Cardioversion N/A 05/08/2013    Procedure: CARDIOVERSION;  Surgeon: Darlin Coco, MD;  Location: [place];  Service: Cardiovascular;  Laterality: N/A;  Current Outpatient Prescriptions  Medication Sig Dispense Refill  . ALPRAZolam (XANAX) 0.25 MG tablet TAKE 1 TABLET BY MOUTH EVERY DAY AS NEEDED 90 tablet 1  . Cholecalciferol (VITAMIN D3) 1000 UNITS CAPS Take 2 capsules by mouth daily.     [name] diltiazem (CARDIZEM CD) 240 MG 24 hr capsule Take 1 capsule (240 mg total) by mouth every morning. 90 capsule 3  . flecainide (TAMBOCOR) 100 MG tablet 3 tablets as directed by  [name] 30 tablet 3  . losartan (COZAAR) 25 MG tablet 1/2 tablet daily 45 tablet 3  . lovastatin (MEVACOR) 40 MG tablet Take 1 tablet (40 mg total) by mouth every morning. 90 tablet 3  . metoprolol succinate  (TOPROL-XL) 100 MG 24 hr tablet TAKE 1 TABLET (100 MG TOTAL) BY MOUTH DAILY. TAKE WITH OR IMMEDIATELY FOLLOWING A MEAL. 90 tablet 3  . Multiple Vitamin (MULTIVITAMIN) tablet Take 1 tablet by mouth daily.    . ranitidine (ZANTAC) 150 MG tablet Take 1 tab daily. 30 tablet 2  . sertraline (ZOLOFT) 50 MG tablet TAKE 1 TABLET (50 MG TOTAL) BY MOUTH AT BEDTIME. 90 tablet 3  . warfarin (COUMADIN) 5 MG tablet TAKE AS DIRECTED BY ANTICOAGULATION CLINIC 130 tablet 1  . [DISCONTINUED] digoxin (LANOXIN) 0.125 MG tablet Take 1 tablet (125 mcg total) by mouth daily. 90 tablet 3  . [DISCONTINUED] losartan-hydrochlorothiazide (HYZAAR) 100-12.5 MG per tablet     . [DISCONTINUED] pantoprazole (PROTONIX) 40 MG tablet Take 1 tablet (40 mg total) by mouth daily. 30 tablet 1   No current facility-administered medications for this visit.    Allergies:   Amiodarone; Flomax; Floxin; Lisinopril; Tavist allergy; Chocolate; Dextromethorphan; and Robitussin cf    Social History:  The patient  reports that he has never smoked. He has never used smokeless tobacco. He reports that he does not drink alcohol or use illicit drugs.   Family History:  The patient's family history includes Arthritis in his paternal grandmother; Heart disease in his father and paternal grandmother; Hypertension in his mother; Kidney disease in his mother; Leukemia in his father; Nephrolithiasis in his brother and father; Stroke in his mother.    ROS:  Please see the history of present illness.   Otherwise, review of systems are positive for none.   All other systems are reviewed and negative.    PHYSICAL EXAM: VS:  BP 130/68 mmHg  Pulse 54 , BMI There is no weight on file to calculate BMI. GEN: Well nourished, well developed, in no acute distress HEENT: normal Neck: no JVD, carotid bruits, or masses Cardiac: RRR; there is a soft systolic ejection murmur at the base. Respiratory:  clear to auscultation bilaterally, normal work of  breathing GI: soft, nontender, nondistended, + BS MS: no deformity or atrophy Skin: warm and dry, no rash Neuro:  Strength and sensation are intact Psych: euthymic mood, full affect   EKG:  EKG is not ordered today.    Recent Labs: 08/31/2014: Hemoglobin 16.3; Platelets 200.0 04/08/2015: ALT 22; BUN 20; Creatinine, Ser 0.96; Potassium 4.8; Sodium 137    Lipid Panel    Component Value Date/Time   CHOL 228* 04/08/2015 1157   TRIG 247.0* 04/08/2015 1157   HDL 40.20 04/08/2015 1157   CHOLHDL 6 04/08/2015 1157   VLDL 49.4* 04/08/2015 1157   LDLCALC 127* 12/25/2010 0936   LDLDIRECT 154.0 04/08/2015 1157      Wt Readings from Last 3 Encounters:  04/08/15 198 lb (89.812 kg)  11/23/14 197 lb 4  oz (89.472 kg)  08/31/14 197 lb (89.359 kg)       ASSESSMENT AND PLAN:  1. Paroxysmal atrial fibrillation on chronic Coumadin. 2. Mild aortic insufficiency 3. Essential hypertension without heart failure 4. Hypercholesterolemia.  We reviewed his recent lab work dated 04/08/15.  His LDL is still too high.  We will have him increase his lovastatin up to 40 mg daily.  He also needs to work harder on exercise and weight loss.  He would like to lose weight down to 187 pounds by next visit. 5. Depression, improved   Current medicines are reviewed at length with the patient today.  The patient does not have concerns regarding medicines.  The following changes have been made:  Increase lovastatin up to 40 mg daily  Labs/ tests ordered today include:   Orders Placed This Encounter  Procedures  . Lipid panel  . Hepatic function panel  . Basic metabolic panel    Disposition: Recheck in 6 months for office visit EKG lipid panel hepatic function panel and basal metabolic panel.  Francis Spare MD 04/26/2015 5:51 PM    [address], [place], [place]  [address] Phone: [phone]; Fax: [phone]

## 2015-04-26 NOTE — Patient Instructions (Signed)
Medication Instructions:  INCREASE YOUR LOVASTATIN TO 40 MG DAILY   Labwork: NONE  Testing/Procedures: NONE  Follow-Up: Your physician wants you to follow-up in: 6 months with fasting labs (lp/bmet/hfp) AND EKG  You will receive a reminder letter in the mail two months in advance. If you don't receive a letter, please call our office to schedule the follow-up appointment.  Any Other Special Instructions Will Be Listed Below (If Applicable).

## 2015-05-05 ENCOUNTER — Encounter: Payer: Self-pay | Admitting: Cardiology

## 2015-05-06 ENCOUNTER — Other Ambulatory Visit: Payer: Self-pay | Admitting: *Deleted

## 2015-05-06 ENCOUNTER — Telehealth: Payer: Self-pay | Admitting: *Deleted

## 2015-05-06 NOTE — Telephone Encounter (Signed)
[name] would like patient to decrease to 20 mg daily Message sent in my chart to patient   From  [place]   To  Darlin Coco, MD   Sent  05/05/2015 2:16 PM      Today, Thursday, I received a letter from [facility] recommending I contact you concerning a couple of prescriptions I am taking. They are concerned about taking both lovastatin (71m) and diltiazem CD (240 mg).    Their letter indicated 'these medicines may cause problems or not work as well when you take them together

## 2015-05-06 NOTE — Addendum Note (Signed)
Addended by: Alvina Filbert B on: 05/06/2015 04:50 PM   Modules accepted: Orders, Medications

## 2015-05-11 ENCOUNTER — Encounter: Payer: Self-pay | Admitting: Family Medicine

## 2015-05-16 DIAGNOSIS — M67442 Ganglion, left hand: Secondary | ICD-10-CM | POA: Diagnosis not present

## 2015-05-16 DIAGNOSIS — M79645 Pain in left finger(s): Secondary | ICD-10-CM | POA: Diagnosis not present

## 2015-05-18 ENCOUNTER — Ambulatory Visit (INDEPENDENT_AMBULATORY_CARE_PROVIDER_SITE_OTHER): Payer: Medicare Other | Admitting: *Deleted

## 2015-05-18 DIAGNOSIS — I480 Paroxysmal atrial fibrillation: Secondary | ICD-10-CM | POA: Diagnosis not present

## 2015-05-18 DIAGNOSIS — Z5181 Encounter for therapeutic drug level monitoring: Secondary | ICD-10-CM

## 2015-05-18 LAB — POCT INR: INR: 2.5

## 2015-05-26 ENCOUNTER — Ambulatory Visit (INDEPENDENT_AMBULATORY_CARE_PROVIDER_SITE_OTHER): Payer: Medicare Other

## 2015-05-26 ENCOUNTER — Other Ambulatory Visit: Payer: Self-pay | Admitting: *Deleted

## 2015-05-26 DIAGNOSIS — Z23 Encounter for immunization: Secondary | ICD-10-CM

## 2015-05-26 MED ORDER — SERTRALINE HCL 50 MG PO TABS: ORAL_TABLET | ORAL | Status: DC

## 2015-05-26 NOTE — Telephone Encounter (Signed)
Rx done. 

## 2015-05-27 ENCOUNTER — Other Ambulatory Visit: Payer: Self-pay

## 2015-05-27 MED ORDER — DILTIAZEM HCL ER COATED BEADS 240 MG PO CP24: 240.0000 mg | ORAL_CAPSULE | Freq: Every morning | ORAL | Status: DC

## 2015-05-27 MED ORDER — METOPROLOL SUCCINATE ER 100 MG PO TB24: ORAL_TABLET | ORAL | Status: DC

## 2015-05-27 MED ORDER — LOSARTAN POTASSIUM 25 MG PO TABS: ORAL_TABLET | ORAL | Status: DC

## 2015-05-27 MED ORDER — LOVASTATIN 20 MG PO TABS: 20.0000 mg | ORAL_TABLET | Freq: Every day | ORAL | Status: DC

## 2015-05-27 NOTE — Telephone Encounter (Signed)
Earvin Hansen at 05/06/2015 1:47 PM     Status: Signed       Expand All Collapse All   [name] would like patient to decrease to 20 mg daily Message sent in my chart to patient   From  [place]   To  Darlin Coco, MD   Sent  05/05/2015 2:16 PM      Today, Thursday, I received a letter from [facility] recommending I contact you concerning a couple of prescriptions I am taking. They are concerned about taking both lovastatin (48m) and diltiazem CD (240 mg).    Their letter indicated 'these medicines may cause problems or not work as well when you take them together         [name] MD at 04/26/2015 5:49 PM  diltiazem (CARDIZEM CD) 240 MG 24 hr capsuleTake 1 capsule (240 mg total) by mouth every morning losartan (COZAAR) 25 MG tablet 1/2 tablet daily

## 2015-05-27 NOTE — Telephone Encounter (Signed)
Francis Coco, MD at 04/26/2015 5:49 PM  metoprolol succinate (TOPROL-XL) 100 MG 24 hr tabletTAKE 1 TABLET (100 MG TOTAL) BY MOUTH DAILY. TAKE WITH OR IMMEDIATELY  Current medicines are reviewed at length with the patient today. The patient does not have concerns regarding medicines.  The following changes have been made: Increase lovastatin up to 40 mg daily

## 2015-06-07 ENCOUNTER — Other Ambulatory Visit: Payer: Self-pay | Admitting: Cardiology

## 2015-06-17 ENCOUNTER — Other Ambulatory Visit: Payer: Self-pay | Admitting: Cardiology

## 2015-06-17 DIAGNOSIS — F419 Anxiety disorder, unspecified: Secondary | ICD-10-CM

## 2015-06-20 ENCOUNTER — Other Ambulatory Visit: Payer: Self-pay | Admitting: Cardiology

## 2015-06-20 NOTE — Telephone Encounter (Signed)
Called Rx to [facility] again as they did not receive last week

## 2015-06-21 DIAGNOSIS — Z0000 Encounter for general adult medical examination without abnormal findings: Secondary | ICD-10-CM | POA: Diagnosis not present

## 2015-06-22 ENCOUNTER — Other Ambulatory Visit: Payer: Self-pay | Admitting: Urology

## 2015-06-22 ENCOUNTER — Telehealth: Payer: Self-pay | Admitting: Cardiology

## 2015-06-22 DIAGNOSIS — N411 Chronic prostatitis: Secondary | ICD-10-CM | POA: Diagnosis not present

## 2015-06-22 DIAGNOSIS — R972 Elevated prostate specific antigen [PSA]: Secondary | ICD-10-CM | POA: Diagnosis not present

## 2015-06-22 DIAGNOSIS — N200 Calculus of kidney: Secondary | ICD-10-CM | POA: Diagnosis not present

## 2015-06-22 NOTE — Telephone Encounter (Signed)
Will forward to  [name] for review

## 2015-06-22 NOTE — Telephone Encounter (Signed)
New message      Request for surgical clearance:  1. What type of surgery is being performed? lithotripsy  When is this surgery scheduled?06-27-15 Are there any medications that need to be held prior to surgery and how long? Stop coumadin 2. Name of physician performing surgery?  Dr Jeffie Pollock  3. What is your office phone and fax number? Fax [phone]

## 2015-06-23 ENCOUNTER — Encounter (HOSPITAL_COMMUNITY): Payer: Self-pay | Admitting: *Deleted

## 2015-06-23 NOTE — Telephone Encounter (Signed)
Faxed Dr Ralene Muskrat office, confirmation received

## 2015-06-23 NOTE — Telephone Encounter (Signed)
Okay to stop Coumadin 5 days before procedure.

## 2015-06-24 NOTE — H&P (Signed)
Active Problems Problems  1. Anticoagulant long-term use (Z79.01) 2. Bilateral kidney stones (N20.0) 3. Bladder neck obstruction (N32.0) 4. 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 History of Elevated prostate specific antigen (PSA) (R97.20) 6. 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 Microscopic hematuria (R31.29) 8. Nephrolithiasis (N20.0) 9. Renal cyst, acquired (N28.1)  History of Present Illness Francis Campbell returns today with the onset yesterday of pain in the left groin that was 7/10 in severity. It has moved to the left flank. He has had no nausea but he has anorexia.  He has no hematuria or voiding symptoms.  He had a KUB today that shows a 7x63m LUPJ stone that was previously in the LLicking  He was to return for annual f/u tomorrow and his PSA prior to this visit is down to 1.64. He has a prior history of an elevated PSA with a history of prostatitis.  He is on warfarin for a-fib.   Past Medical History Problems  1. History of Atrial fibrillation (I48.91) 2. History of Calculus of ureter (N20.1) 3. History of Elevated prostate specific antigen (PSA) (R97.20) 4. History of Gross hematuria (R31.0) 5. History of constipation (Z87.19) 6. History of hematuria (Z87.448) 7. History of hypertension (Z86.79) 8. History of Hydronephrosis, left (N13.30) 9. History of Murmur (R01.1)  Surgical History Problems  1. History of Cystoscopy With Insertion Of Ureteral Stent Right 2. History of Cystoscopy With Ureteroscopy With Removal Of Calculus 3. History of Cystourethroscopy With Treatment Of Ureteral Stricture 4. History of Inguinal Hernia Repair 5. History of Lithotripsy 6. History of Rotator Cuff Repair  Current Meds 1. ALPRAZolam 0.25 MG Oral Tablet;  Therapy: 05Apr2012 to Recorded 2. AmLODIPine Besylate 5 MG Oral Tablet;  Therapy: 21Dec2012 to Recorded 3. Centrum TABS;  Therapy: (Recorded:02Nov2011) to Recorded 4. Diltiazem HCl ER Coated Beads 240 MG Oral Capsule Extended  Release 24 Hour;  Therapy: 31Oct2015 to Recorded 5. Flecainide Acetate 100 MG Oral Tablet;  Therapy: 22Jan2014 to Recorded 6. Furosemide 20 MG Oral Tablet;  Therapy: (Recorded:01Dec2015) to Recorded 7. Hydrocodone-Acetaminophen 5-325 MG Oral Tablet; TAKE 1 TO 2 TABLETS BY MOUTH  EVERY 6 HOURS AS NEEDED FOR PAIN;  Therapy: [id number] (Evaluate:08Oct2016); Last Rx:05Oct2016 Ordered 8. Klor-Con TBCR;  Therapy: (Recorded:01Dec2015) to Recorded 9. Losartan Potassium 25 MG Oral Tablet;  Therapy: 31Oct2015 to Recorded 10. Lovastatin 20 MG Oral Tablet;   Therapy: 12Aug2013 to Recorded 11. Metoprolol Succinate ER 100 MG Oral Tablet Extended Release 24 Hour;   Therapy: (Recorded:18Mar2013) to Recorded 12. Promethazine HCl - 25 MG Oral Tablet; TAKE 1 TABLET Every  6 hours PRN;   Therapy: 05Oct2016 to (Last Rx:05Oct2016) Ordered 13. Sertraline HCl - 50 MG Oral Tablet;   Therapy: [id number] Recorded 14. Vitamin D3 1000 UNIT Oral Capsule;   Therapy: (Recorded:15Feb2011) to Recorded 15. Warfarin Sodium 5 MG Oral Tablet;   Therapy: (Recorded:15Feb2011) to Recorded  Allergies Medication  1. Flomax CP24 2. Floxin TABS  Family History Problems  1. Family history of Nephrolithiasis : Father 2. Family history of Nephrolithiasis : Brother  Social History Problems  1. Denied: Alcohol Use 2. Denied: Caffeine Use 3. Marital History - Currently Married 4. 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   Review of Systems  Cardiovascular: no chest pain and no palpitations.  Respiratory: no shortness of breath.    Vitals Vital Signs [Data Includes: Last 1 Day]  Recorded: 05Oct2016 10:48AM  Weight: 198 lb  BMI  Calculated: 25.42 BSA Calculated: 2.16 Blood Pressure: 121 / 70 Temperature: 97.9 F Heart Rate: 56  Physical Exam Constitutional: Well nourished and well developed . No acute distress.  Abdomen:  The abdomen is soft and nontender. No masses are palpated. No CVA tenderness. No hernias are palpable. No hepatosplenomegaly noted.  Rectal: Rectal exam demonstrates normal sphincter tone, no tenderness and no masses. Estimated prostate size is 2+. The prostate has no nodularity. The left seminal vesicle is nonpalpable. The right seminal vesicle is nonpalpable. The perineum is normal on inspection.  Genitourinary: Examination of the penis demonstrates no discharge, no masses, no lesions and a normal meatus. The scrotum is without lesions. The right epididymis is palpably normal and non-tender. The left epididymis is palpably normal and non-tender. The right testis is non-tender and without masses. The left testis is non-tender and without masses.  Lymphatics: The femoral and inguinal nodes are not enlarged or tender.    Results/Data Urine [Data Includes: Last 1 Day]   05Oct2016  COLOR YELLOW   APPEARANCE CLEAR   SPECIFIC GRAVITY 1.015   pH 7.0   GLUCOSE NEGATIVE   BILIRUBIN NEGATIVE   KETONE NEGATIVE   BLOOD 1+   PROTEIN NEGATIVE   NITRITE NEGATIVE   LEUKOCYTE ESTERASE NEGATIVE   SQUAMOUS EPITHELIAL/HPF NONE SEEN HPF  WBC NONE SEEN WBC/HPF  RBC 10-20 RBC/HPF  BACTERIA NONE SEEN HPF  CRYSTALS NONE SEEN HPF  CASTS NONE SEEN LPF  Yeast NONE SEEN HPF   The following images/tracing/specimen were independently visualized:  KUB today shows a 7x34m LUPJ stone with about 5 additional renal stones. He has no right renal stones or ureteral stones. He has hernia tacks in the pelvis. There are no other significant abnormalities. There is a stable calcification in the left upper pelvis which is not a stone.  The following clinical lab reports were reviewed:  UA reviewed.    Assessment Assessed  1. Left nephrolithiasis (N20.0) 2. History of Elevated prostate specific antigen (PSA) (R97.20) 3. 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    Plan Health Maintenance  1. UA With REFLEX; [Do Not Release]; Status:Resulted - Requires Verification;   Done:  [id number]:22AM Left nephrolithiasis  2. Follow-up Schedule Surgery Office  Follow-up  Status: Hold For - Appointment   Requested for: 05Oct2016  I am going to get him set up for left ESWL and reviewed the risks of bleeding, infection, injury to the kidney and adjacent structures, need for secondary procedures for obstructing fragments, thrombotic events, cardiac arrhythmia and sedation complications.  I also discussed ureteroscopy.   He was given scripts for hydrocodone and phenergan.   He will stop the warfarin and we will get cardiac clearance.   After a thorough review of the management options for the patient's condition the patient  elected to proceed with surgical therapy as noted above. We have discussed the potential benefits and risks of the procedure, side effects of the proposed treatment, the likelihood of the patient achieving the goals of the procedure, and any potential problems that might occur during the procedure or recuperation. Informed consent has been obtained.

## 2015-06-27 ENCOUNTER — Ambulatory Visit (HOSPITAL_COMMUNITY)
Admission: RE | Admit: 2015-06-27 | Discharge: 2015-06-27 | Disposition: A | Discharge status: Home / Self Care | Payer: Medicare Other | Source: Ambulatory Visit | Attending: Urology | Admitting: Urology

## 2015-06-27 ENCOUNTER — Encounter (HOSPITAL_COMMUNITY)
Admission: RE | Disposition: A | Discharge status: Home / Self Care | Payer: Self-pay | Source: Ambulatory Visit | Attending: Urology

## 2015-06-27 ENCOUNTER — Encounter (HOSPITAL_COMMUNITY): Payer: Self-pay

## 2015-06-27 ENCOUNTER — Ambulatory Visit (HOSPITAL_COMMUNITY): Payer: Medicare Other

## 2015-06-27 DIAGNOSIS — Z7901 Long term (current) use of anticoagulants: Secondary | ICD-10-CM | POA: Insufficient documentation

## 2015-06-27 DIAGNOSIS — Z79899 Other long term (current) drug therapy: Secondary | ICD-10-CM | POA: Diagnosis not present

## 2015-06-27 DIAGNOSIS — N411 Chronic prostatitis: Secondary | ICD-10-CM | POA: Insufficient documentation

## 2015-06-27 DIAGNOSIS — I4891 Unspecified atrial fibrillation: Secondary | ICD-10-CM | POA: Insufficient documentation

## 2015-06-27 DIAGNOSIS — Z79891 Long term (current) use of opiate analgesic: Secondary | ICD-10-CM | POA: Insufficient documentation

## 2015-06-27 DIAGNOSIS — Z841 Family history of disorders of kidney and ureter: Secondary | ICD-10-CM | POA: Diagnosis not present

## 2015-06-27 DIAGNOSIS — N201 Calculus of ureter: Secondary | ICD-10-CM

## 2015-06-27 DIAGNOSIS — I10 Essential (primary) hypertension: Secondary | ICD-10-CM | POA: Insufficient documentation

## 2015-06-27 DIAGNOSIS — N202 Calculus of kidney with calculus of ureter: Secondary | ICD-10-CM | POA: Insufficient documentation

## 2015-06-27 LAB — PROTIME-INR
INR: 1.12 (ref 0.00–1.49)
PROTHROMBIN TIME: 14.6 s (ref 11.6–15.2)

## 2015-06-27 LAB — APTT: APTT: 28 s (ref 24–37)

## 2015-06-27 SURGERY — LITHOTRIPSY, ESWL
Anesthesia: LOCAL | Laterality: Left

## 2015-06-27 MED ORDER — SODIUM CHLORIDE 0.9 % IV SOLN
INTRAVENOUS | Status: DC
  Administered 2015-06-27: 11:00:00 via INTRAVENOUS

## 2015-06-27 MED ORDER — DIAZEPAM 5 MG PO TABS
10.0000 mg | ORAL_TABLET | ORAL | Status: AC
  Administered 2015-06-27: 10 mg via ORAL
  Filled 2015-06-27: qty 2

## 2015-06-27 MED ORDER — DIPHENHYDRAMINE HCL 25 MG PO CAPS
25.0000 mg | ORAL_CAPSULE | ORAL | Status: AC
  Administered 2015-06-27: 25 mg via ORAL
  Filled 2015-06-27: qty 1

## 2015-06-27 MED ORDER — CIPROFLOXACIN HCL 500 MG PO TABS
500.0000 mg | ORAL_TABLET | ORAL | Status: AC
  Administered 2015-06-27: 500 mg via ORAL
  Filled 2015-06-27: qty 1

## 2015-06-27 MED ORDER — HYDROCODONE-ACETAMINOPHEN 5-325 MG PO TABS
1.0000 | ORAL_TABLET | Freq: Once | ORAL | Status: AC
  Administered 2015-06-27: 1 via ORAL
  Filled 2015-06-27: qty 1

## 2015-06-27 NOTE — Interval H&P Note (Signed)
History and Physical Interval Note:  06/27/2015 7:24 AM  Francis Campbell JR  has presented today for surgery, with the diagnosis of LEFT URETERAL PELVIC JUNCTION STONE  The various methods of treatment have been discussed with the patient and family. After consideration of risks, benefits and other options for treatment, the patient has consented to  Procedure(s): LEFT EXTRACORPOREAL SHOCK WAVE LITHOTRIPSY (ESWL) (Left) as a surgical intervention .  The patient's history has been reviewed, patient examined, no change in status, stable for surgery.  I have reviewed the patient's chart and labs.  Questions were answered to the patient's satisfaction.     Zeinab Rodwell A

## 2015-06-29 DIAGNOSIS — N1330 Unspecified hydronephrosis: Secondary | ICD-10-CM | POA: Diagnosis not present

## 2015-06-29 DIAGNOSIS — N201 Calculus of ureter: Secondary | ICD-10-CM | POA: Diagnosis not present

## 2015-06-29 DIAGNOSIS — N200 Calculus of kidney: Secondary | ICD-10-CM | POA: Diagnosis not present

## 2015-07-01 ENCOUNTER — Ambulatory Visit: Payer: Medicare Other | Admitting: Gastroenterology

## 2015-07-04 DIAGNOSIS — M25561 Pain in right knee: Secondary | ICD-10-CM | POA: Diagnosis not present

## 2015-07-06 ENCOUNTER — Ambulatory Visit (INDEPENDENT_AMBULATORY_CARE_PROVIDER_SITE_OTHER): Payer: Medicare Other | Admitting: Pharmacist

## 2015-07-06 ENCOUNTER — Ambulatory Visit (INDEPENDENT_AMBULATORY_CARE_PROVIDER_SITE_OTHER): Payer: Medicare Other | Admitting: Gastroenterology

## 2015-07-06 ENCOUNTER — Encounter: Payer: Self-pay | Admitting: Gastroenterology

## 2015-07-06 ENCOUNTER — Other Ambulatory Visit (INDEPENDENT_AMBULATORY_CARE_PROVIDER_SITE_OTHER): Payer: Medicare Other

## 2015-07-06 VITALS — BP 130/80 | HR 64 | Ht 72.0 in | Wt 203.3 lb

## 2015-07-06 DIAGNOSIS — R109 Unspecified abdominal pain: Secondary | ICD-10-CM

## 2015-07-06 DIAGNOSIS — Z5181 Encounter for therapeutic drug level monitoring: Secondary | ICD-10-CM

## 2015-07-06 DIAGNOSIS — I480 Paroxysmal atrial fibrillation: Secondary | ICD-10-CM | POA: Diagnosis not present

## 2015-07-06 LAB — BASIC METABOLIC PANEL
BUN: 26 mg/dL — AB (ref 6–23)
CALCIUM: 9 mg/dL (ref 8.4–10.5)
CO2: 23 mEq/L (ref 19–32)
CREATININE: 1.02 mg/dL (ref 0.40–1.50)
Chloride: 107 mEq/L (ref 96–112)
GFR: 77.14 mL/min (ref >60.00)
Glucose, Bld: 167 mg/dL — ABNORMAL HIGH (ref 70–99)
Potassium: 3.5 mEq/L (ref 3.5–5.1)
Sodium: 140 mEq/L (ref 135–145)

## 2015-07-06 LAB — POCT INR: INR: 2

## 2015-07-06 NOTE — Patient Instructions (Addendum)
Your physician has requested that you go to the basement for the following lab work before leaving today: BMET    You have been scheduled for a CT scan of the abdomen and pelvis at [facility] ([address]---this is in the same building as Press [facility]).   You are scheduled on 07/14/2015 at 2:00pm. You should arrive 15 minutes prior to your appointment time for registration. Please follow the written instructions below on the day of your exam:  WARNING: IF YOU ARE ALLERGIC TO IODINE/X-RAY DYE, PLEASE NOTIFY RADIOLOGY IMMEDIATELY AT [phone]! YOU WILL BE GIVEN A 13 HOUR PREMEDICATION PREP.  1) Do not eat or drink anything after 11:00am (4 hours prior to your test) 2) You have been given 2 bottles of oral contrast to drink. The solution may taste               better if refrigerated, but do NOT add ice or any other liquid to this solution. Shake             well before drinking.    Drink 1 bottle of contrast @ 12:00pm (2 hours prior to your exam)  Drink 1 bottle of contrast @ 1:00pm (1 hour prior to your exam)  You may take any medications as prescribed with a small amount of water except for the following: Metformin, Glucophage, Glucovance, Avandamet, Riomet, Fortamet, Actoplus Met, Janumet, Glumetza or Metaglip. The above medications must be held the day of the exam AND 48 hours after the exam.  The purpose of you drinking the oral contrast is to aid in the visualization of your intestinal tract. The contrast solution may cause some diarrhea. Before your exam is started, you will be given a small amount of fluid to drink. Depending on your individual set of symptoms, you may also receive an intravenous injection of x-ray contrast/dye. Plan on being at [facility] for 30 minutes or long, depending on the type of exam you are having performed.  This test typically takes 30-45 minutes to complete.  If you have any questions regarding your exam or if you need to  reschedule, you may call the CT department at [phone] between the hours of 8:00 am and 5:00 pm, Monday-Friday.  ________________________________________________________________________

## 2015-07-06 NOTE — Progress Notes (Signed)
Review of pertinent gastrointestinal problems: 1. Routine risk for colon cancer; 10/2011 Dr. Sharlett Campbell colonoscopy was normal with recommended repeat in 10 years from that time.  2. Dyspepsia; 10/2011 Dr. Sharlett Campbell EGD revealed multiple erosions in the stomach, but was otherwise normal; CLO test was negative for Hpylori. Symptoms responded to H2 blocker.  HPI: This is a   very pleasant 69 year old man whom I am meeting for the first time today  He saw Francis Campbell here in our office 6 months ago.   Chief complaint is persistent left lower quadrant pain  He has a constant pressure in his LLQ, never serious, a discomfort that is somewhat positions.  abd hernia with mesh 15 years ago.  Saw a urologist recently found a kidney stone on that side, went for lithotripsy  1-2 years of discomfort.  Can be present for hours.  He has had no fevers or chills   Past Medical History  Diagnosis Date  . Hypercholesteremia   . Hypertension   . Valvular heart disease     Mild AI, ASclerosis without AS by echo 10/2011  . TIA (transient ischemic attack)     ~2005 - had visual symptoms, was told by Dr. Mare Campbell this may have been a TIA but it's unclear  . Crohn disease ([place])     Remotely - in the 1990s  . Esophageal reflux     Remotely  . 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  [name] History of kidney stones   . Aortic regurgitation   . Asthma     only as child  . Arthritis   . Paroxysmal atrial fibrillation ([facility])     Takes PRN flecainide  . Atrial flutter ([place])     a. Dx 09/2012.  [name] Allergy     Past Surgical History  Procedure Laterality Date  . Arthoscopic rotaor cuff repair    . Tonsillectomy    . Rotator cuff repair  2000  . Knee surgery  1990, 2000  . Hernia repair  2005  . Eye surgery  1950, 1960, 1964  . Lithotripsy    . Cystoscopy/retrograde/ureteroscopy/stone extraction with basket Right 02/12/2013    Procedure: CYSTOSCOPY/RETROGRADE/URETEROSCOPY/STONE  EXTRACTION WITH BASKET, WITH  STENT PLACEMENT RIGHT URETERAL DILATION;  Surgeon: Francis So, MD;  Location: [facility];  Service: Urology;  Laterality: Right;  . Cardioversion N/A 05/08/2013    Procedure: CARDIOVERSION;  Surgeon: Francis Coco, MD;  Location: [facility] ENDOSCOPY;  Service: Cardiovascular;  Laterality: N/A;    Current Outpatient Prescriptions  Medication Sig Dispense Refill  . ALPRAZolam (XANAX) 0.25 MG tablet TAKE 1 TABLET BY MOUTH EVERY DAY AS NEEDED (Patient taking differently: TAKE 1 TABLET BY MOUTH NIGHTLY AS NEEDED FOR SLEEP/ANXIETY.) 90 tablet 1  . Cholecalciferol (VITAMIN D3) 1000 UNITS CAPS Take 2 capsules by mouth every morning.     . diltiazem (CARDIZEM CD) 240 MG 24 hr capsule Take 1 capsule (240 mg total) by mouth every morning. 90 capsule 2  . flecainide (TAMBOCOR) 100 MG tablet 3 tablets as directed by  Dr. Mare Campbell (Patient taking differently: Take 300 mg by mouth daily as needed (when patient goes into a-fib). ) 30 tablet 3  . HYDROcodone-acetaminophen (NORCO/VICODIN) 5-325 MG tablet Take 1 tablet by mouth every 4 (four) hours as needed for moderate pain.    [name] losartan (COZAAR) 25 MG tablet 1/2 tablet daily (Patient taking differently: Take 12.5 mg by mouth every morning. ) 45 tablet 3  . lovastatin (MEVACOR)  20 MG tablet Take 1 tablet (20 mg total) by mouth at bedtime. (Patient taking differently: Take 20 mg by mouth every morning. ) 90 tablet 2  . metoprolol succinate (TOPROL-XL) 100 MG 24 hr tablet TAKE 1 TABLET (100 MG TOTAL) BY MOUTH DAILY. TAKE WITH OR IMMEDIATELY FOLLOWING A MEAL. 90 tablet 2  . Multiple Vitamin (MULTIVITAMIN) tablet Take 1 tablet by mouth daily.    . sertraline (ZOLOFT) 50 MG tablet TAKE 1 TABLET (50 MG TOTAL) BY MOUTH AT BEDTIME. 90 tablet 1  . warfarin (COUMADIN) 5 MG tablet TAKE AS DIRECTED BY ANTICOAGULATION CLINIC (Patient taking differently: Takes one tablet (73m) daily except for on thursday and sunday patient takes two tablets.(10 mg)) 130  tablet 1  . [DISCONTINUED] digoxin (LANOXIN) 0.125 MG tablet Take 1 tablet (125 mcg total) by mouth daily. 90 tablet 3  . [DISCONTINUED] losartan-hydrochlorothiazide (HYZAAR) 100-12.5 MG per tablet     . [DISCONTINUED] pantoprazole (PROTONIX) 40 MG tablet Take 1 tablet (40 mg total) by mouth daily. 30 tablet 1   No current facility-administered medications for this visit.    Allergies as of 07/06/2015 - Review Complete 07/06/2015  Allergen Reaction Noted  . Amiodarone  12/07/2010  . Flomax [tamsulosin hcl]  10/17/2012  . Floxin [ofloxacin]  08/08/2011  . Lisinopril Cough 12/07/2010  . Tavist allergy [clemastine fumarate]  12/07/2010  . Chocolate Palpitations 09/15/2012  . Dextromethorphan Palpitations 10/03/2012  . Robitussin cf [pseudoephedrine-dm-gg] Palpitations 09/09/2011    Family History  Problem Relation Age of Onset  . Stroke Mother   . Hypertension Mother   . Kidney disease Mother     stones  . Nephrolithiasis Brother   . Arthritis Paternal Grandmother   . Heart disease Paternal Grandmother   . Leukemia Father   . Heart disease Father   . Nephrolithiasis Father     Social History   Social History  . Marital Status: Married    Spouse Name: N/A  . Number of Children: 2  . Years of Education: N/A   Occupational History  . 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 Smoking status: Never Smoker   . Smokeless tobacco: Never Used  . Alcohol Use: No  . Drug Use: No  . Sexual Activity: Yes    Birth Control/ Protection: None   Other Topics Concern  . Not on file   Social History Narrative   Non smoker  No ets            Physical Exam: BP 130/80 mmHg  Pulse 64  Ht 6' (1.829 m)  Wt 203 lb 4.8 oz (92.216 kg)  BMI 27.57 kg/m2 Constitutional: generally well-appearing Psychiatric: alert and oriented x3 Abdomen: soft, nontender, nondistended, no obvious ascites, no peritoneal signs, normal bowel sounds   Assessment and plan: 69y.o. male  with mild left lower quadrant abdominal discomfort, chronic  I think this is unlikely anything serious. Perhaps it is related to hernia repair with mesh many years ago. It is positional. I recommended we proceed with CT scan to rule out significant other causes such as neoplasms. He had a colonoscopy 3 years ago and that was normal and I do not think that needs to be repeated now. He understands that if the CAT scan shows no significant etiology but probably warrants no further workup since it is fairly mild discomfort and unlikely anything serious.   DOwens Loffler MD [facility] 07/06/2015, 2:20 PM

## 2015-07-14 ENCOUNTER — Ambulatory Visit (INDEPENDENT_AMBULATORY_CARE_PROVIDER_SITE_OTHER)
Admission: RE | Admit: 2015-07-14 | Discharge: 2015-07-14 | Disposition: A | Discharge status: Home / Self Care | Payer: Medicare Other | Source: Ambulatory Visit | Attending: Gastroenterology | Admitting: Gastroenterology

## 2015-07-14 DIAGNOSIS — N200 Calculus of kidney: Secondary | ICD-10-CM | POA: Diagnosis not present

## 2015-07-14 DIAGNOSIS — R109 Unspecified abdominal pain: Secondary | ICD-10-CM

## 2015-07-14 MED ORDER — IOHEXOL 300 MG/ML  SOLN
100.0000 mL | Freq: Once | INTRAMUSCULAR | Status: AC | PRN
  Administered 2015-07-14: 100 mL via INTRAVENOUS

## 2015-07-15 DIAGNOSIS — M238X1 Other internal derangements of right knee: Secondary | ICD-10-CM | POA: Diagnosis not present

## 2015-07-19 ENCOUNTER — Telehealth: Payer: Self-pay | Admitting: Gastroenterology

## 2015-07-19 NOTE — Telephone Encounter (Signed)
Labs have not been reviewed, I will forward to Dr Ardis Hughs for review.

## 2015-07-19 NOTE — Telephone Encounter (Signed)
See results note from today

## 2015-07-20 DIAGNOSIS — M238X1 Other internal derangements of right knee: Secondary | ICD-10-CM | POA: Diagnosis not present

## 2015-07-20 DIAGNOSIS — M25561 Pain in right knee: Secondary | ICD-10-CM | POA: Diagnosis not present

## 2015-07-23 DIAGNOSIS — M25561 Pain in right knee: Secondary | ICD-10-CM | POA: Diagnosis not present

## 2015-08-09 DIAGNOSIS — N202 Calculus of kidney with calculus of ureter: Secondary | ICD-10-CM | POA: Diagnosis not present

## 2015-08-16 ENCOUNTER — Other Ambulatory Visit: Payer: Self-pay | Admitting: Urology

## 2015-08-17 ENCOUNTER — Ambulatory Visit (INDEPENDENT_AMBULATORY_CARE_PROVIDER_SITE_OTHER): Payer: Medicare Other | Admitting: Pharmacist

## 2015-08-17 DIAGNOSIS — Z5181 Encounter for therapeutic drug level monitoring: Secondary | ICD-10-CM

## 2015-08-17 DIAGNOSIS — I480 Paroxysmal atrial fibrillation: Secondary | ICD-10-CM | POA: Diagnosis not present

## 2015-08-17 DIAGNOSIS — M1711 Unilateral primary osteoarthritis, right knee: Secondary | ICD-10-CM | POA: Diagnosis not present

## 2015-08-17 LAB — POCT INR: INR: 2.4

## 2015-08-17 NOTE — H&P (Signed)
Active Problems Problems  1. Anticoagulant long-term use (Z79.01) 2. Bilateral kidney stones (N20.0) 3. Bladder neck obstruction (N32.0) 4. Calculus of left ureter (N20.1) 5. 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 History of Elevated prostate specific antigen (PSA) (R97.20) 7. 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 Hydronephrosis, left (N13.30) 9. Left nephrolithiasis (N20.0) 10. Microscopic hematuria (R31.29) 11. Nephrolithiasis (N20.0) 12. Renal cyst, acquired (N28.1)  History of Present Illness Francis Campbell returns today in f/u. He had a left ESWL for a proximal ureteral stone on 10/10. He has seen no fragments pass but he had a CT on 10/27 done for some left groin pain that showed several tiny bladder fragments and otherwise stable renal stones. He has no pain, hematuria or voiding complaints at this time.     ROS is otherwise neg.    PSFH reviewed and unchanged. He is on warfarin.   Vitals Vital Signs [Data Includes: Last 1 Day]  Recorded: 22Nov2016 03:11PM  Blood Pressure: 145 / 90 Temperature: 97.3 F Heart Rate: 63  Physical Exam Constitutional: Well nourished and well developed . No acute distress. Lungs CTA, CV RRR.    Results/Data Urine [Data Includes: Last 1 Day]   [id number]  COLOR YELLOW   APPEARANCE CLEAR   SPECIFIC GRAVITY 1.025   pH 5.5   GLUCOSE NEGATIVE   BILIRUBIN NEGATIVE   KETONE NEGATIVE   BLOOD 3+   PROTEIN NEGATIVE   NITRITE NEGATIVE   LEUKOCYTE ESTERASE NEGATIVE   SQUAMOUS EPITHELIAL/HPF 6-10 HPF  WBC 0-5 WBC/HPF  RBC 10-20 RBC/HPF  BACTERIA NONE SEEN HPF  CRYSTALS NONE SEEN HPF  CASTS NONE SEEN LPF  Yeast NONE SEEN HPF   The following images/tracing/specimen were independently visualized:  KUB today shows stable left renal stones with an 74m mid left renal stone that was in the upper pole on a film on 10/5. There are no other changes on his film and no ureteral stones are noted. I reviewed his recent CT and he had stone  fragments in the bladder on that from the prior ESWL.  The following clinical lab reports were reviewed:  UA reviewed.    Assessment Assessed  1. Calculus of left ureter (N20.1) 2. Left nephrolithiasis (N20.0)  He appears to have cleared the left ureteral stone but has a mobile 823mleft mid renal stone and smaller LLP stones.   Plan Health Maintenance  1. UA With REFLEX; [Do Not Release]; Status:Resulted - Requires Verification;   Done: :  [id number]:44PM Hydronephrosis, left  2. RENAL U/S LEFT; Status:Canceled;   After discussing the options he would like to go ahead and have the left renal stone treated. He is aware of the risks and will hold the warfarin per routine.   After a thorough review of the management options for the patient's condition the patient  elected to proceed with surgical therapy as noted above. We have discussed the potential benefits and risks of the procedure, side effects of the proposed treatment, the likelihood of the patient achieving the goals of the procedure, and any potential problems that might occur during the procedure or recuperation. Informed consent has been obtained.

## 2015-08-18 ENCOUNTER — Encounter (HOSPITAL_COMMUNITY): Payer: Self-pay | Admitting: *Deleted

## 2015-08-22 ENCOUNTER — Encounter (HOSPITAL_COMMUNITY)
Admission: RE | Disposition: A | Discharge status: Home / Self Care | Payer: Self-pay | Source: Ambulatory Visit | Attending: Urology

## 2015-08-22 ENCOUNTER — Ambulatory Visit (HOSPITAL_COMMUNITY)
Admission: RE | Admit: 2015-08-22 | Discharge: 2015-08-22 | Disposition: A | Discharge status: Home / Self Care | Payer: Medicare Other | Source: Ambulatory Visit | Attending: Urology | Admitting: Urology

## 2015-08-22 ENCOUNTER — Ambulatory Visit (HOSPITAL_COMMUNITY): Payer: Medicare Other

## 2015-08-22 ENCOUNTER — Encounter (HOSPITAL_COMMUNITY): Payer: Self-pay | Admitting: *Deleted

## 2015-08-22 DIAGNOSIS — N281 Cyst of kidney, acquired: Secondary | ICD-10-CM | POA: Diagnosis not present

## 2015-08-22 DIAGNOSIS — I10 Essential (primary) hypertension: Secondary | ICD-10-CM | POA: Diagnosis not present

## 2015-08-22 DIAGNOSIS — N200 Calculus of kidney: Secondary | ICD-10-CM | POA: Insufficient documentation

## 2015-08-22 DIAGNOSIS — Z87442 Personal history of urinary calculi: Secondary | ICD-10-CM | POA: Diagnosis not present

## 2015-08-22 DIAGNOSIS — Z7901 Long term (current) use of anticoagulants: Secondary | ICD-10-CM | POA: Diagnosis not present

## 2015-08-22 DIAGNOSIS — I499 Cardiac arrhythmia, unspecified: Secondary | ICD-10-CM | POA: Diagnosis not present

## 2015-08-22 LAB — PROTIME-INR
INR: 0.97 (ref 0.00–1.49)
PROTHROMBIN TIME: 13.1 s (ref 11.6–15.2)

## 2015-08-22 SURGERY — LITHOTRIPSY, ESWL
Anesthesia: LOCAL | Laterality: Left

## 2015-08-22 MED ORDER — CIPROFLOXACIN HCL 500 MG PO TABS
500.0000 mg | ORAL_TABLET | ORAL | Status: AC
  Administered 2015-08-22: 500 mg via ORAL
  Filled 2015-08-22: qty 1

## 2015-08-22 MED ORDER — SODIUM CHLORIDE 0.9 % IV SOLN
INTRAVENOUS | Status: DC
  Administered 2015-08-22: 10:00:00 via INTRAVENOUS

## 2015-08-22 MED ORDER — DIPHENHYDRAMINE HCL 25 MG PO CAPS
25.0000 mg | ORAL_CAPSULE | ORAL | Status: AC
  Administered 2015-08-22: 25 mg via ORAL
  Filled 2015-08-22: qty 1

## 2015-08-22 MED ORDER — DIAZEPAM 5 MG PO TABS
10.0000 mg | ORAL_TABLET | ORAL | Status: AC
  Administered 2015-08-22: 10 mg via ORAL
  Filled 2015-08-22: qty 2

## 2015-08-22 NOTE — Interval H&P Note (Signed)
History and Physical Interval Note:  08/22/2015 7:21 AM  Francis Campbell  has presented today for surgery, with the diagnosis of LEFT RENAL STONE  The various methods of treatment have been discussed with the patient and family. After consideration of risks, benefits and other options for treatment, the patient has consented to  Procedure(s): LEFT EXTRACORPOREAL SHOCK WAVE LITHOTRIPSY (ESWL) (Left) as a surgical intervention .  The patient's history has been reviewed, patient examined, no change in status, stable for surgery.  I have reviewed the patient's chart and labs.  Questions were answered to the patient's satisfaction.     Zykiria Bruening A

## 2015-08-22 NOTE — Discharge Instructions (Signed)
I have reviewed discharge instructions in detail with the patient. They will follow-up with me or their physician as scheduled. My nurse will also be calling the patients as per protocol.  Stay off ASA for  Days Stay off warfarin until seen post op

## 2015-08-24 DIAGNOSIS — N200 Calculus of kidney: Secondary | ICD-10-CM | POA: Diagnosis not present

## 2015-09-01 ENCOUNTER — Ambulatory Visit: Payer: Medicare Other | Admitting: Family Medicine

## 2015-09-14 ENCOUNTER — Other Ambulatory Visit: Payer: Self-pay | Admitting: Cardiology

## 2015-09-14 DIAGNOSIS — F419 Anxiety disorder, unspecified: Secondary | ICD-10-CM

## 2015-09-14 NOTE — Telephone Encounter (Signed)
Pt requesting a refill on this medication. Please advise

## 2015-09-27 DIAGNOSIS — H6122 Impacted cerumen, left ear: Secondary | ICD-10-CM | POA: Diagnosis not present

## 2015-09-28 ENCOUNTER — Ambulatory Visit (INDEPENDENT_AMBULATORY_CARE_PROVIDER_SITE_OTHER): Payer: Medicare Other | Admitting: *Deleted

## 2015-09-28 DIAGNOSIS — I480 Paroxysmal atrial fibrillation: Secondary | ICD-10-CM | POA: Diagnosis not present

## 2015-09-28 DIAGNOSIS — Z5181 Encounter for therapeutic drug level monitoring: Secondary | ICD-10-CM

## 2015-09-28 LAB — POCT INR: INR: 2.3

## 2015-10-05 DIAGNOSIS — H6122 Impacted cerumen, left ear: Secondary | ICD-10-CM | POA: Diagnosis not present

## 2015-10-05 DIAGNOSIS — H903 Sensorineural hearing loss, bilateral: Secondary | ICD-10-CM | POA: Diagnosis not present

## 2015-10-10 DIAGNOSIS — N200 Calculus of kidney: Secondary | ICD-10-CM | POA: Diagnosis not present

## 2015-10-10 DIAGNOSIS — Z0000 Encounter for general adult medical examination without abnormal findings: Secondary | ICD-10-CM | POA: Diagnosis not present

## 2015-10-12 ENCOUNTER — Other Ambulatory Visit: Payer: Self-pay | Admitting: Family Medicine

## 2015-10-14 DIAGNOSIS — M1711 Unilateral primary osteoarthritis, right knee: Secondary | ICD-10-CM | POA: Diagnosis not present

## 2015-10-18 DIAGNOSIS — N200 Calculus of kidney: Secondary | ICD-10-CM | POA: Diagnosis not present

## 2015-10-26 ENCOUNTER — Encounter: Payer: Self-pay | Admitting: Cardiology

## 2015-10-26 ENCOUNTER — Ambulatory Visit (INDEPENDENT_AMBULATORY_CARE_PROVIDER_SITE_OTHER): Payer: Medicare Other | Admitting: Cardiology

## 2015-10-26 VITALS — BP 140/72 | HR 64 | Ht 73.0 in | Wt 200.8 lb

## 2015-10-26 DIAGNOSIS — I480 Paroxysmal atrial fibrillation: Secondary | ICD-10-CM | POA: Diagnosis not present

## 2015-10-26 DIAGNOSIS — E7800 Pure hypercholesterolemia, unspecified: Secondary | ICD-10-CM

## 2015-10-26 DIAGNOSIS — I119 Hypertensive heart disease without heart failure: Secondary | ICD-10-CM | POA: Diagnosis not present

## 2015-10-26 NOTE — Patient Instructions (Addendum)
Medication Instructions:  Your physician recommends that you continue on your current medications as directed. Please refer to the Current Medication list given to you today.  Labwork: LP/BMET/HFP ON 11/09/15  Testing/Procedures: NONE  Follow-Up: Your physician wants you to follow-up in: 6 months with fasting labs (lp/bmet/hfp) WITH DR [place] will receive a reminder letter in the mail two months in advance. If you don't receive a letter, please call our office to schedule the follow-up appointment.  If you need a refill on your cardiac medications before your next appointment, please call your pharmacy.

## 2015-10-26 NOTE — Progress Notes (Signed)
Cardiology Office Note   Date:  10/26/2015   ID:  JAXSIN, BOTTOMLEY 10-30-1945, MRN [id number]  PCP:  [name], MD  Cardiologist: Darlin Coco MD  No chief complaint on file.     History of Present Illness: Francis Campbell is a 70 y.o. male who presents for Scheduled 6 month follow-up visit This pleasant 70 year old gentleman is seen for a scheduled followup office visit. The patient underwent outpatient direct current cardioversion on 05/08/13 after conservative measures failed to convert him back to sinus rhythm. He has been on long-term anticoagulation. He has a history of paroxysmal atrial fibrillation and a history of essential hypertension. He has a pill in the pocket flecainide on hand. His previous echocardiograms have shown mild aortic insufficiency. He has had atypical chest pain and has had previously normal nuclear stress tests. He had a stress Myoview on 09/27/11 which showed no ischemia and his ejection fraction was 62%. Overall since last visit he has been feeling well. He is on long-term Coumadin because of his paroxysmal atrial flutter. He had another admission to the hospital with paroxysmal atrial flutter fibrillation 09/30/12- 10/04/12.  He subsequently saw [name] on 11/17/12 to discuss consideration of catheter ablation. At this point he would like to try medical therapy a while longer. He does have flecainide as a "pill in the pocket" and takes 3 100 mg tablets at the onset of his atrial fibrillation. In the past 6 months he has only had to use the fleck and night for one episode of recurrent atrial fibrillation.  The patient retired in 2015. He feels under less stress since retirement. 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 The patient has been having problems with his right knee.  He was climbing steps and he heard his knee pop.  After that he started to have effusions of the knee.  Dr. Theda Sers is his orthopedist.  He has had to have  aspiration of the bloody effusion several times over the past 3 months. Past Medical History  Diagnosis Date  . Hypercholesteremia   . Hypertension   . Valvular heart disease     Mild AI, ASclerosis without AS by echo 10/2011  . TIA (transient ischemic attack)     ~2005 - had visual symptoms, was told by [name] this may have been a TIA but it's unclear  . Crohn disease ([place])     Remotely - in the 1990s  . Esophageal reflux     Remotely  . 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  [name] History of kidney stones   . Aortic regurgitation   . Asthma     only as child  . Arthritis   . Paroxysmal atrial fibrillation ([facility])     Takes PRN flecainide  . Atrial flutter ([place])     a. Dx 09/2012.  [name] Allergy     Past Surgical History  Procedure Laterality Date  . Arthoscopic rotaor cuff repair    . Tonsillectomy    . Rotator cuff repair  2000  . Knee surgery  1990, 2000  . Hernia repair  2005  . Eye surgery  1950, 1960, 1964  . Lithotripsy    . Cystoscopy/retrograde/ureteroscopy/stone extraction with basket Right 02/12/2013    Procedure: CYSTOSCOPY/RETROGRADE/URETEROSCOPY/STONE EXTRACTION WITH BASKET, WITH  STENT PLACEMENT RIGHT URETERAL DILATION;  Surgeon: [name], MD;  Location: [facility];  Service: Urology;  Laterality: Right;  .  Cardioversion N/A 05/08/2013    Procedure: CARDIOVERSION;  Surgeon: Darlin Coco, MD;  Location: [facility] ENDOSCOPY;  Service: Cardiovascular;  Laterality: N/A;     Current Outpatient Prescriptions  Medication Sig Dispense Refill  . ALPRAZolam (XANAX) 0.25 MG tablet Take 0.25 mg by mouth at bedtime as needed for sleep.    . Cholecalciferol (VITAMIN D3) 1000 UNITS CAPS Take 2 capsules by mouth every morning.     . diltiazem (CARDIZEM CD) 240 MG 24 hr capsule Take 1 capsule (240 mg total) by mouth every morning. 90 capsule 2  . flecainide (TAMBOCOR) 100 MG tablet Take 300 mg by mouth as directed. TAke 3103m by mouth daily as needed  for a fib    . losartan (COZAAR) 25 MG tablet Take 12.5 mg by mouth daily.    .Marland Kitchenlovastatin (MEVACOR) 20 MG tablet Take 20 mg by mouth every morning.    . metoprolol succinate (TOPROL-XL) 100 MG 24 hr tablet TAKE 1 TABLET (100 MG TOTAL) BY MOUTH DAILY. TAKE WITH OR IMMEDIATELY FOLLOWING A MEAL. 90 tablet 2  . Multiple Vitamin (MULTIVITAMIN) tablet Take 1 tablet by mouth daily.    . sertraline (ZOLOFT) 50 MG tablet Take 50 mg by mouth at bedtime.    .Marland Kitchenwarfarin (COUMADIN) 5 MG tablet Take 5 mg by mouth as directed. Take by mouth daily as directed by the coumadin clinic    . [DISCONTINUED] digoxin (LANOXIN) 0.125 MG tablet Take 1 tablet (125 mcg total) by mouth daily. 90 tablet 3  . [DISCONTINUED] losartan-hydrochlorothiazide (HYZAAR) 100-12.5 MG per tablet     . [DISCONTINUED] pantoprazole (PROTONIX) 40 MG tablet Take 1 tablet (40 mg total) by mouth daily. 30 tablet 1   No current facility-administered medications for this visit.    Allergies:   Amiodarone; Flomax; Floxin; Lisinopril; Tavist allergy; Chocolate; Dextromethorphan; and Robitussin cf    Social History:  The patient  reports that he has never smoked. He has never used smokeless tobacco. He reports that he does not drink alcohol or use illicit drugs.   Family History:  The patient's family history includes Arthritis in his paternal grandmother; Heart disease in his father and paternal grandmother; Hypertension in his mother; Kidney disease in his mother; Leukemia in his father; Nephrolithiasis in his brother and father; Stroke in his mother.    ROS:  Please see the history of present illness.   Otherwise, review of systems are positive for none.   All other systems are reviewed and negative.    PHYSICAL EXAM: VS:  BP 140/72 mmHg  Pulse 64  Ht 6' 1"  (1.854 m)  Wt 200 lb 12.8 oz (91.082 kg)  BMI 26.50 kg/m2 , BMI Body mass index is 26.5 kg/(m^2). GEN: Well nourished, well developed, in no acute distress HEENT: normal Neck: no  JVD, carotid bruits, or masses Cardiac: RRR; no murmurs, rubs, or gallops,no edema  Respiratory:  clear to auscultation bilaterally, normal work of breathing GI: soft, nontender, nondistended, + BS MS: no deformity or atrophy Skin: warm and dry, no rash Neuro:  Strength and sensation are intact Psych: euthymic mood, full affect   EKG:  EKG is ordered today. The ekg ordered today demonstrates Normal sinus rhythm.  Within normal limits.  Heart rate is 63 bpm   Recent Labs: 04/08/2015: ALT 22 07/06/2015: BUN 26*; Creatinine, Ser 1.02; Potassium 3.5; Sodium 140    Lipid Panel    Component Value Date/Time   CHOL 228* 04/08/2015 1157   TRIG 247.0* 04/08/2015 1157  HDL 40.20 04/08/2015 1157   CHOLHDL 6 04/08/2015 1157   VLDL 49.4* 04/08/2015 1157   LDLCALC 127* 12/25/2010 0936   LDLDIRECT 154.0 04/08/2015 1157      Wt Readings from Last 3 Encounters:  10/26/15 200 lb 12.8 oz (91.082 kg)  08/22/15 195 lb (88.451 kg)  07/06/15 203 lb 4.8 oz (92.216 kg)         ASSESSMENT AND PLAN:  1. Paroxysmal atrial fibrillation on chronic Coumadin.No bleeding problems 2. Mild aortic insufficiency,Asymptomatic 3. Essential hypertension without heart failure.Satisfactory control 4. Hypercholesterolemia.He did not come fasting today so he will return next week for fasting lipid panel hepatic function panel and basal metabolic panel. 5. Depression, improved   Current medicines are reviewed at length with the patient today. The patient does not have concerns regarding medicines.     Current medicines are reviewed at length with the patient today.  The patient does not have concerns regarding medicines.  The following changes have been made:  no change  Labs/ tests ordered today include:   Orders Placed This Encounter  Procedures  . Hepatic function panel  . Basic metabolic panel  . Lipid panel  . EKG 12-Lead     Disposition:   Continue current medication.  Lab work  is pending.  Recheck in 6 months for follow-up office visit and fasting lab work with Dr. Oval Linsey  Signed, Darlin Coco MD 10/26/2015 1:28 PM    [address], [place], [place]  [address] Phone: [phone]; Fax: [phone]

## 2015-10-27 DIAGNOSIS — M7051 Other bursitis of knee, right knee: Secondary | ICD-10-CM | POA: Diagnosis not present

## 2015-10-27 DIAGNOSIS — M1711 Unilateral primary osteoarthritis, right knee: Secondary | ICD-10-CM | POA: Diagnosis not present

## 2015-11-03 DIAGNOSIS — M7051 Other bursitis of knee, right knee: Secondary | ICD-10-CM | POA: Diagnosis not present

## 2015-11-07 DIAGNOSIS — M7051 Other bursitis of knee, right knee: Secondary | ICD-10-CM | POA: Diagnosis not present

## 2015-11-07 DIAGNOSIS — M1711 Unilateral primary osteoarthritis, right knee: Secondary | ICD-10-CM | POA: Diagnosis not present

## 2015-11-09 ENCOUNTER — Other Ambulatory Visit (INDEPENDENT_AMBULATORY_CARE_PROVIDER_SITE_OTHER): Payer: Medicare Other | Admitting: *Deleted

## 2015-11-09 ENCOUNTER — Ambulatory Visit (INDEPENDENT_AMBULATORY_CARE_PROVIDER_SITE_OTHER): Payer: Medicare Other | Admitting: *Deleted

## 2015-11-09 DIAGNOSIS — I119 Hypertensive heart disease without heart failure: Secondary | ICD-10-CM | POA: Diagnosis not present

## 2015-11-09 DIAGNOSIS — I480 Paroxysmal atrial fibrillation: Secondary | ICD-10-CM | POA: Diagnosis not present

## 2015-11-09 DIAGNOSIS — Z5181 Encounter for therapeutic drug level monitoring: Secondary | ICD-10-CM | POA: Diagnosis not present

## 2015-11-09 DIAGNOSIS — E7800 Pure hypercholesterolemia, unspecified: Secondary | ICD-10-CM | POA: Diagnosis not present

## 2015-11-09 LAB — HEPATIC FUNCTION PANEL
ALBUMIN: 3.8 g/dL (ref 3.6–5.1)
ALT: 22 U/L (ref 9–46)
AST: 21 U/L (ref 10–35)
Alkaline Phosphatase: 61 U/L (ref 40–115)
BILIRUBIN INDIRECT: 0.5 mg/dL (ref 0.2–1.2)
BILIRUBIN TOTAL: 0.6 mg/dL (ref 0.2–1.2)
Bilirubin, Direct: 0.1 mg/dL (ref <=0.2)
TOTAL PROTEIN: 6.5 g/dL (ref 6.1–8.1)

## 2015-11-09 LAB — POCT INR: INR: 2.5

## 2015-11-09 LAB — LIPID PANEL
Cholesterol: 205 mg/dL — ABNORMAL HIGH (ref 125–200)
HDL: 34 mg/dL — AB (ref >=40)
LDL CALC: 126 mg/dL (ref <130)
TRIGLYCERIDES: 225 mg/dL — AB (ref <150)
Total CHOL/HDL Ratio: 6 Ratio — ABNORMAL HIGH (ref <=5.0)
VLDL: 45 mg/dL — AB (ref <30)

## 2015-11-09 LAB — BASIC METABOLIC PANEL
BUN: 20 mg/dL (ref 7–25)
CHLORIDE: 105 mmol/L (ref 98–110)
CO2: 21 mmol/L (ref 20–31)
Calcium: 8.8 mg/dL (ref 8.6–10.3)
Creat: 0.86 mg/dL (ref 0.70–1.25)
Glucose, Bld: 99 mg/dL (ref 65–99)
POTASSIUM: 4 mmol/L (ref 3.5–5.3)
Sodium: 137 mmol/L (ref 135–146)

## 2015-11-14 ENCOUNTER — Other Ambulatory Visit: Payer: Self-pay | Admitting: Cardiology

## 2015-11-16 ENCOUNTER — Telehealth: Payer: Self-pay | Admitting: Cardiology

## 2015-11-16 ENCOUNTER — Other Ambulatory Visit: Payer: Self-pay | Admitting: Cardiology

## 2015-11-16 DIAGNOSIS — M7051 Other bursitis of knee, right knee: Secondary | ICD-10-CM | POA: Diagnosis not present

## 2015-11-16 MED ORDER — ROSUVASTATIN CALCIUM 10 MG PO TABS: 10.0000 mg | ORAL_TABLET | Freq: Every day | ORAL | Status: DC

## 2015-11-16 NOTE — Telephone Encounter (Signed)
Follow up      Returning a call to the nurse to get test results

## 2015-11-16 NOTE — Telephone Encounter (Signed)
He is returning a call from yesterday. He thinks it was about results.

## 2015-11-16 NOTE — Telephone Encounter (Signed)
-----   Message from Darlin Coco, MD sent at 11/09/2015  6:02 PM EST ----- Please report.  The lipids are still not down to target.  The LDL still remains high and the HDL is low and the risk ratio is 6.  Liver function studies are normal.  The kidney function is normal and the potassium is normal. For his cholesterol I would like him to stop lovastatin and switched to generic Crestor 10 mg daily

## 2015-11-16 NOTE — Telephone Encounter (Signed)
Advised patient of labs and medication change

## 2015-11-17 DIAGNOSIS — N200 Calculus of kidney: Secondary | ICD-10-CM | POA: Diagnosis not present

## 2015-11-17 DIAGNOSIS — R8299 Other abnormal findings in urine: Secondary | ICD-10-CM | POA: Diagnosis not present

## 2015-11-17 DIAGNOSIS — Z0000 Encounter for general adult medical examination without abnormal findings: Secondary | ICD-10-CM | POA: Diagnosis not present

## 2015-11-20 ENCOUNTER — Encounter: Payer: Self-pay | Admitting: Cardiology

## 2015-11-22 ENCOUNTER — Other Ambulatory Visit: Payer: Self-pay | Admitting: *Deleted

## 2015-11-22 ENCOUNTER — Encounter: Payer: Self-pay | Admitting: *Deleted

## 2015-11-22 NOTE — Telephone Encounter (Signed)
This encounter was created in error - please disregard.

## 2015-11-24 DIAGNOSIS — N200 Calculus of kidney: Secondary | ICD-10-CM | POA: Diagnosis not present

## 2015-11-30 DIAGNOSIS — M1711 Unilateral primary osteoarthritis, right knee: Secondary | ICD-10-CM | POA: Diagnosis not present

## 2015-12-06 ENCOUNTER — Other Ambulatory Visit: Payer: Self-pay | Admitting: *Deleted

## 2015-12-06 MED ORDER — ROSUVASTATIN CALCIUM 10 MG PO TABS
10.0000 mg | ORAL_TABLET | Freq: Every day | ORAL | Status: DC
Start: 2015-12-06 — End: 2016-12-03

## 2015-12-07 DIAGNOSIS — M1711 Unilateral primary osteoarthritis, right knee: Secondary | ICD-10-CM | POA: Diagnosis not present

## 2015-12-14 DIAGNOSIS — M1711 Unilateral primary osteoarthritis, right knee: Secondary | ICD-10-CM | POA: Diagnosis not present

## 2015-12-21 ENCOUNTER — Ambulatory Visit (INDEPENDENT_AMBULATORY_CARE_PROVIDER_SITE_OTHER): Payer: Medicare Other | Admitting: *Deleted

## 2015-12-21 DIAGNOSIS — I480 Paroxysmal atrial fibrillation: Secondary | ICD-10-CM

## 2015-12-21 DIAGNOSIS — Z5181 Encounter for therapeutic drug level monitoring: Secondary | ICD-10-CM

## 2015-12-21 DIAGNOSIS — M1711 Unilateral primary osteoarthritis, right knee: Secondary | ICD-10-CM | POA: Diagnosis not present

## 2015-12-21 LAB — POCT INR: INR: 2.3

## 2015-12-26 ENCOUNTER — Telehealth: Payer: Self-pay | Admitting: Cardiology

## 2015-12-26 NOTE — Telephone Encounter (Signed)
New message   Pt is adding potassium citrate 10 mg take 1 3x day to his medication list  Prescribed by Dr.Wrenn

## 2015-12-28 DIAGNOSIS — M1711 Unilateral primary osteoarthritis, right knee: Secondary | ICD-10-CM | POA: Diagnosis not present

## 2016-01-03 ENCOUNTER — Encounter: Payer: Self-pay | Admitting: Gastroenterology

## 2016-01-03 ENCOUNTER — Encounter: Payer: Self-pay | Admitting: Family Medicine

## 2016-01-04 ENCOUNTER — Other Ambulatory Visit: Payer: Self-pay | Admitting: Cardiovascular Disease

## 2016-01-04 NOTE — Telephone Encounter (Signed)
°*  STAT* If patient is at the pharmacy, call can be transferred to refill team.   1. Which medications need to be refilled? (please list name of each medication and dose if known) Alprazolam .59m  2. Which pharmacy/location (including street and city if local pharmacy) is medication to be sent to? [facility] on [address]  3. Do they need a 30 day or 90 day supply? 90 day

## 2016-01-04 NOTE — Telephone Encounter (Signed)
No answer. No DPR saying message could be left.

## 2016-01-04 NOTE — Telephone Encounter (Signed)
Spoke with pt. He is going to have cardiology refill medication for him, if something comes up and they wont then we can consider continuing this for patient.

## 2016-01-04 NOTE — Telephone Encounter (Signed)
Last seen for CPE 04/2015. Does not look like you have ever filled Alprazolam for him. Okay to fill? Please advise on quantity. He takes one prn for sleep.

## 2016-01-05 ENCOUNTER — Encounter: Payer: Self-pay | Admitting: *Deleted

## 2016-01-05 ENCOUNTER — Telehealth: Payer: Self-pay | Admitting: Family Medicine

## 2016-01-05 MED ORDER — ALPRAZOLAM 0.25 MG PO TABS: 0.2500 mg | ORAL_TABLET | Freq: Every day | ORAL | Status: DC

## 2016-01-05 NOTE — Telephone Encounter (Signed)
Spoke with Dr Elease Hashimoto and he will ok Rx for now with follow up soon Patient aware

## 2016-01-05 NOTE — Telephone Encounter (Signed)
Returned call to pt. He was previously a pt of Dr Mare Ferrari. He needs a refill on his alprazolam. Explained to pt since Dr Mare Ferrari retired that his PCP should begin to manage that medication. Pt said he had already contacted his PCP who told the pt that since Dr. Oval Linsey took over for [name] then Dr. Oval Linsey should prescribed the medication.  Explained to pt that I would forward this message to Dr Oval Linsey for review and not sure if Dr Oval Linsey will renew the Rx.  Routing to Dr Oval Linsey and Rip Harbour P.

## 2016-01-05 NOTE — Telephone Encounter (Signed)
Voicemail was left for pt to return my call   

## 2016-01-05 NOTE — Telephone Encounter (Signed)
I spoke with Francis Campbell at [facility] cardiology.  For several years Dr Mare Ferrari refilled pt's Alprazolam 0.25 mg po qhs prn.  We can take over that as his new cardiologist declines.  Refill Alprazolam 0.25 mg one po qhs #90 with no refill and make sure pt has follow up by July.

## 2016-01-05 NOTE — Telephone Encounter (Signed)
Melinda from [place] would like for you to call her concerning this pt

## 2016-01-05 NOTE — Telephone Encounter (Signed)
Pt aware that Dr Elease Hashimoto will fill his Rx ALPRAZolam (XANAX) 0.25 MG tablet.   Pt has made appointment for 03/14/2016.

## 2016-01-08 ENCOUNTER — Encounter: Payer: Self-pay | Admitting: Cardiovascular Disease

## 2016-01-09 ENCOUNTER — Other Ambulatory Visit: Payer: Self-pay | Admitting: *Deleted

## 2016-01-09 MED ORDER — WARFARIN SODIUM 5 MG PO TABS: ORAL_TABLET | ORAL | Status: DC

## 2016-01-10 ENCOUNTER — Encounter: Payer: Self-pay | Admitting: Family Medicine

## 2016-01-10 NOTE — Telephone Encounter (Signed)
Is this the medication that you were talking about re faxing?

## 2016-01-10 NOTE — Telephone Encounter (Signed)
Rx was call in  

## 2016-01-10 NOTE — Telephone Encounter (Signed)
[facility] did not received rx please refax to [phone] please put member id #[id number]

## 2016-01-27 ENCOUNTER — Other Ambulatory Visit: Payer: Self-pay

## 2016-01-27 MED ORDER — WARFARIN SODIUM 5 MG PO TABS: ORAL_TABLET | ORAL | Status: DC

## 2016-01-29 ENCOUNTER — Encounter: Payer: Self-pay | Admitting: Cardiovascular Disease

## 2016-02-01 ENCOUNTER — Ambulatory Visit (INDEPENDENT_AMBULATORY_CARE_PROVIDER_SITE_OTHER): Payer: Medicare Other | Admitting: *Deleted

## 2016-02-01 ENCOUNTER — Other Ambulatory Visit: Payer: Self-pay | Admitting: *Deleted

## 2016-02-01 DIAGNOSIS — I480 Paroxysmal atrial fibrillation: Secondary | ICD-10-CM

## 2016-02-01 DIAGNOSIS — Z5181 Encounter for therapeutic drug level monitoring: Secondary | ICD-10-CM | POA: Diagnosis not present

## 2016-02-01 LAB — POCT INR: INR: 2.2

## 2016-02-01 MED ORDER — WARFARIN SODIUM 5 MG PO TABS: ORAL_TABLET | ORAL | Status: DC

## 2016-02-06 ENCOUNTER — Encounter: Payer: Self-pay | Admitting: Cardiovascular Disease

## 2016-02-07 ENCOUNTER — Other Ambulatory Visit: Payer: Self-pay

## 2016-02-07 MED ORDER — METOPROLOL SUCCINATE ER 100 MG PO TB24: ORAL_TABLET | ORAL | Status: DC

## 2016-02-07 NOTE — Telephone Encounter (Signed)
Rx(s) sent to pharmacy electronically.  

## 2016-02-07 NOTE — Telephone Encounter (Signed)
Med list updated per pt request.

## 2016-02-09 ENCOUNTER — Encounter: Payer: Self-pay | Admitting: Family Medicine

## 2016-02-11 ENCOUNTER — Other Ambulatory Visit: Payer: Self-pay | Admitting: Pharmacist Clinician (PhC)/ Clinical Pharmacy Specialist

## 2016-02-11 MED ORDER — WARFARIN SODIUM 5 MG PO TABS: ORAL_TABLET | ORAL | Status: DC

## 2016-02-21 ENCOUNTER — Ambulatory Visit (INDEPENDENT_AMBULATORY_CARE_PROVIDER_SITE_OTHER): Payer: Medicare Other | Admitting: Physician Assistant

## 2016-02-21 ENCOUNTER — Ambulatory Visit (INDEPENDENT_AMBULATORY_CARE_PROVIDER_SITE_OTHER): Payer: Medicare Other

## 2016-02-21 VITALS — BP 128/84 | HR 69 | Temp 97.5°F | Resp 17 | Ht 73.0 in | Wt 201.0 lb

## 2016-02-21 DIAGNOSIS — R0781 Pleurodynia: Secondary | ICD-10-CM

## 2016-02-21 NOTE — Patient Instructions (Signed)
     IF you received an x-ray today, you will receive an invoice from [facility]. Please contact [facility] at [phone] with questions or concerns regarding your invoice.   IF you received labwork today, you will receive an invoice from [facility]. Please contact [facility] at [phone] with questions or concerns regarding your invoice.   Our billing staff will not be able to assist you with questions regarding bills from these companies.  You will be contacted with the lab results as soon as they are available. The fastest way to get your results is to activate your My Chart account. Instructions are located on the last page of this paperwork. If you have not heard from us regarding the results in 2 weeks, please contact this office.      

## 2016-02-21 NOTE — Progress Notes (Signed)
02/21/2016 11:29 AM   DOB: 1946-07-24 / MRN: [id number]  SUBJECTIVE:  Francis Campbell is a 70 y.o. male presenting for left sided rib pain that started two weeks ago after leaning over a rail on his deck.  Reports he has been having gradually less pain and denies SOB, presyncope, nausea, new DOE and leg swelling.  He has not taken any medication and feels he does not need it.  He has a history of afib and reports this goes "in and out." He is under the care of a cardiologist for this and takes dilt, flecainide, metop, and coumadin.   He is allergic to amiodarone; flomax; floxin; lisinopril; tavist allergy; chocolate; dextromethorphan; and robitussin cf.   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    He  reports that he has never smoked. He has never used smokeless tobacco. He reports that he does not drink alcohol or use illicit drugs. He  reports that he currently engages in sexual activity. He reports using the following method of birth control/protection: None. The patient  has past surgical history that includes Arthroscopic rotator cuff repair; Tonsillectomy; Rotator cuff repair (2000); Knee surgery (1990, 2000); Hernia repair (2005); Eye surgery ([place]); Lithotripsy; Cystoscopy/retrograde/ureteroscopy/stone extraction with basket (Right, 02/12/2013); and Cardioversion (N/A, 05/08/2013).  His family history includes Arthritis in his paternal grandmother; Heart disease in his father and paternal grandmother; Hypertension in his mother; Kidney disease in his mother; Leukemia in his father; Nephrolithiasis in his brother and father; Stroke in his mother.  Review of Systems  Constitutional: Negative for  fever and chills.  Respiratory: Negative for cough.   Cardiovascular: Positive for chest pain. Negative for palpitations and leg swelling.  Gastrointestinal: Negative for nausea.  Skin: Negative for itching and rash.  Neurological: Negative for dizziness and headaches.    Problem list and medications reviewed and updated by myself where necessary, and exist elsewhere in the encounter.   OBJECTIVE:  BP 128/84 mmHg  Pulse 69  Temp(Src) 97.5 F (36.4 C) (Oral)  Resp 17  Ht 6' 1"  (1.854 m)  Wt 201 lb (91.173 kg)  BMI 26.52 kg/m2  SpO2 97%  Physical Exam  Constitutional: He is oriented to person, place, and time. He appears well-developed. He does not appear ill.  Eyes: Conjunctivae and EOM are normal. Pupils are equal, round, and reactive to light.  Cardiovascular: Normal rate.   Pulmonary/Chest: Effort normal and breath sounds normal.    Abdominal: Soft. Bowel sounds are normal. He exhibits no distension.  Musculoskeletal: Normal range of motion.  Neurological: He is alert and oriented to person, place, and time. No cranial nerve deficit. Coordination normal.  Skin: Skin is warm and dry. He is not diaphoretic.  Psychiatric: He has a normal mood and affect.  Nursing note and vitals reviewed.   No results found for this or any previous visit (from the past 72 hour(s)).  Dg Chest 2 View  02/21/2016  CLINICAL DATA:  Rib pain EXAM: CHEST  2 VIEW COMPARISON:  Portable chest x-ray of 10/03/2012 and two-view chest x-ray of 10/25/2011 FINDINGS: Biapical pleural thickening appears stable. No focal infiltrate or effusion is seen. A probable faintly calcified granuloma remains at the right lung base laterally. No active infiltrate or effusion is seen. Mediastinal and hilar  contours are unremarkable. There is some peribronchial thickening which may indicate bronchitis. No acute rib fracture is noted. The thoracic vertebrae are normal alignment with no compression deformity noted. IMPRESSION:  No active cardiopulmonary disease.  No rib fracture is noted. Electronically Signed   By: Ivar Drape M.D.   On: 02/21/2016 11:26    ASSESSMENT AND PLAN  Quaran was seen today for chest pain.  Diagnoses and all orders for this visit:  Rib tenderness: Advised that we do nothing at this time.  He needs more time to heal.  OTC ibuprofen PRN.   -     DG Chest 2 View; Future    The patient was advised to call or return to clinic if he does not see an improvement in symptoms or to seek the care of the closest emergency department if he worsens with the above plan.   Philis Fendt, MHS, PA-C [facility] 02/21/2016 11:29 AM

## 2016-02-28 DIAGNOSIS — J301 Allergic rhinitis due to pollen: Secondary | ICD-10-CM | POA: Diagnosis not present

## 2016-02-28 DIAGNOSIS — H903 Sensorineural hearing loss, bilateral: Secondary | ICD-10-CM | POA: Diagnosis not present

## 2016-03-10 ENCOUNTER — Encounter: Payer: Self-pay | Admitting: Family Medicine

## 2016-03-14 ENCOUNTER — Ambulatory Visit (INDEPENDENT_AMBULATORY_CARE_PROVIDER_SITE_OTHER): Payer: Medicare Other | Admitting: Family Medicine

## 2016-03-14 ENCOUNTER — Encounter: Payer: Self-pay | Admitting: Family Medicine

## 2016-03-14 ENCOUNTER — Ambulatory Visit (INDEPENDENT_AMBULATORY_CARE_PROVIDER_SITE_OTHER): Payer: Medicare Other | Admitting: *Deleted

## 2016-03-14 VITALS — BP 130/80 | HR 74 | Ht 73.0 in | Wt 200.2 lb

## 2016-03-14 DIAGNOSIS — I480 Paroxysmal atrial fibrillation: Secondary | ICD-10-CM

## 2016-03-14 DIAGNOSIS — F419 Anxiety disorder, unspecified: Secondary | ICD-10-CM | POA: Diagnosis not present

## 2016-03-14 DIAGNOSIS — Z5181 Encounter for therapeutic drug level monitoring: Secondary | ICD-10-CM | POA: Diagnosis not present

## 2016-03-14 LAB — POCT INR: INR: 2.5

## 2016-03-14 NOTE — Patient Instructions (Signed)
Taper off Zoloft as discussed.

## 2016-03-14 NOTE — Progress Notes (Signed)
Subjective:    Patient ID: Francis Campbell, male    DOB: 08-Feb-1946, 70 y.o.   MRN: [id number]  HPI Patient here to discuss medications. Most of his medications are prescribed by cardiology. He has history of depression which has been stable for actually a few years now and he has actually already started tapering himself off sertraline. He has not noted any breakthrough symptoms whatsoever. He had some nonspecific anxiety and in the past cardiology for several years had prescribed his alprazolam. However, with change of provider they declined refilling this. He has some difficulties getting this filled initially. We recommended he try to taper off this is on very low-dose alprazolam 0.25 mg one half tablet daily at bedtime. We explained that he really does not need to taper any at all that low of a dose but would recommend coming off the sertraline first make sure he is stable from that standpoint.  Past Medical History  Diagnosis Date  . Hypercholesteremia   . Hypertension   . Valvular heart disease     Mild AI, ASclerosis without AS by echo 10/2011  . TIA (transient ischemic attack)     ~2005 - had visual symptoms, was told by [name] this may have been a TIA but it's unclear  . Crohn disease ([place])     Remotely - in the 1990s  . Esophageal reflux     Remotely  . 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  [name] History of kidney stones   . Aortic regurgitation   . Asthma     only as child  . Arthritis   . Paroxysmal atrial fibrillation ([facility])     Takes PRN flecainide  . Atrial flutter ([place])     a. Dx 09/2012.  [name] Allergy    Past Surgical History  Procedure Laterality Date  . Arthoscopic rotaor cuff repair    . Tonsillectomy    . Rotator cuff repair  2000  . Knee surgery  1990, 2000  . Hernia repair  2005  . Eye surgery  1950, 1960, 1964  . Lithotripsy    . Cystoscopy/retrograde/ureteroscopy/stone extraction with basket Right 02/12/2013   Procedure: CYSTOSCOPY/RETROGRADE/URETEROSCOPY/STONE EXTRACTION WITH BASKET, WITH  STENT PLACEMENT RIGHT URETERAL DILATION;  Surgeon: [name], MD;  Location: [facility] ORS;  Service: Urology;  Laterality: Right;  . Cardioversion N/A 05/08/2013    Procedure: CARDIOVERSION;  Surgeon: Darlin Coco, MD;  Location: [facility] ENDOSCOPY;  Service: Cardiovascular;  Laterality: N/A;    reports that he has never smoked. He has never used smokeless tobacco. He reports that he does not drink alcohol or use illicit drugs. family history includes Arthritis in his paternal grandmother; Heart disease in his father and paternal grandmother; Hypertension in his mother; Kidney disease in his mother; Leukemia in his father; Nephrolithiasis in his brother and father; Stroke in his mother. Allergies  Allergen Reactions  . Amiodarone     Abn  thyroid  . Flomax [Tamsulosin Hcl]     ? Causes A Fib  . Floxin [Ofloxacin]     hallucinations  . Lisinopril Cough  . Tavist Allergy [Clemastine Fumarate]     A fib worse  . Chocolate Palpitations  . Dextromethorphan Palpitations  . Robitussin Cf [Pseudoephedrine-Dm-Gg] Palpitations      Review of Systems  Constitutional: Negative for fatigue.  Eyes: Negative for visual disturbance.  Respiratory: Negative for cough, chest tightness and shortness of breath.   Cardiovascular: Negative for  chest pain, palpitations and leg swelling.  Neurological: Negative for dizziness, syncope, weakness, light-headedness and headaches.  Psychiatric/Behavioral: Negative for dysphoric mood. The patient is not nervous/anxious.        Objective:   Physical Exam  Constitutional: He appears well-developed and well-nourished.  Cardiovascular: Normal rate and regular rhythm.   Pulmonary/Chest: Effort normal and breath sounds normal. No respiratory distress. He has no wheezes. He has no rales.  Psychiatric: He has a normal mood and affect. His behavior is normal. Judgment and thought content  normal.          Assessment & Plan:  Past history of depression and nonspecific anxiety state. Stable. We recommend gradual tapering off sertraline. After a few weeks if he is doing well all sertraline he'll discontinue alprazolam as well. Follow-up for any recurrent symptoms  [name] MD [facility] at [facility]

## 2016-03-14 NOTE — Progress Notes (Signed)
Pre visit review using our clinic review tool, if applicable. No additional management support is needed unless otherwise documented below in the visit note. 

## 2016-03-26 ENCOUNTER — Other Ambulatory Visit: Payer: Self-pay

## 2016-03-26 MED ORDER — DILTIAZEM HCL ER COATED BEADS 240 MG PO CP24: 240.0000 mg | ORAL_CAPSULE | Freq: Every morning | ORAL | Status: DC

## 2016-03-26 NOTE — Telephone Encounter (Signed)
REFILL 

## 2016-04-04 DIAGNOSIS — M79621 Pain in right upper arm: Secondary | ICD-10-CM | POA: Diagnosis not present

## 2016-04-09 ENCOUNTER — Encounter: Payer: Self-pay | Admitting: Cardiovascular Disease

## 2016-04-09 DIAGNOSIS — N200 Calculus of kidney: Secondary | ICD-10-CM | POA: Diagnosis not present

## 2016-04-12 ENCOUNTER — Telehealth: Payer: Self-pay | Admitting: *Deleted

## 2016-04-12 NOTE — Telephone Encounter (Signed)
From: Francis Campbell  Sent: 04/09/2016  8:54 AM  To: Cv Div Nl Clinical Pool  Subject: Non-Urgent Medical Question             If possible please add the following note to my file concerning AFIB:    10/28/2015 Flecainide  11/14/2015 Flecainide  12/17/2015 Flecainide  01/28/2016 Flecainide  01/29/2016 Flecainide  02/05/2016  02/29/2016 Flecainide  03/31/2016   04/05/2016   04/07/2016  04/09/2016 Flecainide     Message sent from my chart asking that this be put in his chart.

## 2016-04-20 ENCOUNTER — Encounter: Payer: Self-pay | Admitting: Family Medicine

## 2016-04-20 ENCOUNTER — Ambulatory Visit (INDEPENDENT_AMBULATORY_CARE_PROVIDER_SITE_OTHER): Payer: Medicare Other | Admitting: Family Medicine

## 2016-04-20 VITALS — BP 142/80 | HR 64 | Temp 98.5°F | Ht 73.0 in | Wt 199.7 lb

## 2016-04-20 DIAGNOSIS — M6740 Ganglion, unspecified site: Secondary | ICD-10-CM | POA: Diagnosis not present

## 2016-04-20 DIAGNOSIS — M67449 Ganglion, unspecified hand: Secondary | ICD-10-CM

## 2016-04-20 NOTE — Progress Notes (Signed)
Pre visit review using our clinic review tool, if applicable. No additional management support is needed unless otherwise documented below in the visit note. 

## 2016-04-20 NOTE — Patient Instructions (Signed)
Keep wound dry for the first 24 hours then clean daily with soap and water for one week. Apply topical antibiotic daily for 3-4 days. Keep covered with clean dressing for 4-5 days. Follow up promptly for any signs of infection such as redness, warmth, pain, or drainage.

## 2016-04-20 NOTE — Progress Notes (Signed)
Subjective:     Patient ID: Francis Campbell, male   DOB: [birth year], 70 y.o.   MRN: [id number]  HPI Patient seen with cyst on his left second toe which he first noted a couple months ago. Slow increase in size. Slightly tender. No surrounding erythema. No injury. No difficulties with ambulation  Past Medical History:  Diagnosis Date  . Allergy   . Aortic regurgitation   . Arthritis   . Asthma    only as child  . Atrial flutter ([place])    a. Dx 09/2012.  . Crohn disease ([place])    Remotely - in the 1990s  . Esophageal reflux    Remotely  . History of kidney stones   . Hypercholesteremia   . Hypertension   . Paroxysmal atrial fibrillation (HCC)    Takes PRN flecainide  . 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  [name] TIA (transient ischemic attack)    ~2005 - had visual symptoms, was told by [name] this may have been a TIA but it's unclear  . Valvular heart disease    Mild AI, ASclerosis without AS by echo 10/2011   Past Surgical History:  Procedure Laterality Date  . ARTHOSCOPIC ROTAOR CUFF REPAIR    . CARDIOVERSION N/A 05/08/2013   Procedure: CARDIOVERSION;  Surgeon: Darlin Coco, MD;  Location: [facility] ENDOSCOPY;  Service: Cardiovascular;  Laterality: N/A;  . CYSTOSCOPY/RETROGRADE/URETEROSCOPY/STONE EXTRACTION WITH BASKET Right 02/12/2013   Procedure: CYSTOSCOPY/RETROGRADE/URETEROSCOPY/STONE EXTRACTION WITH BASKET, WITH  STENT PLACEMENT RIGHT URETERAL DILATION;  Surgeon: [name], MD;  Location: [facility];  Service: Urology;  Laterality: Right;  . [place]  . HERNIA REPAIR  2005  . [place], 2000  . LITHOTRIPSY    . ROTATOR CUFF REPAIR  2000  . TONSILLECTOMY      reports that he has never smoked. He has never used smokeless tobacco. He reports that he does not drink alcohol or use drugs. family history includes Arthritis in his paternal grandmother; Heart disease in his father and paternal grandmother; Hypertension  in his mother; Kidney disease in his mother; Leukemia in his father; Nephrolithiasis in his brother and father; Stroke in his mother. Allergies  Allergen Reactions  . Amiodarone     Abn  thyroid  . Flomax [Tamsulosin Hcl]     ? Causes A Fib  . Floxin [Ofloxacin]     hallucinations  . Lisinopril Cough  . Tavist Allergy [Clemastine Fumarate]     A fib worse  . Chocolate Palpitations  . Dextromethorphan Palpitations  . Robitussin Cf [Pseudoephedrine-Dm-Gg] Palpitations     Review of Systems  Constitutional: Negative for chills and fever.       Objective:   Physical Exam  Constitutional: He appears well-developed and well-nourished.  Cardiovascular: Normal rate.   Pulmonary/Chest: Effort normal and breath sounds normal. No respiratory distress. He has no wheezes. He has no rales.  Skin:  Patient has somewhat dome-shaped approximately 1 cm cystic lesion dorsal aspect right second toe with translucent appearance with light.       Assessment:     Mucinous cyst of the toe    Plan:     -Explained this is benign and these are frequently seen on fingers and toes. -pt requesting excision/drainage.  Explained that even with drainage they can recur -discussed risks and benefits of digital block of toe and incision and drainage and pt consents.  He is on coumadin  and increased risk of bleeding but no serious risk with  superficial incision. -prepped left 2nd toe with betadine.  Using 1% plain xylocaine digital block of second toe without difficulty Using #15 blade made small linear incision over lesion and drained clear gelatinous substance typical of mucinous cyst.  Scraped base.  Minimal bleeding .  Topical antibiotic and dressing applied.  Wound care instruction given. -follow up for any signs of infection or other concerns.  [name] MD [facility] at [facility]

## 2016-04-22 NOTE — Progress Notes (Signed)
Cardiology Office Note   Date:  04/24/2016   ID:  Francis, Campbell [birth year], MRN [id number]  PCP:  [name], MD  Cardiologist:   Skeet Latch, MD   No chief complaint on file.     History of Present Illness: Francis Campbell is a 70 y.o. male with paroxysmal atrial fibrillation on coumadin, hypertension, hyperlipidemia, and Crohn's disease who presents for follow up.  Francis Campbell was previously a patient of [name].  He has noticed more frequent episodes of atrial fibrillation recently. It typically occurs once per month. If he awakens with that he takes flecainide and by afternoon she converts back to sinus rhythm. When in atrial fibrillation he feels very poorly. He notes slurred speech, fatigue, and dyspnea on exertion. When he is not in atrial fibrillation he has no cardiac symptoms. He does not get much structured exercise but denies any chest pain or shortness of breath with yard work. He denies any lower extremity edema, orthopnea, or PND.  Francis Campbell was evaluated by [name] for catheter ablation in 2014 and elected to use flecainide as a "pill in the pocket."  He underwent DCCV 04/2013.  He had a negative Myoview 09/2011 and an echo 10/2011 that revealed LVEF 60% with mild AR.  [name] checks his blood pressure at home and it is typically around 145/90s.  He denies any low blood pressures.   Past Medical History:  Diagnosis Date  . Allergy   . Aortic regurgitation   . Arthritis   . Asthma    only as child  . Atrial flutter ([place])    a. Dx 09/2012.  . Crohn disease ([place])    Remotely - in the 1990s  . Esophageal reflux    Remotely  . History of kidney stones   . Hypercholesteremia   . Hypertension   . Paroxysmal atrial fibrillation (HCC)    Takes PRN flecainide  . 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  Francis Campbell (transient ischemic attack)    ~2005 - had visual symptoms, was told by [name] this may have been a  Campbell but it's unclear  . Valvular heart disease    Mild AI, ASclerosis without AS by echo 10/2011    Past Surgical History:  Procedure Laterality Date  . ARTHOSCOPIC ROTAOR CUFF REPAIR    . CARDIOVERSION N/A 05/08/2013   Procedure: CARDIOVERSION;  Surgeon: Darlin Coco, MD;  Location[facility] ENDOSCOPY;  Service: Cardiovascular;  Laterality: N/A;  . CYSTOSCOPY/RETROGRADE/URETEROSCOPY/STONE EXTRACTION WITH BASKET Right 02/12/2013   Procedure: CYSTOSCOPY/RETROGRADE/URETEROSCOPY/STONE EXTRACTION WITH BASKET, WITH  STENT PLACEMENT RIGHT URETERAL DILATION;  Surgeon: [name], MD;  Location: [facility] ORS;  Service: Urology;  Laterality: Right;  . [place]  . HERNIA REPAIR  2005  . [place], 2000  . LITHOTRIPSY    . ROTATOR CUFF REPAIR  2000  . TONSILLECTOMY       Current Outpatient Prescriptions  Medication Sig Dispense Refill  . ALPRAZolam (XANAX) 0.25 MG tablet Take 0.25 mg by mouth at bedtime as needed for anxiety.    . Cholecalciferol (VITAMIN D3) 1000 UNITS CAPS Take 2 capsules by mouth every morning.     . diltiazem (CARDIZEM CD) 240 MG 24 hr capsule Take 1 capsule (240 mg total) by mouth every morning. KEEP OV. 90 capsule 0  . flecainide (TAMBOCOR) 100 MG tablet Take 1 tablet (100 mg total) by  mouth 2 (two) times daily. 180 tablet 1  . losartan (COZAAR) 25 MG tablet Take 1 tablet (25 mg total) by mouth daily. 90 tablet 1  . metoprolol succinate (TOPROL-XL) 100 MG 24 hr tablet TAKE 1 TABLET (100 MG TOTAL) BY MOUTH DAILY. TAKE WITH OR IMMEDIATELY FOLLOWING A MEAL. 90 tablet 2  . Multiple Vitamin (MULTIVITAMIN) tablet Take 1 tablet by mouth daily.    . potassium citrate (UROCIT-K) 10 MEQ (1080 MG) SR tablet Take 10 mEq by mouth 3 (three) times daily.  11  . rosuvastatin (CRESTOR) 10 MG tablet Take 1 tablet (10 mg total) by mouth daily. 90 tablet 3  . warfarin (COUMADIN) 5 MG tablet Take 1-2 tablets by mouth daily as directed by coumadin clinic 135 tablet 1   No  current facility-administered medications for this visit.     Allergies:   Amiodarone; Flomax [tamsulosin hcl]; Floxin [ofloxacin]; Lisinopril; Tavist allergy [clemastine fumarate]; Chocolate; Dextromethorphan; and Robitussin cf [pseudoephedrine-dm-gg]    Social History:  The patient  reports that he has never smoked. He has never used smokeless tobacco. He reports that he does not drink alcohol or use drugs.   Family History:  The patient's family history includes Arthritis in his paternal grandmother; Heart disease in his father and paternal grandmother; Hypertension in his mother; Kidney disease in his mother; Leukemia in his father; Nephrolithiasis in his brother and father; Stroke in his mother.    ROS:  Please see the history of present illness.   Otherwise, review of systems are positive for none.   All other systems are reviewed and negative.    PHYSICAL EXAM: VS:  BP (!) 142/66   Pulse 73   Ht 6' 1"  (1.854 m)   Wt 200 lb 12.8 oz (91.1 kg)   BMI 26.49 kg/m  , BMI Body mass index is 26.49 kg/m. GENERAL:  Well appearing HEENT:  Pupils equal round and reactive, fundi not visualized, oral mucosa unremarkable NECK:  No jugular venous distention, waveform within normal limits, carotid upstroke brisk and symmetric, no bruits, no thyromegaly LYMPHATICS:  No cervical adenopathy LUNGS:  Clear to auscultation bilaterally HEART:  RRR.  PMI not displaced or sustained,S1 and S2 within normal limits, no S3, no S4, no clicks, no rubs, I/VI systolic murmur at the RUSB ABD:  Flat, positive bowel sounds normal in frequency in pitch, no bruits, no rebound, no guarding, no midline pulsatile mass, no hepatomegaly, no splenomegaly EXT:  2 plus pulses throughout, no edema, no cyanosis no clubbing SKIN:  No rashes no nodules NEURO:  Cranial nerves II through XII grossly intact, motor grossly intact throughout PSYCH:  Cognitively intact, oriented to person place and time  EKG:  EKG is ordered  today. The ekg ordered today demonstrates sinus rhythm. Rate 73 bpm. PACs  Echo 11/01/11: Study Conclusions  - Left ventricle: Slight upper septal thickening. The cavity size was normal. The estimated ejection fraction was 60%. Wall motion was normal; there were no regional wall motion abnormalities. - Aortic valve: Mild sclerosis without stenosis. Mild regurgitation. - Aorta: Slight dilitation at sinus of Valsalva. Otherwise, aortic size is normal. - Pulmonary arteries: PA peak pressure: 75m Hg (S).  Myoview 09/27/11: Negative for ischemia.  LVEF 62%.  Recent Labs: 11/09/2015: ALT 22; BUN 20; Creat 0.86; Potassium 4.0; Sodium 137    Lipid Panel    Component Value Date/Time   CHOL 205 (H) 11/09/2015 0900   TRIG 225 (H) 11/09/2015 0900   HDL 34 (L) 11/09/2015 0900  CHOLHDL 6.0 (H) 11/09/2015 0900   VLDL 45 (H) 11/09/2015 0900   LDLCALC 126 11/09/2015 0900   LDLDIRECT 154.0 04/08/2015 1157      Wt Readings from Last 3 Encounters:  04/24/16 200 lb 12.8 oz (91.1 kg)  04/20/16 199 lb 11.2 oz (90.6 kg)  03/14/16 200 lb 3.2 oz (90.8 kg)      ASSESSMENT AND PLAN:  # Paroxysmal atrial fibrillation: Mr. Corkins Is experiencing more frequent episodes of atrial fibrillation. Therefore, we will start flecainide prophylactically. He has been taking it as needed for breakthroughs. He will start flecainide 100 mg every 12 hours. He will continue warfarin for anticoagulation.  Continue metoprolol and diltiazem.  This patients CHA2DS2-VASc Score and unadjusted Ischemic Stroke Rate (% per year) is equal to 2.2 % stroke rate/year from a score of 2  Above score calculated as 1 point each if present [CHF, HTN, DM, Vascular=MI/PAD/Aortic Plaque, Age if 65-74, or Male] Above score calculated as 2 points each if present [Age > 75, or Stroke/Campbell/TE]  # Hypertension: BP is above goal today and has been elevated at home as well. He will start taking losartan 25 mg instead of 12.5 mg.   Continue metoprolol and diltiazem.  # Hyperlipidemia:  Lovastatin was switched to rosuvastatin 10/2015. We will check lipids and a compressive metabolic panel.  # Aortic valve sclerosis: Mr. Roper' last echo was in 2013, at which time he was noted to have aortic valve sclerosis. He does have a soft systolic murmur on exam that is likely still consistent with aortic valve sclerosis. He may have mild aortic stenosis. He does not have any symptoms consistent with aortic stenosis. We will likely repeat an echo at his next appointment.  Current medicines are reviewed at length with the patient today.  The patient does not have concerns regarding medicines.  The following changes have been made:  Start flecainide 100 mg twice a day. Increase losartan as above.  Labs/ tests ordered today include:   Orders Placed This Encounter  Procedures  . Lipid panel  . Comprehensive Metabolic Panel (CMET)  . EKG 12-Lead     Disposition:   FU with Luvinia Lucy C. Oval Linsey, MD, [facility] in 2 months.     This note was written with the assistance of speech recognition software.  Please excuse any transcriptional errors.  Signed, Casey Maxfield C. Oval Linsey, MD, [facility]  04/24/2016 1:38 PM    [place]

## 2016-04-24 ENCOUNTER — Encounter: Payer: Self-pay | Admitting: Cardiovascular Disease

## 2016-04-24 ENCOUNTER — Ambulatory Visit (INDEPENDENT_AMBULATORY_CARE_PROVIDER_SITE_OTHER): Payer: Medicare Other | Admitting: Pharmacist Clinician (PhC)/ Clinical Pharmacy Specialist

## 2016-04-24 ENCOUNTER — Ambulatory Visit (INDEPENDENT_AMBULATORY_CARE_PROVIDER_SITE_OTHER): Payer: Medicare Other | Admitting: Cardiovascular Disease

## 2016-04-24 VITALS — BP 142/66 | HR 73 | Ht 73.0 in | Wt 200.8 lb

## 2016-04-24 DIAGNOSIS — E7800 Pure hypercholesterolemia, unspecified: Secondary | ICD-10-CM

## 2016-04-24 DIAGNOSIS — Z5181 Encounter for therapeutic drug level monitoring: Secondary | ICD-10-CM

## 2016-04-24 DIAGNOSIS — I480 Paroxysmal atrial fibrillation: Secondary | ICD-10-CM

## 2016-04-24 DIAGNOSIS — I10 Essential (primary) hypertension: Secondary | ICD-10-CM

## 2016-04-24 DIAGNOSIS — I358 Other nonrheumatic aortic valve disorders: Secondary | ICD-10-CM

## 2016-04-24 DIAGNOSIS — I119 Hypertensive heart disease without heart failure: Secondary | ICD-10-CM

## 2016-04-24 LAB — POCT INR: INR: 2.9

## 2016-04-24 MED ORDER — FLECAINIDE ACETATE 100 MG PO TABS: 100.0000 mg | ORAL_TABLET | Freq: Two times a day (BID) | ORAL | 1 refills | Status: DC

## 2016-04-24 MED ORDER — LOSARTAN POTASSIUM 25 MG PO TABS
25.0000 mg | ORAL_TABLET | Freq: Every day | ORAL | 1 refills | Status: DC
Start: 2016-04-24 — End: 2016-12-02

## 2016-04-24 NOTE — Progress Notes (Signed)
Needs an INR so I can sign.

## 2016-04-24 NOTE — Patient Instructions (Addendum)
Medication Instructions:  START FLECAINIDE 100 MG TWICE A DAY  INCREASE LOSARTAN TO 25 MG TWICE A DAY  Labwork: FASTING LP/CMET TOMORROW AT [facility] ON THE FIRST FLOOR  Testing/Procedures: NONE  Follow-Up: Your physician recommends that you schedule a follow-up appointment in: 2 MONTH OV   If you need a refill on your cardiac medications before your next appointment, please call your pharmacy.

## 2016-04-25 DIAGNOSIS — E7800 Pure hypercholesterolemia, unspecified: Secondary | ICD-10-CM | POA: Diagnosis not present

## 2016-04-25 DIAGNOSIS — I119 Hypertensive heart disease without heart failure: Secondary | ICD-10-CM | POA: Diagnosis not present

## 2016-04-26 ENCOUNTER — Encounter: Payer: Self-pay | Admitting: *Deleted

## 2016-04-26 ENCOUNTER — Encounter: Payer: Self-pay | Admitting: Cardiovascular Disease

## 2016-04-26 LAB — COMPREHENSIVE METABOLIC PANEL
ALBUMIN: 4.3 g/dL (ref 3.6–5.1)
ALK PHOS: 72 U/L (ref 40–115)
ALT: 21 U/L (ref 9–46)
AST: 20 U/L (ref 10–35)
BILIRUBIN TOTAL: 0.8 mg/dL (ref 0.2–1.2)
BUN: 18 mg/dL (ref 7–25)
CALCIUM: 9.2 mg/dL (ref 8.6–10.3)
CO2: 25 mmol/L (ref 20–31)
Chloride: 104 mmol/L (ref 98–110)
Creat: 1 mg/dL (ref 0.70–1.25)
GLUCOSE: 101 mg/dL — AB (ref 65–99)
Potassium: 4.7 mmol/L (ref 3.5–5.3)
Sodium: 139 mmol/L (ref 135–146)
TOTAL PROTEIN: 6.5 g/dL (ref 6.1–8.1)

## 2016-04-26 LAB — LIPID PANEL
CHOLESTEROL: 146 mg/dL (ref 125–200)
HDL: 38 mg/dL — ABNORMAL LOW (ref >=40)
LDL Cholesterol: 64 mg/dL (ref <130)
TRIGLYCERIDES: 221 mg/dL — AB (ref <150)
Total CHOL/HDL Ratio: 3.8 Ratio (ref <=5.0)
VLDL: 44 mg/dL — ABNORMAL HIGH (ref <30)

## 2016-04-26 NOTE — Telephone Encounter (Signed)
This encounter was created in error - please disregard.

## 2016-04-27 ENCOUNTER — Other Ambulatory Visit: Payer: Self-pay | Admitting: *Deleted

## 2016-04-27 NOTE — Telephone Encounter (Signed)
[facility] faxed a request for this medication.  I did not see where Dr Elease Hashimoto prescribed this medication for the pt. Last refill given by Dr Mare Ferrari and note states to contact PCP for additional refills.

## 2016-04-29 NOTE — Telephone Encounter (Signed)
This patient has gotten this for years from Dr Mare Ferrari and he has now retired.  Refill for 6 months.

## 2016-04-30 MED ORDER — ALPRAZOLAM 0.25 MG PO TABS: 0.2500 mg | ORAL_TABLET | Freq: Every evening | ORAL | 1 refills | Status: DC | PRN

## 2016-05-09 DIAGNOSIS — H2513 Age-related nuclear cataract, bilateral: Secondary | ICD-10-CM | POA: Diagnosis not present

## 2016-05-09 DIAGNOSIS — I10 Essential (primary) hypertension: Secondary | ICD-10-CM | POA: Diagnosis not present

## 2016-05-09 DIAGNOSIS — H43393 Other vitreous opacities, bilateral: Secondary | ICD-10-CM | POA: Diagnosis not present

## 2016-05-09 DIAGNOSIS — H52223 Regular astigmatism, bilateral: Secondary | ICD-10-CM | POA: Diagnosis not present

## 2016-05-09 DIAGNOSIS — H35033 Hypertensive retinopathy, bilateral: Secondary | ICD-10-CM | POA: Diagnosis not present

## 2016-05-09 DIAGNOSIS — H5213 Myopia, bilateral: Secondary | ICD-10-CM | POA: Diagnosis not present

## 2016-05-09 DIAGNOSIS — H524 Presbyopia: Secondary | ICD-10-CM | POA: Diagnosis not present

## 2016-05-10 ENCOUNTER — Encounter: Payer: Self-pay | Admitting: Family Medicine

## 2016-05-14 ENCOUNTER — Encounter: Payer: Self-pay | Admitting: Family Medicine

## 2016-05-18 ENCOUNTER — Ambulatory Visit (INDEPENDENT_AMBULATORY_CARE_PROVIDER_SITE_OTHER): Payer: Medicare Other

## 2016-05-18 DIAGNOSIS — Z23 Encounter for immunization: Secondary | ICD-10-CM

## 2016-06-04 ENCOUNTER — Ambulatory Visit (INDEPENDENT_AMBULATORY_CARE_PROVIDER_SITE_OTHER): Payer: Medicare Other | Admitting: Pharmacist

## 2016-06-04 DIAGNOSIS — Z5181 Encounter for therapeutic drug level monitoring: Secondary | ICD-10-CM | POA: Diagnosis not present

## 2016-06-04 DIAGNOSIS — I480 Paroxysmal atrial fibrillation: Secondary | ICD-10-CM

## 2016-06-04 LAB — POCT INR: INR: 3.2

## 2016-06-17 ENCOUNTER — Other Ambulatory Visit: Payer: Self-pay | Admitting: Cardiovascular Disease

## 2016-06-18 NOTE — Telephone Encounter (Signed)
Please review for refill. Thanks!  

## 2016-06-19 NOTE — Telephone Encounter (Signed)
Rx request sent to pharmacy.  

## 2016-06-25 ENCOUNTER — Other Ambulatory Visit: Payer: Self-pay | Admitting: Cardiovascular Disease

## 2016-06-25 ENCOUNTER — Encounter: Payer: Self-pay | Admitting: Family Medicine

## 2016-06-25 NOTE — Progress Notes (Signed)
Cardiology Office Note   Date:  06/26/2016   ID:  VAUN, HYNDMAN 1946/08/24, MRN [id number]  PCP:  [name], MD  Cardiologist:   Skeet Latch, MD   No chief complaint on file.     History of Present Illness: Francis Campbell is a 70 y.o. male with paroxysmal atrial fibrillation on coumadin, hypertension, hyperlipidemia, and Crohn's disease who presents for follow up.  Francis Campbell was previously a patient of [name].   Francis Campbell was evaluated by [name] for catheter ablation in 2014 and elected to use flecainide as a "pill in the pocket."  He underwent DCCV 04/2013.  He had a negative Myoview 09/2011 and an echo 10/2011 that revealed LVEF 60% with mild AR. At his last appointment Francis Campbell was started on flecainide for paroxysmal atrial fibrillation due to more frequent episodes of atrial fibrillation.  Since that time he has been feeling well. He denies any recurrent episodes of atrial fibrillation. He also has not noted any chest pain, shortness of breath, dizziness, lower extremity edema, orthopnea or PND.  He continues to do well with warfarin.  He wonders if it would be okay for him to have tablet every once in a while, as this was previously a trigger for his atrial fibrillation.  Francis Campbell notes that he has not been exercising but plans to start back walking now that the weather is cooler.   Past Medical History:  Diagnosis Date  . Allergy   . Aortic regurgitation   . Arthritis   . Asthma    only as child  . Atrial flutter ([place])    a. Dx 09/2012.  . Crohn disease ([place])    Remotely - in the 1990s  . Esophageal reflux    Remotely  . History of kidney stones   . Hypercholesteremia   . Hypertension   . Paroxysmal atrial fibrillation (HCC)    Takes PRN flecainide  . 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  [name] TIA (transient ischemic attack)    ~2005 - had visual symptoms, was told by [name] this may have been a TIA  but it's unclear  . Valvular heart disease    Mild AI, ASclerosis without AS by echo 10/2011    Past Surgical History:  Procedure Laterality Date  . ARTHOSCOPIC ROTAOR CUFF REPAIR    . CARDIOVERSION N/A 05/08/2013   Procedure: CARDIOVERSION;  Surgeon: Darlin Coco, MD;  Location: [facility] ENDOSCOPY;  Service: Cardiovascular;  Laterality: N/A;  . CYSTOSCOPY/RETROGRADE/URETEROSCOPY/STONE EXTRACTION WITH BASKET Right 02/12/2013   Procedure: CYSTOSCOPY/RETROGRADE/URETEROSCOPY/STONE EXTRACTION WITH BASKET, WITH  STENT PLACEMENT RIGHT URETERAL DILATION;  Surgeon: [name], MD;  Location: [facility] ORS;  Service: Urology;  Laterality: Right;  . [place]  . HERNIA REPAIR  2005  . [place], 2000  . LITHOTRIPSY    . ROTATOR CUFF REPAIR  2000  . TONSILLECTOMY       Current Outpatient Prescriptions  Medication Sig Dispense Refill  . ALPRAZolam (XANAX) 0.25 MG tablet Take 1 tablet (0.25 mg total) by mouth at bedtime as needed for anxiety. 90 tablet 1  . CARTIA XT 240 MG 24 hr capsule TAKE 1 CAPSULE EVERY MORNING. PLEASE KEEP OFFICE VISIT  90 capsule 3  . Cholecalciferol (VITAMIN D3) 1000 UNITS CAPS Take 2 capsules by mouth every morning.     . flecainide (TAMBOCOR) 100 MG tablet Take 1 tablet (  100 mg total) by mouth 2 (two) times daily. 180 tablet 1  . losartan (COZAAR) 25 MG tablet Take 1 tablet (25 mg total) by mouth daily. 90 tablet 1  . metoprolol succinate (TOPROL-XL) 100 MG 24 hr tablet TAKE 1 TABLET (100 MG TOTAL) BY MOUTH DAILY. TAKE WITH OR IMMEDIATELY FOLLOWING A MEAL. 90 tablet 2  . Multiple Vitamin (MULTIVITAMIN) tablet Take 1 tablet by mouth daily.    . potassium citrate (UROCIT-K) 10 MEQ (1080 MG) SR tablet Take 10 mEq by mouth 3 (three) times daily.  11  . rosuvastatin (CRESTOR) 10 MG tablet Take 1 tablet (10 mg total) by mouth daily. 90 tablet 3  . warfarin (COUMADIN) 5 MG tablet Take 1-2 tablets by mouth daily as directed by coumadin clinic 135 tablet 1    No current facility-administered medications for this visit.     Allergies:   Chocolate; Dextromethorphan; Flomax [tamsulosin hcl]; Floxin [ofloxacin]; Lisinopril; Tavist allergy [clemastine fumarate]; Amiodarone; and Robitussin cf [pseudoephedrine-dm-gg]    Social History:  The patient  reports that he has never smoked. He has never used smokeless tobacco. He reports that he does not drink alcohol or use drugs.   Family History:  The patient's family history includes Arthritis in his paternal grandmother; Heart disease in his father and paternal grandmother; Hypertension in his mother; Kidney disease in his mother; Leukemia in his father; Nephrolithiasis in his brother and father; Stroke in his mother.    ROS:  Please see the history of present illness.   Otherwise, review of systems are positive for none.   All other systems are reviewed and negative.    PHYSICAL EXAM: VS:  BP 140/70   Pulse 60   Ht 6' 1"  (1.854 m)   Wt 197 lb 6.4 oz (89.5 kg)   BMI 26.04 kg/m  , BMI Body mass index is 26.04 kg/m. GENERAL:  Well appearing HEENT:  Pupils equal round and reactive, fundi not visualized, oral mucosa unremarkable NECK:  No jugular venous distention, waveform within normal limits, carotid upstroke brisk and symmetric, no bruits, no thyromegaly LYMPHATICS:  No cervical adenopathy LUNGS:  Clear to auscultation bilaterally HEART:  RRR.  PMI not displaced or sustained,S1 and S2 within normal limits, no S3, no S4, no clicks, no rubs, I/VI systolic murmur at the RUSB ABD:  Flat, positive bowel sounds normal in frequency in pitch, no bruits, no rebound, no guarding, no midline pulsatile mass, no hepatomegaly, no splenomegaly EXT:  2 plus pulses throughout, no edema, no cyanosis no clubbing SKIN:  No rashes no nodules NEURO:  Cranial nerves II through XII grossly intact, motor grossly intact throughout PSYCH:  Cognitively intact, oriented to person place and time  EKG:  EKG is ordered  today. The ekg ordered today demonstrates sinus bradycardia rate 52.   Echo 11/01/11: Study Conclusions  - Left ventricle: Slight upper septal thickening. The cavity size was normal. The estimated ejection fraction was 60%. Wall motion was normal; there were no regional wall motion abnormalities. - Aortic valve: Mild sclerosis without stenosis. Mild regurgitation. - Aorta: Slight dilitation at sinus of Valsalva. Otherwise, aortic size is normal. - Pulmonary arteries: PA peak pressure: 29m Hg (S).  Myoview 09/27/11: Negative for ischemia.  LVEF 62%.  Recent Labs: 04/24/2016: ALT 21; BUN 18; Creat 1.00; Potassium 4.7; Sodium 139    Lipid Panel    Component Value Date/Time   CHOL 146 04/24/2016 0859   TRIG 221 (H) 04/24/2016 0859   HDL 38 (L) 04/24/2016  0859   CHOLHDL 3.8 04/24/2016 0859   VLDL 44 (H) 04/24/2016 0859   LDLCALC 64 04/24/2016 0859   LDLDIRECT 154.0 04/08/2015 1157      Wt Readings from Last 3 Encounters:  06/26/16 197 lb 6.4 oz (89.5 kg)  04/24/16 200 lb 12.8 oz (91.1 kg)  04/20/16 199 lb 11.2 oz (90.6 kg)     ASSESSMENT AND PLAN:  # Paroxysmal atrial fibrillation: Mr. Rybacki is doing very well on flecainide.  He has not experienced any recurrent atrial fibrillation.  He will continue warfarin for anticoagulation.  Continue flecainide, metoprolol and diltiazem.  This patients CHA2DS2-VASc Score and unadjusted Ischemic Stroke Rate (% per year) is equal to 2.2 % stroke rate/year from a score of 2  Above score calculated as 1 point each if present [CHF, HTN, DM, Vascular=MI/PAD/Aortic Plaque, Age if 65-74, or Male] Above score calculated as 2 points each if present [Age > 75, or Stroke/TIA/TE]  # Hypertension: BP is well-controlled. Continue losartan, diltiazem and metoprolol.   # Hyperlipidemia:  LDL 64 04/2016.Continue rosuvastatin.    # Aortic valve sclerosis: Mr. Obar' last echo was in 2013, at which time he was noted to have aortic valve  sclerosis. he is asymptomatic.   Current medicines are reviewed at length with the patient today.  The patient does not have concerns regarding medicines.  The following changes have been made:  Start flecainide 100 mg twice a day. Increase losartan as above.  Labs/ tests ordered today include:   Orders Placed This Encounter  Procedures  . EKG 12-Lead     Disposition:   FU with Anyela Napierkowski C. Oval Linsey, MD, [facility] in 6 months.     This note was written with the assistance of speech recognition software.  Please excuse any transcriptional errors.  Signed, Charlen Bakula C. Oval Linsey, MD, [facility]  06/26/2016 9:16 AM    [place]

## 2016-06-26 ENCOUNTER — Ambulatory Visit (INDEPENDENT_AMBULATORY_CARE_PROVIDER_SITE_OTHER): Payer: Medicare Other | Admitting: Pharmacist

## 2016-06-26 ENCOUNTER — Encounter: Payer: Self-pay | Admitting: Cardiovascular Disease

## 2016-06-26 ENCOUNTER — Ambulatory Visit (INDEPENDENT_AMBULATORY_CARE_PROVIDER_SITE_OTHER): Payer: Medicare Other | Admitting: Cardiovascular Disease

## 2016-06-26 VITALS — BP 140/70 | HR 60 | Ht 73.0 in | Wt 197.4 lb

## 2016-06-26 DIAGNOSIS — I480 Paroxysmal atrial fibrillation: Secondary | ICD-10-CM

## 2016-06-26 DIAGNOSIS — E7800 Pure hypercholesterolemia, unspecified: Secondary | ICD-10-CM | POA: Diagnosis not present

## 2016-06-26 DIAGNOSIS — Z5181 Encounter for therapeutic drug level monitoring: Secondary | ICD-10-CM

## 2016-06-26 LAB — POCT INR: INR: 2.2

## 2016-06-26 NOTE — Patient Instructions (Signed)

## 2016-06-28 ENCOUNTER — Other Ambulatory Visit: Payer: Self-pay

## 2016-06-28 DIAGNOSIS — M67449 Ganglion, unspecified hand: Secondary | ICD-10-CM

## 2016-07-04 ENCOUNTER — Encounter: Payer: Self-pay | Admitting: Podiatry

## 2016-07-04 ENCOUNTER — Ambulatory Visit (INDEPENDENT_AMBULATORY_CARE_PROVIDER_SITE_OTHER): Payer: Medicare Other | Admitting: Podiatry

## 2016-07-04 VITALS — BP 147/80 | HR 53 | Ht 73.0 in | Wt 197.0 lb

## 2016-07-04 DIAGNOSIS — D219 Benign neoplasm of connective and other soft tissue, unspecified: Secondary | ICD-10-CM | POA: Diagnosis not present

## 2016-07-04 DIAGNOSIS — M79675 Pain in left toe(s): Secondary | ICD-10-CM | POA: Diagnosis not present

## 2016-07-04 DIAGNOSIS — M67472 Ganglion, left ankle and foot: Secondary | ICD-10-CM | POA: Diagnosis not present

## 2016-07-04 NOTE — Patient Instructions (Signed)
Seen for cystic lesion 2nd toe left. Office procedure done, excision of the lesion. Keep the bandage dry and intact. Return this coming Monday. May take Advil if needed for pain.

## 2016-07-04 NOTE — Progress Notes (Signed)
SUBJECTIVE: 70 y.o. year old male presents complaining of a cyst formed over 2nd digit left foot. It bothers him in closed in shoes. Prior to referral to this office, It was lanced and drained once at his PCP, Dr. Burnett Kanaris office, but the lesion returned.   REVIEW OF SYSTEMS: Pertinent items noted in HPI and remainder of comprehensive ROS otherwise negative. Currently on Coumadin therapy.  OBJECTIVE: DERMATOLOGIC EXAMINATION: Cystic mass without associated edema or erythema over DIPJ of 2nd digit left foot.  No pain unless irritated while wearing closed in shoes.   VASCULAR EXAMINATION OF LOWER LIMBS: All pedal pulses are palpable with normal pulsation.  Capillary Filling times within 3 seconds in all digits.  No edema or erythema associated with cystic lesion 2nd toe left. Temperature gradient from tibial crest to dorsum of foot is within normal bilateral.  NEUROLOGIC EXAMINATION OF THE LOWER LIMBS: All epicritic and tactile sensations grossly intact.   MUSCULOSKELETAL EXAMINATION: Soft cystic mass about 0.8 cm in diameter over distal interphalangeal joint of 2nd digit left foot. Positive for Hypermobile first ray left foot. Tight Achilles tendon right. Cavus type foot with high instep bilateral.  RADIOGRAPHIC FINDINGS: AP View:  Significant for short first metatarsal bone (-5), medially deviated first metatarsal with 12 degree first IM angle, Fibular sesamoid position at 6, and flattening first metatarsal head with narrowed joint space of the first MPJ.  Mild digital contracture of 4th and 5th left. Lateral view:  Hypertrophic and cystic bone formation at dorsal edge of first metatarsal bone, and in joint space of the first MPJ left foot. Supinated foot without other acute changes.  ASSESSMENT: Digital mucoid cyst (~ 0.8 cm diameter) over DIPJ 2nd digit left foot. Pain when shod.   PLAN: Reviewed clinical findings and available treatment options, I&D, excisional,  arthroplastic surgery, all with risk of recurrence.  Patient request for excisional surgical options. Consent form reviewed with emphasis on high risk of recurrence. Patient understands that if the excisional procedure fails, it may require arthroplasty.  Procedure done as follow: 4th digit left foot was anesthetized with total 4 ml of 50/50 mixture 0.5% Marcaine plain and 1% Xylocain with epinephrine. Left foot prepped with Iodine solution. Rubber band tourniquet used for hemostasis. The cystic lesion was excised with double elliptical incision deep down to periosteum and articulation surface. The incision was  1.2 cm long medial to lateral and 0.4 cm wide from proximal to distal in center. The wound was irrigated with normal sterile saline and tight skin suture was placed with 5/0 Nylon. The surgical wound was dressed with Betadine soaked compression dressing.  Intra operative bleeding was minimum. Distal end of the digit had normal skin color after dressing was placed. Patient is to keep the dressing intact and dry. Return in one week.

## 2016-07-05 ENCOUNTER — Encounter: Payer: Self-pay | Admitting: Family Medicine

## 2016-07-09 ENCOUNTER — Encounter: Payer: Self-pay | Admitting: Podiatry

## 2016-07-09 ENCOUNTER — Other Ambulatory Visit: Payer: Self-pay | Admitting: Podiatry

## 2016-07-09 ENCOUNTER — Ambulatory Visit (INDEPENDENT_AMBULATORY_CARE_PROVIDER_SITE_OTHER): Payer: Medicare Other | Admitting: Podiatry

## 2016-07-09 DIAGNOSIS — Z9889 Other specified postprocedural states: Secondary | ICD-10-CM

## 2016-07-09 MED ORDER — CEPHALEXIN 500 MG PO CAPS: 500.0000 mg | ORAL_CAPSULE | Freq: Three times a day (TID) | ORAL | 0 refills | Status: DC

## 2016-07-09 NOTE — Patient Instructions (Signed)
Wound healing normal with some redness. Keflex e scribed. Keep the dressing intact and dry. Return this Friday.

## 2016-07-09 NOTE — Progress Notes (Signed)
One week post op wound following removal of mucoid cyst. Noted of some redness without drainage at surgical site. Wound cleansed with Iodine and dressing re applied.  Keep the dressing clean and dry. Rx for Keflex sent. Return this Friday for follow up.

## 2016-07-13 ENCOUNTER — Ambulatory Visit (INDEPENDENT_AMBULATORY_CARE_PROVIDER_SITE_OTHER): Payer: Medicare Other | Admitting: Podiatry

## 2016-07-13 ENCOUNTER — Encounter: Payer: Self-pay | Admitting: Podiatry

## 2016-07-13 DIAGNOSIS — Z9889 Other specified postprocedural states: Secondary | ICD-10-CM

## 2016-07-13 NOTE — Progress Notes (Signed)
9 day post op wound. Dry healing wound with decreased erythema 2nd toe right. Taking Keflex 500 tid. Wound cleansed and redressed with Iodine dressing. Return in one week for suture removal.

## 2016-07-13 NOTE — Patient Instructions (Addendum)
9 day post op wound.  Dry healing wound with decreased erythema 2nd toe right.  Continue with Keflex 500 tid. Wound cleansed and redressed with Iodine dressing.  Keep the dressing clean and dry. Return in one week for suture removal.

## 2016-07-18 ENCOUNTER — Ambulatory Visit (INDEPENDENT_AMBULATORY_CARE_PROVIDER_SITE_OTHER): Payer: Medicare Other | Admitting: Podiatry

## 2016-07-18 ENCOUNTER — Encounter: Payer: Self-pay | Admitting: Podiatry

## 2016-07-18 DIAGNOSIS — Z9889 Other specified postprocedural states: Secondary | ICD-10-CM

## 2016-07-18 NOTE — Patient Instructions (Signed)
Good wound healing without complication. Suture removed. Follow home care instructions. Return as needed.

## 2016-07-18 NOTE — Progress Notes (Signed)
2 weeks post op excision of mucoid cyst 2nd digit DIPJ left foot wound follow up.   Dry good healing wound 2nd toe right.  Wound cleansed with Iodine and sutures removed. Mefyx tape applied. Sample wrapping dispensed with instruction. Return as needed.

## 2016-07-22 ENCOUNTER — Encounter: Payer: Self-pay | Admitting: Cardiovascular Disease

## 2016-08-06 DIAGNOSIS — N200 Calculus of kidney: Secondary | ICD-10-CM | POA: Diagnosis not present

## 2016-08-07 ENCOUNTER — Ambulatory Visit (INDEPENDENT_AMBULATORY_CARE_PROVIDER_SITE_OTHER): Payer: Medicare Other | Admitting: Pharmacist

## 2016-08-07 DIAGNOSIS — I480 Paroxysmal atrial fibrillation: Secondary | ICD-10-CM | POA: Diagnosis not present

## 2016-08-07 DIAGNOSIS — Z5181 Encounter for therapeutic drug level monitoring: Secondary | ICD-10-CM

## 2016-08-07 LAB — POCT INR: INR: 2.4

## 2016-08-19 ENCOUNTER — Other Ambulatory Visit: Payer: Self-pay | Admitting: Cardiovascular Disease

## 2016-08-20 NOTE — Telephone Encounter (Signed)
Please review for refill. Thanks!  

## 2016-09-18 ENCOUNTER — Ambulatory Visit (INDEPENDENT_AMBULATORY_CARE_PROVIDER_SITE_OTHER): Payer: Medicare Other | Admitting: Pharmacist Clinician (PhC)/ Clinical Pharmacy Specialist

## 2016-09-18 DIAGNOSIS — I480 Paroxysmal atrial fibrillation: Secondary | ICD-10-CM

## 2016-09-18 DIAGNOSIS — Z5181 Encounter for therapeutic drug level monitoring: Secondary | ICD-10-CM | POA: Diagnosis not present

## 2016-09-18 LAB — POCT INR: INR: 2.8

## 2016-10-01 DIAGNOSIS — N200 Calculus of kidney: Secondary | ICD-10-CM | POA: Diagnosis not present

## 2016-10-01 DIAGNOSIS — Z125 Encounter for screening for malignant neoplasm of prostate: Secondary | ICD-10-CM | POA: Diagnosis not present

## 2016-10-05 ENCOUNTER — Other Ambulatory Visit: Payer: Self-pay | Admitting: Cardiovascular Disease

## 2016-10-05 ENCOUNTER — Other Ambulatory Visit: Payer: Self-pay | Admitting: Family Medicine

## 2016-10-08 NOTE — Telephone Encounter (Signed)
Please review for refill. Thanks!  

## 2016-10-08 NOTE — Telephone Encounter (Signed)
Refill with one additional refill.

## 2016-10-08 NOTE — Telephone Encounter (Signed)
Last refill 04-30-2016 #90, 1rf Last OV 04-20-2016 Please advise

## 2016-10-11 ENCOUNTER — Ambulatory Visit (INDEPENDENT_AMBULATORY_CARE_PROVIDER_SITE_OTHER): Payer: Medicare Other

## 2016-10-11 VITALS — BP 110/60 | HR 55 | Ht 74.0 in | Wt 199.0 lb

## 2016-10-11 DIAGNOSIS — Z0000 Encounter for general adult medical examination without abnormal findings: Secondary | ICD-10-CM

## 2016-10-11 DIAGNOSIS — Z7289 Other problems related to lifestyle: Secondary | ICD-10-CM | POA: Diagnosis not present

## 2016-10-11 NOTE — Patient Instructions (Addendum)
Francis Campbell , Thank you for taking time to come for your Medicare Wellness Visit. I appreciate your ongoing commitment to your health goals. Please review the following plan we discussed and let me know if I can assist you in the future.   Will draw the Hep c at your next blood draw   These are the goals we discussed: going to [facility] and walk more often during the week Walking 3 miles  Goals    None      This is a list of the screening recommended for you and due dates:  Health Maintenance  Topic Date Due  .  Hepatitis C: One time screening is recommended by Center for Disease Control  (CDC) for  adults born from [birth year] through 1964.   October 24, 1945  . Colon Cancer Screening  11/06/2021  . Tetanus Vaccine  11/26/2021  . Flu Shot  Completed  . Shingles Vaccine  Completed  . Pneumonia vaccines  Completed     Fall Prevention in the Home Introduction Falls can cause injuries. They can happen to people of all ages. There are many things you can do to make your home safe and to help prevent falls. What can I do on the outside of my home?  Regularly fix the edges of walkways and driveways and fix any cracks.  Remove anything that might make you trip as you walk through a door, such as a raised step or threshold.  Trim any bushes or trees on the path to your home.  Use bright outdoor lighting.  Clear any walking paths of anything that might make someone trip, such as rocks or tools.  Regularly check to see if handrails are loose or broken. Make sure that both sides of any steps have handrails.  Any raised decks and porches should have guardrails on the edges.  Have any leaves, snow, or ice cleared regularly.  Use sand or salt on walking paths during winter.  Clean up any spills in your garage right away. This includes oil or grease spills. What can I do in the bathroom?  Use night lights.  Install grab bars by the toilet and in the tub and shower. Do not use towel  bars as grab bars.  Use non-skid mats or decals in the tub or shower.  If you need to sit down in the shower, use a plastic, non-slip stool.  Keep the floor dry. Clean up any water that spills on the floor as soon as it happens.  Remove soap buildup in the tub or shower regularly.  Attach bath mats securely with double-sided non-slip rug tape.  Do not have throw rugs and other things on the floor that can make you trip. What can I do in the bedroom?  Use night lights.  Make sure that you have a light by your bed that is easy to reach.  Do not use any sheets or blankets that are too big for your bed. They should not hang down onto the floor.  Have a firm chair that has side arms. You can use this for support while you get dressed.  Do not have throw rugs and other things on the floor that can make you trip. What can I do in the kitchen?  Clean up any spills right away.  Avoid walking on wet floors.  Keep items that you use a lot in easy-to-reach places.  If you need to reach something above you, use a strong step  stool that has a grab bar.  Keep electrical cords out of the way.  Do not use floor polish or wax that makes floors slippery. If you must use wax, use non-skid floor wax.  Do not have throw rugs and other things on the floor that can make you trip. What can I do with my stairs?  Do not leave any items on the stairs.  Make sure that there are handrails on both sides of the stairs and use them. Fix handrails that are broken or loose. Make sure that handrails are as long as the stairways.  Check any carpeting to make sure that it is firmly attached to the stairs. Fix any carpet that is loose or worn.  Avoid having throw rugs at the top or bottom of the stairs. If you do have throw rugs, attach them to the floor with carpet tape.  Make sure that you have a light switch at the top of the stairs and the bottom of the stairs. If you do not have them, ask someone to  add them for you. What else can I do to help prevent falls?  Wear shoes that:  Do not have high heels.  Have rubber bottoms.  Are comfortable and fit you well.  Are closed at the toe. Do not wear sandals.  If you use a stepladder:  Make sure that it is fully opened. Do not climb a closed stepladder.  Make sure that both sides of the stepladder are locked into place.  Ask someone to hold it for you, if possible.  Clearly mark and make sure that you can see:  Any grab bars or handrails.  First and last steps.  Where the edge of each step is.  Use tools that help you move around (mobility aids) if they are needed. These include:  Canes.  Walkers.  Scooters.  Crutches.  Turn on the lights when you go into a dark area. Replace any light bulbs as soon as they burn out.  Set up your furniture so you have a clear path. Avoid moving your furniture around.  If any of your floors are uneven, fix them.  If there are any pets around you, be aware of where they are.  Review your medicines with your doctor. Some medicines can make you feel dizzy. This can increase your chance of falling. Ask your doctor what other things that you can do to help prevent falls. This information is not intended to replace advice given to you by your health care provider. Make sure you discuss any questions you have with your health care provider. Document Released: 06/30/2009 Document Revised: 02/09/2016 Document Reviewed: 10/08/2014  2017 [employer]  Health Maintenance, Male A healthy lifestyle and preventative care can promote health and wellness.  Maintain regular health, dental, and eye exams.  Eat a healthy diet. Foods like vegetables, fruits, whole grains, low-fat dairy products, and lean protein foods contain the nutrients you need and are low in calories. Decrease your intake of foods high in solid fats, added sugars, and salt. Get information about a proper diet from your health care  provider, if necessary.  Regular physical exercise is one of the most important things you can do for your health. Most adults should get at least 150 minutes of moderate-intensity exercise (any activity that increases your heart rate and causes you to sweat) each week. In addition, most adults need muscle-strengthening exercises on 2 or more days a week.   Maintain a healthy weight.  The body mass index (BMI) is a screening tool to identify possible weight problems. It provides an estimate of body fat based on height and weight. Your health care provider can find your BMI and can help you achieve or maintain a healthy weight. For males 20 years and older:  A BMI below 18.5 is considered underweight.  A BMI of 18.5 to 24.9 is normal.  A BMI of 25 to 29.9 is considered overweight.  A BMI of 30 and above is considered obese.  Maintain normal blood lipids and cholesterol by exercising and minimizing your intake of saturated fat. Eat a balanced diet with plenty of fruits and vegetables. Blood tests for lipids and cholesterol should begin at age 22 and be repeated every 5 years. If your lipid or cholesterol levels are high, you are over age 32, or you are at high risk for heart disease, you may need your cholesterol levels checked more frequently.Ongoing high lipid and cholesterol levels should be treated with medicines if diet and exercise are not working.  If you smoke, find out from your health care provider how to quit. If you do not use tobacco, do not start.  Lung cancer screening is recommended for adults aged 40-80 years who are at high risk for developing lung cancer because of a history of smoking. A yearly low-dose CT scan of the lungs is recommended for people who have at least a 30-pack-year history of smoking and are current smokers or have quit within the past 15 years. A pack year of smoking is smoking an average of 1 pack of cigarettes a day for 1 year (for example, a 30-pack-year  history of smoking could mean smoking 1 pack a day for 30 years or 2 packs a day for 15 years). Yearly screening should continue until the smoker has stopped smoking for at least 15 years. Yearly screening should be stopped for people who develop a health problem that would prevent them from having lung cancer treatment.  If you choose to drink alcohol, do not have more than 2 drinks per day. One drink is considered to be 12 oz (360 mL) of beer, 5 oz (150 mL) of wine, or 1.5 oz (45 mL) of liquor.  Avoid the use of street drugs. Do not share needles with anyone. Ask for help if you need support or instructions about stopping the use of drugs.  High blood pressure causes heart disease and increases the risk of stroke. High blood pressure is more likely to develop in:  People who have blood pressure in the end of the normal range (100-139/85-89 mm Hg).  People who are overweight or obese.  People who are African American.  If you are 72-51 years of age, have your blood pressure checked every 3-5 years. If you are 3 years of age or older, have your blood pressure checked every year. You should have your blood pressure measured twice-once when you are at a hospital or clinic, and once when you are not at a hospital or clinic. Record the average of the two measurements. To check your blood pressure when you are not at a hospital or clinic, you can use:  An automated blood pressure machine at a pharmacy.  A home blood pressure monitor.  If you are 49-70 years old, ask your health care provider if you should take aspirin to prevent heart disease.  Diabetes screening involves taking a blood sample to check your fasting blood sugar level. This should be done once  every 3 years after age 10 if you are at a normal weight and without risk factors for diabetes. Testing should be considered at a younger age or be carried out more frequently if you are overweight and have at least 1 risk factor for  diabetes.  Colorectal cancer can be detected and often prevented. Most routine colorectal cancer screening begins at the age of 69 and continues through age 62. However, your health care provider may recommend screening at an earlier age if you have risk factors for colon cancer. On a yearly basis, your health care provider may provide home test kits to check for hidden blood in the stool. A small camera at the end of a tube may be used to directly examine the colon (sigmoidoscopy or colonoscopy) to detect the earliest forms of colorectal cancer. Talk to your health care provider about this at age 86 when routine screening begins. A direct exam of the colon should be repeated every 5-10 years through age 24, unless early forms of precancerous polyps or small growths are found.  People who are at an increased risk for hepatitis B should be screened for this virus. You are considered at high risk for hepatitis B if:  You were born in a country where hepatitis B occurs often. Talk with your health care provider about which countries are considered high risk.  Your parents were born in a high-risk country and you have not received a shot to protect against hepatitis B (hepatitis B vaccine).  You have HIV or AIDS.  You use needles to inject street drugs.  You live with, or have sex with, someone who has hepatitis B.  You are a man who has sex with other men (MSM).  You get hemodialysis treatment.  You take certain medicines for conditions like cancer, organ transplantation, and autoimmune conditions.  Hepatitis C blood testing is recommended for all people born from [birth year] through 1964 and any individual with known risk factors for hepatitis C.  Healthy men should no longer receive prostate-specific antigen (PSA) blood tests as part of routine cancer screening. Talk to your health care provider about prostate cancer screening.  Testicular cancer screening is not recommended for adolescents or  adult males who have no symptoms. Screening includes self-exam, a health care provider exam, and other screening tests. Consult with your health care provider about any symptoms you have or any concerns you have about testicular cancer.  Practice safe sex. Use condoms and avoid high-risk sexual practices to reduce the spread of sexually transmitted infections (STIs).  You should be screened for STIs, including gonorrhea and chlamydia if:  You are sexually active and are younger than 24 years.  You are older than 24 years, and your health care provider tells you that you are at risk for this type of infection.  Your sexual activity has changed since you were last screened, and you are at an increased risk for chlamydia or gonorrhea. Ask your health care provider if you are at risk.  If you are at risk of being infected with HIV, it is recommended that you take a prescription medicine daily to prevent HIV infection. This is called pre-exposure prophylaxis (PrEP). You are considered at risk if:  You are a man who has sex with other men (MSM).  You are a heterosexual man who is sexually active with multiple partners.  You take drugs by injection.  You are sexually active with a partner who has HIV.  Talk with your health  care provider about whether you are at high risk of being infected with HIV. If you choose to begin PrEP, you should first be tested for HIV. You should then be tested every 3 months for as long as you are taking PrEP.  Use sunscreen. Apply sunscreen liberally and repeatedly throughout the day. You should seek shade when your shadow is shorter than you. Protect yourself by wearing long sleeves, pants, a wide-brimmed hat, and sunglasses year round whenever you are outdoors.  Tell your health care provider of new moles or changes in moles, especially if there is a change in shape or color. Also, tell your health care provider if a mole is larger than the size of a pencil  eraser.  A one-time screening for abdominal aortic aneurysm (AAA) and surgical repair of large AAAs by ultrasound is recommended for men aged 68-75 years who are current or former smokers.  Stay current with your vaccines (immunizations). This information is not intended to replace advice given to you by your health care provider. Make sure you discuss any questions you have with your health care provider. Document Released: 03/01/2008 Document Revised: 09/24/2014 Document Reviewed: 06/07/2015 Elsevier Interactive Patient Education  2017 [employer].   Hearing Loss Introduction Hearing loss is a partial or total loss of the ability to hear. This can be temporary or permanent, and it can happen in one or both ears. Hearing loss may be referred to as deafness. Medical care is necessary to treat hearing loss properly and to prevent the condition from getting worse. Your hearing may partially or completely come back, depending on what caused your hearing loss and how severe it is. In some cases, hearing loss is permanent. What are the causes? Common causes of hearing loss include:  Too much wax in the ear canal.  Infection of the ear canal or middle ear.  Fluid in the middle ear.  Injury to the ear or surrounding area.  An object stuck in the ear.  Prolonged exposure to loud sounds, such as music. Less common causes of hearing loss include:  Tumors in the ear.  Viral or bacterial infections, such as meningitis.  A hole in the eardrum (perforated eardrum).  Problems with the hearing nerve that sends signals between the brain and the ear.  Certain medicines. What are the signs or symptoms? Symptoms of this condition may include:  Difficulty telling the difference between sounds.  Difficulty following a conversation when there is background noise.  Lack of response to sounds in your environment. This may be most noticeable when you do not respond to startling  sounds.  Needing to turn up the volume on the television, radio, etc.  Ringing in the ears.  Dizziness.  Pain in the ears. How is this diagnosed? This condition is diagnosed based on a physical exam and a hearing test (audiometry). The audiometry test will be performed by a hearing specialist (audiologist). You may also be referred to an ear, nose, and throat (ENT) specialist (otolaryngologist). How is this treated? Treatment for recent onset of hearing loss may include:  Ear wax removal.  Being prescribed medicines to prevent infection (antibiotics).  Being prescribed medicines to reduce inflammation (corticosteroids). Follow these instructions at home:  If you were prescribed an antibiotic medicine, take it as told by your health care provider. Do not stop taking the antibiotic even if you start to feel better.  Take over-the-counter and prescription medicines only as told by your health care provider.  Avoid loud  noises.  Return to your normal activities as told by your health care provider. Ask your health care provider what activities are safe for you.  Keep all follow-up visits as told by your health care provider. This is important. Contact a health care provider if:  You feel dizzy.  You develop new symptoms.  You vomit or feel nauseous.  You have a fever. Get help right away if:  You develop sudden changes in your vision.  You have severe ear pain.  You have new or increased weakness.  You have a severe headache. This information is not intended to replace advice given to you by your health care provider. Make sure you discuss any questions you have with your health care provider. Document Released: 09/03/2005 Document Revised: 02/09/2016 Document Reviewed: 01/19/2015  2017 [employer]

## 2016-10-11 NOTE — Progress Notes (Signed)
Subjective:   Francis Campbell is a 71 y.o. male who presents for Medicare Annual/Subsequent preventive examination.  The Patient was informed that the wellness visit is to identify future health risk and educate and initiate measures that can reduce risk for increased disease through the lifespan.    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 Health is good    The following written screening schedule of preventive measures were reviewed with assessment and plan made per below and patient instructions:  Smoking history reviewed - never smoked  Use of Smokeless tobacco no Second Hand Smoke status; No Smokers in the home/ no  Dental fup; regularly   Discussed allergies Chocolate put him out of rhythm Was cardioverted about 71 yo   ETOH; occasionally   RISK FACTORS Regular exercise- active;  Up and about;   Dog [place] cattle dog Walked 2 miles in the neighborhood  educated regarding active lifestyle according to personal preferences Recommendation per NIH reviewed and given options  Diet;  Meat and 2 veg Uses very little salt;  Does not eat fried foods Nutritious weight  Breakfast; french toast; egg in toast; egg omelet Lunch;  Lee's light diet; can drop 10 to 15 lbs  Lipids reviewed and reducing cholesterol discussed  Referral to Diabetes and Nutritional center if appropriate  Does not deserts  Fall risk:  presented mobile; get up and go WNL  Mobility of Functional changes this year? no  Safety at home and  Community reviewed; Lives in a ranch; bathrooms remodeled  Raised commode  Remodeled home themselves   Depression Screen neg    PhQ 2: negative  Activities of Daily Living - See functional screen    Cognitive testing; Ad8 score; 0 or less than 2  MMSE deferred or completed if AD8 + 2 issues  Advanced Directives reviewed for completion; discussion with MD as well as supportive resources as needed Educated regarding advanced directive    Patient Care Team: [name], MD as PCP - General (Family Medicine)   Preventives screens reviewed Colonoscopy  10/2021 ; completed in 2013  PSA deferred to medical    Immunization History  Administered Date(s) Administered  . Influenza Split 07/30/2011, 07/06/2013  . Influenza, High Dose Seasonal PF 05/26/2015, 05/18/2016  . Influenza,inj,Quad PF,36+ Mos 06/16/2014  . Pneumococcal Conjugate-13 09/17/2010, 08/31/2013  . Pneumococcal Polysaccharide-23 04/08/2015  . Tdap 11/27/2011  . Zoster 09/28/2010   Required Immunizations needed today  Screening test up to date or reviewed for plan of completion Health Maintenance Due  Topic Date Due  . Hepatitis C Screening  01-24-1946   Educated and agreed to hep c screen     Cardiac Risk Factors include: advanced age (>55mn, >>52women);dyslipidemia;hypertension;male gender     Objective:    Vitals: BP 110/60   Pulse (!) 55   Ht 6' 2"  (1.88 m)   Wt 199 lb (90.3 kg)   SpO2 97%   BMI 25.55 kg/m   Body mass index is 25.55 kg/m.  Tobacco History  Smoking Status  . Never Smoker  Smokeless Tobacco  . Never Used     Counseling given: Yes   Past Medical History:  Diagnosis Date  . Allergy   . Aortic regurgitation   .  Arthritis   . Asthma    only as child  . Atrial flutter ([place])    a. Dx 09/2012.  . Crohn disease ([place])    Remotely - in the 1990s  . Esophageal reflux    Remotely  . History of kidney stones   . Hypercholesteremia   . Hypertension   . Paroxysmal atrial fibrillation (HCC)    Takes PRN flecainide  . 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  [name] TIA (transient ischemic attack)    ~2005 - had visual symptoms, was told by Dr.  Mare Ferrari this may have been a TIA but it's unclear  . Valvular heart disease    Mild AI, ASclerosis without AS by echo 10/2011   Past Surgical History:  Procedure Laterality Date  . ARTHOSCOPIC ROTAOR CUFF REPAIR    . CARDIOVERSION N/A 05/08/2013   Procedure: CARDIOVERSION;  Surgeon: Darlin Coco, MD;  Location: [facility] ENDOSCOPY;  Service: Cardiovascular;  Laterality: N/A;  . CYSTOSCOPY/RETROGRADE/URETEROSCOPY/STONE EXTRACTION WITH BASKET Right 02/12/2013   Procedure: CYSTOSCOPY/RETROGRADE/URETEROSCOPY/STONE EXTRACTION WITH BASKET, WITH  STENT PLACEMENT RIGHT URETERAL DILATION;  Surgeon: [name], MD;  Location: [facility] ORS;  Service: Urology;  Laterality: Right;  . [place]  . HERNIA REPAIR  2005  . [place], 2000  . LITHOTRIPSY    . ROTATOR CUFF REPAIR  2000  . TONSILLECTOMY     Family History  Problem Relation Age of Onset  . Stroke Mother   . Hypertension Mother   . Kidney disease Mother     stones  . Nephrolithiasis Brother   . Arthritis Paternal Grandmother   . Heart disease Paternal Grandmother   . Leukemia Father   . Heart disease Father   . Nephrolithiasis Father   . Heart attack Neg Hx    History  Sexual Activity  . Sexual activity: Yes  . Birth control/ protection: None    Outpatient Encounter Prescriptions as of 10/11/2016  Medication Sig  . ALPRAZolam (XANAX) 0.25 MG tablet TAKE 1 TABLET AT BEDTIME AS NEEDED FOR ANXIETY  . CARTIA XT 240 MG 24 hr capsule TAKE 1 CAPSULE EVERY MORNING. PLEASE KEEP OFFICE VISIT   . Cholecalciferol (VITAMIN D3) 1000 UNITS CAPS Take 2 capsules by mouth every morning.   . flecainide (TAMBOCOR) 100 MG tablet TAKE 1 TABLET TWICE DAILY  . losartan (COZAAR) 25 MG tablet Take 1 tablet (25 mg total) by mouth daily.  . metoprolol succinate (TOPROL-XL) 100 MG 24 hr tablet TAKE 1 TABLET (100 MG TOTAL) BY MOUTH DAILY. TAKE WITH OR IMMEDIATELY FOLLOWING A MEAL.  . Multiple Vitamin (MULTIVITAMIN) tablet Take 1 tablet  by mouth daily.  . NON FORMULARY 500 mg.  . potassium citrate (UROCIT-K) 10 MEQ (1080 MG) SR tablet Take 10 mEq by mouth 3 (three) times daily.  . rosuvastatin (CRESTOR) 10 MG tablet Take 1 tablet (10 mg total) by mouth daily.  [name] warfarin (COUMADIN) 5 MG tablet TAKE 1 TO 2 TABLETS  DAILY AS DIRECTED BY COUMADIN CLINIC  . [DISCONTINUED] cephALEXin (KEFLEX) 500 MG capsule Take 1 capsule (500 mg total) by mouth 3 (three) times daily.   No facility-administered encounter medications on file as of 10/11/2016.     Activities of Daily Living In your present state of health, do you have any difficulty performing the following activities: 10/11/2016  Hearing? N  Vision? N  Difficulty concentrating or making decisions? N  Walking or climbing stairs? N  Dressing or bathing? N  Doing errands, shopping? N  Preparing Food and eating ? N  Using the Toilet? N  In the past six months, have you accidently leaked urine? N  Do you have problems with loss of bowel control? N  Managing your Medications? N  Managing your Finances? N  Housekeeping or managing your Housekeeping? N  Some recent data might be hidden    Patient Care Team: [name], MD as PCP - General (Family Medicine)   Assessment:     Exercise Activities and Dietary recommendations Current Exercise Habits: Home exercise routine, Type of exercise: walking, Time (Minutes): 30, Frequency (Times/Week): 5, Weekly Exercise (Minutes/Week): 150, Intensity: Mild  Goals    . Exercise 150 minutes per week (moderate activity)          May walk more Early in the am or late in the evening      Fall Risk Fall Risk  10/11/2016 10/11/2016 02/21/2016 04/08/2015 07/30/2014  Falls in the past year? No No Yes No No  Number falls in past yr: - - 1 - -  Injury with Fall? - - Yes - -   Depression Screen PHQ 2/9 Scores 10/11/2016 10/11/2016 02/21/2016 04/08/2015  PHQ - 2 Score 0 0 0 0    Cognitive Function MMSE - Mini Mental State Exam 10/11/2016    Not completed: (No Data)        Immunization History  Administered Date(s) Administered  . Influenza Split 07/30/2011, 07/06/2013  . Influenza, High Dose Seasonal PF 05/26/2015, 05/18/2016  . Influenza,inj,Quad PF,36+ Mos 06/16/2014  . Pneumococcal Conjugate-13 09/17/2010, 08/31/2013  . Pneumococcal Polysaccharide-23 04/08/2015  . Tdap 11/27/2011  . Zoster 09/28/2010   Screening Tests Health Maintenance  Topic Date Due  . Hepatitis C Screening  Aug 28, 1946  . COLONOSCOPY  11/06/2021  . TETANUS/TDAP  11/26/2021  . INFLUENZA VACCINE  Completed  . ZOSTAVAX  Completed  . PNA vac Low Risk Adult  Completed      Plan:     Ordered hep c for next blood draw  Agrees to lose weight; states he can go on "lean and green" and lose weight easily.   Preventive health up to date   During the course of the visit the patient was educated and counseled about the following appropriate screening and preventive services:   Vaccines to include Pneumoccal, Influenza, Hepatitis B, Td, Zostavax, HCV  Electrocardiogram  Cardiovascular Disease  Colorectal cancer screening  Diabetes screening   Prostate Cancer Screening  Glaucoma screening  Nutrition counseling   Smoking cessation counseling  Patient Instructions (the written plan) was given to the patient.    Wynetta Fines, RN  10/11/2016

## 2016-10-11 NOTE — Progress Notes (Signed)
Colin Benton R., DO

## 2016-10-12 ENCOUNTER — Encounter: Payer: Self-pay | Admitting: Family Medicine

## 2016-10-12 LAB — HEPATITIS C ANTIBODY: HCV Ab: NEGATIVE

## 2016-10-29 ENCOUNTER — Telehealth: Payer: Self-pay | Admitting: Pharmacist

## 2016-10-29 ENCOUNTER — Ambulatory Visit (INDEPENDENT_AMBULATORY_CARE_PROVIDER_SITE_OTHER): Payer: Medicare Other | Admitting: Pharmacist

## 2016-10-29 DIAGNOSIS — Z5181 Encounter for therapeutic drug level monitoring: Secondary | ICD-10-CM | POA: Diagnosis not present

## 2016-10-29 DIAGNOSIS — I480 Paroxysmal atrial fibrillation: Secondary | ICD-10-CM | POA: Diagnosis not present

## 2016-10-29 LAB — POCT INR: INR: 2.2

## 2016-10-29 NOTE — Telephone Encounter (Signed)
Left message to call back  

## 2016-10-29 NOTE — Telephone Encounter (Signed)
Patient requesting f/u appointment with Dr Oval Linsey also asking if blood work needed soon.  Please call patient with additional information and if possible to schedule appointment with Dr Oval Linsey and coumadin clinic at the same time.  Thanks

## 2016-10-30 NOTE — Telephone Encounter (Signed)
Spoke with patient and scheduled follow up appointment  No labs necessary per Dr Oval Linsey

## 2016-10-30 NOTE — Telephone Encounter (Signed)
Follow up ° ° ° ° ° °Returning a call to the nurse °

## 2016-11-14 DIAGNOSIS — H52223 Regular astigmatism, bilateral: Secondary | ICD-10-CM | POA: Diagnosis not present

## 2016-11-14 DIAGNOSIS — H518 Other specified disorders of binocular movement: Secondary | ICD-10-CM | POA: Diagnosis not present

## 2016-11-14 DIAGNOSIS — H25013 Cortical age-related cataract, bilateral: Secondary | ICD-10-CM | POA: Diagnosis not present

## 2016-11-14 DIAGNOSIS — H25813 Combined forms of age-related cataract, bilateral: Secondary | ICD-10-CM | POA: Diagnosis not present

## 2016-11-14 DIAGNOSIS — H5213 Myopia, bilateral: Secondary | ICD-10-CM | POA: Diagnosis not present

## 2016-11-14 DIAGNOSIS — H2589 Other age-related cataract: Secondary | ICD-10-CM | POA: Diagnosis not present

## 2016-12-02 ENCOUNTER — Other Ambulatory Visit: Payer: Self-pay | Admitting: Cardiovascular Disease

## 2016-12-03 ENCOUNTER — Other Ambulatory Visit: Payer: Self-pay

## 2016-12-03 MED ORDER — ROSUVASTATIN CALCIUM 10 MG PO TABS: 10.0000 mg | ORAL_TABLET | Freq: Every day | ORAL | 0 refills | Status: DC

## 2016-12-03 NOTE — Telephone Encounter (Signed)
Will you please review for refill, Thanks!

## 2016-12-05 ENCOUNTER — Other Ambulatory Visit: Payer: Self-pay

## 2016-12-05 MED ORDER — ROSUVASTATIN CALCIUM 10 MG PO TABS: 10.0000 mg | ORAL_TABLET | Freq: Every day | ORAL | 1 refills | Status: DC

## 2016-12-05 NOTE — Progress Notes (Signed)
Cardiology Office Note   Date:  12/06/2016   ID:  NERI, SAMEK 10-29-1945, MRN [id number]  PCP:  [name], MD  Cardiologist:   Skeet Latch, MD   Chief Complaint  Patient presents with  . Follow-up    NO chest pain , shortness of breath, edema, pain or cramping in legs, lightheaded or dizziness      History of Present Illness: ARYA BOXLEY is a 71 y.o. male with paroxysmal atrial fibrillation on coumadin, hypertension, hyperlipidemia, and Crohn's disease who presents for follow up.  Mr. Bonenberger was previously a patient of [name].   Mr. Cowans was evaluated by [name] for catheter ablation in 2014 and elected to use flecainide as a "pill in the pocket."  He underwent DCCV 04/2013.  He had a negative Myoview 09/2011 and an echo 10/2011 that revealed LVEF 60% with mild AR.  He noted increasing episodes of atrial fibrillation and started on daily flecainide 04/2016.  Since that time he's been doing very well. He had 2 episodes of atrial fibrillation, both of which she thinks were triggered by eating chocolate. He's otherwise been feeling very well and has no lightheadedness or dizziness. He has not noted any palpitations outside those episodes. His only complaint is that he's been feeling fatigued. He has no exertional dyspnea or chest pain but just feels generally tired. He has not been exercising regularly and plans to start walking his dogs this summer. He denies lower extremity edema, orthopnea, or PND.   Past Medical History:  Diagnosis Date  . Allergy   . Aortic regurgitation   . Arthritis   . Asthma    only as child  . Atrial flutter ([place])    a. Dx 09/2012.  . Crohn disease ([place])    Remotely - in the 1990s  . Esophageal reflux    Remotely  . History of kidney stones   . Hypercholesteremia   . Hypertension   . Paroxysmal atrial fibrillation (HCC)    Takes PRN flecainide  . 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  [name]  TIA (transient ischemic attack)    ~2005 - had visual symptoms, was told by [name] this may have been a TIA but it's unclear  . Valvular heart disease    Mild AI, ASclerosis without AS by echo 10/2011    Past Surgical History:  Procedure Laterality Date  . ARTHOSCOPIC ROTAOR CUFF REPAIR    . CARDIOVERSION N/A 05/08/2013   Procedure: CARDIOVERSION;  Surgeon: Darlin Coco, MD;  Location: [facility] ENDOSCOPY;  Service: Cardiovascular;  Laterality: N/A;  . CYSTOSCOPY/RETROGRADE/URETEROSCOPY/STONE EXTRACTION WITH BASKET Right 02/12/2013   Procedure: CYSTOSCOPY/RETROGRADE/URETEROSCOPY/STONE EXTRACTION WITH BASKET, WITH  STENT PLACEMENT RIGHT URETERAL DILATION;  Surgeon: [name], MD;  Location: [facility] ORS;  Service: Urology;  Laterality: Right;  . [place]  . HERNIA REPAIR  2005  . [place], 2000  . LITHOTRIPSY    . ROTATOR CUFF REPAIR  2000  . TONSILLECTOMY       Current Outpatient Prescriptions  Medication Sig Dispense Refill  . ALPRAZolam (XANAX) 0.25 MG tablet TAKE 1 TABLET AT BEDTIME AS NEEDED FOR ANXIETY 90 tablet 1  . CARTIA XT 240 MG 24 hr capsule TAKE 1 CAPSULE EVERY MORNING. PLEASE KEEP OFFICE VISIT  90 capsule 3  . Cholecalciferol (VITAMIN D3) 1000 UNITS CAPS Take 2 capsules by mouth every morning.     [name]  flecainide (TAMBOCOR) 100 MG tablet TAKE 1 TABLET TWICE DAILY 180 tablet 1  . losartan (COZAAR) 25 MG tablet TAKE 1 TABLET (25 MG TOTAL) BY MOUTH DAILY. 90 tablet 1  . metoprolol succinate (TOPROL-XL) 100 MG 24 hr tablet TAKE 1 TABLET (100 MG TOTAL) BY MOUTH DAILY. TAKE WITH OR IMMEDIATELY FOLLOWING A MEAL. 90 tablet 2  . Multiple Vitamin (MULTIVITAMIN) tablet Take 1 tablet by mouth daily.    . NON FORMULARY 500 mg.    . potassium citrate (UROCIT-K) 10 MEQ (1080 MG) SR tablet Take 10 mEq by mouth 3 (three) times daily.  11  . rosuvastatin (CRESTOR) 10 MG tablet Take 1 tablet (10 mg total) by mouth daily. 90 tablet 1  . warfarin (COUMADIN) 5 MG  tablet TAKE 1 TO 2 TABLETS  DAILY AS DIRECTED BY COUMADIN CLINIC 180 tablet 0   No current facility-administered medications for this visit.     Allergies:   Chocolate; Dextromethorphan; Flomax [tamsulosin hcl]; Floxin [ofloxacin]; Lisinopril; Tavist allergy [clemastine fumarate]; Amiodarone; and Robitussin cf [pseudoephedrine-dm-gg]    Social History:  The patient  reports that he has never smoked. He has never used smokeless tobacco. He reports that he does not drink alcohol or use drugs.   Family History:  The patient's family history includes Arthritis in his paternal grandmother; Heart disease in his father and paternal grandmother; Hypertension in his mother; Kidney disease in his mother; Leukemia in his father; Nephrolithiasis in his brother and father; Stroke in his mother.    ROS:  Please see the history of present illness.   Otherwise, review of systems are positive for none.   All other systems are reviewed and negative.    PHYSICAL EXAM: VS:  BP 120/70   Pulse (!) 52   Ht 6' 1"  (1.854 m)   Wt 88.9 kg (196 lb)   BMI 25.86 kg/m  , BMI Body mass index is 25.86 kg/m. GENERAL:  Well appearing HEENT:  Pupils equal round and reactive, fundi not visualized, oral mucosa unremarkable NECK:  No jugular venous distention, waveform within normal limits, carotid upstroke brisk and symmetric, no bruits LYMPHATICS:  No cervical adenopathy LUNGS:  Clear to auscultation bilaterally HEART:  RRR.  PMI not displaced or sustained,S1 and S2 within normal limits, no S3, no S4, no clicks, no rubs, I/VI systolic murmur at the RUSB ABD:  Flat, positive bowel sounds normal in frequency in pitch, no bruits, no rebound, no guarding, no midline pulsatile mass, no hepatomegaly, no splenomegaly EXT:  2 plus pulses throughout, no edema, no cyanosis no clubbing SKIN:  No rashes no nodules NEURO:  Cranial nerves II through XII grossly intact, motor grossly intact throughout PSYCH:  Cognitively intact,  oriented to person place and time  EKG:  EKG is ordered today. The ekg ordered today demonstrates sinus bradycardia rate 52.   Echo 11/01/11: Study Conclusions  - Left ventricle: Slight upper septal thickening. The cavity size was normal. The estimated ejection fraction was 60%. Wall motion was normal; there were no regional wall motion abnormalities. - Aortic valve: Mild sclerosis without stenosis. Mild regurgitation. - Aorta: Slight dilitation at sinus of Valsalva. Otherwise, aortic size is normal. - Pulmonary arteries: PA peak pressure: 65m Hg (S).  Myoview 09/27/11: Negative for ischemia.  LVEF 62%.  Recent Labs: 04/24/2016: ALT 21; BUN 18; Creat 1.00; Potassium 4.7; Sodium 139    Lipid Panel    Component Value Date/Time   CHOL 146 04/24/2016 0859   TRIG 221 (H) 04/24/2016  0859   HDL 38 (L) 04/24/2016 0859   CHOLHDL 3.8 04/24/2016 0859   VLDL 44 (H) 04/24/2016 0859   LDLCALC 64 04/24/2016 0859   LDLDIRECT 154.0 04/08/2015 1157      Wt Readings from Last 3 Encounters:  12/06/16 88.9 kg (196 lb)  10/11/16 90.3 kg (199 lb)  07/04/16 89.4 kg (197 lb)     ASSESSMENT AND PLAN:  # Paroxysmal atrial fibrillation: Mr. Arviso is doing very well on flecainide.  His episodes of recurrent atrial fibrillation which triggered by chocolate.  only He will continue warfarin for anticoagulation.  Continue flecainide, metoprolol and diltiazem. we will check electrolytes today. He will need an EKG at his next appointment. Reduce metoprolol to 50 mg daily due to fatigue.  This patients CHA2DS2-VASc Score and unadjusted Ischemic Stroke Rate (% per year) is equal to 2.2 % stroke rate/year from a score of 2  Above score calculated as 1 point each if present [CHF, HTN, DM, Vascular=MI/PAD/Aortic Plaque, Age if 65-74, or Male] Above score calculated as 2 points each if present [Age > 75, or Stroke/TIA/TE]  # Hypertension: BP is well-controlled. Continue losartan, diltiazem and  reduce metoprolol as above.  # Hyperlipidemia:  Check lipids and CMP today.  Continue rosuvastatin.    # Aortic valve sclerosis: Mr. Giammarco' last echo was in 2013, at which time he was noted to have aortic valve sclerosis. he is asymptomatic.   Current medicines are reviewed at length with the patient today.  The patient does not have concerns regarding medicines.  The following changes have been made:  Reduce metoprolol to 50 mg daily.  Labs/ tests ordered today include:   No orders of the defined types were placed in this encounter.    Disposition:   FU with Joseantonio Dittmar C. Oval Linsey, MD, [facility] in 6 months.     This note was written with the assistance of speech recognition software.  Please excuse any transcriptional errors.  Signed, Kadi Hession C. Oval Linsey, MD, [facility]  12/06/2016 8:24 AM    [place]

## 2016-12-06 ENCOUNTER — Ambulatory Visit (INDEPENDENT_AMBULATORY_CARE_PROVIDER_SITE_OTHER): Payer: Medicare Other | Admitting: Cardiovascular Disease

## 2016-12-06 ENCOUNTER — Encounter: Payer: Self-pay | Admitting: Cardiovascular Disease

## 2016-12-06 ENCOUNTER — Ambulatory Visit (INDEPENDENT_AMBULATORY_CARE_PROVIDER_SITE_OTHER): Payer: Medicare Other | Admitting: Pharmacist Clinician (PhC)/ Clinical Pharmacy Specialist

## 2016-12-06 VITALS — BP 120/70 | HR 52 | Ht 73.0 in | Wt 196.0 lb

## 2016-12-06 DIAGNOSIS — E7800 Pure hypercholesterolemia, unspecified: Secondary | ICD-10-CM

## 2016-12-06 DIAGNOSIS — I119 Hypertensive heart disease without heart failure: Secondary | ICD-10-CM

## 2016-12-06 DIAGNOSIS — Z5181 Encounter for therapeutic drug level monitoring: Secondary | ICD-10-CM

## 2016-12-06 DIAGNOSIS — I358 Other nonrheumatic aortic valve disorders: Secondary | ICD-10-CM | POA: Diagnosis not present

## 2016-12-06 DIAGNOSIS — I480 Paroxysmal atrial fibrillation: Secondary | ICD-10-CM | POA: Diagnosis not present

## 2016-12-06 LAB — LIPID PANEL
CHOL/HDL RATIO: 4.4 ratio (ref <5.0)
CHOLESTEROL: 157 mg/dL (ref <200)
HDL: 36 mg/dL — AB (ref >40)
LDL CALC: 74 mg/dL (ref <100)
TRIGLYCERIDES: 235 mg/dL — AB (ref <150)
VLDL: 47 mg/dL — AB (ref <30)

## 2016-12-06 LAB — COMPREHENSIVE METABOLIC PANEL
ALK PHOS: 70 U/L (ref 40–115)
ALT: 27 U/L (ref 9–46)
AST: 22 U/L (ref 10–35)
Albumin: 4.2 g/dL (ref 3.6–5.1)
BUN: 25 mg/dL (ref 7–25)
CALCIUM: 9.4 mg/dL (ref 8.6–10.3)
CHLORIDE: 106 mmol/L (ref 98–110)
CO2: 24 mmol/L (ref 20–31)
Creat: 0.97 mg/dL (ref 0.70–1.25)
GLUCOSE: 95 mg/dL (ref 65–99)
Potassium: 4.7 mmol/L (ref 3.5–5.3)
Sodium: 142 mmol/L (ref 135–146)
Total Bilirubin: 0.7 mg/dL (ref 0.2–1.2)
Total Protein: 6.6 g/dL (ref 6.1–8.1)

## 2016-12-06 LAB — POCT INR: INR: 2.5

## 2016-12-06 NOTE — Patient Instructions (Addendum)
Medication Instructions:  DECREASE YOUR METOPROLOL TO 50 MG DAILY.  Labwork: LP/CMET/MAGNESIUM AT [facility] ON THE FIRST FLOOR  Testing/Procedures: NONE  Follow-Up: Your physician wants you to follow-up in: [place] will receive a reminder letter in the mail two months in advance. If you don't receive a letter, please call our office to schedule the follow-up appointment.  If you need a refill on your cardiac medications before your next appointment, please call your pharmacy.

## 2016-12-07 LAB — MAGNESIUM: MAGNESIUM: 2.2 mg/dL (ref 1.5–2.5)

## 2016-12-11 ENCOUNTER — Ambulatory Visit: Payer: Medicare Other | Admitting: Cardiovascular Disease

## 2017-01-06 ENCOUNTER — Other Ambulatory Visit: Payer: Self-pay | Admitting: Cardiovascular Disease

## 2017-01-07 NOTE — Telephone Encounter (Signed)
Please review for refill. Thanks!  

## 2017-01-18 ENCOUNTER — Ambulatory Visit (INDEPENDENT_AMBULATORY_CARE_PROVIDER_SITE_OTHER): Payer: Medicare Other | Admitting: Pharmacist Clinician (PhC)/ Clinical Pharmacy Specialist

## 2017-01-18 DIAGNOSIS — I480 Paroxysmal atrial fibrillation: Secondary | ICD-10-CM | POA: Diagnosis not present

## 2017-01-18 DIAGNOSIS — Z5181 Encounter for therapeutic drug level monitoring: Secondary | ICD-10-CM | POA: Diagnosis not present

## 2017-01-18 LAB — POCT INR: INR: 2.6

## 2017-02-10 ENCOUNTER — Other Ambulatory Visit: Payer: Self-pay | Admitting: Family Medicine

## 2017-02-13 NOTE — Telephone Encounter (Signed)
Refill for 6 months. 

## 2017-02-13 NOTE — Telephone Encounter (Signed)
Rx done. 

## 2017-02-28 DIAGNOSIS — H6122 Impacted cerumen, left ear: Secondary | ICD-10-CM | POA: Diagnosis not present

## 2017-03-01 ENCOUNTER — Ambulatory Visit (INDEPENDENT_AMBULATORY_CARE_PROVIDER_SITE_OTHER): Payer: Medicare Other | Admitting: Pharmacist

## 2017-03-01 DIAGNOSIS — I480 Paroxysmal atrial fibrillation: Secondary | ICD-10-CM

## 2017-03-01 DIAGNOSIS — Z5181 Encounter for therapeutic drug level monitoring: Secondary | ICD-10-CM

## 2017-03-01 LAB — POCT INR: INR: 2.7

## 2017-03-31 ENCOUNTER — Other Ambulatory Visit: Payer: Self-pay | Admitting: Cardiovascular Disease

## 2017-03-31 ENCOUNTER — Other Ambulatory Visit: Payer: Self-pay | Admitting: Family Medicine

## 2017-04-01 DIAGNOSIS — R972 Elevated prostate specific antigen [PSA]: Secondary | ICD-10-CM | POA: Diagnosis not present

## 2017-04-01 DIAGNOSIS — N200 Calculus of kidney: Secondary | ICD-10-CM | POA: Diagnosis not present

## 2017-04-01 NOTE — Telephone Encounter (Signed)
REFILL 

## 2017-04-01 NOTE — Telephone Encounter (Signed)
Please review for refill, Thanks !  

## 2017-04-02 NOTE — Telephone Encounter (Signed)
LOV for anxiety 03/14/16 Last refill Alprazolam 10/09/16, #90, 1 refill  Please advise. Thanks!

## 2017-04-02 NOTE — Telephone Encounter (Signed)
Needs office follow-up. Refill #30 only with no additional refills

## 2017-04-03 NOTE — Telephone Encounter (Signed)
Called in refill to pharmacy

## 2017-04-03 NOTE — Telephone Encounter (Signed)
appointment made for 04/05/17.

## 2017-04-05 ENCOUNTER — Ambulatory Visit (INDEPENDENT_AMBULATORY_CARE_PROVIDER_SITE_OTHER): Payer: Medicare Other | Admitting: Family Medicine

## 2017-04-05 ENCOUNTER — Encounter: Payer: Self-pay | Admitting: Family Medicine

## 2017-04-05 DIAGNOSIS — F5104 Psychophysiologic insomnia: Secondary | ICD-10-CM | POA: Diagnosis not present

## 2017-04-05 NOTE — Patient Instructions (Signed)

## 2017-04-05 NOTE — Progress Notes (Signed)
Subjective:     Patient ID: Francis Campbell, male   DOB: 08-28-1946, 71 y.o.   MRN: [id number]  HPI Patient here to discuss insomnia and depression. He's been on sertraline for a few years for anxiety and depression symptoms and those are stable. He takes very low-dose alprazolam 0.25 mg at night. He called for refills recently. He has not tried going without this in quite some time. No recent falls.  Past Medical History:  Diagnosis Date  . Allergy   . Aortic regurgitation   . Arthritis   . Asthma    only as child  . Atrial flutter ([place])    a. Dx 09/2012.  . Crohn disease ([place])    Remotely - in the 1990s  . Esophageal reflux    Remotely  . History of kidney stones   . Hypercholesteremia   . Hypertension   . Paroxysmal atrial fibrillation (HCC)    Takes PRN flecainide  . 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  [name] TIA (transient ischemic attack)    ~2005 - had visual symptoms, was told by [name] this may have been a TIA but it's unclear  . Valvular heart disease    Mild AI, ASclerosis without AS by echo 10/2011   Past Surgical History:  Procedure Laterality Date  . ARTHOSCOPIC ROTAOR CUFF REPAIR    . CARDIOVERSION N/A 05/08/2013   Procedure: CARDIOVERSION;  Surgeon: Darlin Coco, MD;  Location: [facility] ENDOSCOPY;  Service: Cardiovascular;  Laterality: N/A;  . CYSTOSCOPY/RETROGRADE/URETEROSCOPY/STONE EXTRACTION WITH BASKET Right 02/12/2013   Procedure: CYSTOSCOPY/RETROGRADE/URETEROSCOPY/STONE EXTRACTION WITH BASKET, WITH  STENT PLACEMENT RIGHT URETERAL DILATION;  Surgeon: [name], MD;  Location: [facility] ORS;  Service: Urology;  Laterality: Right;  . [place]  . HERNIA REPAIR  2005  . [place], 2000  . LITHOTRIPSY    . ROTATOR CUFF REPAIR  2000  . TONSILLECTOMY      reports that he has never smoked. He has never used smokeless tobacco. He reports that he does not drink alcohol or use drugs. family history includes  Arthritis in his paternal grandmother; Heart disease in his father and paternal grandmother; Hypertension in his mother; Kidney disease in his mother; Leukemia in his father; Nephrolithiasis in his brother and father; Stroke in his mother. Allergies  Allergen Reactions  . Chocolate Palpitations  . Dextromethorphan Palpitations  . Flomax [Tamsulosin Hcl] Other (See Comments)    ? Causes A Fib  . Floxin [Ofloxacin] Other (See Comments)    hallucinations  . Lisinopril Cough  . Tavist Allergy [Clemastine Fumarate] Other (See Comments)    A fib worse  . Amiodarone Other (See Comments)    Abn  thyroid  . Robitussin Cf [Pseudoephedrine-Dm-Gg] Palpitations     Review of Systems  Constitutional: Negative for fatigue.  Eyes: Negative for visual disturbance.  Respiratory: Negative for cough, chest tightness and shortness of breath.   Cardiovascular: Negative for chest pain, palpitations and leg swelling.  Neurological: Negative for dizziness, syncope, weakness, light-headedness and headaches.       Objective:   Physical Exam  Constitutional: He is oriented to person, place, and time. He appears well-developed and well-nourished.  HENT:  Right Ear: External ear normal.  Left Ear: External ear normal.  Mouth/Throat: Oropharynx is clear and moist.  Eyes: Pupils are equal, round, and reactive to light.  Neck: Neck supple. No thyromegaly present.  Cardiovascular: Normal rate and regular  rhythm.   Pulmonary/Chest: Effort normal and breath sounds normal. No respiratory distress. He has no wheezes. He has no rales.  Musculoskeletal: He exhibits no edema.  Neurological: He is alert and oriented to person, place, and time.       Assessment:     #1 history of depression stable on sertraline  #2 chronic insomnia    Plan:     -We recommend he try to leave off alprazolam if possible at night. We discussed potential risk of long-term benzodiazepine use even though he is on very low  dosage -Continue sertraline 50 mg daily  [name] MD [facility]

## 2017-04-07 DIAGNOSIS — F5104 Psychophysiologic insomnia: Secondary | ICD-10-CM | POA: Insufficient documentation

## 2017-04-15 ENCOUNTER — Ambulatory Visit (INDEPENDENT_AMBULATORY_CARE_PROVIDER_SITE_OTHER): Payer: Medicare Other | Admitting: Pharmacist

## 2017-04-15 DIAGNOSIS — Z5181 Encounter for therapeutic drug level monitoring: Secondary | ICD-10-CM

## 2017-04-15 DIAGNOSIS — I480 Paroxysmal atrial fibrillation: Secondary | ICD-10-CM | POA: Diagnosis not present

## 2017-04-15 LAB — POCT INR: INR: 2.5

## 2017-05-26 ENCOUNTER — Other Ambulatory Visit: Payer: Self-pay | Admitting: Cardiovascular Disease

## 2017-05-26 ENCOUNTER — Other Ambulatory Visit: Payer: Self-pay | Admitting: Family Medicine

## 2017-05-27 NOTE — Telephone Encounter (Signed)
Last refill 04/03/17 and last office visit 04/05/17. Okay to fill?

## 2017-05-27 NOTE — Telephone Encounter (Signed)
Refill with 2 additional refills.

## 2017-05-27 NOTE — Telephone Encounter (Signed)
Refill Request.  

## 2017-06-01 ENCOUNTER — Other Ambulatory Visit: Payer: Self-pay | Admitting: Cardiovascular Disease

## 2017-06-03 NOTE — Telephone Encounter (Signed)
Please review for refill, thanks ! 

## 2017-06-04 ENCOUNTER — Encounter: Payer: Self-pay | Admitting: Cardiovascular Disease

## 2017-06-04 ENCOUNTER — Ambulatory Visit (INDEPENDENT_AMBULATORY_CARE_PROVIDER_SITE_OTHER): Payer: Medicare Other | Admitting: Cardiovascular Disease

## 2017-06-04 ENCOUNTER — Ambulatory Visit (INDEPENDENT_AMBULATORY_CARE_PROVIDER_SITE_OTHER): Payer: Medicare Other | Admitting: Pharmacist Clinician (PhC)/ Clinical Pharmacy Specialist

## 2017-06-04 VITALS — BP 120/68 | HR 48 | Ht 73.0 in | Wt 193.6 lb

## 2017-06-04 DIAGNOSIS — E7800 Pure hypercholesterolemia, unspecified: Secondary | ICD-10-CM

## 2017-06-04 DIAGNOSIS — Z5181 Encounter for therapeutic drug level monitoring: Secondary | ICD-10-CM | POA: Diagnosis not present

## 2017-06-04 DIAGNOSIS — I480 Paroxysmal atrial fibrillation: Secondary | ICD-10-CM | POA: Diagnosis not present

## 2017-06-04 DIAGNOSIS — I119 Hypertensive heart disease without heart failure: Secondary | ICD-10-CM

## 2017-06-04 DIAGNOSIS — R001 Bradycardia, unspecified: Secondary | ICD-10-CM | POA: Diagnosis not present

## 2017-06-04 DIAGNOSIS — Z7901 Long term (current) use of anticoagulants: Secondary | ICD-10-CM

## 2017-06-04 LAB — POCT INR: INR: 3.3

## 2017-06-04 MED ORDER — METOPROLOL SUCCINATE ER 25 MG PO TB24: 25.0000 mg | ORAL_TABLET | Freq: Every day | ORAL | 1 refills | Status: DC

## 2017-06-04 NOTE — Addendum Note (Signed)
Addended by: Alvina Filbert B on: 06/04/2017 09:58 AM   Modules accepted: Orders

## 2017-06-04 NOTE — Patient Instructions (Addendum)
Medication Instructions:  DECREASE METOPROLOL TO 25 MG DAILY  Labwork: NONE  Testing/Procedures: NONE  Follow-Up: Your physician recommends that you schedule a follow-up appointment in: 4-6 WEEKS   Any Other Special Instructions Will Be Listed Below (If Applicable). MONITOR BLOOD PRESSURE AND HEART RATE AT HOME AND BRING TO YOUR FOLLOW UP   If you need a refill on your cardiac medications before your next appointment, please call your pharmacy.

## 2017-06-04 NOTE — Progress Notes (Signed)
Cardiology Office Note   Date:  06/04/2017   ID:  MASSEY, RUHLAND 12-19-1945, MRN [id number]  PCP:  [name], MD  Cardiologist:   Skeet Latch, MD   No chief complaint on file.     History of Present Illness: Francis Campbell is a 71 y.o. male with paroxysmal atrial fibrillation on coumadin, hypertension, hyperlipidemia, and Crohn's disease who presents for follow up.  Francis Campbell was previously a patient of Francis Campbell.   Francis Campbell was evaluated by Francis Campbell for catheter ablation in 2014 and elected to use flecainide as a "pill in the pocket."  He underwent DCCV 04/2013.  He had a negative Myoview 09/2011 and an echo 10/2011 that revealed LVEF 60% with mild AR.  He noted increasing episodes of atrial fibrillation and started on daily flecainide 04/2016.  Since his last appointment Francis Campbell has been doing well.  He has not experienced any recurrent atrial fibrillation. He has been avoiding chocolate, as this is his known trigger. He denies any palpitations, chest pain, lightheadedness or dizziness. He also has not noted any lower extremity edema, orthopnea, or PND.  He reports that his BP at home has been in the 130s/70s.  He has noted some fatigue.   Past Medical History:  Diagnosis Date  . Allergy   . Aortic regurgitation   . Arthritis   . Asthma    only as child  . Atrial flutter ([place])    a. Dx 09/2012.  . Crohn disease ([place])    Remotely - in the 1990s  . Esophageal reflux    Remotely  . History of kidney stones   . Hypercholesteremia   . Hypertension   . Paroxysmal atrial fibrillation (HCC)    Takes PRN flecainide  . 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  [name] TIA (transient ischemic attack)    ~2005 - had visual symptoms, was told by Francis Campbell this may have been a TIA but it's unclear  . Valvular heart disease    Mild AI, ASclerosis without AS by echo 10/2011    Past Surgical History:  Procedure Laterality Date  .  ARTHOSCOPIC ROTAOR CUFF REPAIR    . CARDIOVERSION N/A 05/08/2013   Procedure: CARDIOVERSION;  Surgeon: Darlin Coco, MD;  Location: [facility] ENDOSCOPY;  Service: Cardiovascular;  Laterality: N/A;  . CYSTOSCOPY/RETROGRADE/URETEROSCOPY/STONE EXTRACTION WITH BASKET Right 02/12/2013   Procedure: CYSTOSCOPY/RETROGRADE/URETEROSCOPY/STONE EXTRACTION WITH BASKET, WITH  STENT PLACEMENT RIGHT URETERAL DILATION;  Surgeon: [name], MD;  Location: [facility] ORS;  Service: Urology;  Laterality: Right;  . [place]  . HERNIA REPAIR  2005  . [place], 2000  . LITHOTRIPSY    . ROTATOR CUFF REPAIR  2000  . TONSILLECTOMY       Current Outpatient Prescriptions  Medication Sig Dispense Refill  . ALPRAZolam (XANAX) 0.25 MG tablet TAKE 1 TABLET AT BEDTIME AS NEEDED FOR ANXIETY 30 tablet 2  . CARTIA XT 240 MG 24 hr capsule TAKE 1 CAPSULE EVERY MORNING. PLEASE KEEP OFFICE VISIT 90 capsule 3  . Cholecalciferol (VITAMIN D3) 1000 UNITS CAPS Take 2 capsules by mouth every morning.     . flecainide (TAMBOCOR) 100 MG tablet TAKE 1 TABLET TWICE DAILY 180 tablet 1  . losartan (COZAAR) 25 MG tablet TAKE 1 TABLET (25 MG TOTAL) BY MOUTH DAILY. 90 tablet 1  . metoprolol succinate (TOPROL-XL) 50 MG 24 hr tablet Take  50 mg by mouth daily. Take with or immediately following a meal.    . Multiple Vitamin (MULTIVITAMIN) tablet Take 1 tablet by mouth daily.    . NON FORMULARY 500 mg.    . potassium citrate (UROCIT-K) 10 MEQ (1080 MG) SR tablet Take 10 mEq by mouth 3 (three) times daily.  11  . rosuvastatin (CRESTOR) 10 MG tablet TAKE 1 TABLET EVERY DAY (ASK FOR FURTHER REFILLS AT YOUR UPCOMING APPOINTMENT) 90 tablet 0  . sertraline (ZOLOFT) 50 MG tablet TAKE 1 TABLET AT BEDTIME 90 tablet 1  . warfarin (COUMADIN) 5 MG tablet TAKE 1 TO 2 TABLETS  DAILY AS DIRECTED BY COUMADIN CLINIC 180 tablet 0   No current facility-administered medications for this visit.     Allergies:   Chocolate; Dextromethorphan;  Flomax [tamsulosin hcl]; Floxin [ofloxacin]; Lisinopril; Tavist allergy [clemastine fumarate]; Amiodarone; and Robitussin cf [pseudoephedrine-dm-gg]    Social History:  The patient  reports that he has never smoked. He has never used smokeless tobacco. He reports that he does not drink alcohol or use drugs.   Family History:  The patient's family history includes Arthritis in his paternal grandmother; Heart disease in his father and paternal grandmother; Hypertension in his mother; Kidney disease in his mother; Leukemia in his father; Nephrolithiasis in his brother and father; Stroke in his mother.    ROS:  Please see the history of present illness.   Otherwise, review of systems are positive for none.   All other systems are reviewed and negative.    PHYSICAL EXAM: VS:  BP 120/68   Pulse (!) 48   Ht 6' 1"  (1.854 m)   Wt 87.8 kg (193 lb 9.6 oz)   BMI 25.54 kg/m  , BMI Body mass index is 25.54 kg/m. GENERAL:  Well appearing HEENT: Pupils equal round and reactive, fundi not visualized, oral mucosa unremarkable NECK:  No jugular venous distention, waveform within normal limits, carotid upstroke brisk and symmetric, no bruits, no thyromegaly LUNGS:  Clear to auscultation bilaterally HEART:  RRR.  PMI not displaced or sustained,S1 and S2 within normal limits, no S3, no S4, no clicks, no rubs, no murmurs ABD:  Flat, positive bowel sounds normal in frequency in pitch, no bruits, no rebound, no guarding, no midline pulsatile mass, no hepatomegaly, no splenomegaly EXT:  2 plus pulses throughout, no edema, no cyanosis no clubbing SKIN:  No rashes no nodules NEURO:  Cranial nerves II through XII grossly intact, motor grossly intact throughout PSYCH:  Cognitively intact, oriented to person place and time   EKG:  EKG is ordered today. The ekg ordered 06/26/16 demonstrates sinus bradycardia rate 52.  06/04/17: Sinus bradycardia.  Rate 48 bpm.  Echo 11/01/11: Study Conclusions  - Left ventricle:  Slight upper septal thickening. The cavity size was normal. The estimated ejection fraction was 60%. Wall motion was normal; there were no regional wall motion abnormalities. - Aortic valve: Mild sclerosis without stenosis. Mild regurgitation. - Aorta: Slight dilitation at sinus of Valsalva. Otherwise, aortic size is normal. - Pulmonary arteries: PA peak pressure: 78m Hg (S).  Myoview 09/27/11: Negative for ischemia.  LVEF 62%.  Recent Labs: 12/06/2016: ALT 27; BUN 25; Creat 0.97; Magnesium 2.2; Potassium 4.7; Sodium 142    Lipid Panel    Component Value Date/Time   CHOL 157 12/06/2016 0950   TRIG 235 (H) 12/06/2016 0950   HDL 36 (L) 12/06/2016 0950   CHOLHDL 4.4 12/06/2016 0950   VLDL 47 (H) 12/06/2016 0950   LDLCALC  74 12/06/2016 0950   LDLDIRECT 154.0 04/08/2015 1157      Wt Readings from Last 3 Encounters:  06/04/17 87.8 kg (193 lb 9.6 oz)  04/05/17 87.8 kg (193 lb 8 oz)  12/06/16 88.9 kg (196 lb)     ASSESSMENT AND PLAN:  # Paroxysmal atrial fibrillation:  # Bradycardia:  Francis Campbell No recurrent episodes.  Continue flecainide, diltiazem, and Coumadin. Reduce metoprolol to 25 mg daily.   This patients CHA2DS2-VASc Score and unadjusted Ischemic Stroke Rate (% per year) is equal to 2.2 % stroke rate/year from a score of 2  Above score calculated as 1 point each if present [CHF, HTN, DM, Vascular=MI/PAD/Aortic Plaque, Age if 65-74, or Male] Above score calculated as 2 points each if present [Age > 75, or Stroke/TIA/TE]  # Hypertension: BP is well-controlled. He has bradycardia so reduce metoprolol as above.  Continue losartan, and diltiazem. He will keep a log of his blood pressure and heart rate at home.  # Hyperlipidemia:  LDL 74 11/2016.  Continue rosuvastatin.    # Aortic valve sclerosis: Francis Campbell' last echo was in 2013, at which time he was noted to have aortic valve sclerosis. he is asymptomatic.   Current medicines are reviewed at length with the  patient today.  The patient does not have concerns regarding medicines.  The following changes have been made:  Reduce metoprolol to 25 mg daily.  Labs/ tests ordered today include:   No orders of the defined types were placed in this encounter.    Disposition:   FU with Kasheena Sambrano C. Oval Linsey, MD, [facility] in 4-6 weeks.    This note was written with the assistance of speech recognition software.  Please excuse any transcriptional errors.  Signed, Tekisha Darcey C. Oval Linsey, MD, [facility]  06/04/2017 9:51 AM    [place]

## 2017-06-04 NOTE — Addendum Note (Signed)
Addended by: Alvina Filbert B on: 06/04/2017 09:55 AM   Modules accepted: Orders

## 2017-06-06 ENCOUNTER — Encounter: Payer: Self-pay | Admitting: Family Medicine

## 2017-06-18 ENCOUNTER — Ambulatory Visit (INDEPENDENT_AMBULATORY_CARE_PROVIDER_SITE_OTHER): Payer: Medicare Other

## 2017-06-18 DIAGNOSIS — Z23 Encounter for immunization: Secondary | ICD-10-CM

## 2017-07-05 DIAGNOSIS — H6063 Unspecified chronic otitis externa, bilateral: Secondary | ICD-10-CM | POA: Diagnosis not present

## 2017-07-07 ENCOUNTER — Other Ambulatory Visit: Payer: Self-pay | Admitting: Cardiovascular Disease

## 2017-07-08 NOTE — Telephone Encounter (Signed)
REFILL 

## 2017-07-08 NOTE — Telephone Encounter (Signed)
Refill Request.  

## 2017-07-22 NOTE — Progress Notes (Signed)
Cardiology Office Note   Date:  07/23/2017   ID:  BODHI, MORADI 1946-08-04, MRN [id number]  PCP:  [name], MD  Cardiologist:   Skeet Latch, MD   Chief Complaint  Patient presents with  . New Patient (Initial Visit)      History of Present Illness: MUBASHIR MALLEK is a 71 y.o. male with paroxysmal atrial fibrillation on coumadin, hypertension, hyperlipidemia, and Crohn's disease who presents for follow up.  Mr. Zick was previously a patient of [name].   Mr. Gilliand was evaluated by [name] for catheter ablation in 2014 and elected to use flecainide as a "pill in the pocket."  He underwent DCCV 04/2013.  He had a negative Myoview 09/2011 and an echo 10/2011 that revealed LVEF 60% with mild AR.  He noted increasing episodes of atrial fibrillation and started on daily flecainide 04/2016.  At his last appointment he was noted to be bradycardic, so metoprolol was reduced.   He continues to keep close records of his blood pressure and heart rate.  His blood pressures been mostly in the 110s-140s with the majority of the reading in the 120s-140s.  His heart rate has been in the 60s.  In the last year he has had one episode of atrial fibrillation that occurred last Friday.  He took an extra 200 mg of flecainide and by the morning he was back in rhythm.  He feels generally unwell when in atrial fibrillation.  He has not been getting any formal exercise but is very active.  He plays with his dogs and cuts his lawn.  He has no chest pain or shortness of breath.  He has not noted any lower extremity edema, orthopnea, or PND.  He continues to avoid chocolate, as this is a trigger for his atrial fibrillation.   Past Medical History:  Diagnosis Date  . Allergy   . Aortic regurgitation   . Arthritis   . Asthma    only as child  . Atrial flutter ([place])    a. Dx 09/2012.  . Crohn disease ([place])    Remotely - in the 1990s  . Esophageal reflux    Remotely  . History of  kidney stones   . Hypercholesteremia   . Hypertension   . Paroxysmal atrial fibrillation (HCC)    Takes PRN flecainide  . 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  [name] TIA (transient ischemic attack)    ~2005 - had visual symptoms, was told by [name] this may have been a TIA but it's unclear  . Valvular heart disease    Mild AI, ASclerosis without AS by echo 10/2011    Past Surgical History:  Procedure Laterality Date  . ARTHOSCOPIC ROTAOR CUFF REPAIR    . [place]  . HERNIA REPAIR  2005  . [place], 2000  . LITHOTRIPSY    . ROTATOR CUFF REPAIR  2000  . TONSILLECTOMY       Current Outpatient Medications  Medication Sig Dispense Refill  . ALPRAZolam (XANAX) 0.25 MG tablet TAKE 1 TABLET AT BEDTIME AS NEEDED FOR ANXIETY 30 tablet 2  . CARTIA XT 240 MG 24 hr capsule TAKE 1 CAPSULE EVERY MORNING. PLEASE KEEP OFFICE VISIT (Patient taking differently: TAKE 1 CAPSULE EVERY MORNING.) 90 capsule 3  . Cholecalciferol (VITAMIN D3) 1000 UNITS CAPS Take 2 capsules by mouth every morning.     [name]  flecainide (TAMBOCOR) 100 MG tablet TAKE 1 TABLET TWICE DAILY 180 tablet 1  . losartan (COZAAR) 25 MG tablet TAKE 1 TABLET EVERY DAY 90 tablet 3  . metoprolol succinate (TOPROL-XL) 25 MG 24 hr tablet Take 1 tablet (25 mg total) by mouth daily. Take with or immediately following a meal. 90 tablet 1  . Multiple Vitamin (MULTIVITAMIN) tablet Take 1 tablet by mouth daily.    . NON FORMULARY 500 mg.    . potassium citrate (UROCIT-K) 10 MEQ (1080 MG) SR tablet Take 10 mEq by mouth 3 (three) times daily.  11  . rosuvastatin (CRESTOR) 10 MG tablet TAKE 1 TABLET EVERY DAY (ASK FOR FURTHER REFILLS AT YOUR UPCOMING APPOINTMENT) (Patient taking differently: TAKE 1 TABLET EVERY DAY) 90 tablet 0  . sertraline (ZOLOFT) 50 MG tablet TAKE 1 TABLET AT BEDTIME 90 tablet 1  . warfarin (COUMADIN) 5 MG tablet TAKE 1 TO 2 TABLETS  DAILY AS DIRECTED BY COUMADIN CLINIC  180 tablet 0   No current facility-administered medications for this visit.     Allergies:   Chocolate; Dextromethorphan; Flomax [tamsulosin hcl]; Floxin [ofloxacin]; Lisinopril; Tavist allergy [clemastine fumarate]; Amiodarone; and Robitussin cf [pseudoephedrine-dm-gg]    Social History:  The patient  reports that  has never smoked. he has never used smokeless tobacco. He reports that he does not drink alcohol or use drugs.   Family History:  The patient's family history includes Arthritis in his paternal grandmother; Heart disease in his father and paternal grandmother; Hypertension in his mother; Kidney disease in his mother; Leukemia in his father; Nephrolithiasis in his brother and father; Stroke in his mother.    ROS:  Please see the history of present illness.   Otherwise, review of systems are positive for none.   All other systems are reviewed and negative.    PHYSICAL EXAM: VS:  BP 134/68   Pulse 63   Ht 6' 1"  (1.854 m)   Wt 88.5 kg (195 lb 3.2 oz)   BMI 25.75 kg/m  , BMI Body mass index is 25.75 kg/m. GENERAL:  Well appearing.  No acute distress. HEENT: Pupils equal round and reactive, fundi not visualized, oral mucosa unremarkable NECK:  No jugular venous distention, waveform within normal limits, carotid upstroke brisk and symmetric, no bruits, no thyromegaly LUNGS:  Clear to auscultation bilaterally HEART:  RRR.  PMI not displaced or sustained,S1 and S2 within normal limits, no S3, no S4, no clicks, no rubs, no murmurs ABD:  Flat, positive bowel sounds normal in frequency in pitch, no bruits, no rebound, no guarding, no midline pulsatile mass, no hepatomegaly, no splenomegaly EXT:  2 plus pulses throughout, no edema, no cyanosis no clubbing SKIN:  No rashes no nodules NEURO:  Cranial nerves II through XII grossly intact, motor grossly intact throughout PSYCH:  Cognitively intact, oriented to person place and time    EKG:  EKG is not ordered today. The ekg ordered  06/26/16 demonstrates sinus bradycardia rate 52.  06/04/17: Sinus bradycardia.  Rate 48 bpm.  Echo 11/01/11: Study Conclusions  - Left ventricle: Slight upper septal thickening. The cavity size was normal. The estimated ejection fraction was 60%. Wall motion was normal; there were no regional wall motion abnormalities. - Aortic valve: Mild sclerosis without stenosis. Mild regurgitation. - Aorta: Slight dilitation at sinus of Valsalva. Otherwise, aortic size is normal. - Pulmonary arteries: PA peak pressure: 41m Hg (S).  Myoview 09/27/11: Negative for ischemia.  LVEF 62%.  Recent Labs: 12/06/2016: ALT 27; BUN  25; Creat 0.97; Magnesium 2.2; Potassium 4.7; Sodium 142    Lipid Panel    Component Value Date/Time   CHOL 157 12/06/2016 0950   TRIG 235 (H) 12/06/2016 0950   HDL 36 (L) 12/06/2016 0950   CHOLHDL 4.4 12/06/2016 0950   VLDL 47 (H) 12/06/2016 0950   LDLCALC 74 12/06/2016 0950   LDLDIRECT 154.0 04/08/2015 1157      Wt Readings from Last 3 Encounters:  07/23/17 88.5 kg (195 lb 3.2 oz)  06/04/17 87.8 kg (193 lb 9.6 oz)  04/05/17 87.8 kg (193 lb 8 oz)     ASSESSMENT AND PLAN:  # Paroxysmal atrial fibrillation:  # Bradycardia:  Overall Mr. Borba has good control of his atrial fibrillation.  He has had one episode in the last year.  It responded to extra flecainide.  Continue flecainide, metoprolol, diltiazem, and Coumadin.  He may be interested in an ablation if he continues to have recurrent episodes.  We will check a CBC, basic metabolic panel, and magnesium level today.  This patients CHA2DS2-VASc Score and unadjusted Ischemic Stroke Rate (% per year) is equal to 2.2 % stroke rate/year from a score of 2  Above score calculated as 1 point each if present [CHF, HTN, DM, Vascular=MI/PAD/Aortic Plaque, Age if 65-74, or Male] Above score calculated as 2 points each if present [Age > 75, or Stroke/TIA/TE]  # Hypertension: Mr. Daisy Lazar brought his home blood  pressure monitor.  It was running 10 points higher than our manual blood pressure check here.  Given this information, his blood pressure at home has been very well-controlled.  We will not make any changes.  Continue diltiazem, losartan, and metoprolol.  # Hyperlipidemia:  LDL 74 11/2016.  Continue rosuvastatin.    # Aortic valve sclerosis: Mr. Kunert' last echo was in 2013, at which time he was noted to have aortic valve sclerosis. he is asymptomatic.   Current medicines are reviewed at length with the patient today.  The patient does not have concerns regarding medicines.  The following changes have been made:  none  Labs/ tests ordered today include:   Orders Placed This Encounter  Procedures  . CBC with Differential/Platelet  . Basic metabolic panel  . Magnesium     Disposition:   FU with Darcy Barbara C. Oval Linsey, MD, [facility] in 6 months    This note was written with the assistance of speech recognition software.  Please excuse any transcriptional errors.  Signed, Leighton Luster C. Oval Linsey, MD, [facility]  07/23/2017 9:23 AM    [place]

## 2017-07-23 ENCOUNTER — Ambulatory Visit (INDEPENDENT_AMBULATORY_CARE_PROVIDER_SITE_OTHER): Payer: Medicare Other | Admitting: Pharmacist Clinician (PhC)/ Clinical Pharmacy Specialist

## 2017-07-23 ENCOUNTER — Encounter: Payer: Self-pay | Admitting: Cardiovascular Disease

## 2017-07-23 ENCOUNTER — Ambulatory Visit (INDEPENDENT_AMBULATORY_CARE_PROVIDER_SITE_OTHER): Payer: Medicare Other | Admitting: Cardiovascular Disease

## 2017-07-23 VITALS — BP 134/68 | HR 63 | Ht 73.0 in | Wt 195.2 lb

## 2017-07-23 DIAGNOSIS — Z5181 Encounter for therapeutic drug level monitoring: Secondary | ICD-10-CM

## 2017-07-23 DIAGNOSIS — I358 Other nonrheumatic aortic valve disorders: Secondary | ICD-10-CM | POA: Diagnosis not present

## 2017-07-23 DIAGNOSIS — E7800 Pure hypercholesterolemia, unspecified: Secondary | ICD-10-CM

## 2017-07-23 DIAGNOSIS — R001 Bradycardia, unspecified: Secondary | ICD-10-CM | POA: Diagnosis not present

## 2017-07-23 DIAGNOSIS — I10 Essential (primary) hypertension: Secondary | ICD-10-CM

## 2017-07-23 DIAGNOSIS — I480 Paroxysmal atrial fibrillation: Secondary | ICD-10-CM | POA: Diagnosis not present

## 2017-07-23 LAB — BASIC METABOLIC PANEL
BUN / CREAT RATIO: 22 (ref 10–24)
BUN: 20 mg/dL (ref 8–27)
CHLORIDE: 100 mmol/L (ref 96–106)
CO2: 26 mmol/L (ref 20–29)
CREATININE: 0.91 mg/dL (ref 0.76–1.27)
Calcium: 9.8 mg/dL (ref 8.6–10.2)
GFR, EST AFRICAN AMERICAN: 98 mL/min/{1.73_m2} (ref >59)
GFR, EST NON AFRICAN AMERICAN: 85 mL/min/{1.73_m2} (ref >59)
GLUCOSE: 85 mg/dL (ref 65–99)
Potassium: 4.5 mmol/L (ref 3.5–5.2)
Sodium: 141 mmol/L (ref 134–144)

## 2017-07-23 LAB — CBC WITH DIFFERENTIAL/PLATELET
BASOS ABS: 0 10*3/uL (ref 0.0–0.2)
Basos: 0 %
EOS (ABSOLUTE): 0.1 10*3/uL (ref 0.0–0.4)
EOS: 1 %
HEMOGLOBIN: 17 g/dL (ref 13.0–17.7)
Hematocrit: 49.5 % (ref 37.5–51.0)
IMMATURE GRANS (ABS): 0 10*3/uL (ref 0.0–0.1)
Immature Granulocytes: 0 %
LYMPHS: 15 %
Lymphocytes Absolute: 1.1 10*3/uL (ref 0.7–3.1)
MCH: 29.6 pg (ref 26.6–33.0)
MCHC: 34.3 g/dL (ref 31.5–35.7)
MCV: 86 fL (ref 79–97)
MONOCYTES: 8 %
Monocytes Absolute: 0.6 10*3/uL (ref 0.1–0.9)
NEUTROS ABS: 5.5 10*3/uL (ref 1.4–7.0)
Neutrophils: 76 %
Platelets: 180 10*3/uL (ref 150–379)
RBC: 5.74 x10E6/uL (ref 4.14–5.80)
RDW: 13.5 % (ref 12.3–15.4)
WBC: 7.3 10*3/uL (ref 3.4–10.8)

## 2017-07-23 LAB — POCT INR: INR: 2.6

## 2017-07-23 LAB — MAGNESIUM: Magnesium: 2.2 mg/dL (ref 1.6–2.3)

## 2017-07-23 NOTE — Patient Instructions (Signed)
Medication Instructions:  Your physician recommends that you continue on your current medications as directed. Please refer to the Current Medication list given to you today.  Labwork: BMET/CBC/MAGNESIUM TODAY   Testing/Procedures: NONE  Follow-Up: Your physician wants you to follow-up in: [place] will receive a reminder letter in the mail two months in advance. If you don't receive a letter, please call our office to schedule the follow-up appointment.  If you need a refill on your cardiac medications before your next appointment, please call your pharmacy.

## 2017-08-04 ENCOUNTER — Other Ambulatory Visit: Payer: Self-pay | Admitting: Family Medicine

## 2017-08-26 ENCOUNTER — Other Ambulatory Visit: Payer: Self-pay | Admitting: Cardiovascular Disease

## 2017-08-29 DIAGNOSIS — H6123 Impacted cerumen, bilateral: Secondary | ICD-10-CM | POA: Diagnosis not present

## 2017-08-29 DIAGNOSIS — H6061 Unspecified chronic otitis externa, right ear: Secondary | ICD-10-CM | POA: Diagnosis not present

## 2017-08-30 ENCOUNTER — Encounter: Payer: Self-pay | Admitting: Family Medicine

## 2017-09-02 ENCOUNTER — Ambulatory Visit (INDEPENDENT_AMBULATORY_CARE_PROVIDER_SITE_OTHER): Payer: Medicare Other | Admitting: Pharmacist Clinician (PhC)/ Clinical Pharmacy Specialist

## 2017-09-02 DIAGNOSIS — Z7901 Long term (current) use of anticoagulants: Secondary | ICD-10-CM | POA: Diagnosis not present

## 2017-09-02 DIAGNOSIS — Z5181 Encounter for therapeutic drug level monitoring: Secondary | ICD-10-CM | POA: Diagnosis not present

## 2017-09-02 DIAGNOSIS — I480 Paroxysmal atrial fibrillation: Secondary | ICD-10-CM

## 2017-09-02 LAB — POCT INR: INR: 3

## 2017-09-12 ENCOUNTER — Emergency Department (HOSPITAL_COMMUNITY)
Admission: EM | Admit: 2017-09-12 | Discharge: 2017-09-12 | Disposition: A | Discharge status: Home / Self Care | Payer: Medicare Other | Attending: Emergency Medicine | Admitting: Emergency Medicine

## 2017-09-12 ENCOUNTER — Encounter (HOSPITAL_COMMUNITY): Payer: Self-pay | Admitting: *Deleted

## 2017-09-12 ENCOUNTER — Emergency Department (HOSPITAL_COMMUNITY): Payer: Medicare Other

## 2017-09-12 DIAGNOSIS — Z79899 Other long term (current) drug therapy: Secondary | ICD-10-CM | POA: Diagnosis not present

## 2017-09-12 DIAGNOSIS — I4891 Unspecified atrial fibrillation: Secondary | ICD-10-CM | POA: Insufficient documentation

## 2017-09-12 DIAGNOSIS — J45909 Unspecified asthma, uncomplicated: Secondary | ICD-10-CM | POA: Insufficient documentation

## 2017-09-12 DIAGNOSIS — K5090 Crohn's disease, unspecified, without complications: Secondary | ICD-10-CM | POA: Insufficient documentation

## 2017-09-12 DIAGNOSIS — R079 Chest pain, unspecified: Secondary | ICD-10-CM | POA: Diagnosis present

## 2017-09-12 DIAGNOSIS — I10 Essential (primary) hypertension: Secondary | ICD-10-CM | POA: Diagnosis not present

## 2017-09-12 DIAGNOSIS — R0789 Other chest pain: Secondary | ICD-10-CM | POA: Diagnosis not present

## 2017-09-12 DIAGNOSIS — R5383 Other fatigue: Secondary | ICD-10-CM | POA: Diagnosis not present

## 2017-09-12 DIAGNOSIS — R0602 Shortness of breath: Secondary | ICD-10-CM | POA: Diagnosis not present

## 2017-09-12 DIAGNOSIS — Z8673 Personal history of transient ischemic attack (TIA), and cerebral infarction without residual deficits: Secondary | ICD-10-CM | POA: Diagnosis not present

## 2017-09-12 DIAGNOSIS — Z7901 Long term (current) use of anticoagulants: Secondary | ICD-10-CM | POA: Insufficient documentation

## 2017-09-12 LAB — CBC WITH DIFFERENTIAL/PLATELET
BASOS ABS: 0 10*3/uL (ref 0.0–0.1)
BASOS PCT: 0 %
EOS ABS: 0.1 10*3/uL (ref 0.0–0.7)
Eosinophils Relative: 1 %
HCT: 49.1 % (ref 39.0–52.0)
HEMOGLOBIN: 16.7 g/dL (ref 13.0–17.0)
Lymphocytes Relative: 18 %
Lymphs Abs: 1.9 10*3/uL (ref 0.7–4.0)
MCH: 29.9 pg (ref 26.0–34.0)
MCHC: 34 g/dL (ref 30.0–36.0)
MCV: 88 fL (ref 78.0–100.0)
Monocytes Absolute: 0.6 10*3/uL (ref 0.1–1.0)
Monocytes Relative: 6 %
NEUTROS PCT: 75 %
Neutro Abs: 7.9 10*3/uL — ABNORMAL HIGH (ref 1.7–7.7)
Platelets: 201 10*3/uL (ref 150–400)
RBC: 5.58 MIL/uL (ref 4.22–5.81)
RDW: 14.2 % (ref 11.5–15.5)
WBC: 10.5 10*3/uL (ref 4.0–10.5)

## 2017-09-12 LAB — I-STAT TROPONIN, ED: TROPONIN I, POC: 0 ng/mL (ref 0.00–0.08)

## 2017-09-12 LAB — BASIC METABOLIC PANEL
ANION GAP: 9 (ref 5–15)
BUN: 25 mg/dL — ABNORMAL HIGH (ref 6–20)
CALCIUM: 9.1 mg/dL (ref 8.9–10.3)
CO2: 27 mmol/L (ref 22–32)
CREATININE: 1.33 mg/dL — AB (ref 0.61–1.24)
Chloride: 104 mmol/L (ref 101–111)
GFR, EST NON AFRICAN AMERICAN: 53 mL/min — AB (ref >60)
Glucose, Bld: 106 mg/dL — ABNORMAL HIGH (ref 65–99)
Potassium: 4.7 mmol/L (ref 3.5–5.1)
SODIUM: 140 mmol/L (ref 135–145)

## 2017-09-12 LAB — PROTIME-INR
INR: 1.88
PROTHROMBIN TIME: 21.4 s — AB (ref 11.4–15.2)

## 2017-09-12 LAB — BRAIN NATRIURETIC PEPTIDE: B NATRIURETIC PEPTIDE 5: 48.2 pg/mL (ref 0.0–100.0)

## 2017-09-12 NOTE — ED Triage Notes (Signed)
pt reports chest pain and tightness that has been intermittent for 5 days. Pt reports feeling tired. Pt reports indigestion. Pt states he has some SOB. Pt has hx of afib and takes blood thinners.

## 2017-09-12 NOTE — ED Notes (Signed)
Pt now in room.

## 2017-09-12 NOTE — ED Notes (Addendum)
Pt not moved to room at this time, pt still in waiting room. Will note when pt is moved to room.

## 2017-09-12 NOTE — ED Provider Notes (Signed)
Patient placed in Quick Look pathway, seen and evaluated for chief complaint of weakness/ sob/chest pressure.  Pertinent H&P findings include sxs x 5 days, waxing and waning, no cp, no fevers or wheezing.  No med changes. Based on initial evaluation, labs are indicated and radiology studies arew indicated.  Patient counseled on process, plan, and necessity for staying for completing the evaluation    Margarita Mail, PA-C 09/12/17 1418    Pattricia Boss, MD 09/16/17 [phone]

## 2017-09-12 NOTE — ED Notes (Signed)
Returned from xray

## 2017-09-12 NOTE — ED Notes (Signed)
Pt changing into gown

## 2017-09-12 NOTE — ED Provider Notes (Signed)
[facility] EMERGENCY DEPARTMENT Provider Note   CSN: [id number] Arrival date & time: 09/12/17  1406     History   Chief Complaint Chief Complaint  Patient presents with  . Chest Pain    HPI Francis Campbell is a 71 y.o. male.  The history is provided by the patient, the spouse and medical records.  Chest Pain   This is a new problem. Episode onset: 4-5 days ago. Episode frequency: intermittent. The problem has not changed since onset.The pain is associated with rest. Pain location: anterior chest b/l. The patient is experiencing no pain. Quality: "tight" The pain does not radiate. Associated symptoms include dizziness (one episode yesterday) and malaise/fatigue. Pertinent negatives include no abdominal pain, no back pain, no cough, no diaphoresis, no fever, no headaches, no irregular heartbeat, no lower extremity edema, no nausea, no near-syncope, no palpitations, no shortness of breath, no syncope and no vomiting. He has tried nothing for the symptoms. Risk factors include male gender and being elderly.  His past medical history is significant for arrhythmia (Afib), hyperlipidemia and hypertension.  Procedure history is positive for echocardiogram.    Past Medical History:  Diagnosis Date  . Allergy   . Aortic regurgitation   . Arthritis   . Asthma    only as child  . Atrial flutter ([place])    a. Dx 09/2012.  . Crohn disease ([place])    Remotely - in the 1990s  . Esophageal reflux    Remotely  . History of kidney stones   . Hypercholesteremia   . Hypertension   . Paroxysmal atrial fibrillation (HCC)    Takes PRN flecainide  . 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  [name] TIA (transient ischemic attack)    ~2005 - had visual symptoms, was told by [name] this may have been a TIA but it's unclear  . Valvular heart disease    Mild AI, ASclerosis without AS by echo 10/2011    Patient Active Problem List   Diagnosis Date Noted   . Chronic insomnia 04/07/2017  . Epigastric discomfort 11/23/2014  . LLQ discomfort 11/23/2014  . Encounter for therapeutic drug monitoring 10/22/2013  . BPH associated with nocturia 08/06/2013  . Atrial flutter ([place]) 11/17/2012  . Anxiety 10/03/2012  . Benign hypertensive heart disease without heart failure 11/28/2011  . Regional enteritis of unspecified site 11/07/2011  . Special screening for malignant neoplasms, colon 11/07/2011  . Duodenitis 11/07/2011  . Major depression 11/06/2011  . Abdominal pain 10/22/2011  . Crohn disease ([place]) 10/22/2011  . Anticoagulated on Coumadin 10/22/2011  . AF (atrial fibrillation) ([place]) 10/22/2011  . Chest pain radiating to arm 09/14/2011  . Leucocytosis 09/09/2011  . Cough, persistent 07/30/2011  . Cough 07/30/2011  . Paroxysmal atrial fibrillation (HCC)   . Hypercholesteremia   . Aortic insufficiency   . Systolic murmur     Past Surgical History:  Procedure Laterality Date  . ARTHOSCOPIC ROTAOR CUFF REPAIR    . CARDIOVERSION N/A 05/08/2013   Procedure: CARDIOVERSION;  Surgeon: Darlin Coco, MD;  Location: [facility] ENDOSCOPY;  Service: Cardiovascular;  Laterality: N/A;  . CYSTOSCOPY/RETROGRADE/URETEROSCOPY/STONE EXTRACTION WITH BASKET Right 02/12/2013   Procedure: CYSTOSCOPY/RETROGRADE/URETEROSCOPY/STONE EXTRACTION WITH BASKET, WITH  STENT PLACEMENT RIGHT URETERAL DILATION;  Surgeon: [name], MD;  Location: [facility] ORS;  Service: Urology;  Laterality: Right;  . [place]  . HERNIA REPAIR  2005  . [place], 2000  .  LITHOTRIPSY    . ROTATOR CUFF REPAIR  2000  . TONSILLECTOMY         Home Medications    Prior to Admission medications   Medication Sig Start Date End Date Taking? Authorizing Provider  CARTIA XT 240 MG 24 hr capsule TAKE 1 CAPSULE EVERY MORNING. PLEASE KEEP OFFICE VISIT Patient taking differently: Take 240 mg by mouth in the morning 06/03/17  Yes Skeet Latch, MD  Cholecalciferol  (VITAMIN D3) 1000 UNITS CAPS Take 2 capsules by mouth every morning.    Yes [provider]  flecainide (TAMBOCOR) 100 MG tablet TAKE 1 TABLET TWICE DAILY 04/01/17  Yes Skeet Latch, MD  losartan (COZAAR) 25 MG tablet TAKE 1 TABLET EVERY DAY 07/08/17  Yes Skeet Latch, MD  metoprolol succinate (TOPROL-XL) 25 MG 24 hr tablet Take 1 tablet (25 mg total) by mouth daily. Take with or immediately following a meal. 06/04/17  Yes Skeet Latch, MD  Multiple Vitamin (MULTIVITAMIN) tablet Take 1 tablet by mouth daily.   Yes [provider]  potassium citrate (UROCIT-K) 10 MEQ (1080 MG) SR tablet Take 10 mEq by mouth 3 (three) times daily. 11/17/15  Yes [provider]  rosuvastatin (CRESTOR) 10 MG tablet TAKE 1 TABLET EVERY DAY (ASK FOR FURTHER REFILLS AT YOUR UPCOMING APPOINTMENT) 08/26/17  Yes Skeet Latch, MD  sertraline (ZOLOFT) 50 MG tablet Take 25 mg by mouth daily.   Yes [provider]  Turmeric 500 MG CAPS Take 500 mg by mouth daily.   Yes [provider]  warfarin (COUMADIN) 5 MG tablet TAKE 1 TO 2 TABLETS  DAILY AS DIRECTED BY COUMADIN CLINIC 05/27/17  Yes Skeet Latch, MD  ALPRAZolam Duanne Moron) 0.25 MG tablet TAKE 1 TABLET AT BEDTIME AS NEEDED FOR ANXIETY Patient not taking: Reported on 09/12/2017 05/28/17   [name], MD  sertraline (ZOLOFT) 50 MG tablet TAKE 1 TABLET AT BEDTIME Patient not taking: Reported on 09/12/2017 08/05/17   [name], MD    Family History Family History  Problem Relation Age of Onset  . Stroke Mother   . Hypertension Mother   . Kidney disease Mother        stones  . Nephrolithiasis Brother   . Arthritis Paternal Grandmother   . Heart disease Paternal Grandmother   . Leukemia Father   . Heart disease Father   . Nephrolithiasis Father   . Heart attack Neg Hx     Social History Social History   Tobacco Use  . Smoking status: Never Smoker  . Smokeless tobacco: Never Used    Substance Use Topics  . Alcohol use: No  . Drug use: No     Allergies   Chocolate; Dextromethorphan; Flomax [tamsulosin hcl]; Floxin [ofloxacin]; Lisinopril; Tavist allergy [clemastine fumarate]; Amiodarone; and Robitussin cf [pseudoephedrine-dm-gg]   Review of Systems Review of Systems  Constitutional: Positive for fatigue and malaise/fatigue. Negative for chills, diaphoresis and fever.  HENT: Negative for ear pain and sore throat.   Eyes: Negative for visual disturbance.  Respiratory: Negative for cough and shortness of breath.   Cardiovascular: Positive for chest pain. Negative for palpitations, syncope and near-syncope.  Gastrointestinal: Negative for abdominal pain, nausea and vomiting.  Genitourinary: Negative for dysuria.  Musculoskeletal: Negative for arthralgias, back pain and neck pain.  Skin: Negative for rash.  Neurological: Positive for dizziness (one episode yesterday). Negative for syncope and headaches.  Psychiatric/Behavioral: Negative for confusion.  All other systems reviewed and are negative.    Physical Exam Updated  Vital Signs BP (!) 149/87 (BP Location: Right Arm)   Pulse 66   Temp 97.7 F (36.5 C) (Oral)   Resp 16   SpO2 96%   Physical Exam  Constitutional: He is oriented to person, place, and time. He appears well-developed and well-nourished.  HENT:  Head: Normocephalic and atraumatic.  Eyes: Conjunctivae are normal.  Neck: Neck supple.  Cardiovascular: Normal rate and regular rhythm.  No murmur heard. Pulmonary/Chest: Effort normal and breath sounds normal. No respiratory distress. He has no wheezes. He has no rales.  Abdominal: Soft. There is no tenderness.  Musculoskeletal: Normal range of motion. He exhibits no edema.  Neurological: He is alert and oriented to person, place, and time.  CN 2-12 grossly intact, 5/5 strength in all 4ext, no focal sensory deficits, able to ambulate in the ED w/o difficulty  Skin: Skin is warm and dry.   Psychiatric: He has a normal mood and affect.  Nursing note and vitals reviewed.    ED Treatments / Results  Labs (all labs ordered are listed, but only abnormal results are displayed) Labs Reviewed  BASIC METABOLIC PANEL - Abnormal; Notable for the following components:      Result Value   Glucose, Bld 106 (*)    BUN 25 (*)    Creatinine, Ser 1.33 (*)    GFR calc non Af Amer 53 (*)    All other components within normal limits  CBC WITH DIFFERENTIAL/PLATELET - Abnormal; Notable for the following components:   Neutro Abs 7.9 (*)    All other components within normal limits  PROTIME-INR - Abnormal; Notable for the following components:   Prothrombin Time 21.4 (*)    All other components within normal limits  BRAIN NATRIURETIC PEPTIDE  I-STAT TROPONIN, ED    EKG  EKG Interpretation  Date/Time:  Thursday September 12 2017 14:11:10 EST Ventricular Rate:  67 PR Interval:  182 QRS Duration: 100 QT Interval:  430 QTC Calculation: 454 R Axis:   68 Text Interpretation:  Normal sinus rhythm Normal ECG No STEMI.  Confirmed by Nanda Quinton [phone]) on 09/12/2017 3:00:49 PM       Radiology Dg Chest 2 View  Result Date: 09/12/2017 CLINICAL DATA:  Weakness and shortness of breath. EXAM: CHEST  2 VIEW COMPARISON:  Chest x-ray dated February 21, 2016. FINDINGS: The cardiomediastinal silhouette is normal in size. Normal pulmonary vascularity. Nodular density at the left lung base. Unchanged calcified granuloma at the right lung base. No focal consolidation, pleural effusion, or pneumothorax. No acute osseous abnormality. IMPRESSION: 1. Small nodular density at the left lung base. Recommend repeat chest x-ray with nipple markers to exclude underlying lung nodule. 2. Otherwise, no acute cardiopulmonary process. Electronically Signed   By: Titus Dubin M.D.   On: 09/12/2017 16:23    Procedures Procedures (including critical care time)  Medications Ordered in ED Medications - No data to  display   Initial Impression / Assessment and Plan / ED Course  I have reviewed the triage vital signs and the nursing notes.  Pertinent labs & imaging results that were available during my care of the patient were reviewed by me and considered in my medical decision making (see chart for details).     Pt with h/o PAfib (on coumadin), HTN, HLD, Crohn's Dz presents with fatigue. Says for the last 4-5 days he has been more tired than normal with intermittent chest tightness (denies any frank pain), and an "awareness" of his breathing but not SOB. Pt also  endorses a single episode of dizziness upon standing yesterday, but says he was seen at another physician's office the other day for an ear issue, and thinks it could be related to that. Endorses some indigestions; denies F/C, HA, cough, CP, SOB, N/V/D, recent illness, or sick contacts. The symptoms are minor, but since they've persisted so long, he decided to come in for evaluation.  VS & exam as above. EKG: NSR @ 67bpm w/normal intervals & no signs of ischemia. CXR w/o evidence of acute process. Labs remarkable for WBC 10.5, Crt 1.33 (0.91 in 11/18), BNP 48, undetectable troponin.  Because of the patient's symptoms unclear, but doubt emergent etiology given history, exam, & workup.  Explained all results to the Pt. Will discharge the Pt home. Recommending follow-up with Cardiology & PCP. ED return precautions provided. Pt acknowledged understanding of, and concurrence with the plan. All questions answered to his satisfaction. In stable condition at the time of discharge.  Final Clinical Impressions(s) / ED Diagnoses   Final diagnoses:  Sensation of chest tightness    ED Discharge Orders    None       Jenny Reichmann, MD 09/12/17 1652    Margette Fast, MD 09/12/17 [phone]

## 2017-09-13 ENCOUNTER — Telehealth: Payer: Self-pay | Admitting: Cardiovascular Disease

## 2017-09-13 NOTE — Telephone Encounter (Signed)
New message  Pt verbalized that he is calling for the rn  He did not want to disclose the reason

## 2017-09-13 NOTE — Telephone Encounter (Signed)
Returned the call to the patient. He stated that he went to the ED yesterday for chest tightness and dizziness. He felt like the dizziness could be ear related. EKG was normal rhythm. He was discharged home with instructions to follow up with cardiology. He was advised that the earliest he could see Dr. Oval Linsey was 3/1. He refused to see a PA. He stated that he would like to talk to Dr. Blenda Mounts nurse about a sooner appointment. Will route to her for her recommendation.

## 2017-09-16 NOTE — Telephone Encounter (Signed)
Patient scheduled for appointment 09/19/17, aware of date and time

## 2017-09-19 ENCOUNTER — Ambulatory Visit (INDEPENDENT_AMBULATORY_CARE_PROVIDER_SITE_OTHER): Payer: Medicare Other | Admitting: Cardiovascular Disease

## 2017-09-19 ENCOUNTER — Encounter: Payer: Self-pay | Admitting: Cardiovascular Disease

## 2017-09-19 VITALS — BP 123/77 | HR 61 | Ht 73.0 in | Wt 196.0 lb

## 2017-09-19 DIAGNOSIS — E7800 Pure hypercholesterolemia, unspecified: Secondary | ICD-10-CM | POA: Diagnosis not present

## 2017-09-19 DIAGNOSIS — Z5181 Encounter for therapeutic drug level monitoring: Secondary | ICD-10-CM | POA: Diagnosis not present

## 2017-09-19 DIAGNOSIS — R079 Chest pain, unspecified: Secondary | ICD-10-CM

## 2017-09-19 DIAGNOSIS — I480 Paroxysmal atrial fibrillation: Secondary | ICD-10-CM | POA: Diagnosis not present

## 2017-09-19 DIAGNOSIS — R972 Elevated prostate specific antigen [PSA]: Secondary | ICD-10-CM | POA: Diagnosis not present

## 2017-09-19 NOTE — Patient Instructions (Signed)
Medication Instructions:  Your physician recommends that you continue on your current medications as directed. Please refer to the Current Medication list given to you today.  Labwork: NONE  Testing/Procedures: Your physician has requested that you have en exercise stress myoview. For further information please visit [email or web address]. Please follow instruction sheet, as given.  Follow-Up: Your physician wants you to follow-up in: 6 MONTH OV UNLESS [place] will receive a reminder letter in the mail two months in advance. If you don't receive a letter, please call our office to schedule the follow-up appointment.  If you need a refill on your cardiac medications before your next appointment, please call your pharmacy.

## 2017-09-19 NOTE — Progress Notes (Signed)
Cardiology Office Note   Date:  09/19/2017   ID:  Francis Campbell, Francis Campbell [birth year], MRN [id number]  PCP:  [name], MD  Cardiologist:   Skeet Latch, MD   Chief Complaint  Patient presents with  . Follow-up     History of Present Illness: Francis Campbell is a 72 y.o. male with paroxysmal atrial fibrillation on coumadin, hypertension, hyperlipidemia, and Crohn's disease who presents for follow up.  Francis Campbell was previously a patient of [name].   Francis Campbell was evaluated by [name] for catheter ablation in 2014 and elected to use flecainide as a "pill in the pocket."  He underwent DCCV 04/2013.  He had a negative Myoview 09/2011 and an echo 10/2011 that revealed LVEF 60% with mild AR.  He noted increasing episodes of atrial fibrillation and started on daily flecainide 04/2016.  He developed bradycardia, so metoprolol was reduced.     Since his last appointment Francis Campbell was seen in the ED 08/2017 with intermittent chest tightness.  His symptoms were felt to be atypical and EKG was unremarkable.   Troponin and chest x-ray are within normal limits.  He was instructed to follow-up with cardiology.  He has been feeling well lately.  However, for the 5 days prior to his emergency department visit he had chest tightness and nausea.  This was there for 75% of the time.  It did not change with exertion.  He did note that he was more short of breath with exertion.  There is no diaphoresis.  He got tired just walking around the house.  During that time his blood pressure was also in the low 100s.  He denies lightheadedness or dizziness.  Since leaving the hospital he has not experienced any recurrent chest pain or pressure.  His blood pressure at home has been stable.  He has not been getting much exercise lately Francis Campbell lower extremity edema, orthopnea, or PND.   Past Medical History:  Diagnosis Date  . Allergy   . Aortic regurgitation   . Arthritis   . Asthma    only as  child  . Atrial flutter ([place])    a. Dx 09/2012.  . Crohn disease ([place])    Remotely - in the 1990s  . Esophageal reflux    Remotely  . History of kidney stones   . Hypercholesteremia   . Hypertension   . Paroxysmal atrial fibrillation (HCC)    Takes PRN flecainide  . 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  [name] TIA (transient ischemic attack)    ~2005 - had visual symptoms, was told by [name] this may have been a TIA but it's unclear  . Valvular heart disease    Mild AI, ASclerosis without AS by echo 10/2011    Past Surgical History:  Procedure Laterality Date  . ARTHOSCOPIC ROTAOR CUFF REPAIR    . CARDIOVERSION N/A 05/08/2013   Procedure: CARDIOVERSION;  Surgeon: Darlin Coco, MD;  Location: [facility] ENDOSCOPY;  Service: Cardiovascular;  Laterality: N/A;  . CYSTOSCOPY/RETROGRADE/URETEROSCOPY/STONE EXTRACTION WITH BASKET Right 02/12/2013   Procedure: CYSTOSCOPY/RETROGRADE/URETEROSCOPY/STONE EXTRACTION WITH BASKET, WITH  STENT PLACEMENT RIGHT URETERAL DILATION;  Surgeon: [name], MD;  Location: [facility] ORS;  Service: Urology;  Laterality: Right;  . [place]  . HERNIA REPAIR  2005  . [place], 2000  . LITHOTRIPSY    . ROTATOR CUFF REPAIR  2000  .  TONSILLECTOMY       Current Outpatient Medications  Medication Sig Dispense Refill  . ALPRAZolam (XANAX) 0.25 MG tablet TAKE 1 TABLET AT BEDTIME AS NEEDED FOR ANXIETY 30 tablet 2  . CARTIA XT 240 MG 24 hr capsule TAKE 1 CAPSULE EVERY MORNING. PLEASE KEEP OFFICE VISIT (Patient taking differently: Take 240 mg by mouth in the morning) 90 capsule 3  . Cholecalciferol (VITAMIN D3) 1000 UNITS CAPS Take 2 capsules by mouth every morning.     . flecainide (TAMBOCOR) 100 MG tablet TAKE 1 TABLET TWICE DAILY 180 tablet 1  . losartan (COZAAR) 25 MG tablet TAKE 1 TABLET EVERY DAY 90 tablet 3  . metoprolol succinate (TOPROL-XL) 25 MG 24 hr tablet Take 1 tablet (25 mg total) by mouth daily. Take  with or immediately following a meal. 90 tablet 1  . Multiple Vitamin (MULTIVITAMIN) tablet Take 1 tablet by mouth daily.    . potassium citrate (UROCIT-K) 10 MEQ (1080 MG) SR tablet Take 10 mEq by mouth 3 (three) times daily.  11  . rosuvastatin (CRESTOR) 10 MG tablet TAKE 1 TABLET EVERY DAY (ASK FOR FURTHER REFILLS AT YOUR UPCOMING APPOINTMENT) 90 tablet 3  . Turmeric 500 MG CAPS Take 500 mg by mouth daily.    [name] warfarin (COUMADIN) 5 MG tablet TAKE 1 TO 2 TABLETS  DAILY AS DIRECTED BY COUMADIN CLINIC 180 tablet 0   No current facility-administered medications for this visit.     Allergies:   Chocolate; Dextromethorphan; Flomax [tamsulosin hcl]; Floxin [ofloxacin]; Lisinopril; Tavist allergy [clemastine fumarate]; Amiodarone; and Robitussin cf [pseudoephedrine-dm-gg]    Social History:  The patient  reports that  has never smoked. he has never used smokeless tobacco. He reports that he does not drink alcohol or use drugs.   Family History:  The patient's family history includes Arthritis in his paternal grandmother; Heart disease in his father and paternal grandmother; Hypertension in his mother; Kidney disease in his mother; Leukemia in his father; Nephrolithiasis in his brother and father; Stroke in his mother.    ROS:  Please see the history of present illness.   Otherwise, review of systems are positive for none.   All other systems are reviewed and negative.    PHYSICAL EXAM: VS:  BP 123/77   Pulse 61   Ht 6' 1"  (1.854 m)   Wt 196 lb (88.9 kg)   BMI 25.86 kg/m  , BMI Body mass index is 25.86 kg/m. GENERAL:  Well appearing HEENT: Pupils equal round and reactive, fundi not visualized, oral mucosa unremarkable NECK:  No jugular venous distention, waveform within normal limits, carotid upstroke brisk and symmetric, no bruits, no thyromegaly LYMPHATICS:  No cervical adenopathy LUNGS:  Clear to auscultation bilaterally HEART:  RRR.  PMI not displaced or sustained,S1 and S2 within  normal limits, no S3, no S4, no clicks, no rubs, II/VI systolic murmur at the LUSB. ABD:  Flat, positive bowel sounds normal in frequency in pitch, no bruits, no rebound, no guarding, no midline pulsatile mass, no hepatomegaly, no splenomegaly EXT:  2 plus pulses throughout, no edema, no cyanosis no clubbing SKIN:  No rashes no nodules NEURO:  Cranial nerves II through XII grossly intact, motor grossly intact throughout PSYCH:  Cognitively intact, oriented to person place and time   EKG:  EKG is not ordered today. The ekg ordered 06/26/16 demonstrates sinus bradycardia rate 52.  06/04/17: Sinus bradycardia.  Rate 48 bpm.  Echo 11/01/11: Study Conclusions  - Left ventricle: Slight upper  septal thickening. The cavity size was normal. The estimated ejection fraction was 60%. Wall motion was normal; there were no regional wall motion abnormalities. - Aortic valve: Mild sclerosis without stenosis. Mild regurgitation. - Aorta: Slight dilitation at sinus of Valsalva. Otherwise, aortic size is normal. - Pulmonary arteries: PA peak pressure: 73m Hg (S).  Myoview 09/27/11: Negative for ischemia.  LVEF 62%.  Recent Labs: 12/06/2016: ALT 27 07/23/2017: Magnesium 2.2 09/12/2017: B Natriuretic Peptide 48.2; BUN 25; Creatinine, Ser 1.33; Hemoglobin 16.7; Platelets 201; Potassium 4.7; Sodium 140    Lipid Panel    Component Value Date/Time   CHOL 157 12/06/2016 0950   TRIG 235 (H) 12/06/2016 0950   HDL 36 (L) 12/06/2016 0950   CHOLHDL 4.4 12/06/2016 0950   VLDL 47 (H) 12/06/2016 0950   LDLCALC 74 12/06/2016 0950   LDLDIRECT 154.0 04/08/2015 1157      Wt Readings from Last 3 Encounters:  09/19/17 196 lb (88.9 kg)  07/23/17 195 lb 3.2 oz (88.5 kg)  06/04/17 193 lb 9.6 oz (87.8 kg)     ASSESSMENT AND PLAN:  # Paroxysmal atrial fibrillation:  # Bradycardia:  No recurrent episodes lately. Continue flecainide, metoprolol, diltiazem, and Coumadin.  He may be interested in an  ablation if he continues to have recurrent episodes.  We will check a CBC, basic metabolic panel, and magnesium level today.  This patients CHA2DS2-VASc Score and unadjusted Ischemic Stroke Rate (% per year) is equal to 2.2 % stroke rate/year from a score of 2  Above score calculated as 1 point each if present [CHF, HTN, DM, Vascular=MI/PAD/Aortic Plaque, Age if 65-74, or Male] Above score calculated as 2 points each if present [Age > 75, or Stroke/TIA/TE]  # Atypical chest pain: We will get an exercise Myoview.  Non-specific ST changes on EKG prohibit ETT.    # Hypertension: BP well-controlled. Continue diltiazem, losartan and metoprolol.  # Hyperlipidemia:  LDL 74 11/2016.  Continue rosuvastatin.    # Aortic valve sclerosis: Francis Campbell last echo was in 2013, at which time he was noted to have aortic valve sclerosis. He is asymptomatic.   Current medicines are reviewed at length with the patient today.  The patient does not have concerns regarding medicines.  The following changes have been made:  none  Labs/ tests ordered today include:   Orders Placed This Encounter  Procedures  . MYOCARDIAL PERFUSION IMAGING     Disposition:   FU with Darnesha Diloreto C. ROval Linsey MD, [facility]    This note was written with the assistance of speech recognition software.  Please excuse any transcriptional errors.  Signed, Nadeem Romanoski C. ROval Linsey MD, [facility] 09/19/2017 5:34 PM    [place]

## 2017-09-20 ENCOUNTER — Telehealth (HOSPITAL_COMMUNITY): Payer: Self-pay

## 2017-09-20 NOTE — Telephone Encounter (Signed)
Encounter complete. 

## 2017-09-23 DIAGNOSIS — N5201 Erectile dysfunction due to arterial insufficiency: Secondary | ICD-10-CM | POA: Diagnosis not present

## 2017-09-23 DIAGNOSIS — R972 Elevated prostate specific antigen [PSA]: Secondary | ICD-10-CM | POA: Diagnosis not present

## 2017-09-23 DIAGNOSIS — N200 Calculus of kidney: Secondary | ICD-10-CM | POA: Diagnosis not present

## 2017-09-25 ENCOUNTER — Ambulatory Visit (HOSPITAL_COMMUNITY)
Admission: RE | Admit: 2017-09-25 | Discharge: 2017-09-25 | Disposition: A | Discharge status: Home / Self Care | Payer: Medicare Other | Source: Ambulatory Visit | Attending: Cardiovascular Disease | Admitting: Cardiovascular Disease

## 2017-09-25 DIAGNOSIS — R079 Chest pain, unspecified: Secondary | ICD-10-CM | POA: Diagnosis not present

## 2017-09-25 LAB — MYOCARDIAL PERFUSION IMAGING
CHL CUP NUCLEAR SSS: 2
CHL RATE OF PERCEIVED EXERTION: 18
CSEPEDS: 15 s
CSEPEW: 10.9 METS
Exercise duration (min): 10 min
LV dias vol: 116 mL (ref 62–150)
LVSYSVOL: 45 mL
MPHR: 150 {beats}/min
NUC STRESS TID: 0.89
Peak HR: 142 {beats}/min
Percent HR: 96 %
Rest HR: 65 {beats}/min
SDS: 2
SRS: 0

## 2017-09-25 MED ORDER — TECHNETIUM TC 99M TETROFOSMIN IV KIT
32.4000 | PACK | Freq: Once | INTRAVENOUS | Status: AC | PRN
  Administered 2017-09-25: 32.4 via INTRAVENOUS
  Filled 2017-09-25: qty 33

## 2017-09-25 MED ORDER — TECHNETIUM TC 99M TETROFOSMIN IV KIT
11.0000 | PACK | Freq: Once | INTRAVENOUS | Status: AC | PRN
  Administered 2017-09-25: 11 via INTRAVENOUS
  Filled 2017-09-25: qty 11

## 2017-09-27 ENCOUNTER — Telehealth: Payer: Self-pay | Admitting: Cardiovascular Disease

## 2017-09-27 NOTE — Telephone Encounter (Signed)
Patient made aware of results and verbalized his understanding.  Notes recorded by Skeet Latch, MD on 09/26/2017 at 6:45 PM EST Normal stress test.

## 2017-09-27 NOTE — Telephone Encounter (Signed)
Follow up      Patient returning call for myocardial perfusion results

## 2017-09-30 ENCOUNTER — Telehealth: Payer: Self-pay | Admitting: Cardiovascular Disease

## 2017-09-30 MED ORDER — WARFARIN SODIUM 5 MG PO TABS: ORAL_TABLET | ORAL | 0 refills | Status: DC

## 2017-09-30 MED ORDER — METOPROLOL SUCCINATE ER 25 MG PO TB24: 25.0000 mg | ORAL_TABLET | Freq: Every day | ORAL | 3 refills | Status: DC

## 2017-09-30 MED ORDER — FLECAINIDE ACETATE 100 MG PO TABS: 100.0000 mg | ORAL_TABLET | Freq: Two times a day (BID) | ORAL | 3 refills | Status: DC

## 2017-09-30 MED ORDER — LOSARTAN POTASSIUM 25 MG PO TABS: 25.0000 mg | ORAL_TABLET | Freq: Every day | ORAL | 3 refills | Status: DC

## 2017-09-30 MED ORDER — ROSUVASTATIN CALCIUM 10 MG PO TABS: ORAL_TABLET | ORAL | 3 refills | Status: DC

## 2017-09-30 MED ORDER — DILTIAZEM HCL ER COATED BEADS 240 MG PO CP24: ORAL_CAPSULE | ORAL | 3 refills | Status: DC

## 2017-09-30 NOTE — Telephone Encounter (Signed)
New message   Patient says he has info regarding his new insurance company and needs a prescription for almost all of his medications Please call

## 2017-09-30 NOTE — Telephone Encounter (Signed)
Left message to call back  

## 2017-09-30 NOTE — Telephone Encounter (Signed)
Spoke with patient and Rx sent to [facility] as requested

## 2017-09-30 NOTE — Telephone Encounter (Signed)
Returning your call. °

## 2017-10-01 ENCOUNTER — Ambulatory Visit: Payer: Medicare Other | Admitting: Cardiovascular Disease

## 2017-10-07 ENCOUNTER — Encounter: Payer: Self-pay | Admitting: Family Medicine

## 2017-10-07 ENCOUNTER — Ambulatory Visit (INDEPENDENT_AMBULATORY_CARE_PROVIDER_SITE_OTHER): Payer: Medicare Other | Admitting: Family Medicine

## 2017-10-07 VITALS — BP 140/60 | HR 52 | Temp 98.0°F | Ht 73.0 in | Wt 200.8 lb

## 2017-10-07 DIAGNOSIS — I4892 Unspecified atrial flutter: Secondary | ICD-10-CM | POA: Diagnosis not present

## 2017-10-07 DIAGNOSIS — E7800 Pure hypercholesterolemia, unspecified: Secondary | ICD-10-CM

## 2017-10-07 DIAGNOSIS — F5104 Psychophysiologic insomnia: Secondary | ICD-10-CM | POA: Diagnosis not present

## 2017-10-07 DIAGNOSIS — I119 Hypertensive heart disease without heart failure: Secondary | ICD-10-CM | POA: Diagnosis not present

## 2017-10-07 LAB — LDL CHOLESTEROL, DIRECT: Direct LDL: 96 mg/dL

## 2017-10-07 LAB — HEPATIC FUNCTION PANEL
ALK PHOS: 61 U/L (ref 39–117)
ALT: 23 U/L (ref 0–53)
AST: 21 U/L (ref 0–37)
Albumin: 4.4 g/dL (ref 3.5–5.2)
BILIRUBIN DIRECT: 0.1 mg/dL (ref 0.0–0.3)
BILIRUBIN TOTAL: 0.4 mg/dL (ref 0.2–1.2)
TOTAL PROTEIN: 6.4 g/dL (ref 6.0–8.3)

## 2017-10-07 LAB — LIPID PANEL
CHOLESTEROL: 157 mg/dL (ref 0–200)
HDL: 32.9 mg/dL — ABNORMAL LOW (ref >39.00)
NonHDL: 124.05
Total CHOL/HDL Ratio: 5
Triglycerides: 292 mg/dL — ABNORMAL HIGH (ref 0.0–149.0)
VLDL: 58.4 mg/dL — AB (ref 0.0–40.0)

## 2017-10-07 MED ORDER — ALPRAZOLAM 0.25 MG PO TABS: ORAL_TABLET | ORAL | 5 refills | Status: DC

## 2017-10-07 NOTE — Patient Instructions (Signed)
Consider trial of Melatonin and start at 1 mg at night and may titrate up slowly to 5 mg .

## 2017-10-07 NOTE — Progress Notes (Signed)
Subjective:     Patient ID: Francis Campbell, male   DOB: [birth year], 72 y.o.   MRN: [id number]  HPI Patient for medical follow-up. He has history of atrial fibrillation, hypertension, BPH, history of depression, hyperlipidemia  He tapered himself off sertraline during the past year and feels his mood has been stable. He has problems with chronic insomnia and takes low-dose alprazolam 0.5 g daily at bedtime. He has not tried melatonin previously.  He is followed by cardiology. Episode of chest pain back in December had subsequent nuclear stress test early this month which was normal. He is followed by urology regularly and had recent PSA there. Medications reviewed. Remains on Coumadin and is followed through Coumadin clinic  Past Medical History:  Diagnosis Date  . Allergy   . Aortic regurgitation   . Arthritis   . Asthma    only as child  . Atrial flutter ([place])    a. Dx 09/2012.  . Crohn disease ([place])    Remotely - in the 1990s  . Esophageal reflux    Remotely  . History of kidney stones   . Hypercholesteremia   . Hypertension   . Paroxysmal atrial fibrillation (HCC)    Takes PRN flecainide  . 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  [name] TIA (transient ischemic attack)    ~2005 - had visual symptoms, was told by [name] this may have been a TIA but it's unclear  . Valvular heart disease    Mild AI, ASclerosis without AS by echo 10/2011   Past Surgical History:  Procedure Laterality Date  . ARTHOSCOPIC ROTAOR CUFF REPAIR    . CARDIOVERSION N/A 05/08/2013   Procedure: CARDIOVERSION;  Surgeon: Darlin Coco, MD;  Location: [facility] ENDOSCOPY;  Service: Cardiovascular;  Laterality: N/A;  . CYSTOSCOPY/RETROGRADE/URETEROSCOPY/STONE EXTRACTION WITH BASKET Right 02/12/2013   Procedure: CYSTOSCOPY/RETROGRADE/URETEROSCOPY/STONE EXTRACTION WITH BASKET, WITH  STENT PLACEMENT RIGHT URETERAL DILATION;  Surgeon: [name], MD;  Location: [facility] ORS;  Service: Urology;   Laterality: Right;  . [place]  . HERNIA REPAIR  2005  . [place], 2000  . LITHOTRIPSY    . ROTATOR CUFF REPAIR  2000  . TONSILLECTOMY      reports that  has never smoked. he has never used smokeless tobacco. He reports that he does not drink alcohol or use drugs. family history includes Arthritis in his paternal grandmother; Heart disease in his father and paternal grandmother; Hypertension in his mother; Kidney disease in his mother; Leukemia in his father; Nephrolithiasis in his brother and father; Stroke in his mother. Allergies  Allergen Reactions  . Chocolate Palpitations  . Dextromethorphan Palpitations  . Flomax [Tamsulosin Hcl] Other (See Comments)    ? Causes A Fib  . Floxin [Ofloxacin] Other (See Comments)    hallucinations  . Lisinopril Cough  . Tavist Allergy [Clemastine Fumarate] Other (See Comments)    A fib worse  . Amiodarone Other (See Comments)    Abn  thyroid  . Robitussin Cf [Pseudoephedrine-Dm-Gg] Palpitations     Review of Systems  Constitutional: Negative for fatigue.  Eyes: Negative for visual disturbance.  Respiratory: Negative for cough, chest tightness and shortness of breath.   Cardiovascular: Negative for chest pain, palpitations and leg swelling.  Endocrine: Negative for polydipsia and polyuria.  Neurological: Negative for dizziness, syncope, weakness, light-headedness and headaches.  Psychiatric/Behavioral: Positive for sleep disturbance. Negative for dysphoric mood.  Objective:   Physical Exam  Constitutional: He is oriented to person, place, and time. He appears well-developed and well-nourished.  HENT:  Right Ear: External ear normal.  Left Ear: External ear normal.  Mouth/Throat: Oropharynx is clear and moist.  Eyes: Pupils are equal, round, and reactive to light.  Neck: Neck supple. No thyromegaly present.  Cardiovascular: Normal rate and regular rhythm.  Pulmonary/Chest: Effort normal and breath  sounds normal. No respiratory distress. He has no wheezes. He has no rales.  Musculoskeletal: He exhibits no edema.  Neurological: He is alert and oriented to person, place, and time.       Assessment:     #1 hypertension  #2 history of atrial flutter/fib. Currently appears to be regular rhythm and rate remains on anticoagulation  #3 hyperlipidemia  #4 history of chronic insomnia    Plan:     -We discussed goal of trying to taper off benzodiazepine if possible -Suggested consider trial of melatonin 1 mg at night may titrate up every few days to 5 mg of needed -Check lipid and hepatic panel -Refilled Xanax if above strategy does not work but would not escalate use  [name] MD [facility]

## 2017-10-08 ENCOUNTER — Encounter: Payer: Self-pay | Admitting: Family Medicine

## 2017-10-08 ENCOUNTER — Encounter: Payer: Self-pay | Admitting: Cardiovascular Disease

## 2017-10-09 ENCOUNTER — Encounter: Payer: Self-pay | Admitting: Cardiovascular Disease

## 2017-10-10 ENCOUNTER — Encounter: Payer: Self-pay | Admitting: Cardiovascular Disease

## 2017-10-14 ENCOUNTER — Ambulatory Visit (INDEPENDENT_AMBULATORY_CARE_PROVIDER_SITE_OTHER): Payer: Medicare Other | Admitting: Pharmacist

## 2017-10-14 DIAGNOSIS — I480 Paroxysmal atrial fibrillation: Secondary | ICD-10-CM

## 2017-10-14 DIAGNOSIS — Z5181 Encounter for therapeutic drug level monitoring: Secondary | ICD-10-CM

## 2017-10-14 LAB — POCT INR: INR: 2.5

## 2017-11-18 ENCOUNTER — Encounter: Payer: Self-pay | Admitting: Family Medicine

## 2017-11-25 ENCOUNTER — Ambulatory Visit (INDEPENDENT_AMBULATORY_CARE_PROVIDER_SITE_OTHER): Payer: Medicare Other | Admitting: Pharmacist

## 2017-11-25 DIAGNOSIS — Z5181 Encounter for therapeutic drug level monitoring: Secondary | ICD-10-CM

## 2017-11-25 DIAGNOSIS — I480 Paroxysmal atrial fibrillation: Secondary | ICD-10-CM

## 2017-11-25 LAB — POCT INR: INR: 2.9

## 2017-12-17 ENCOUNTER — Encounter: Payer: Self-pay | Admitting: Family Medicine

## 2017-12-19 ENCOUNTER — Encounter: Payer: Self-pay | Admitting: Family Medicine

## 2017-12-19 MED ORDER — BENZONATATE 200 MG PO CAPS: 200.0000 mg | ORAL_CAPSULE | Freq: Three times a day (TID) | ORAL | 0 refills | Status: DC | PRN

## 2017-12-26 ENCOUNTER — Encounter: Payer: Self-pay | Admitting: Physician Assistant

## 2017-12-26 ENCOUNTER — Telehealth: Payer: Self-pay | Admitting: Cardiovascular Disease

## 2017-12-26 ENCOUNTER — Telehealth: Payer: Self-pay | Admitting: Gastroenterology

## 2017-12-26 ENCOUNTER — Ambulatory Visit (INDEPENDENT_AMBULATORY_CARE_PROVIDER_SITE_OTHER): Payer: Medicare Other | Admitting: Physician Assistant

## 2017-12-26 VITALS — BP 130/72 | HR 64 | Ht 73.0 in | Wt 196.2 lb

## 2017-12-26 DIAGNOSIS — I351 Nonrheumatic aortic (valve) insufficiency: Secondary | ICD-10-CM

## 2017-12-26 DIAGNOSIS — E785 Hyperlipidemia, unspecified: Secondary | ICD-10-CM | POA: Diagnosis not present

## 2017-12-26 DIAGNOSIS — I480 Paroxysmal atrial fibrillation: Secondary | ICD-10-CM | POA: Diagnosis not present

## 2017-12-26 DIAGNOSIS — I10 Essential (primary) hypertension: Secondary | ICD-10-CM | POA: Diagnosis not present

## 2017-12-26 NOTE — Telephone Encounter (Signed)
CALLED SPOKE TO PATIENT'S WIFE . PATIENT UNABLE TO COME TO THE PHONE. SHE STATES HE WOKE UP THIS MORNING POSSIBLE  AFIB.   APPOINTMENT TODAY  AT 2 PM WITH EXTENDER . WIFE VERBALIZED UNDERSTANDING.

## 2017-12-26 NOTE — Telephone Encounter (Signed)
New Message  Pt states he would like to come to the office for a bp check to make sure he is not in afib. Please call

## 2017-12-26 NOTE — Progress Notes (Signed)
Cardiology Office Note    Date:  12/27/2017   ID:  Campbell, Francis [birth year], MRN [id number]  PCP:  [name], MD  Cardiologist:  Dr. Oval Linsey  Chief Complaint  Patient presents with  . Follow-up    seen for Dr. Oval Linsey.     History of Present Illness:  Francis Campbell is a 72 y.o. male with PMH of TIA in 2005 with no recurrence, valvular heart disease, PAF/atrial flutter on Coumadin, hypertension, hyperlipidemia, history of kidney stone, GERD, Crohn's disease, and history of aortic regurgitation.  Patient used to be followed by Dr. Mare Campbell, later switched to Dr. Oval Linsey after Dr. Mare Campbell retired.  He was evaluated by Dr. Rayann Campbell for ablation in 2014 and elected to use flecainide as a pill in the pocket treatment.  He underwent a DC cardioversion in August 2014.  He had a negative Myoview in January 2013 an echocardiogram in February 2013 showed normal EF mild AR.  In 2017, he started noticing increasing episodes of atrial fibrillation that was started on daily flecainide.  He developed bradycardia, therefore metoprolol was reduced.  He was seen in the emergency room in December 2018 for intermittent chest tightness.  The symptom was felt to be atypical and EKG was unremarkable.  Troponin was normal.  He was seen by Dr. Oval Linsey recently and underwent Myoview on 09/25/2017, he was able to exercise for a total of 10 minutes and 15 seconds under standard Bruce protocol, he achieved 96% of predicted maximal heart rate, EF was 61% and that there was no perfusion defect to suggest ischemia.  Most recent lipid panel obtained on 10/07/2017 showed a total cholesterol 157, HDL 32.9, direct LDL 96, triglyceride 292.  Patient presents today for cardiology office visit.  He apparently woke up around 8:30 AM this morning and had a 1 hour episode of atrial fibrillation.  He has very good cardiac awareness.  He only had 1-2 times of recurrent atrial fibrillation in the last 6 months and  they were both very transient and self terminating.  I do not think we necessarily need to increase his medication.  I did instruct him to take extra dose of 100 mg flecainide on a as needed basis for breakthrough atrial fibrillation.  If he does have more frequent atrial fibrillation, my recommended next step would be to increase the diltiazem CD to 300 mg daily.  I also instructed him to take additional metoprolol succinate on a as needed basis for breakthrough atrial fibrillation as well if additional flecainide failed to bring him out of atrial fibrillation.  Otherwise he denies any chest pain.  He does not have any lower extremity edema, orthopnea or PND.  On physical exam, he continued to have a 2 out of 6 diastolic heart murmur near the right upper sternal border consistent with prior history of aortic regurgitation.  His last echocardiogram was in 2013, he will need another echocardiogram prior to his next visit with Dr. Oval Linsey.   Past Medical History:  Diagnosis Date  . Allergy   . Aortic regurgitation   . Arthritis   . Asthma    only as child  . Atrial flutter ([place])    a. Dx 09/2012.  . Crohn disease ([place])    Remotely - in the 1990s  . Esophageal reflux    Remotely  . History of kidney stones   . Hypercholesteremia   . Hypertension   . Paroxysmal atrial fibrillation ([place])    Takes  PRN flecainide  . 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  Francis Campbell TIA (transient ischemic attack)    ~2005 - had visual symptoms, was told by Dr. Mare Campbell this may have been a TIA but it's unclear  . Valvular heart disease    Mild AI, ASclerosis without AS by echo 10/2011    Past Surgical History:  Procedure Laterality Date  . ARTHOSCOPIC ROTAOR CUFF REPAIR    . CARDIOVERSION N/A 05/08/2013   Procedure: CARDIOVERSION;  Surgeon: Darlin Coco, MD;  Location: [facility] ENDOSCOPY;  Service: Cardiovascular;  Laterality: N/A;  . CYSTOSCOPY/RETROGRADE/URETEROSCOPY/STONE EXTRACTION WITH  BASKET Right 02/12/2013   Procedure: CYSTOSCOPY/RETROGRADE/URETEROSCOPY/STONE EXTRACTION WITH BASKET, WITH  STENT PLACEMENT RIGHT URETERAL DILATION;  Surgeon: [name], MD;  Location: [facility];  Service: Urology;  Laterality: Right;  . [place]  . HERNIA REPAIR  2005  . [place], 2000  . LITHOTRIPSY    . ROTATOR CUFF REPAIR  2000  . TONSILLECTOMY      Current Medications: Outpatient Medications Prior to Visit  Medication Sig Dispense Refill  . ALPRAZolam (XANAX) 0.25 MG tablet TAKE 1 TABLET AT BEDTIME AS NEEDED FOR ANXIETY 30 tablet 5  . Cholecalciferol (VITAMIN D3) 1000 UNITS CAPS Take 2 capsules by mouth every morning.     . diltiazem (CARTIA XT) 240 MG 24 hr capsule Take 240 mg by mouth in the morning 90 capsule 3  . flecainide (TAMBOCOR) 100 MG tablet Take 1 tablet (100 mg total) by mouth 2 (two) times daily. 180 tablet 3  . losartan (COZAAR) 25 MG tablet Take 1 tablet (25 mg total) by mouth daily. 90 tablet 3  . metoprolol succinate (TOPROL-XL) 25 MG 24 hr tablet Take 1 tablet (25 mg total) by mouth daily. Take with or immediately following a meal. 90 tablet 3  . Multiple Vitamin (MULTIVITAMIN) tablet Take 1 tablet by mouth daily.    . potassium citrate (UROCIT-K) 10 MEQ (1080 MG) SR tablet Take 10 mEq by mouth 3 (three) times daily.  11  . rosuvastatin (CRESTOR) 10 MG tablet TAKE 1 TABLET EVERY DAY 90 tablet 3  . Turmeric 500 MG CAPS Take 500 mg by mouth daily.    Francis Campbell warfarin (COUMADIN) 5 MG tablet TAKE 1 TO 2 TABLETS  DAILY AS DIRECTED BY COUMADIN CLINIC 180 tablet 0  . benzonatate (TESSALON) 200 MG capsule Take 1 capsule (200 mg total) by mouth 3 (three) times daily as needed for cough. (Patient not taking: Reported on 12/26/2017) 20 capsule 0   No facility-administered medications prior to visit.      Allergies:   Chocolate; Dextromethorphan; Flomax [tamsulosin hcl]; Floxin [ofloxacin]; Lisinopril; Tavist allergy [clemastine fumarate]; Amiodarone;  and Robitussin cf [pseudoephedrine-dm-gg]   Social History   Socioeconomic History  . Marital status: Married    Spouse name: Not on file  . Number of children: 2  . Years of education: Not on file  . Highest education level: Not on file  Occupational History  . Occupation: TECHNICAL SUP    Employer: [employer]  Social Needs  . Financial resource strain: Not on file  . Food insecurity:    Worry: Not on file    Inability: Not on file  . Transportation needs:    Medical: Not on file    Non-medical: Not on file  Tobacco Use  . Smoking status: Never Smoker  . Smokeless tobacco: Never Used  Substance and Sexual Activity  .  Alcohol use: No  . Drug use: No  . Sexual activity: Yes    Birth control/protection: None  Lifestyle  . Physical activity:    Days per week: Not on file    Minutes per session: Not on file  . Stress: Not on file  Relationships  . Social connections:    Talks on phone: Not on file    Gets together: Not on file    Attends religious service: Not on file    Active member of club or organization: Not on file    Attends meetings of clubs or organizations: Not on file    Relationship status: Not on file  Other Topics Concern  . Not on file  Social History Narrative   Non smoker  No ets            Family History:  The patient's family history includes Arthritis in his paternal grandmother; Heart disease in his father and paternal grandmother; Hypertension in his mother; Kidney disease in his mother; Leukemia in his father; Nephrolithiasis in his brother and father; Stroke in his mother.   ROS:   Please see the history of present illness.    ROS All other systems reviewed and are negative.   PHYSICAL EXAM:   VS:  BP 130/72 (BP Location: Left Arm, Patient Position: Sitting)   Pulse 64   Ht 6' 1"  (1.854 m)   Wt 196 lb 3.2 oz (89 kg)   BMI 25.89 kg/m    GEN: Well nourished, well developed, in no acute distress  HEENT: normal  Neck: no JVD, carotid  bruits, or masses Cardiac: RRR; no rubs, or gallops,no edema  3/6 systolic murmur Respiratory:  clear to auscultation bilaterally, normal work of breathing GI: soft, nontender, nondistended, + BS MS: no deformity or atrophy  Skin: warm and dry, no rash Neuro:  Alert and Oriented x 3, Strength and sensation are intact Psych: euthymic mood, full affect  Wt Readings from Last 3 Encounters:  12/26/17 196 lb 3.2 oz (89 kg)  10/07/17 200 lb 12.8 oz (91.1 kg)  09/25/17 196 lb (88.9 kg)      Studies/Labs Reviewed:   EKG:  EKG is ordered today.  The ekg ordered today demonstrates normal sinus rhythm without significant ST-T wave changes.  Recent Labs: 07/23/2017: Magnesium 2.2 09/12/2017: B Natriuretic Peptide 48.2; BUN 25; Creatinine, Ser 1.33; Hemoglobin 16.7; Platelets 201; Potassium 4.7; Sodium 140 10/07/2017: ALT 23   Lipid Panel    Component Value Date/Time   CHOL 157 10/07/2017 1510   TRIG 292.0 (H) 10/07/2017 1510   HDL 32.90 (L) 10/07/2017 1510   CHOLHDL 5 10/07/2017 1510   VLDL 58.4 (H) 10/07/2017 1510   LDLCALC 74 12/06/2016 0950   LDLDIRECT 96.0 10/07/2017 1510    Additional studies/ records that were reviewed today include:   Echo 11/01/2011 Study Conclusions  - Left ventricle: Slight upper septal thickening. The cavity size was normal. The estimated ejection fraction was 60%. Wall motion was normal; there were no regional wall motion abnormalities. - Aortic valve: Mild sclerosis without stenosis. Mild regurgitation. - Aorta: Slight dilitation at sinus of Valsalva. Otherwise, aortic size is normal. - Pulmonary arteries: PA peak pressure: 45m Hg (S).    Myoview 09/25/2017 Study Highlights    The patient walked for a total of 10 minutes and 15 seconds under standard Bruce protocol treadmill test. The peak heart rate is 142 which is 96% predicted maximal heart rate.  There were no ST or T wave  changes to suggest ischemia.  Nuclear stress EF: 61%.  The left ventricular ejection fraction is normal (55-65%).  This is a low risk study. The study is normal.       ASSESSMENT:    1. Paroxysmal atrial fibrillation (HCC)   2. Aortic valve insufficiency, etiology of cardiac valve disease unspecified   3. Essential hypertension   4. Hyperlipidemia, unspecified hyperlipidemia type      PLAN:  In order of problems listed above:  1. Paroxysmal atrial fibrillation: He had another episode of PAF occurred to this morning, however by the time he arrived in the office, he has converted out of atrial fibrillation.  He has very good cardiac awareness, this morning's episode only lasted about 1 hour.  In the past 6 months, he had only 1-2 episode of atrial fibrillation, therefore after discussing various options we decided to hold off on increasing his medication.  We discussed the potential benefit of AliveCor to document atrial fibrillation. If he does have future recurrence of atrial fibrillation, the next step I would recommend is to increase the diltiazem from 240 mg daily to 300 mg daily.  He is aware that he can take additional 100 mg dose of flecainide for breakthrough atrial fibrillation and may even take additional 25 mg Toprol-XL if additional flecainide did not convert him.  He is still on Coumadin, CHA2DS2-VASC score 2 (age, HTN)  2. Aortic regurgitation: 3/6 murmur at the right upper sternal border consistent with prior history of aortic regurgitation, last echocardiogram was in 2013, he will need a repeat echocardiogram before he sees Dr. Oval Linsey prior to the next visit.  3. Hypertension: Blood pressure stable on current medication  4. Hyperlipidemia: On Crestor 10 mg daily.  Triglycerides remain elevated in January 2019, will need to consider fenofibrate versus Lovaza on the next visit.    Medication Adjustments/Labs and Tests Ordered: Current medicines are reviewed at length with the patient today.  Concerns regarding medicines are  outlined above.  Medication changes, Labs and Tests ordered today are listed in the Patient Instructions below. Patient Instructions  [name], PA-c has recommended making the following medication changes: 1. TAKE an additional Flecainide 100 mg tablet for breakthrough atrial fibrillation - if AFTER 2 HOURS and you are still in atrial fibrillation, take 1 additional Metoprolol.  Your physician has requested that you have an echocardiogram. Echocardiography is a painless test that uses sound waves to create images of your heart. It provides your doctor with information about the size and shape of your heart and how well your heart's chambers and valves are working. This procedure takes approximately one hour. There are no restrictions for this procedure. >>This will be performed at our [facility] location [place], [place] [address] [phone]  Please keep your appointment in June with Dr Feliciana Rossetti, [place], [place]  12/27/2017 2:05 PM    [address], [place], [place]  [address] Phone: [phone]; Fax: [phone]

## 2017-12-26 NOTE — Patient Instructions (Addendum)
[name], PA-c has recommended making the following medication changes: 1. TAKE an additional Flecainide 100 mg tablet for breakthrough atrial fibrillation - if AFTER 2 HOURS and you are still in atrial fibrillation, take 1 additional Metoprolol.  Your physician has requested that you have an echocardiogram. Echocardiography is a painless test that uses sound waves to create images of your heart. It provides your doctor with information about the size and shape of your heart and how well your heart's chambers and valves are working. This procedure takes approximately one hour. There are no restrictions for this procedure. >>This will be performed at our [facility] location [place], [place] [address] [phone]  Please keep your appointment in June with Dr Oval Linsey

## 2017-12-27 ENCOUNTER — Encounter: Payer: Self-pay | Admitting: Physician Assistant

## 2017-12-27 NOTE — Telephone Encounter (Signed)
The pt has not been seen since 2016, he was advised to follow up with PCP for pain around the waist area that radiates to the back.  He will call back if the PCP feels this is GI in nature.

## 2017-12-30 ENCOUNTER — Ambulatory Visit (INDEPENDENT_AMBULATORY_CARE_PROVIDER_SITE_OTHER): Payer: Medicare Other | Admitting: Family Medicine

## 2017-12-30 ENCOUNTER — Encounter: Payer: Self-pay | Admitting: Family Medicine

## 2017-12-30 VITALS — BP 140/90 | HR 58 | Temp 97.7°F | Wt 196.6 lb

## 2017-12-30 DIAGNOSIS — R1032 Left lower quadrant pain: Secondary | ICD-10-CM | POA: Diagnosis not present

## 2017-12-30 LAB — POCT URINALYSIS DIPSTICK
Bilirubin, UA: NEGATIVE
Glucose, UA: NEGATIVE
Ketones, UA: NEGATIVE
LEUKOCYTES UA: NEGATIVE
NITRITE UA: NEGATIVE
PH UA: 6 (ref 5.0–8.0)
PROTEIN UA: NEGATIVE
RBC UA: NEGATIVE
SPEC GRAV UA: 1.01 (ref 1.010–1.025)
Urobilinogen, UA: 0.2 E.U./dL

## 2017-12-30 NOTE — Progress Notes (Signed)
Subjective:     Patient ID: Francis Campbell, male   DOB: January 08, 1946, 72 y.o.   MRN: [id number]  HPI  Patient seen with pain left lower quadrant abdomen for the past several days. About 2 weeks ago he developed some cough which improved with Tessalon. His pain started after that. He does not recall any specific injury. Pain is worse after coughing. He's had previous hernia repair but has not noted any bulging. Also has history of prior kidney stones. He's not had any urinary symptoms. No change in bowel habits. No fevers or chills. No appetite or weight changes.  He had colonoscopy 2013 and no history of diverticular disease. No exacerbating or alleviating factors.  Does have history of kidney stones. Has not noticed any recent gross hematuria. He has pending follow-up with urology soon  Past Medical History:  Diagnosis Date  . Allergy   . Aortic regurgitation   . Arthritis   . Asthma    only as child  . Atrial flutter ([place])    a. Dx 09/2012.  . Crohn disease ([place])    Remotely - in the 1990s  . Esophageal reflux    Remotely  . History of kidney stones   . Hypercholesteremia   . Hypertension   . Paroxysmal atrial fibrillation (HCC)    Takes PRN flecainide  . 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  [name] TIA (transient ischemic attack)    ~2005 - had visual symptoms, was told by [name] this may have been a TIA but it's unclear  . Valvular heart disease    Mild AI, ASclerosis without AS by echo 10/2011   Past Surgical History:  Procedure Laterality Date  . ARTHOSCOPIC ROTAOR CUFF REPAIR    . CARDIOVERSION N/A 05/08/2013   Procedure: CARDIOVERSION;  Surgeon: Darlin Coco, MD;  Location: [facility] ENDOSCOPY;  Service: Cardiovascular;  Laterality: N/A;  . CYSTOSCOPY/RETROGRADE/URETEROSCOPY/STONE EXTRACTION WITH BASKET Right 02/12/2013   Procedure: CYSTOSCOPY/RETROGRADE/URETEROSCOPY/STONE EXTRACTION WITH BASKET, WITH  STENT PLACEMENT RIGHT URETERAL DILATION;   Surgeon: [name], MD;  Location: [facility] ORS;  Service: Urology;  Laterality: Right;  . [place]  . HERNIA REPAIR  2005  . [place], 2000  . LITHOTRIPSY    . ROTATOR CUFF REPAIR  2000  . TONSILLECTOMY      reports that he has never smoked. He has never used smokeless tobacco. He reports that he does not drink alcohol or use drugs. family history includes Arthritis in his paternal grandmother; Heart disease in his father and paternal grandmother; Hypertension in his mother; Kidney disease in his mother; Leukemia in his father; Nephrolithiasis in his brother and father; Stroke in his mother. Allergies  Allergen Reactions  . Chocolate Palpitations  . Dextromethorphan Palpitations  . Flomax [Tamsulosin Hcl] Other (See Comments)    ? Causes A Fib  . Floxin [Ofloxacin] Other (See Comments)    hallucinations  . Lisinopril Cough  . Tavist Allergy [Clemastine Fumarate] Other (See Comments)    A fib worse  . Amiodarone Other (See Comments)    Abn  thyroid  . Robitussin Cf [Pseudoephedrine-Dm-Gg] Palpitations    Review of Systems  Constitutional: Negative for appetite change, chills, fever and unexpected weight change.  Respiratory: Negative for shortness of breath.   Gastrointestinal: Positive for abdominal pain. Negative for abdominal distention, blood in stool, constipation, diarrhea, nausea and vomiting.  Genitourinary: Negative for dysuria, flank pain and hematuria.  Hematological: Negative for adenopathy.       Objective:   Physical Exam  Constitutional: He appears well-developed and well-nourished.  Cardiovascular: Normal rate and regular rhythm.  Pulmonary/Chest: Effort normal and breath sounds normal. No respiratory distress. He has no wheezes. He has no rales.  Abdominal: Soft. Bowel sounds are normal. He exhibits no distension. There is no rebound.  Minimally tender left lower quadrant to deep palpation. No masses. No guarding or rebound.   Genitourinary:  Genitourinary Comments: No evidence for recurrent hernia on exam  Musculoskeletal: He exhibits no edema.       Assessment:     Recent pain left lower quadrant. Question muscular strain. No evidence for recurrent hernia. Doubt kidney stone. No history of diverticular disease-and no history of diverticulosis on previous colonoscopies.    Plan:     -Check urinalysis. If normal observe for now -Follow-up immediately for any fever or any progressive or persistent symptoms  [name] MD [facility] at [facility]  Urinalysis= dipstick normal no blood, leukocytes, or other abnormal findings  [name] MD [facility] at [facility]

## 2017-12-30 NOTE — Patient Instructions (Signed)

## 2018-01-01 DIAGNOSIS — N202 Calculus of kidney with calculus of ureter: Secondary | ICD-10-CM | POA: Diagnosis not present

## 2018-01-03 ENCOUNTER — Ambulatory Visit (HOSPITAL_COMMUNITY): Payer: Medicare Other | Attending: Cardiovascular Disease

## 2018-01-03 ENCOUNTER — Other Ambulatory Visit: Payer: Self-pay

## 2018-01-03 DIAGNOSIS — I10 Essential (primary) hypertension: Secondary | ICD-10-CM | POA: Insufficient documentation

## 2018-01-03 DIAGNOSIS — I4891 Unspecified atrial fibrillation: Secondary | ICD-10-CM | POA: Diagnosis not present

## 2018-01-03 DIAGNOSIS — I4892 Unspecified atrial flutter: Secondary | ICD-10-CM | POA: Insufficient documentation

## 2018-01-03 DIAGNOSIS — I351 Nonrheumatic aortic (valve) insufficiency: Secondary | ICD-10-CM | POA: Diagnosis not present

## 2018-01-03 DIAGNOSIS — E785 Hyperlipidemia, unspecified: Secondary | ICD-10-CM | POA: Insufficient documentation

## 2018-01-03 DIAGNOSIS — G459 Transient cerebral ischemic attack, unspecified: Secondary | ICD-10-CM | POA: Insufficient documentation

## 2018-01-06 ENCOUNTER — Ambulatory Visit (INDEPENDENT_AMBULATORY_CARE_PROVIDER_SITE_OTHER): Payer: Medicare Other | Admitting: Pharmacist

## 2018-01-06 DIAGNOSIS — I480 Paroxysmal atrial fibrillation: Secondary | ICD-10-CM

## 2018-01-06 DIAGNOSIS — Z5181 Encounter for therapeutic drug level monitoring: Secondary | ICD-10-CM | POA: Diagnosis not present

## 2018-01-06 LAB — POCT INR: INR: 3

## 2018-01-08 DIAGNOSIS — R3121 Asymptomatic microscopic hematuria: Secondary | ICD-10-CM | POA: Diagnosis not present

## 2018-01-08 DIAGNOSIS — N202 Calculus of kidney with calculus of ureter: Secondary | ICD-10-CM | POA: Diagnosis not present

## 2018-01-08 DIAGNOSIS — N200 Calculus of kidney: Secondary | ICD-10-CM | POA: Diagnosis not present

## 2018-01-08 NOTE — Progress Notes (Signed)
Mild leakage in the aortic valve, this does not need to be fixed and consistent with degenerative change associated with age. Normal pumping function of heart.

## 2018-01-09 ENCOUNTER — Encounter: Payer: Self-pay | Admitting: Physician Assistant

## 2018-01-13 ENCOUNTER — Encounter: Payer: Self-pay | Admitting: Family Medicine

## 2018-01-14 ENCOUNTER — Ambulatory Visit: Payer: Medicare Other | Admitting: Nurse Practitioner

## 2018-01-21 ENCOUNTER — Encounter: Payer: Self-pay | Admitting: Family Medicine

## 2018-01-24 ENCOUNTER — Encounter: Payer: Self-pay | Admitting: Family Medicine

## 2018-01-24 ENCOUNTER — Ambulatory Visit (INDEPENDENT_AMBULATORY_CARE_PROVIDER_SITE_OTHER): Payer: Medicare Other | Admitting: Family Medicine

## 2018-01-24 VITALS — BP 120/78 | HR 64 | Temp 98.4°F | Wt 196.2 lb

## 2018-01-24 DIAGNOSIS — R1032 Left lower quadrant pain: Secondary | ICD-10-CM

## 2018-01-24 NOTE — Patient Instructions (Signed)
Follow up for any progressive pain, fever, stool changes, or other new concerns.

## 2018-01-24 NOTE — Progress Notes (Signed)
Subjective:     Patient ID: Francis Campbell, male   DOB: 11-25-1945, 72 y.o.   MRN: [id number]  HPI Patient seen with some left lower quadrant pain which has been relatively mild over the past 2 months. He had recent CT through urology April 24 and had called earlier wanting to discuss results. He does have history of sigmoid diverticulosis by previous colonoscopy. CT was reviewed. He had evidence for bilateral nephrolithiasis but no evidence for ureteral calculus or hydronephrosis. He had some bilateral renal cysts which are chronic. Incidental note of cholelithiasis but no acute gallbladder wall thickening or other abnormality. There was mention of some mild sigmoid diverticulosis but no diverticulitis changes.  Patient has not any fevers or chills. Appetite and weight are stable. Pain sometimes worse with movement. No dysuria. No gross hematuria.  Colonoscopy is up to date (2013) with no polyps.  Past Medical History:  Diagnosis Date  . Allergy   . Aortic regurgitation   . Arthritis   . Asthma    only as child  . Atrial flutter ([place])    a. Dx 09/2012.  . Crohn disease ([place])    Remotely - in the 1990s  . Esophageal reflux    Remotely  . History of kidney stones   . Hypercholesteremia   . Hypertension   . Paroxysmal atrial fibrillation (HCC)    Takes PRN flecainide  . 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  [name] TIA (transient ischemic attack)    ~2005 - had visual symptoms, was told by [name] this may have been a TIA but it's unclear  . Valvular heart disease    Mild AI, ASclerosis without AS by echo 10/2011   Past Surgical History:  Procedure Laterality Date  . ARTHOSCOPIC ROTAOR CUFF REPAIR    . CARDIOVERSION N/A 05/08/2013   Procedure: CARDIOVERSION;  Surgeon: Darlin Coco, MD;  Location: [facility] ENDOSCOPY;  Service: Cardiovascular;  Laterality: N/A;  . CYSTOSCOPY/RETROGRADE/URETEROSCOPY/STONE EXTRACTION WITH BASKET Right 02/12/2013    Procedure: CYSTOSCOPY/RETROGRADE/URETEROSCOPY/STONE EXTRACTION WITH BASKET, WITH  STENT PLACEMENT RIGHT URETERAL DILATION;  Surgeon: [name], MD;  Location: [facility] ORS;  Service: Urology;  Laterality: Right;  . [place]  . HERNIA REPAIR  2005  . [place], 2000  . LITHOTRIPSY    . ROTATOR CUFF REPAIR  2000  . TONSILLECTOMY      reports that he has never smoked. He has never used smokeless tobacco. He reports that he does not drink alcohol or use drugs. family history includes Arthritis in his paternal grandmother; Heart disease in his father and paternal grandmother; Hypertension in his mother; Kidney disease in his mother; Leukemia in his father; Nephrolithiasis in his brother and father; Stroke in his mother. Allergies  Allergen Reactions  . Chocolate Palpitations  . Dextromethorphan Palpitations  . Flomax [Tamsulosin Hcl] Other (See Comments)    ? Causes A Fib  . Floxin [Ofloxacin] Other (See Comments)    hallucinations  . Lisinopril Cough  . Tavist Allergy [Clemastine Fumarate] Other (See Comments)    A fib worse  . Amiodarone Other (See Comments)    Abn  thyroid  . Robitussin Cf [Pseudoephedrine-Dm-Gg] Palpitations     Review of Systems  Constitutional: Negative for appetite change, chills, fever and unexpected weight change.  Respiratory: Negative for shortness of breath.   Cardiovascular: Negative for chest pain.  Gastrointestinal: Positive for abdominal pain. Negative for blood in stool,  constipation, diarrhea and nausea.  Genitourinary: Negative for dysuria and hematuria.       Objective:   Physical Exam  Constitutional: He appears well-developed and well-nourished.  Cardiovascular: Normal rate and regular rhythm.  Pulmonary/Chest: Effort normal and breath sounds normal.  Abdominal: Soft. Bowel sounds are normal.  Minimal tenderness left lower quadrant to deep palpation. No guarding or rebound.  Musculoskeletal: He exhibits no edema.   Skin: No rash noted.       Assessment:     Left lower quadrant abdominal pain couple months duration. Recent CT abdomen pelvis no acute abnormalities. He has stones in both kidneys but no ureteral stones and no hydronephrosis. Sounds more musculoskeletal. Does have sigmoid diverticulosis but no diverticulitis changes    Plan:     -Observation for now. Follow-up promptly for any fever, worsening pain, or other concerns -continue with usual daily activities as tolerated.  -Avoid NSAIDS with his coumadin use.  [name] MD [facility]

## 2018-01-29 ENCOUNTER — Telehealth: Payer: Self-pay | Admitting: *Deleted

## 2018-01-29 DIAGNOSIS — R1032 Left lower quadrant pain: Secondary | ICD-10-CM

## 2018-01-29 NOTE — Telephone Encounter (Signed)
Make sure no fever.  If none, I would get him back in to see GI since he already had recent CT abd/pelvis with no acute abnormality.

## 2018-01-29 NOTE — Telephone Encounter (Signed)
Left message on machine for patient to return our call.  CRM created

## 2018-01-29 NOTE — Telephone Encounter (Signed)
Patient states his left lower abdominal pain has increased and now has a loss of appetite.

## 2018-01-30 NOTE — Telephone Encounter (Signed)
Pt. Reports he has not had a fever and would like a referral to GI. States he has seen Dr. Ranelle Oyster in the past. Please advise pt. When appointment is made.

## 2018-01-31 NOTE — Addendum Note (Signed)
Addended by: Agnes Lawrence on: 01/31/2018 08:32 AM   Modules accepted: Orders

## 2018-01-31 NOTE — Telephone Encounter (Signed)
OK to refer back to GI.  Make sure he is having no urinary symptoms such as burning.  Recent CT abdomen and pelvis unremarkable.

## 2018-01-31 NOTE — Telephone Encounter (Signed)
I called the pt and informed him of the message below.  He denies any burning or other symptoms. Patient is aware the referral was placed and someone will call with appt info.

## 2018-02-26 ENCOUNTER — Ambulatory Visit (INDEPENDENT_AMBULATORY_CARE_PROVIDER_SITE_OTHER): Payer: Medicare Other | Admitting: Nurse Practitioner

## 2018-02-26 ENCOUNTER — Encounter: Payer: Self-pay | Admitting: Nurse Practitioner

## 2018-02-26 VITALS — BP 144/64 | HR 64 | Ht 73.0 in | Wt 197.0 lb

## 2018-02-26 DIAGNOSIS — R1032 Left lower quadrant pain: Secondary | ICD-10-CM

## 2018-02-26 MED ORDER — LIDOCAINE 5 % EX PTCH: 1.0000 | MEDICATED_PATCH | CUTANEOUS | 0 refills | Status: DC

## 2018-02-26 NOTE — Patient Instructions (Signed)
We have sent the following medications to your pharmacy for you to pick up at your convenience: Lidocaine Patches-as directed  If you are age 71 or older, your body mass index should be between 23-30. Your Body mass index is 25.99 kg/m. If this is out of the aforementioned range listed, please consider follow up with your Primary Care Provider.  If you are age 8 or younger, your body mass index should be between 19-25. Your Body mass index is 25.99 kg/m. If this is out of the aformentioned range listed, please consider follow up with your Primary Care Provider.

## 2018-02-26 NOTE — Progress Notes (Signed)
IMPRESSION and PLAN:    #1. 72 yo male with 2 months hx of constant LLQ discomfort unrelated to eating / defecation and actually improved with repositioning sometimes. Discomfort started after vigorous coughing episodes. We evaluated him in 2016 for same sx, CT scan unremarkable. suspect musculoskeletal / neuropathic origin of pain. The pain is very localized to small area in LLQ which the patient nor I can even locate on exam to today.  -no urinary sx. No bowel changes, no fevers. Weight stable. Patient looks well.  -trial of Lidoderm patches for next 7 days then call us with update.  If patches too costly he will call back. At that point >>>> trial of NSAIDS and muscle relaxer  #2. Hx of kidney stones, saw Urologist but pain not felt to be Urologic in nature.  -Patient wonders if coughing caused problem with abdominal mesh. I don't think this is the case but if discomfort continues he can get surgical opinion.      HPI:    Chief Complaint:  LLQ pain   Patient is a 72 yo male with history of A. fib and valvular heart disease n chronic Coumadin. Previously followed by Dr. Sharlett Iles, now known to Dr. Ardis Hughs. He has a hx of erosive gastritis. He had a normal screening colonoscopy  In Feb 2013, recommended to have repeat at 10 years. He was last seen in 2016 for evaluation of LLQ pain.  Pain was largely positional but CT scan obtained and showed only small non-obstructive kidney stones.   2 months ago patient developed left lower quadrant pain after coughing vigorously.  For the first few days after pain started it was 7-8 on the pain scale.  Over the last few weeks pain is range between 1 and 3 on the pain scale but it is constant.  He has a constant sensation that something is pushing into the left lower quadrant almost like a belt that is twisted in one area.  The pain is not exacerbated by movement.  Sometimes repositioning himself with actually relieve the discomfort.  There is no  relationship between the discomfort and eating nor defecation.  Bowel movements are normal.  No urinary symptoms.  No fevers, no nausea or vomiting.   Review of systems:     No chest pain, no SOB, no fevers, no urinary sx   Past Medical History:  Diagnosis Date  . Allergy   . Aortic regurgitation   . Arthritis   . Asthma    only as child  . Atrial flutter ([place])    a. Dx 09/2012.  . Crohn disease ([place])    Remotely - in the 1990s  . Esophageal reflux    Remotely  . History of kidney stones   . Hypercholesteremia   . Hypertension   . Paroxysmal atrial fibrillation (HCC)    Takes PRN flecainide  . 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  [name] TIA (transient ischemic attack)    ~2005 - had visual symptoms, was told by [name] this may have been a TIA but it's unclear  . Valvular heart disease    Mild AI, ASclerosis without AS by echo 10/2011    Patient's surgical history, family medical history, social history, medications and allergies were all reviewed in Epic   Creatinine clearance cannot be calculated (Patient's most recent lab result is older than the maximum 21 days allowed.)   Physical Exam:     Wt  197 lb (89.4 kg)   BMI 25.99 kg/m   GENERAL:  Pleasant male in NAD PSYCH: : Cooperative, normal affect EENT:  conjunctiva pink, mucous membranes moist, neck supple without masses CARDIAC:  RRR, no murmur heard, no peripheral edema PULM: Normal respiratory effort, lungs CTA bilaterally, no wheezing ABDOMEN:  Nondistended, soft, nontender. No obvious masses, no hepatomegaly,  normal bowel sounds. Negative Carnett's sign SKIN:  turgor, no lesions seen Musculoskeletal:  Normal muscle tone, normal strength NEURO: Alert and oriented x 3, no focal neurologic deficits   Tye Savoy , NP 02/26/2018, 8:39 AM

## 2018-02-27 ENCOUNTER — Encounter: Payer: Self-pay | Admitting: Nurse Practitioner

## 2018-02-27 NOTE — Progress Notes (Signed)
I agree with the above note, plan 

## 2018-03-03 ENCOUNTER — Ambulatory Visit (INDEPENDENT_AMBULATORY_CARE_PROVIDER_SITE_OTHER): Payer: Medicare Other | Admitting: Pharmacist Clinician (PhC)/ Clinical Pharmacy Specialist

## 2018-03-03 ENCOUNTER — Encounter: Payer: Self-pay | Admitting: Cardiovascular Disease

## 2018-03-03 ENCOUNTER — Ambulatory Visit (INDEPENDENT_AMBULATORY_CARE_PROVIDER_SITE_OTHER): Payer: Medicare Other | Admitting: Cardiovascular Disease

## 2018-03-03 VITALS — BP 130/74 | HR 62 | Ht 73.0 in | Wt 197.0 lb

## 2018-03-03 DIAGNOSIS — I351 Nonrheumatic aortic (valve) insufficiency: Secondary | ICD-10-CM | POA: Diagnosis not present

## 2018-03-03 DIAGNOSIS — I10 Essential (primary) hypertension: Secondary | ICD-10-CM | POA: Diagnosis not present

## 2018-03-03 DIAGNOSIS — E785 Hyperlipidemia, unspecified: Secondary | ICD-10-CM | POA: Diagnosis not present

## 2018-03-03 DIAGNOSIS — I480 Paroxysmal atrial fibrillation: Secondary | ICD-10-CM | POA: Diagnosis not present

## 2018-03-03 DIAGNOSIS — Z5181 Encounter for therapeutic drug level monitoring: Secondary | ICD-10-CM

## 2018-03-03 LAB — POCT INR: INR: 2.8 (ref 2.0–3.0)

## 2018-03-03 NOTE — Progress Notes (Signed)
Cardiology Office Note   Date:  03/04/2018   ID:  Francis Campbell, Francis Campbell Dec 01, 1945, MRN [id number]  PCP:  [name], MD  Cardiologist:   Skeet Latch, MD   No chief complaint on file.    History of Present Illness: Francis Campbell is a 72 y.o. male with paroxysmal atrial fibrillation on coumadin, hypertension, hyperlipidemia, and Crohn's disease who presents for follow up.  Francis Campbell was previously a patient of [name].   Francis Campbell was evaluated by [name] for catheter ablation in 2014 and elected to use flecainide as a "pill in the pocket."  Francis Campbell underwent DCCV 04/2013.  Francis Campbell had a negative Myoview 09/2011 and an echo 10/2011 that revealed LVEF 60% with mild AR.  Francis Campbell noted increasing episodes of atrial fibrillation and started on daily flecainide 04/2016.  Francis Campbell developed bradycardia, so metoprolol was reduced.     Francis Campbell was seen in the ED 08/2017 with intermittent chest tightness.  His symptoms were felt to be atypical and EKG was unremarkable.   Troponin and chest x-ray are within normal limits.  Francis Campbell followed up in clinic and was referred for an exercise Myoview 09/2017 that was negative for ischemia.  Francis Campbell saw [name], [place], on 12/2017 after a one hour episode of atrial fibrillation.  Francis Campbell was instructed to take an extra dose of flecainide as needed.  Francis Campbell hasn't had any recurrent atrial fibrillation since that time.  Francis Campbell is overall well and without complaint.  Francis Campbell is active but isn't getting any formal exercise.  Francis Campbell denies LE edema, orthopnea or PND.  Since his last appointment Francis Campbell had a severe cough and then kidney stones.  Francis Campbell pulled a muscle in his LLQ and has constant pain that is slowly getting better over the last 2 months.    Past Medical History:  Diagnosis Date  . Allergy   . Aortic regurgitation   . Arthritis   . Asthma    only as child  . Atrial flutter ([place])    a. Dx 09/2012.  . Crohn disease ([place])    Remotely - in the 1990s  . Esophageal reflux    Remotely  .  History of kidney stones   . Hypercholesteremia   . Hypertension   . Paroxysmal atrial fibrillation (HCC)    Takes PRN flecainide  . 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  [name] TIA (transient ischemic attack)    ~2005 - had visual symptoms, was told by [name] this may have been a TIA but it's unclear  . Valvular heart disease    Mild AI, ASclerosis without AS by echo 10/2011    Past Surgical History:  Procedure Laterality Date  . CARDIOVERSION N/A 05/08/2013   Procedure: CARDIOVERSION;  Surgeon: Darlin Coco, MD;  Location: [facility] ENDOSCOPY;  Service: Cardiovascular;  Laterality: N/A;  . CYSTOSCOPY/RETROGRADE/URETEROSCOPY/STONE EXTRACTION WITH BASKET Right 02/12/2013   Procedure: CYSTOSCOPY/RETROGRADE/URETEROSCOPY/STONE EXTRACTION WITH BASKET, WITH  STENT PLACEMENT RIGHT URETERAL DILATION;  Surgeon: [name], MD;  Location: [facility] ORS;  Service: Urology;  Laterality: Right;  . EYE SURGERY Bilateral 1950, 1960, 1964   left, right, left  . HERNIA REPAIR  2005  . KNEE SURGERY Bilateral 1990, 2000  . LITHOTRIPSY    . ROTATOR CUFF REPAIR Left 2000  . TONSILLECTOMY       Current Outpatient Medications  Medication Sig Dispense Refill  . ALPRAZolam (XANAX) 0.25 MG tablet TAKE 1 TABLET  AT BEDTIME AS NEEDED FOR ANXIETY 30 tablet 5  . Cholecalciferol (VITAMIN D3) 1000 UNITS CAPS Take 2 capsules by mouth every morning.     . diltiazem (CARTIA XT) 240 MG 24 hr capsule Take 240 mg by mouth in the morning 90 capsule 3  . flecainide (TAMBOCOR) 100 MG tablet Take 1 tablet (100 mg total) by mouth 2 (two) times daily. 180 tablet 3  . lidocaine (LIDODERM) 5 % Place 1 patch onto the skin daily. Remove & Discard patch within 12 hours or as directed by MD 14 patch 0  . losartan (COZAAR) 25 MG tablet Take 1 tablet (25 mg total) by mouth daily. 90 tablet 3  . metoprolol succinate (TOPROL-XL) 25 MG 24 hr tablet Take 1 tablet (25 mg total) by mouth daily. Take with or  immediately following a meal. 90 tablet 3  . Multiple Vitamin (MULTIVITAMIN) tablet Take 1 tablet by mouth daily.    . potassium citrate (UROCIT-K) 10 MEQ (1080 MG) SR tablet Take 10 mEq by mouth 3 (three) times daily.  11  . rosuvastatin (CRESTOR) 10 MG tablet TAKE 1 TABLET EVERY DAY 90 tablet 3  . Turmeric 500 MG CAPS Take 500 mg by mouth daily.    [name] warfarin (COUMADIN) 5 MG tablet TAKE 1 TO 2 TABLETS  DAILY AS DIRECTED BY COUMADIN CLINIC 180 tablet 0   No current facility-administered medications for this visit.     Allergies:   Chocolate; Dextromethorphan; Flomax [tamsulosin hcl]; Floxin [ofloxacin]; Lisinopril; Tavist allergy [clemastine fumarate]; Amiodarone; and Robitussin cf [pseudoephedrine-dm-gg]    Social History:  The patient  reports that Francis Campbell has never smoked. Francis Campbell has never used smokeless tobacco. Francis Campbell reports that Francis Campbell does not drink alcohol or use drugs.   Family History:  The patient's family history includes Appendicitis in his son; Arthritis in his paternal grandmother; Heart disease in his father and paternal grandmother; Hypertension in his mother; Kidney cancer in his son; Kidney disease in his mother; Leukemia in his father; Nephrolithiasis in his brother and father; Stroke in his mother.    ROS:  Please see the history of present illness.   Otherwise, review of systems are positive for none.   All other systems are reviewed and negative.    PHYSICAL EXAM: VS:  BP 130/74   Pulse 62   Ht 6' 1"  (1.854 m)   Wt 197 lb (89.4 kg)   BMI 25.99 kg/m  , BMI Body mass index is 25.99 kg/m. GENERAL:  Well appearing HEENT: Pupils equal round and reactive, fundi not visualized, oral mucosa unremarkable NECK:  No jugular venous distention, waveform within normal limits, carotid upstroke brisk and symmetric, no bruits, no thyromegaly LYMPHATICS:  No cervical adenopathy LUNGS:  Clear to auscultation bilaterally HEART:  RRR.  PMI not displaced or sustained,S1 and S2 within normal  limits, no S3, no S4, no clicks, no rubs, II/VI systolic murmur at the LUSB. ABD:  Flat, positive bowel sounds normal in frequency in pitch, no bruits, no rebound, no guarding, no midline pulsatile mass, no hepatomegaly, no splenomegaly EXT:  2 plus pulses throughout, no edema, no cyanosis no clubbing SKIN:  No rashes no nodules NEURO:  Cranial nerves II through XII grossly intact, motor grossly intact throughout PSYCH:  Cognitively intact, oriented to person place and time   EKG:  EKG is not ordered today. The ekg ordered 06/26/16 demonstrates sinus bradycardia rate 52.  06/04/17: Sinus bradycardia.  Rate 48 bpm.  Echo 01/03/18: Study Conclusions  -  Left ventricle: The cavity size was normal. Wall thickness was   increased in a pattern of mild LVH. Systolic function was normal.   The estimated ejection fraction was in the range of 60% to 65%.   Wall motion was normal; there were no regional wall motion   abnormalities. Left ventricular diastolic function parameters   were normal. - Aortic valve: Dilated aortic sinus. There was mild regurgitation. - Atrial septum: No defect or patent foramen ovale was identified. - Pulmonary arteries: PA peak pressure: 31 mm Hg (S).  Exercise Myoview 09/25/17:   The patient walked for a total of 10 minutes and 15 seconds under standard Bruce protocol treadmill test. The peak heart rate is 142 which is 96% predicted maximal heart rate.  There were no ST or T wave changes to suggest ischemia.  Nuclear stress EF: 61%. The left ventricular ejection fraction is normal (55-65%).  This is a low risk study. The study is normal.  Recent Labs: 07/23/2017: Magnesium 2.2 09/12/2017: B Natriuretic Peptide 48.2; BUN 25; Creatinine, Ser 1.33; Hemoglobin 16.7; Platelets 201; Potassium 4.7; Sodium 140 10/07/2017: ALT 23    Lipid Panel    Component Value Date/Time   CHOL 157 10/07/2017 1510   TRIG 292.0 (H) 10/07/2017 1510   HDL 32.90 (L) 10/07/2017 1510    CHOLHDL 5 10/07/2017 1510   VLDL 58.4 (H) 10/07/2017 1510   LDLCALC 74 12/06/2016 0950   LDLDIRECT 96.0 10/07/2017 1510      Wt Readings from Last 3 Encounters:  03/03/18 197 lb (89.4 kg)  02/26/18 197 lb (89.4 kg)  01/24/18 196 lb 3.2 oz (89 kg)     ASSESSMENT AND PLAN:  # Paroxysmal atrial fibrillation:  # Bradycardia:  No recurrent episodes lately. Francis Campbell is doing well on flecainide, metoprolol, diltiazem, and Coumadin.  Francis Campbell may be interested in an ablation if Francis Campbell continues to have recurrent episodes.    This patients CHA2DS2-VASc Score and unadjusted Ischemic Stroke Rate (% per year) is equal to 2.2 % stroke rate/year from a score of 2  Above score calculated as 1 point each if present [CHF, HTN, DM, Vascular=MI/PAD/Aortic Plaque, Age if 65-74, or Male] Above score calculated as 2 points each if present [Age > 75, or Stroke/TIA/TE]  # Atypical chest pain: Resolved. Exercise Myoview was negative for ischemia.    # Hypertension: BP well-controlled. Continue diltiazem, losartan and metoprolol.  # Hyperlipidemia:  LDL 74 11/2016.  Continue rosuvastatin.    # Aortic valve sclerosis: Unchanged on echo 12/2017.    Current medicines are reviewed at length with the patient today.  The patient does not have concerns regarding medicines.  The following changes have been made:  none  Labs/ tests ordered today include:   No orders of the defined types were placed in this encounter.    Disposition:   FU with Taneil Lazarus C. Oval Linsey, MD, [facility] in 6 months      Signed, Rob Mciver C. Oval Linsey, MD, [facility]  03/04/2018 8:49 AM    [facility]

## 2018-03-03 NOTE — Patient Instructions (Signed)
Medication Instructions:  Your physician recommends that you continue on your current medications as directed. Please refer to the Current Medication list given to you today.  Labwork: none  Testing/Procedures: none  Follow-Up: Your physician wants you to follow-up in: 6 month  You will receive a reminder letter in the mail two months in advance. If you don't receive a letter, please call our office to schedule the follow-up appointment.  If you need a refill on your cardiac medications before your next appointment, please call your pharmacy.

## 2018-03-04 ENCOUNTER — Encounter: Payer: Self-pay | Admitting: Cardiovascular Disease

## 2018-03-06 DIAGNOSIS — H6121 Impacted cerumen, right ear: Secondary | ICD-10-CM | POA: Diagnosis not present

## 2018-03-07 ENCOUNTER — Telehealth: Payer: Self-pay | Admitting: Nurse Practitioner

## 2018-03-07 NOTE — Telephone Encounter (Signed)
Pt calling regarding rf for lidocaine. He states that [facility] has faxed over request for PA 2 weeks ago. He wants to know the status of it.

## 2018-03-11 ENCOUNTER — Telehealth: Payer: Self-pay

## 2018-03-11 NOTE — Telephone Encounter (Signed)
Insurance will not pay for Lidocaine patch.  Per Nevin Bloodgood, have patient try Salonpas Lidocaine 4 % as directed.  Patient verbalized understanding.

## 2018-03-11 NOTE — Telephone Encounter (Signed)
I have contacted the pharmacy twice regarding correct fax number.  I have not received PA. I contacted patient today to make him aware and asked him to contact  contact pharmacy.

## 2018-03-17 ENCOUNTER — Other Ambulatory Visit: Payer: Self-pay | Admitting: *Deleted

## 2018-03-17 MED ORDER — WARFARIN SODIUM 5 MG PO TABS: ORAL_TABLET | ORAL | 1 refills | Status: DC

## 2018-03-25 ENCOUNTER — Encounter: Payer: Self-pay | Admitting: Family Medicine

## 2018-04-02 ENCOUNTER — Encounter: Payer: Self-pay | Admitting: Family Medicine

## 2018-04-02 ENCOUNTER — Ambulatory Visit (INDEPENDENT_AMBULATORY_CARE_PROVIDER_SITE_OTHER): Payer: Medicare Other | Admitting: Family Medicine

## 2018-04-02 VITALS — BP 124/62 | HR 58 | Temp 97.7°F | Wt 194.8 lb

## 2018-04-02 DIAGNOSIS — Z87442 Personal history of urinary calculi: Secondary | ICD-10-CM

## 2018-04-02 DIAGNOSIS — R1032 Left lower quadrant pain: Secondary | ICD-10-CM | POA: Diagnosis not present

## 2018-04-02 LAB — CBC WITH DIFFERENTIAL/PLATELET
BASOS ABS: 0 10*3/uL (ref 0.0–0.1)
Basophils Relative: 0.3 % (ref 0.0–3.0)
Eosinophils Absolute: 0.1 10*3/uL (ref 0.0–0.7)
Eosinophils Relative: 1.1 % (ref 0.0–5.0)
HEMATOCRIT: 49.4 % (ref 39.0–52.0)
Hemoglobin: 16.4 g/dL (ref 13.0–17.0)
LYMPHS PCT: 16.7 % (ref 12.0–46.0)
Lymphs Abs: 1.2 10*3/uL (ref 0.7–4.0)
MCHC: 33.2 g/dL (ref 30.0–36.0)
MCV: 87.5 fl (ref 78.0–100.0)
MONOS PCT: 8.8 % (ref 3.0–12.0)
Monocytes Absolute: 0.6 10*3/uL (ref 0.1–1.0)
NEUTROS ABS: 5.1 10*3/uL (ref 1.4–7.7)
Neutrophils Relative %: 73.1 % (ref 43.0–77.0)
PLATELETS: 182 10*3/uL (ref 150.0–400.0)
RBC: 5.64 Mil/uL (ref 4.22–5.81)
RDW: 14.3 % (ref 11.5–15.5)
WBC: 7 10*3/uL (ref 4.0–10.5)

## 2018-04-02 NOTE — Progress Notes (Signed)
Subjective:     Patient ID: Francis Campbell, male   DOB: 05/07/1946, 72 y.o.   MRN: [id number]  HPI Follow-up mild abdominal pain left lower quadrant. Refer to previous note. He states this is really not that painful but more of a nuisance about 1-2 out of 10. Left lower quadrant location. Duration about 4 months. Went to urology and had CT scan in April which showed some stones in the kidney but no ureteral stones. No hydronephrosis. He had colonoscopy back in 2013 with diverticulosis but no diverticulitis. Denies recent fever. No chills. No appetite or weight changes.  We referred to GI and they felt this maybe more musculoskeletal. Patient has been using 4% over-the-counter lidocaine patch with some improvement. Pain is not reproducible. No clear exacerbating factors. Patient had normal CBC back in December  Past Medical History:  Diagnosis Date  . Allergy   . Aortic regurgitation   . Arthritis   . Asthma    only as child  . Atrial flutter ([place])    a. Dx 09/2012.  . Crohn disease ([place])    Remotely - in the 1990s  . Esophageal reflux    Remotely  . History of kidney stones   . Hypercholesteremia   . Hypertension   . Paroxysmal atrial fibrillation ([facility])    Takes PRN flecainide  . 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  [name] TIA (transient ischemic attack)    ~2005 - had visual symptoms, was told by [name] this may have been a TIA but it's unclear  . Valvular heart disease    Mild AI, ASclerosis without AS by echo 10/2011   Past Surgical History:  Procedure Laterality Date  . CARDIOVERSION N/A 05/08/2013   Procedure: CARDIOVERSION;  Surgeon: Darlin Coco, MD;  Location: [facility] ENDOSCOPY;  Service: Cardiovascular;  Laterality: N/A;  . CYSTOSCOPY/RETROGRADE/URETEROSCOPY/STONE EXTRACTION WITH BASKET Right 02/12/2013   Procedure: CYSTOSCOPY/RETROGRADE/URETEROSCOPY/STONE EXTRACTION WITH BASKET, WITH  STENT PLACEMENT RIGHT URETERAL DILATION;  Surgeon: [name], MD;  Location: [facility] ORS;  Service: Urology;  Laterality: Right;  . EYE SURGERY Bilateral 1950, 1960, 1964   left, right, left  . HERNIA REPAIR  2005  . KNEE SURGERY Bilateral 1990, 2000  . LITHOTRIPSY    . ROTATOR CUFF REPAIR Left 2000  . TONSILLECTOMY      reports that he has never smoked. He has never used smokeless tobacco. He reports that he does not drink alcohol or use drugs. family history includes Appendicitis in his son; Arthritis in his paternal grandmother; Heart disease in his father and paternal grandmother; Hypertension in his mother; Kidney cancer in his son; Kidney disease in his mother; Leukemia in his father; Nephrolithiasis in his brother and father; Stroke in his mother. Allergies  Allergen Reactions  . Chocolate Palpitations  . Dextromethorphan Palpitations  . Flomax [Tamsulosin Hcl] Other (See Comments)    ? Causes A Fib  . Floxin [Ofloxacin] Other (See Comments)    hallucinations  . Lisinopril Cough  . Tavist Allergy [Clemastine Fumarate] Other (See Comments)    A fib worse  . Amiodarone Other (See Comments)    Abn  thyroid  . Robitussin Cf [Pseudoephedrine-Dm-Gg] Palpitations     Review of Systems  Constitutional: Negative for appetite change, chills, fever and unexpected weight change.  Gastrointestinal: Positive for abdominal pain. Negative for constipation, diarrhea, nausea and vomiting.  Genitourinary: Negative for dysuria and flank pain.  Musculoskeletal: Negative for back pain.  Skin:  Negative for rash.       Objective:   Physical Exam  Constitutional: He appears well-developed and well-nourished.  Cardiovascular: Normal rate and regular rhythm.  Pulmonary/Chest: Effort normal and breath sounds normal.  Abdominal: Soft. Bowel sounds are normal.  Minimally tender left lower quadrant deep palpation. No masses. No guarding or rebound. No skin rashes. No hernia       Assessment:     Very mild pain left lower-quadrant. No red flags such  as appetite or weight changes, fever, chills, night sweats, stool changes, etc. He had colonoscopy 6 years ago and recent CT scan as above unremarkable. Suspect musculoskeletal    Plan:     -Check CBC with differential to assess stability of hemoglobin. -Continue lidocaine patch. -We explained sometimes somewhat infrequently this could be due to nerve impingement. At this point his pain is relatively mild and we have decided not to get any MRI scans.  He agrees with this plan.  [name] MD [facility]

## 2018-04-02 NOTE — Patient Instructions (Signed)

## 2018-04-06 ENCOUNTER — Other Ambulatory Visit: Payer: Self-pay | Admitting: Family Medicine

## 2018-04-07 ENCOUNTER — Ambulatory Visit: Payer: Medicare Other | Admitting: Family Medicine

## 2018-04-07 NOTE — Telephone Encounter (Signed)
Refill OK

## 2018-04-07 NOTE — Telephone Encounter (Signed)
Any refills ?

## 2018-04-07 NOTE — Telephone Encounter (Signed)
Last refill 10/07/17 and last office visit 04/02/18.  Okay to fill?

## 2018-04-08 NOTE — Telephone Encounter (Signed)
3 refills OK.

## 2018-04-14 ENCOUNTER — Ambulatory Visit (INDEPENDENT_AMBULATORY_CARE_PROVIDER_SITE_OTHER): Payer: Medicare Other | Admitting: Pharmacist Clinician (PhC)/ Clinical Pharmacy Specialist

## 2018-04-14 DIAGNOSIS — I480 Paroxysmal atrial fibrillation: Secondary | ICD-10-CM | POA: Diagnosis not present

## 2018-04-14 DIAGNOSIS — Z7901 Long term (current) use of anticoagulants: Secondary | ICD-10-CM

## 2018-04-14 DIAGNOSIS — Z5181 Encounter for therapeutic drug level monitoring: Secondary | ICD-10-CM

## 2018-04-14 LAB — POCT INR: INR: 2.8 (ref 2.0–3.0)

## 2018-05-07 DIAGNOSIS — N50811 Right testicular pain: Secondary | ICD-10-CM | POA: Diagnosis not present

## 2018-05-07 DIAGNOSIS — N50812 Left testicular pain: Secondary | ICD-10-CM | POA: Diagnosis not present

## 2018-05-07 DIAGNOSIS — N5201 Erectile dysfunction due to arterial insufficiency: Secondary | ICD-10-CM | POA: Diagnosis not present

## 2018-05-07 DIAGNOSIS — N200 Calculus of kidney: Secondary | ICD-10-CM | POA: Diagnosis not present

## 2018-05-15 DIAGNOSIS — G5622 Lesion of ulnar nerve, left upper limb: Secondary | ICD-10-CM | POA: Diagnosis not present

## 2018-05-26 ENCOUNTER — Ambulatory Visit (INDEPENDENT_AMBULATORY_CARE_PROVIDER_SITE_OTHER): Payer: Medicare Other | Admitting: Pharmacist Clinician (PhC)/ Clinical Pharmacy Specialist

## 2018-05-26 DIAGNOSIS — Z5181 Encounter for therapeutic drug level monitoring: Secondary | ICD-10-CM | POA: Diagnosis not present

## 2018-05-26 DIAGNOSIS — I480 Paroxysmal atrial fibrillation: Secondary | ICD-10-CM | POA: Diagnosis not present

## 2018-05-26 LAB — POCT INR: INR: 3 (ref 2.0–3.0)

## 2018-05-26 NOTE — Patient Instructions (Signed)
Description   Continue taking 1 tablet daily except 2 tablet on Sundays and Thursdays. Recheck in 6 weeks.

## 2018-05-29 DIAGNOSIS — H25813 Combined forms of age-related cataract, bilateral: Secondary | ICD-10-CM | POA: Diagnosis not present

## 2018-05-29 DIAGNOSIS — H5213 Myopia, bilateral: Secondary | ICD-10-CM | POA: Diagnosis not present

## 2018-05-29 DIAGNOSIS — H52223 Regular astigmatism, bilateral: Secondary | ICD-10-CM | POA: Diagnosis not present

## 2018-05-29 DIAGNOSIS — H43393 Other vitreous opacities, bilateral: Secondary | ICD-10-CM | POA: Diagnosis not present

## 2018-05-29 DIAGNOSIS — H524 Presbyopia: Secondary | ICD-10-CM | POA: Diagnosis not present

## 2018-05-29 DIAGNOSIS — H43813 Vitreous degeneration, bilateral: Secondary | ICD-10-CM | POA: Diagnosis not present

## 2018-06-30 ENCOUNTER — Encounter: Payer: Self-pay | Admitting: Family Medicine

## 2018-07-07 ENCOUNTER — Ambulatory Visit (INDEPENDENT_AMBULATORY_CARE_PROVIDER_SITE_OTHER): Payer: Medicare Other | Admitting: Pharmacist Clinician (PhC)/ Clinical Pharmacy Specialist

## 2018-07-07 ENCOUNTER — Ambulatory Visit (INDEPENDENT_AMBULATORY_CARE_PROVIDER_SITE_OTHER): Payer: Medicare Other | Admitting: *Deleted

## 2018-07-07 DIAGNOSIS — Z7901 Long term (current) use of anticoagulants: Secondary | ICD-10-CM

## 2018-07-07 DIAGNOSIS — Z5181 Encounter for therapeutic drug level monitoring: Secondary | ICD-10-CM

## 2018-07-07 DIAGNOSIS — Z23 Encounter for immunization: Secondary | ICD-10-CM | POA: Diagnosis not present

## 2018-07-07 DIAGNOSIS — I480 Paroxysmal atrial fibrillation: Secondary | ICD-10-CM

## 2018-07-07 LAB — POCT INR: INR: 3.4 — AB (ref 2.0–3.0)

## 2018-07-07 NOTE — Patient Instructions (Signed)
Description   Decrease dose to 1 tablet daily except 2 tablet on Sundays. Recheck in 3 weeks.

## 2018-07-21 DIAGNOSIS — M1711 Unilateral primary osteoarthritis, right knee: Secondary | ICD-10-CM | POA: Diagnosis not present

## 2018-07-28 ENCOUNTER — Ambulatory Visit (INDEPENDENT_AMBULATORY_CARE_PROVIDER_SITE_OTHER): Payer: Medicare Other | Admitting: Pharmacist Clinician (PhC)/ Clinical Pharmacy Specialist

## 2018-07-28 DIAGNOSIS — Z7901 Long term (current) use of anticoagulants: Secondary | ICD-10-CM

## 2018-07-28 DIAGNOSIS — I480 Paroxysmal atrial fibrillation: Secondary | ICD-10-CM | POA: Diagnosis not present

## 2018-07-28 DIAGNOSIS — Z5181 Encounter for therapeutic drug level monitoring: Secondary | ICD-10-CM

## 2018-07-28 LAB — POCT INR: INR: 2.6 (ref 2.0–3.0)

## 2018-08-01 ENCOUNTER — Other Ambulatory Visit: Payer: Self-pay

## 2018-08-01 ENCOUNTER — Ambulatory Visit (INDEPENDENT_AMBULATORY_CARE_PROVIDER_SITE_OTHER): Payer: Medicare Other | Admitting: Family Medicine

## 2018-08-01 ENCOUNTER — Encounter: Payer: Self-pay | Admitting: Family Medicine

## 2018-08-01 VITALS — BP 120/70 | Temp 97.9°F | Wt 190.0 lb

## 2018-08-01 DIAGNOSIS — R1013 Epigastric pain: Secondary | ICD-10-CM | POA: Diagnosis not present

## 2018-08-01 DIAGNOSIS — K219 Gastro-esophageal reflux disease without esophagitis: Secondary | ICD-10-CM

## 2018-08-01 NOTE — Patient Instructions (Signed)
Start Pepcid 20 mg twice daily.   Let me know by next week if not improved.   Food Choices for Gastroesophageal Reflux Disease, Adult When you have gastroesophageal reflux disease (GERD), the foods you eat and your eating habits are very important. Choosing the right foods can help ease the discomfort of GERD. Consider working with a diet and nutrition specialist (dietitian) to help you make healthy food choices. What general guidelines should I follow? Eating plan  Choose healthy foods low in fat, such as fruits, vegetables, whole grains, low-fat dairy products, and lean meat, fish, and poultry.  Eat frequent, small meals instead of three large meals each day. Eat your meals slowly, in a relaxed setting. Avoid bending over or lying down until 2-3 hours after eating.  Limit high-fat foods such as fatty meats or fried foods.  Limit your intake of oils, butter, and shortening to less than 8 teaspoons each day.  Avoid the following: ? Foods that cause symptoms. These may be different for different people. Keep a food diary to keep track of foods that cause symptoms. ? Alcohol. ? Drinking large amounts of liquid with meals. ? Eating meals during the 2-3 hours before bed.  Cook foods using methods other than frying. This may include baking, grilling, or broiling. Lifestyle   Maintain a healthy weight. Ask your health care provider what weight is healthy for you. If you need to lose weight, work with your health care provider to do so safely.  Exercise for at least 30 minutes on 5 or more days each week, or as told by your health care provider.  Avoid wearing clothes that fit tightly around your waist and chest.  Do not use any products that contain nicotine or tobacco, such as cigarettes and e-cigarettes. If you need help quitting, ask your health care provider.  Sleep with the head of your bed raised. Use a wedge under the mattress or blocks under the bed frame to raise the head of  the bed. What foods are not recommended? The items listed may not be a complete list. Talk with your dietitian about what dietary choices are best for you. Grains Pastries or quick breads with added fat. [place] toast. Vegetables Deep fried vegetables. [place] fries. Any vegetables prepared with added fat. Any vegetables that cause symptoms. For some people this may include tomatoes and tomato products, chili peppers, onions and garlic, and horseradish. Fruits Any fruits prepared with added fat. Any fruits that cause symptoms. For some people this may include citrus fruits, such as oranges, grapefruit, pineapple, and lemons. Meats and other protein foods High-fat meats, such as fatty beef or pork, hot dogs, ribs, ham, sausage, salami and bacon. Fried meat or protein, including fried fish and fried chicken. Nuts and nut butters. Dairy Whole milk and chocolate milk. Sour cream. Cream. Ice cream. Cream cheese. Milk shakes. Beverages Coffee and tea, with or without caffeine. Carbonated beverages. Sodas. Energy drinks. Fruit juice made with acidic fruits (such as orange or grapefruit). Tomato juice. Alcoholic drinks. Fats and oils Butter. Margarine. Shortening. Ghee. Sweets and desserts Chocolate and cocoa. Donuts. Seasoning and other foods Pepper. Peppermint and spearmint. Any condiments, herbs, or seasonings that cause symptoms. For some people, this may include curry, hot sauce, or vinegar-based salad dressings. Summary  When you have gastroesophageal reflux disease (GERD), food and lifestyle choices are very important to help ease the discomfort of GERD.  Eat frequent, small meals instead of three large meals each day. Eat your meals  slowly, in a relaxed setting. Avoid bending over or lying down until 2-3 hours after eating.  Limit high-fat foods such as fatty meat or fried foods. This information is not intended to replace advice given to you by your health care provider. Make sure you  discuss any questions you have with your health care provider. Document Released: 09/03/2005 Document Revised: 09/04/2016 Document Reviewed: 09/04/2016 Elsevier Interactive Patient Education  [employer].

## 2018-08-01 NOTE — Progress Notes (Signed)
Subjective:     Patient ID: Francis Campbell, male   DOB: 15-May-1946, 72 y.o.   MRN: [id number]  HPI Is seen with abdominal pain epigastric area over the past couple weeks.  He is noted with things like nuts seem to aggravate.  He does have past history of reflux.  He quit taking Protonix months ago but had some breakthrough symptoms recently.  Denies any nausea or vomiting.  He has had a few pounds weight loss since last visit but good appetite.  Took Zantac a couple times which seemed to help.  No melena.  Patient had EGD 2013 which showed some gastritis and duodenitis.  No recent nonsteroidal use.  No regular alcohol use.  No dysphagia.  He has some alternating constipation and diarrhea which he has had somewhat in the past.  No bloody stools.  Past Medical History:  Diagnosis Date  . Allergy   . Aortic regurgitation   . Arthritis   . Asthma    only as child  . Atrial flutter ([place])    a. Dx 09/2012.  . Crohn disease ([place])    Remotely - in the 1990s  . Esophageal reflux    Remotely  . History of kidney stones   . Hypercholesteremia   . Hypertension   . Paroxysmal atrial fibrillation (H[facility])    Takes PRN flecainide  . 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  [name] TIA (transient ischemic attack)    ~2005 - had visual symptoms, was told by [name] this may have been a TIA but it's unclear  . Valvular heart disease    Mild AI, ASclerosis without AS by echo 10/2011   Past Surgical History:  Procedure Laterality Date  . CARDIOVERSION N/A 05/08/2013   Procedure: CARDIOVERSION;  Surgeon: Darlin Coco, MD;  Location: [facility] ENDOSCOPY;  Service: Cardiovascular;  Laterality: N/A;  . CYSTOSCOPY/RETROGRADE/URETEROSCOPY/STONE EXTRACTION WITH BASKET Right 02/12/2013   Procedure: CYSTOSCOPY/RETROGRADE/URETEROSCOPY/STONE EXTRACTION WITH BASKET, WITH  STENT PLACEMENT RIGHT URETERAL DILATION;  Surgeon: [name], MD;  Location: [facility] ORS;  Service: Urology;  Laterality:  Right;  . EYE SURGERY Bilateral 1950, 1960, 1964   left, right, left  . HERNIA REPAIR  2005  . KNEE SURGERY Bilateral 1990, 2000  . LITHOTRIPSY    . ROTATOR CUFF REPAIR Left 2000  . TONSILLECTOMY      reports that he has never smoked. He has never used smokeless tobacco. He reports that he does not drink alcohol or use drugs. family history includes Appendicitis in his son; Arthritis in his paternal grandmother; Heart disease in his father and paternal grandmother; Hypertension in his mother; Kidney cancer in his son; Kidney disease in his mother; Leukemia in his father; Nephrolithiasis in his brother and father; Stroke in his mother. Allergies  Allergen Reactions  . Chocolate Palpitations  . Dextromethorphan Palpitations  . Flomax [Tamsulosin Hcl] Other (See Comments)    ? Causes A Fib  . Floxin [Ofloxacin] Other (See Comments)    hallucinations  . Lisinopril Cough  . Tavist Allergy [Clemastine Fumarate] Other (See Comments)    A fib worse  . Tamsulosin Other (See Comments)  . Amiodarone Other (See Comments)    Abn  thyroid  . Robitussin Cf [Pseudoephedrine-Dm-Gg] Palpitations     Review of Systems  Constitutional: Negative for appetite change, chills and fever.  Respiratory: Negative for shortness of breath.   Cardiovascular: Negative for chest pain.  Gastrointestinal: Positive for abdominal pain, constipation and  diarrhea. Negative for blood in stool, nausea, rectal pain and vomiting.       Objective:   Physical Exam  Constitutional: He appears well-developed and well-nourished.  Pulmonary/Chest: Effort normal and breath sounds normal. He has no wheezes. He has no rales.  Abdominal: Normal appearance and bowel sounds are normal.  No reproducible tenderness.  No mass.  No guarding.  Neurological: He is alert.       Assessment:     Epigastric abdominal pain of a couple weeks duration.  Suspect GERD related.  No clear risk factors for gastritis    Plan:      -Discussed dietary factors as related to GERD -Avoid eating within 2 to 3 hours at bedtime -Try over-the-counter Pepcid 20 mg twice daily -Touch base by next week if symptoms not improved -Follow-up immediately for any melena, hematemesis, or any progressive abdominal pain  [name] MD [facility]

## 2018-08-04 ENCOUNTER — Encounter: Payer: Self-pay | Admitting: Family Medicine

## 2018-08-10 ENCOUNTER — Other Ambulatory Visit: Payer: Self-pay | Admitting: Family Medicine

## 2018-08-11 NOTE — Telephone Encounter (Signed)
Last refill 04/08/18 and last office visit 08/01/18 Okay to fill?

## 2018-08-21 ENCOUNTER — Encounter: Payer: Self-pay | Admitting: Family Medicine

## 2018-08-27 ENCOUNTER — Encounter: Payer: Self-pay | Admitting: Cardiovascular Disease

## 2018-08-27 ENCOUNTER — Ambulatory Visit (INDEPENDENT_AMBULATORY_CARE_PROVIDER_SITE_OTHER): Payer: Medicare Other | Admitting: Cardiovascular Disease

## 2018-08-27 VITALS — BP 128/72 | HR 62 | Ht 73.0 in | Wt 190.2 lb

## 2018-08-27 DIAGNOSIS — I10 Essential (primary) hypertension: Secondary | ICD-10-CM

## 2018-08-27 DIAGNOSIS — E785 Hyperlipidemia, unspecified: Secondary | ICD-10-CM

## 2018-08-27 DIAGNOSIS — Z5181 Encounter for therapeutic drug level monitoring: Secondary | ICD-10-CM

## 2018-08-27 DIAGNOSIS — I480 Paroxysmal atrial fibrillation: Secondary | ICD-10-CM

## 2018-08-27 NOTE — Patient Instructions (Signed)
Medication Instructions:  Your physician recommends that you continue on your current medications as directed. Please refer to the Current Medication list given to you today.  If you need a refill on your cardiac medications before your next appointment, please call your pharmacy.   Lab work: LP/CMET/MAGNESIUM TODAY  If you have labs (blood work) drawn today and your tests are completely normal, you will receive your results only by: [name] MyChart Message (if you have MyChart) OR . A paper copy in the mail If you have any lab test that is abnormal or we need to change your treatment, we will call you to review the results.  Testing/Procedures: NONE  Follow-Up: At [facility], you and your health needs are our priority.  As part of our continuing mission to provide you with exceptional heart care, we have created designated Provider Care Teams.  These Care Teams include your primary Cardiologist (physician) and Advanced Practice Providers (APPs -  Physician Assistants and Nurse Practitioners) who all work together to provide you with the care you need, when you need it. You will need a follow up appointment in 5 months.  Please call our office 2 months in advance to schedule this appointment.  You may see DR [facility]  or one of the following Advanced Practice Providers on your designated Care Team:   Kerin Ransom, PA-C Roby Lofts, [place] . Sande Rives, PA-C

## 2018-08-27 NOTE — Progress Notes (Signed)
Cardiology Office Note   Date:  08/27/2018   ID:  ANIS, DEGIDIO 05/29/1946, MRN [id number]  PCP:  [name], MD  Cardiologist:   Skeet Latch, MD   No chief complaint on file.    History of Present Illness: Francis Campbell is a 72 y.o. male with paroxysmal atrial fibrillation on coumadin, hypertension, hyperlipidemia, and Crohn's disease who presents for follow up.  Francis Campbell was previously a patient of [name].   Francis Campbell was evaluated by [name] for catheter ablation in 2014 and elected to use flecainide as a "pill in the pocket."  He underwent DCCV 04/2013.  He had a negative Myoview 09/2011 and an echo 10/2011 that revealed LVEF 60% with mild AR.  He noted increasing episodes of atrial fibrillation and started on daily flecainide 04/2016.  He developed bradycardia, so metoprolol was reduced.     Francis Campbell was seen in the ED 08/2017 with intermittent chest tightness.  His symptoms were felt to be atypical and EKG was unremarkable.   Troponin and chest x-ray are within normal limits.  He followed up in clinic and was referred for an exercise Myoview 09/2017 that was negative for ischemia.  He saw Francis Campbell, [place], on 12/2017 after a one hour episode of atrial fibrillation.  He was instructed to take an extra dose of flecainide as needed.  He hasn't had any recurrent atrial fibrillation since that time.  At his last appointment flecainide was increased to twice daily.  Since then he has had one episode of atrial fibrillation that lasted for approximately 10 hours.  He felt poorly while in it.  He took 2 tablets of flecainide that night into the following morning and he was back in sinus rhythm a couple hours later.  He has not been getting much exercise.  However he is trying to limit snacks and having smaller portions.  He gets knee injections on Monday and hopes to start exercising more after that.  He denies lower extremity edema, orthopnea, PND, chest pain, or  shortness of breath.   Past Medical History:  Diagnosis Date  . Allergy   . Aortic regurgitation   . Arthritis   . Asthma    only as child  . Atrial flutter ([place])    a. Dx 09/2012.  . Crohn disease ([place])    Remotely - in the 1990s  . Esophageal reflux    Remotely  . History of kidney stones   . Hypercholesteremia   . Hypertension   . Paroxysmal atrial fibrillation (HCC)    Takes PRN flecainide  . 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  [name] TIA (transient ischemic attack)    ~2005 - had visual symptoms, was told by [name] this may have been a TIA but it's unclear  . Valvular heart disease    Mild AI, ASclerosis without AS by echo 10/2011    Past Surgical History:  Procedure Laterality Date  . CARDIOVERSION N/A 05/08/2013   Procedure: CARDIOVERSION;  Surgeon: Darlin Coco, MD;  Location: [facility] ENDOSCOPY;  Service: Cardiovascular;  Laterality: N/A;  . CYSTOSCOPY/RETROGRADE/URETEROSCOPY/STONE EXTRACTION WITH BASKET Right 02/12/2013   Procedure: CYSTOSCOPY/RETROGRADE/URETEROSCOPY/STONE EXTRACTION WITH BASKET, WITH  STENT PLACEMENT RIGHT URETERAL DILATION;  Surgeon: [name], MD;  Location: [facility] ORS;  Service: Urology;  Laterality: Right;  . EYE SURGERY Bilateral 1950, 1960, 1964   left, right, left  . HERNIA REPAIR  2005  .  KNEE SURGERY Bilateral 1990, 2000  . LITHOTRIPSY    . ROTATOR CUFF REPAIR Left 2000  . TONSILLECTOMY       Current Outpatient Medications  Medication Sig Dispense Refill  . ALPRAZolam (XANAX) 0.25 MG tablet TAKE 1 TABLET BY MOUTH AT BEDTIME AS NEEDED FOR ANXIETY 30 tablet 3  . Cholecalciferol (VITAMIN D3) 1000 UNITS CAPS Take 2 capsules by mouth every morning.     . diltiazem (CARTIA XT) 240 MG 24 hr capsule Take 240 mg by mouth in the morning 90 capsule 3  . flecainide (TAMBOCOR) 100 MG tablet Take 1 tablet (100 mg total) by mouth 2 (two) times daily. 180 tablet 3  . metoprolol succinate (TOPROL-XL) 25 MG 24 hr tablet  Take 1 tablet (25 mg total) by mouth daily. Take with or immediately following a meal. 90 tablet 3  . Multiple Vitamin (MULTIVITAMIN) tablet Take 1 tablet by mouth daily.    . potassium citrate (UROCIT-K) 10 MEQ (1080 MG) SR tablet Take 10 mEq by mouth 3 (three) times daily.  11  . rosuvastatin (CRESTOR) 10 MG tablet TAKE 1 TABLET EVERY DAY 90 tablet 3  . Turmeric 500 MG CAPS Take 500 mg by mouth daily.    [name] warfarin (COUMADIN) 5 MG tablet TAKE 1 TO 2 TABLETS  DAILY AS DIRECTED BY COUMADIN CLINIC 120 tablet 1   No current facility-administered medications for this visit.     Allergies:   Chocolate; Dextromethorphan; Flomax [tamsulosin hcl]; Floxin [ofloxacin]; Lisinopril; Tavist allergy [clemastine fumarate]; Tamsulosin; Amiodarone; and Robitussin cf [pseudoephedrine-dm-gg]    Social History:  The patient  reports that he has never smoked. He has never used smokeless tobacco. He reports that he does not drink alcohol or use drugs.   Family History:  The patient's family history includes Appendicitis in his son; Arthritis in his paternal grandmother; Heart disease in his father and paternal grandmother; Hypertension in his mother; Kidney cancer in his son; Kidney disease in his mother; Leukemia in his father; Nephrolithiasis in his brother and father; Stroke in his mother.    ROS:  Please see the history of present illness.   Otherwise, review of systems are positive for none.   All other systems are reviewed and negative.    PHYSICAL EXAM: VS:  BP 128/72   Pulse 62   Ht 6' 1"  (1.854 m)   Wt 190 lb 3.2 oz (86.3 kg)   BMI 25.09 kg/m  , BMI Body mass index is 25.09 kg/m. GENERAL:  Well appearing HEENT: Pupils equal round and reactive, fundi not visualized, oral mucosa unremarkable NECK:  No jugular venous distention, waveform within normal limits, carotid upstroke brisk and symmetric, no bruits, no thyromegaly LYMPHATICS:  No cervical adenopathy LUNGS:  Clear to auscultation  bilaterally HEART:  RRR.  PMI not displaced or sustained,S1 and S2 within normal limits, no S3, no S4, no clicks, no rubs, II/VI systolic murmur at the LUSB. ABD:  Flat, positive bowel sounds normal in frequency in pitch, no bruits, no rebound, no guarding, no midline pulsatile mass, no hepatomegaly, no splenomegaly EXT:  2 plus pulses throughout, no edema, no cyanosis no clubbing SKIN:  No rashes no nodules NEURO:  Cranial nerves II through XII grossly intact, motor grossly intact throughout PSYCH:  Cognitively intact, oriented to person place and time   EKG:  EKG is ordered today. The ekg ordered 06/26/16 demonstrates sinus bradycardia rate 52.  06/04/17: Sinus bradycardia.  Rate 48 bpm. 08/27/18: Sinus rhythm.  Rate 62  bpm.    Echo 01/03/18: Study Conclusions  - Left ventricle: The cavity size was normal. Wall thickness was   increased in a pattern of mild LVH. Systolic function was normal.   The estimated ejection fraction was in the range of 60% to 65%.   Wall motion was normal; there were no regional wall motion   abnormalities. Left ventricular diastolic function parameters   were normal. - Aortic valve: Dilated aortic sinus. There was mild regurgitation. - Atrial septum: No defect or patent foramen ovale was identified. - Pulmonary arteries: PA peak pressure: 31 mm Hg (S).  Exercise Myoview 09/25/17:   The patient walked for a total of 10 minutes and 15 seconds under standard Bruce protocol treadmill test. The peak heart rate is 142 which is 96% predicted maximal heart rate.  There were no ST or T wave changes to suggest ischemia.  Nuclear stress EF: 61%. The left ventricular ejection fraction is normal (55-65%).  This is a low risk study. The study is normal.  Recent Labs: 09/12/2017: B Natriuretic Peptide 48.2; BUN 25; Creatinine, Ser 1.33; Potassium 4.7; Sodium 140 10/07/2017: ALT 23 04/02/2018: Hemoglobin 16.4; Platelets 182.0    Lipid Panel    Component Value  Date/Time   CHOL 157 10/07/2017 1510   TRIG 292.0 (H) 10/07/2017 1510   HDL 32.90 (L) 10/07/2017 1510   CHOLHDL 5 10/07/2017 1510   VLDL 58.4 (H) 10/07/2017 1510   LDLCALC 74 12/06/2016 0950   LDLDIRECT 96.0 10/07/2017 1510      Wt Readings from Last 3 Encounters:  08/27/18 190 lb 3.2 oz (86.3 kg)  08/01/18 190 lb (86.2 kg)  04/02/18 194 lb 12.8 oz (88.4 kg)     ASSESSMENT AND PLAN:  # Paroxysmal atrial fibrillation:  # Bradycardia: Mr. Salva had one episode of atrial fibrillation but is otherwise doing well.  Continue flecainide, metoprolol, and diltiazem.  Continue warfarin.  We will check a comprehensive metabolic panel and magnesium today.  This patients CHA2DS2-VASc Score and unadjusted Ischemic Stroke Rate (% per year) is equal to 2.2 % stroke rate/year from a score of 2  Above score calculated as 1 point each if present [CHF, HTN, DM, Vascular=MI/PAD/Aortic Plaque, Age if 65-74, or Male] Above score calculated as 2 points each if present [Age > 75, or Stroke/TIA/TE]  # Atypical chest pain: Resolved. Exercise Myoview was negative for ischemia.    # Hypertension: BP well-controlled. Continue diltiazem, losartan and metoprolol.  # Hyperlipidemia:  LDL 74 11/2016.  Continue rosuvastatin.  Check lipids and CMP today.  # Aortic valve sclerosis: Unchanged on echo 12/2017.    Current medicines are reviewed at length with the patient today.  The patient does not have concerns regarding medicines.  The following changes have been made:  none  Labs/ tests ordered today include:   Orders Placed This Encounter  Procedures  . Lipid panel  . Comprehensive metabolic panel  . Magnesium  . EKG 12-Lead     Disposition:   FU with Muriel Hannold C. Oval Linsey, MD, [facility] in 5 months      Signed, Grainne Knights C. Oval Linsey, MD, [facility]  08/27/2018 1:29 PM    [facility]

## 2018-08-28 LAB — COMPREHENSIVE METABOLIC PANEL
ALBUMIN: 4.6 g/dL (ref 3.5–4.8)
ALT: 22 IU/L (ref 0–44)
AST: 21 IU/L (ref 0–40)
Albumin/Globulin Ratio: 2.3 — ABNORMAL HIGH (ref 1.2–2.2)
Alkaline Phosphatase: 90 IU/L (ref 39–117)
BUN/Creatinine Ratio: 27 — ABNORMAL HIGH (ref 10–24)
BUN: 24 mg/dL (ref 8–27)
Bilirubin Total: 0.6 mg/dL (ref 0.0–1.2)
CHLORIDE: 103 mmol/L (ref 96–106)
CO2: 25 mmol/L (ref 20–29)
CREATININE: 0.9 mg/dL (ref 0.76–1.27)
Calcium: 9.7 mg/dL (ref 8.6–10.2)
GFR calc Af Amer: 99 mL/min/{1.73_m2} (ref >59)
GFR calc non Af Amer: 86 mL/min/{1.73_m2} (ref >59)
Globulin, Total: 2 g/dL (ref 1.5–4.5)
Glucose: 84 mg/dL (ref 65–99)
Potassium: 5 mmol/L (ref 3.5–5.2)
Sodium: 144 mmol/L (ref 134–144)
TOTAL PROTEIN: 6.6 g/dL (ref 6.0–8.5)

## 2018-08-28 LAB — LIPID PANEL
Chol/HDL Ratio: 4.4 ratio (ref 0.0–5.0)
Cholesterol, Total: 166 mg/dL (ref 100–199)
HDL: 38 mg/dL — ABNORMAL LOW (ref >39)
LDL Calculated: 95 mg/dL (ref 0–99)
TRIGLYCERIDES: 167 mg/dL — AB (ref 0–149)
VLDL CHOLESTEROL CAL: 33 mg/dL (ref 5–40)

## 2018-08-28 LAB — MAGNESIUM: Magnesium: 2.3 mg/dL (ref 1.6–2.3)

## 2018-08-29 ENCOUNTER — Other Ambulatory Visit: Payer: Self-pay

## 2018-08-29 ENCOUNTER — Ambulatory Visit (INDEPENDENT_AMBULATORY_CARE_PROVIDER_SITE_OTHER): Payer: Medicare Other | Admitting: Family Medicine

## 2018-08-29 ENCOUNTER — Encounter: Payer: Self-pay | Admitting: Family Medicine

## 2018-08-29 VITALS — BP 114/76 | HR 73 | Temp 98.0°F | Ht 73.0 in | Wt 192.3 lb

## 2018-08-29 DIAGNOSIS — R05 Cough: Secondary | ICD-10-CM

## 2018-08-29 DIAGNOSIS — R059 Cough, unspecified: Secondary | ICD-10-CM

## 2018-08-29 NOTE — Progress Notes (Signed)
Subjective:     Patient ID: Francis Campbell, male   DOB: 1945/10/09, 72 y.o.   MRN: [id number]  HPI Patient is a non-smoker who was seen with upper respiratory symptoms with onset about 12 days ago.  He initially had some nasal congestion and sore throat now mostly cough.  Still has some mild sore throat.  He thinks he had some low-grade fever last week but none since last Friday.  No dyspnea.  Cough is especially bothersome at night.  He is using home remedy with a very small amount of whiskey and that seems to be working.  He cannot take codeine or hydrocodone.  Past Medical History:  Diagnosis Date  . Allergy   . Aortic regurgitation   . Arthritis   . Asthma    only as child  . Atrial flutter ([place])    a. Dx 09/2012.  . Crohn disease ([place])    Remotely - in the 1990s  . Esophageal reflux    Remotely  . History of kidney stones   . Hypercholesteremia   . Hypertension   . Paroxysmal atrial fibrillation (HCC)    Takes PRN flecainide  . 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  [name] TIA (transient ischemic attack)    ~2005 - had visual symptoms, was told by [name] this may have been a TIA but it's unclear  . Valvular heart disease    Mild AI, ASclerosis without AS by echo 10/2011   Past Surgical History:  Procedure Laterality Date  . CARDIOVERSION N/A 05/08/2013   Procedure: CARDIOVERSION;  Surgeon: Darlin Coco, MD;  Location[facility] ENDOSCOPY;  Service: Cardiovascular;  Laterality: N/A;  . CYSTOSCOPY/RETROGRADE/URETEROSCOPY/STONE EXTRACTION WITH BASKET Right 02/12/2013   Procedure: CYSTOSCOPY/RETROGRADE/URETEROSCOPY/STONE EXTRACTION WITH BASKET, WITH  STENT PLACEMENT RIGHT URETERAL DILATION;  Surgeon: [name], MD;  Location: [facility] ORS;  Service: Urology;  Laterality: Right;  . EYE SURGERY Bilateral 1950, 1960, 1964   left, right, left  . HERNIA REPAIR  2005  . KNEE SURGERY Bilateral 1990, 2000  . LITHOTRIPSY    . ROTATOR CUFF REPAIR Left 2000  .  TONSILLECTOMY      reports that he has never smoked. He has never used smokeless tobacco. He reports that he does not drink alcohol or use drugs. family history includes Appendicitis in his son; Arthritis in his paternal grandmother; Heart disease in his father and paternal grandmother; Hypertension in his mother; Kidney cancer in his son; Kidney disease in his mother; Leukemia in his father; Nephrolithiasis in his brother and father; Stroke in his mother. Allergies  Allergen Reactions  . Chocolate Palpitations  . Chocolate Flavor Shortness Of Breath  . Dextroamphetamine Shortness Of Breath  . Dextromethorphan Palpitations  . Flomax [Tamsulosin Hcl] Other (See Comments)    ? Causes A Fib  . Floxin [Ofloxacin] Other (See Comments)    hallucinations  . Guaifenesin Shortness Of Breath  . Lisinopril Cough  . Tavist Allergy [Clemastine Fumarate] Other (See Comments)    A fib worse  . Tamsulosin Other (See Comments)  . Amiodarone Other (See Comments)    Abn  thyroid  . Robitussin Cf [Pseudoephedrine-Dm-Gg] Palpitations     Review of Systems  Constitutional: Negative for chills and fever.  HENT: Positive for congestion and sore throat.   Respiratory: Positive for cough. Negative for shortness of breath and wheezing.   Cardiovascular: Negative for chest pain.       Objective:   Physical Exam  Constitutional:      Appearance: Normal appearance.  HENT:     Right Ear: Tympanic membrane normal.     Left Ear: Tympanic membrane normal.  Neck:     Musculoskeletal: Neck supple.  Cardiovascular:     Rate and Rhythm: Normal rate and regular rhythm.  Pulmonary:     Effort: Pulmonary effort is normal. No respiratory distress.     Breath sounds: Normal breath sounds. No wheezing or rales.  Lymphadenopathy:     Cervical: No cervical adenopathy.  Neurological:     Mental Status: He is alert.        Assessment:     Patient seen with cough for the past couple weeks.  Nonfocal exam.  No  respiratory distress.  Suspect resolving viral bronchitis    Plan:     -Continue over-the-counter cough remedy which seems to be working fairly well -Stay well-hydrated -Follow-up immediately for any fever or worsening symptoms and also recommend touch base in 2 to 3 weeks if cough not resolving  [name] MD  Primary Care at [facility]

## 2018-08-29 NOTE — Patient Instructions (Signed)
Cough, Adult  Coughing is a reflex that clears your throat and your airways. Coughing helps to heal and protect your lungs. It is normal to cough occasionally, but a cough that happens with other symptoms or lasts a long time may be a sign of a condition that needs treatment. A cough may last only 2-3 weeks (acute), or it may last longer than 8 weeks (chronic).  What are the causes?  Coughing is commonly caused by:   Breathing in substances that irritate your lungs.   A viral or bacterial respiratory infection.   Allergies.   Asthma.   Postnasal drip.   Smoking.   Acid backing up from the stomach into the esophagus (gastroesophageal reflux).   Certain medicines.   Chronic lung problems, including COPD (or rarely, lung cancer).   Other medical conditions such as heart failure.    Follow these instructions at home:  Pay attention to any changes in your symptoms. Take these actions to help with your discomfort:   Take medicines only as told by your health care provider.  ? If you were prescribed an antibiotic medicine, take it as told by your health care provider. Do not stop taking the antibiotic even if you start to feel better.  ? Talk with your health care provider before you take a cough suppressant medicine.   Drink enough fluid to keep your urine clear or pale yellow.   If the air is dry, use a cold steam vaporizer or humidifier in your bedroom or your home to help loosen secretions.   Avoid anything that causes you to cough at work or at home.   If your cough is worse at night, try sleeping in a semi-upright position.   Avoid cigarette smoke. If you smoke, quit smoking. If you need help quitting, ask your health care provider.   Avoid caffeine.   Avoid alcohol.   Rest as needed.    Contact a health care provider if:   You have new symptoms.   You cough up pus.   Your cough does not get better after 2-3 weeks, or your cough gets worse.   You cannot control your cough with suppressant  medicines and you are losing sleep.   You develop pain that is getting worse or pain that is not controlled with pain medicines.   You have a fever.   You have unexplained weight loss.   You have night sweats.  Get help right away if:   You cough up blood.   You have difficulty breathing.   Your heartbeat is very fast.  This information is not intended to replace advice given to you by your health care provider. Make sure you discuss any questions you have with your health care provider.  Document Released: 03/02/2011 Document Revised: 02/09/2016 Document Reviewed: 11/10/2014  Elsevier Interactive Patient Education  2018 [employer] Inc[employer]

## 2018-09-01 DIAGNOSIS — M1711 Unilateral primary osteoarthritis, right knee: Secondary | ICD-10-CM | POA: Diagnosis not present

## 2018-09-04 DIAGNOSIS — J320 Chronic maxillary sinusitis: Secondary | ICD-10-CM | POA: Diagnosis not present

## 2018-09-04 DIAGNOSIS — J322 Chronic ethmoidal sinusitis: Secondary | ICD-10-CM | POA: Diagnosis not present

## 2018-09-04 DIAGNOSIS — R05 Cough: Secondary | ICD-10-CM | POA: Diagnosis not present

## 2018-09-04 DIAGNOSIS — J370 Chronic laryngitis: Secondary | ICD-10-CM | POA: Diagnosis not present

## 2018-09-08 ENCOUNTER — Ambulatory Visit (INDEPENDENT_AMBULATORY_CARE_PROVIDER_SITE_OTHER): Payer: Medicare Other | Admitting: Pharmacist

## 2018-09-08 DIAGNOSIS — M1711 Unilateral primary osteoarthritis, right knee: Secondary | ICD-10-CM | POA: Diagnosis not present

## 2018-09-08 DIAGNOSIS — Z5181 Encounter for therapeutic drug level monitoring: Secondary | ICD-10-CM

## 2018-09-08 DIAGNOSIS — I480 Paroxysmal atrial fibrillation: Secondary | ICD-10-CM

## 2018-09-08 LAB — POCT INR: INR: 2 (ref 2.0–3.0)

## 2018-09-14 ENCOUNTER — Other Ambulatory Visit: Payer: Self-pay | Admitting: Cardiovascular Disease

## 2018-09-15 DIAGNOSIS — M1711 Unilateral primary osteoarthritis, right knee: Secondary | ICD-10-CM | POA: Diagnosis not present

## 2018-10-13 MED ORDER — FLECAINIDE ACETATE 100 MG PO TABS: 100.0000 mg | ORAL_TABLET | Freq: Two times a day (BID) | ORAL | 0 refills | Status: DC

## 2018-10-20 ENCOUNTER — Ambulatory Visit (INDEPENDENT_AMBULATORY_CARE_PROVIDER_SITE_OTHER): Payer: Medicare Other | Admitting: *Deleted

## 2018-10-20 DIAGNOSIS — I480 Paroxysmal atrial fibrillation: Secondary | ICD-10-CM | POA: Diagnosis not present

## 2018-10-20 DIAGNOSIS — Z5181 Encounter for therapeutic drug level monitoring: Secondary | ICD-10-CM

## 2018-10-20 LAB — POCT INR: INR: 2 (ref 2.0–3.0)

## 2018-10-20 NOTE — Patient Instructions (Signed)
Description   Start taking 37m daily except 118mon Sundays and Thursdays. Recheck in 3 weeks.

## 2018-10-22 DIAGNOSIS — Z125 Encounter for screening for malignant neoplasm of prostate: Secondary | ICD-10-CM | POA: Diagnosis not present

## 2018-10-22 DIAGNOSIS — N200 Calculus of kidney: Secondary | ICD-10-CM | POA: Diagnosis not present

## 2018-10-29 DIAGNOSIS — N200 Calculus of kidney: Secondary | ICD-10-CM | POA: Diagnosis not present

## 2018-10-29 DIAGNOSIS — N5201 Erectile dysfunction due to arterial insufficiency: Secondary | ICD-10-CM | POA: Diagnosis not present

## 2018-10-29 DIAGNOSIS — R3121 Asymptomatic microscopic hematuria: Secondary | ICD-10-CM | POA: Diagnosis not present

## 2018-11-03 DIAGNOSIS — M542 Cervicalgia: Secondary | ICD-10-CM | POA: Diagnosis not present

## 2018-11-10 ENCOUNTER — Ambulatory Visit (INDEPENDENT_AMBULATORY_CARE_PROVIDER_SITE_OTHER): Payer: Medicare Other | Admitting: *Deleted

## 2018-11-10 DIAGNOSIS — Z5181 Encounter for therapeutic drug level monitoring: Secondary | ICD-10-CM | POA: Diagnosis not present

## 2018-11-10 DIAGNOSIS — I480 Paroxysmal atrial fibrillation: Secondary | ICD-10-CM

## 2018-11-10 LAB — POCT INR: INR: 2 (ref 2.0–3.0)

## 2018-11-10 NOTE — Patient Instructions (Signed)
Description   Change your dose to 40m daily except 173mon Sundays, Tuesdays and Thursdays. Recheck in 2 weeks.

## 2018-11-12 ENCOUNTER — Encounter: Payer: Self-pay | Admitting: Family Medicine

## 2018-11-12 DIAGNOSIS — M542 Cervicalgia: Secondary | ICD-10-CM | POA: Diagnosis not present

## 2018-11-17 DIAGNOSIS — M542 Cervicalgia: Secondary | ICD-10-CM | POA: Diagnosis not present

## 2018-11-20 DIAGNOSIS — M542 Cervicalgia: Secondary | ICD-10-CM | POA: Diagnosis not present

## 2018-11-24 ENCOUNTER — Ambulatory Visit (INDEPENDENT_AMBULATORY_CARE_PROVIDER_SITE_OTHER): Payer: Medicare Other | Admitting: *Deleted

## 2018-11-24 DIAGNOSIS — I480 Paroxysmal atrial fibrillation: Secondary | ICD-10-CM | POA: Diagnosis not present

## 2018-11-24 DIAGNOSIS — Z5181 Encounter for therapeutic drug level monitoring: Secondary | ICD-10-CM

## 2018-11-24 LAB — POCT INR: INR: 2.3 (ref 2.0–3.0)

## 2018-11-24 NOTE — Patient Instructions (Signed)
Description   Continue taking 33m daily except 152mon Sundays, Tuesdays and Thursdays. Recheck in 3 weeks.

## 2018-11-25 DIAGNOSIS — M542 Cervicalgia: Secondary | ICD-10-CM | POA: Diagnosis not present

## 2018-11-26 ENCOUNTER — Telehealth: Payer: Self-pay

## 2018-11-26 ENCOUNTER — Other Ambulatory Visit: Payer: Self-pay

## 2018-11-26 ENCOUNTER — Encounter: Payer: Self-pay | Admitting: Nurse Practitioner

## 2018-11-26 ENCOUNTER — Ambulatory Visit (INDEPENDENT_AMBULATORY_CARE_PROVIDER_SITE_OTHER): Payer: Medicare Other | Admitting: Nurse Practitioner

## 2018-11-26 VITALS — BP 140/64 | HR 64 | Temp 98.1°F | Ht 72.0 in | Wt 196.0 lb

## 2018-11-26 DIAGNOSIS — K8080 Other cholelithiasis without obstruction: Secondary | ICD-10-CM | POA: Diagnosis not present

## 2018-11-26 DIAGNOSIS — R1013 Epigastric pain: Secondary | ICD-10-CM | POA: Diagnosis not present

## 2018-11-26 DIAGNOSIS — I4891 Unspecified atrial fibrillation: Secondary | ICD-10-CM

## 2018-11-26 NOTE — Telephone Encounter (Signed)
Patient with diagnosis of afib on warfarin for anticoagulation.    Procedure: EGD Date of procedure: 12/08/2018  CHADS2-VASc score of  2 (CHF, HTN, AGE, DM2, stroke/tia x 2, CAD, AGE, male)  Per office protocol, patient can hold warfarin for 5 days prior to procedure.

## 2018-11-26 NOTE — Progress Notes (Signed)
Chief Complaint:    Abdominal pain.   IMPRESSION and PLAN:     1.  73 yo male with ~ 8 month history of non-radiating, burning epigastric discomfort improved but not resolved with BID H2 blocker and omission of daily carbonated beverages. Pain not really related to eating except when he eats on an empty stomach. CBC and liver tests normal in December. No weight loss. He has known cholelithiasis though symptoms don't seem biliary. Doesn't really sound pancreatic.  -Will start with an EGD for uninvestigated upper abdominal pain.    The risks and benefits of EGD were discussed and the patient agrees to proceed.  -consider abdominal imaging if EGD negative and pain persists  2. AFIB on coumadin.  -Hold Coumadinx for 5 days before procedure - will instruct when and how to resume after procedure. Patient understands that there is a low but real risk of cardiovascular event such as heart attack, stroke, or embolism /  thrombosis, or ischemia while off Coumadin. The patient consents to proceed. Will communicate by phone or EMR with patient's prescribing provider to confirm that holding Coumadin is reasonable in this case.    3. Cholelithiasis, asymptomatic ( I think) though will keep in mind if above workup unrevealing. [name]    HPI:     Patient is a 73 yo male with AFIB on warfarin known to Dr. Ardis Hughs. Heis here for evaluation of intermittent epigastric pain. Pain actually started several months ago. PCP started him on Zantac 150 mg BID which helped but didn't relive the pain. He was drinking 2-3 carbonated beverages a day. A few weeks ago he stopped those and there was further improvement in the epigastric pain. The pain is worse with eating but only if his stomach is empty when he eats. The pain is a burning pain. Feels like stomach is "raw". The pain is not positional. He hasn't had any associated nausea. No bowel changes and his weight is stable. No dark urine or yellowing of eyes. No NSAID  use. Pain present for ~ 8 months. Liver tests in Dec 2019 were normal.   Wife offers that patient has malodorous breath. Odrr doesn't smell like typical bad breath however and he gets regular dental checkups. .    Review of systems:     No chest pain, no SOB, no fevers, no urinary sx   Past Medical History:  Diagnosis Date  . Allergy   . Aortic regurgitation   . Arthritis   . Asthma    only as child  . Atrial flutter ([place])    a. Dx 09/2012.  . Crohn disease ([place])    Remotely - in the 1990s  . Esophageal reflux    Remotely  . History of kidney stones   . Hypercholesteremia   . Hypertension   . Paroxysmal atrial fibrillation (HCC)    Takes PRN flecainide  . 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  [name] TIA (transient ischemic attack)    ~2005 - had visual symptoms, was told by [name] this may have been a TIA but it's unclear  . Valvular heart disease    Mild AI, ASclerosis without AS by echo 10/2011    Patient's surgical history, family medical history, social history, medications and allergies were all reviewed in Epic   Creatinine clearance cannot be calculated (Patient's most recent lab result is older than the maximum 21 days allowed.)  Current Outpatient Medications  Medication Sig Dispense Refill  . ALPRAZolam (XANAX) 0.25 MG tablet TAKE 1 TABLET BY MOUTH AT BEDTIME AS NEEDED FOR ANXIETY 30 tablet 3  . Cholecalciferol (VITAMIN D3) 1000 UNITS CAPS Take 2 capsules by mouth every morning.     . diltiazem (CARTIA XT) 240 MG 24 hr capsule Take 240 mg by mouth in the morning 90 capsule 3  . flecainide (TAMBOCOR) 100 MG tablet Take 1 tablet (100 mg total) by mouth 2 (two) times daily. 180 tablet 0  . metoprolol succinate (TOPROL-XL) 25 MG 24 hr tablet Take 1 tablet (25 mg total) by mouth daily. Take with or immediately following a meal. 90 tablet 3  . Multiple Vitamin (MULTIVITAMIN) tablet Take 1 tablet by mouth daily.    . potassium citrate  (UROCIT-K) 10 MEQ (1080 MG) SR tablet Take 10 mEq by mouth 3 (three) times daily.  11  . rosuvastatin (CRESTOR) 10 MG tablet TAKE 1 TABLET EVERY DAY 90 tablet 3  . Turmeric 500 MG CAPS Take 500 mg by mouth daily.    [name] warfarin (JANTOVEN) 5 MG tablet TAKE 1-2 TABLETS DAILY AS  DIRECTED BY COUMADIN CLINIC 120 tablet 1   No current facility-administered medications for this visit.     Physical Exam:     BP 140/64 (BP Location: Left Arm, Patient Position: Sitting, Cuff Size: Normal)   Pulse 64   Temp 98.1 F (36.7 C)   Ht 6' (1.829 m)   Wt 196 lb (88.9 kg)   BMI 26.58 kg/m   GENERAL:  Pleasant male in NAD PSYCH: : Cooperative, normal affect EENT:  conjunctiva pink, mucous membranes moist, neck supple without masses CARDIAC:  RRR,  no peripheral edema PULM: Normal respiratory effort, lungs CTA bilaterally, no wheezing ABDOMEN:  Nondistended, soft, nontender. No obvious masses, no hepatomegaly,  normal bowel sounds SKIN:  turgor, no lesions seen Musculoskeletal:  Normal muscle tone, normal strength NEURO: Alert and oriented x 3, no focal neurologic deficits   Tye Savoy , NP 11/26/2018, 2:44 PM

## 2018-11-26 NOTE — Telephone Encounter (Signed)
[facility] Pre-operative Risk Assessment     Request for surgical clearance:     Endoscopy Procedure  What type of surgery is being performed?     EGD  When is this surgery scheduled?     12/08/18  What type of clearance is required ?   Pharmacy  Are there any medications that need to be held prior to surgery and how long? HOLD COUMADIN FIVE DAYS PRIOR  Practice name and name of physician performing surgery?      [facility]/Dr. Ardis Hughs  What is your office phone and fax number?      Phone- [phone]  [phone]  Anesthesia type (None, local, MAC, general) ?       MAC

## 2018-11-26 NOTE — Telephone Encounter (Signed)
Pharm please address

## 2018-11-26 NOTE — Patient Instructions (Signed)
If you are age 72 or older, your body mass index should be between 23-30. Your Body mass index is 26.58 kg/m. If this is out of the aforementioned range listed, please consider follow up with your Primary Care Provider.  If you are age 65 or younger, your body mass index should be between 19-25. Your Body mass index is 26.58 kg/m. If this is out of the aformentioned range listed, please consider follow up with your Primary Care Provider.   You have been scheduled for an endoscopy. Please follow written instructions given to you at your visit today. If you use inhalers (even only as needed), please bring them with you on the day of your procedure. Your physician has requested that you go to www.startemmi.com and enter the access code given to you at your visit today. This web site gives a general overview about your procedure. However, you should still follow specific instructions given to you by our office regarding your preparation for the procedure.  You will be contacted by our office prior to your procedure for directions on holding your Coumadin.  If you do not hear from our office 1 week prior to your scheduled procedure, please call [phone] to discuss.   Thank you for choosing me and [facility].   Tye Savoy, NP

## 2018-11-27 ENCOUNTER — Encounter: Payer: Self-pay | Admitting: Nurse Practitioner

## 2018-11-27 DIAGNOSIS — H5213 Myopia, bilateral: Secondary | ICD-10-CM | POA: Diagnosis not present

## 2018-11-27 DIAGNOSIS — H52223 Regular astigmatism, bilateral: Secondary | ICD-10-CM | POA: Diagnosis not present

## 2018-11-27 DIAGNOSIS — H25813 Combined forms of age-related cataract, bilateral: Secondary | ICD-10-CM | POA: Diagnosis not present

## 2018-11-27 DIAGNOSIS — M542 Cervicalgia: Secondary | ICD-10-CM | POA: Diagnosis not present

## 2018-11-27 MED ORDER — METOPROLOL SUCCINATE ER 25 MG PO TB24: 25.0000 mg | ORAL_TABLET | Freq: Every day | ORAL | 3 refills | Status: DC

## 2018-11-27 NOTE — Telephone Encounter (Signed)
Spoke with Francis Campbell today regarding holding his Coumadin five days prior to his procedure.   This was okayed by pharmacy dept.  Patient verbalized understanding.

## 2018-11-28 ENCOUNTER — Encounter: Payer: Medicare Other | Admitting: Gastroenterology

## 2018-11-28 NOTE — Progress Notes (Signed)
I agree with the above note, plan 

## 2018-12-01 ENCOUNTER — Other Ambulatory Visit: Payer: Self-pay | Admitting: *Deleted

## 2018-12-01 MED ORDER — ROSUVASTATIN CALCIUM 10 MG PO TABS: ORAL_TABLET | ORAL | 3 refills | Status: DC

## 2018-12-01 MED ORDER — DILTIAZEM HCL ER COATED BEADS 240 MG PO CP24: ORAL_CAPSULE | ORAL | 3 refills | Status: DC

## 2018-12-03 DIAGNOSIS — H6122 Impacted cerumen, left ear: Secondary | ICD-10-CM | POA: Diagnosis not present

## 2018-12-07 ENCOUNTER — Telehealth: Payer: Self-pay

## 2018-12-07 NOTE — Telephone Encounter (Signed)
Covid-19 travel screening questions  Have you traveled in the last 14 days? No. If yes where?  Do you now or have you had a fever in the last 14 days? No.  Do you have any respiratory symptoms of shortness of breath or cough now or in the last 14 days? No.  Do you have a medical history of Congestive Heart Failure?  Do you have a medical history of lung disease?  Do you have any family members or close contacts with diagnosed or suspected Covid-19? No.   Patient knew about our "No care partner in lobby" policy. He completely understands and had no further questions.

## 2018-12-08 ENCOUNTER — Ambulatory Visit (AMBULATORY_SURGERY_CENTER): Payer: Medicare Other | Admitting: Gastroenterology

## 2018-12-08 ENCOUNTER — Encounter: Payer: Medicare Other | Admitting: Gastroenterology

## 2018-12-08 ENCOUNTER — Encounter: Payer: Self-pay | Admitting: Gastroenterology

## 2018-12-08 ENCOUNTER — Other Ambulatory Visit: Payer: Self-pay

## 2018-12-08 VITALS — BP 132/75 | HR 56 | Temp 98.2°F | Resp 13 | Ht 72.0 in | Wt 196.0 lb

## 2018-12-08 DIAGNOSIS — R1013 Epigastric pain: Secondary | ICD-10-CM

## 2018-12-08 DIAGNOSIS — K2970 Gastritis, unspecified, without bleeding: Secondary | ICD-10-CM | POA: Diagnosis not present

## 2018-12-08 DIAGNOSIS — K2990 Gastroduodenitis, unspecified, without bleeding: Secondary | ICD-10-CM

## 2018-12-08 MED ORDER — SODIUM CHLORIDE 0.9 % IV SOLN: 500.0000 mL | Freq: Once | INTRAVENOUS | Status: DC

## 2018-12-08 NOTE — Patient Instructions (Signed)
Resume Coumadin today, continue daily antiacid, avoid caffinated, carbonated beverages    YOU HAD AN ENDOSCOPIC PROCEDURE TODAY AT [facility]:   Refer to the procedure report that was given to you for any specific questions about what was found during the examination.  If the procedure report does not answer your questions, please call your gastroenterologist to clarify.  If you requested that your care partner not be given the details of your procedure findings, then the procedure report has been included in a sealed envelope for you to review at your convenience later.  YOU SHOULD EXPECT: Some feelings of bloating in the abdomen. Passage of more gas than usual.  Walking can help get rid of the air that was put into your GI tract during the procedure and reduce the bloating. If you had a lower endoscopy (such as a colonoscopy or flexible sigmoidoscopy) you may notice spotting of blood in your stool or on the toilet paper. If you underwent a bowel prep for your procedure, you may not have a normal bowel movement for a few days.  Please Note:  You might notice some irritation and congestion in your nose or some drainage.  This is from the oxygen used during your procedure.  There is no need for concern and it should clear up in a day or so.  SYMPTOMS TO REPORT IMMEDIATELY:   Following upper endoscopy (EGD)  Vomiting of blood or coffee ground material  New chest pain or pain under the shoulder blades  Painful or persistently difficult swallowing  New shortness of breath  Fever of 100F or higher  Black, tarry-looking stools  For urgent or emergent issues, a gastroenterologist can be reached at any hour by calling [phone].   DIET:  We do recommend a small meal at first, but then you may proceed to your regular diet.  Drink plenty of fluids but you should avoid alcoholic beverages for 24 hours.  ACTIVITY:  You should plan to take it easy for the rest of today and you  should NOT DRIVE or use heavy machinery until tomorrow (because of the sedation medicines used during the test).    FOLLOW UP: Our staff will call the number listed on your records the next business day following your procedure to check on you and address any questions or concerns that you may have regarding the information given to you following your procedure. If we do not reach you, we will leave a message.  However, if you are feeling well and you are not experiencing any problems, there is no need to return our call.  We will assume that you have returned to your regular daily activities without incident.  If any biopsies were taken you will be contacted by phone or by letter within the next 1-3 weeks.  Please call us at [phone] if you have not heard about the biopsies in 3 weeks.    SIGNATURES/CONFIDENTIALITY: You and/or your care partner have signed paperwork which will be entered into your electronic medical record.  These signatures attest to the fact that that the information above on your After Visit Summary has been reviewed and is understood.  Full responsibility of the confidentiality of this discharge information lies with you and/or your care-partner.

## 2018-12-08 NOTE — Op Note (Addendum)
[place] Patient Name: Francis Campbell Procedure Date: 12/08/2018 9:16 AM MRN: [id number] Endoscopist: Milus Banister , MD Age: 73 Referring MD:  Date of Birth: 10-26-1945 Gender: Male Account #: [id number] Procedure:                Upper GI endoscopy Indications:              Heartburn Medicines:                Monitored Anesthesia Care Procedure:                Pre-Anesthesia Assessment:                           - Prior to the procedure, a History and Physical                            was performed, and patient medications and                            allergies were reviewed. The patient's tolerance of                            previous anesthesia was also reviewed. The risks                            and benefits of the procedure and the sedation                            options and risks were discussed with the patient.                            All questions were answered, and informed consent                            was obtained. Prior Anticoagulants: The patient has                            taken no previous anticoagulant or antiplatelet                            agents. ASA Grade Assessment: III - A patient with                            severe systemic disease. After reviewing the risks                            and benefits, the patient was deemed in                            satisfactory condition to undergo the procedure.                           After obtaining informed consent, the endoscope was  passed under direct vision. Throughout the                            procedure, the patient's blood pressure, pulse, and                            oxygen saturations were monitored continuously. The                            Endoscope was introduced through the mouth, and                            advanced to the second part of duodenum. The upper                            GI endoscopy was accomplished without  difficulty.                            The patient tolerated the procedure well. Scope In: Scope Out: Findings:                 Mild inflammation characterized by erythema and                            granularity was found in the stomach. Biopsies were                            taken with a cold forceps for histology.                           The exam was otherwise without abnormality. Complications:            No immediate complications. Estimated blood loss:                            None. Estimated Blood Loss:     Estimated blood loss: none. Impression:               - Gastritis. Biopsied to check for H. pylori.                           - The examination was otherwise normal. Recommendation:           - Patient has a contact number available for                            emergencies. The signs and symptoms of potential                            delayed complications were discussed with the                            patient. Return to normal activities tomorrow.                            Written discharge instructions were  provided to the                            patient.                           - Resume previous diet.                           - Continue present medications. Continue once daily                            antiacid medicine and avoid caffinated, carbonated                            beverages.                           - Await pathology results.                           - OK to resume your coumadin today. Milus Banister, MD 12/08/2018 9:32:47 AM This report has been signed electronically.

## 2018-12-08 NOTE — Progress Notes (Signed)
PT taken to PACU. Monitors in place. VSS. Report given to RN. 

## 2018-12-09 ENCOUNTER — Telehealth: Payer: Self-pay

## 2018-12-09 NOTE — Telephone Encounter (Signed)
  Follow up Call-  Call back number 12/08/2018  Post procedure Call Back phone  # [phone]  Permission to leave phone message Yes  Some recent data might be hidden     Patient questions:  Do you have a fever, pain , or abdominal swelling? No. Pain Score  0 *  Have you tolerated food without any problems? Yes.    Have you been able to return to your normal activities? Yes.    Do you have any questions about your discharge instructions: Diet   No. Medications  No. Follow up visit  No.  Do you have questions or concerns about your Care? No.  Actions: * If pain score is 4 or above: No action needed, pain <4.

## 2018-12-12 ENCOUNTER — Telehealth: Payer: Self-pay

## 2018-12-12 NOTE — Telephone Encounter (Signed)

## 2018-12-14 ENCOUNTER — Other Ambulatory Visit: Payer: Self-pay | Admitting: Family Medicine

## 2018-12-15 ENCOUNTER — Ambulatory Visit (INDEPENDENT_AMBULATORY_CARE_PROVIDER_SITE_OTHER): Payer: Medicare Other | Admitting: Pharmacist

## 2018-12-15 DIAGNOSIS — I480 Paroxysmal atrial fibrillation: Secondary | ICD-10-CM

## 2018-12-15 DIAGNOSIS — Z5181 Encounter for therapeutic drug level monitoring: Secondary | ICD-10-CM

## 2018-12-15 LAB — POCT INR: INR: 1.9 — AB (ref 2.0–3.0)

## 2018-12-15 NOTE — Telephone Encounter (Signed)
Last OV 08/29/18 for cough, No future OV  Last filled 08/11/18, # 30 with 3 refills

## 2018-12-16 ENCOUNTER — Telehealth: Payer: Self-pay | Admitting: Gastroenterology

## 2018-12-16 NOTE — Telephone Encounter (Signed)
Pt is calling to know if his pathology results are back. His procedures was on 3-23.

## 2018-12-16 NOTE — Telephone Encounter (Signed)
Patient notified that there were no acute findings of H pylori or cancer.  He is advised that Dr. Ardis Hughs will send him a letter in the next few days. He will call back for additional questions or concerns.

## 2018-12-17 ENCOUNTER — Encounter: Payer: Self-pay | Admitting: Gastroenterology

## 2018-12-23 ENCOUNTER — Ambulatory Visit: Payer: Medicare Other | Admitting: Physician Assistant

## 2018-12-24 ENCOUNTER — Other Ambulatory Visit: Payer: Self-pay

## 2018-12-24 ENCOUNTER — Ambulatory Visit (INDEPENDENT_AMBULATORY_CARE_PROVIDER_SITE_OTHER): Payer: Medicare Other | Admitting: Family Medicine

## 2018-12-24 DIAGNOSIS — L02612 Cutaneous abscess of left foot: Secondary | ICD-10-CM | POA: Diagnosis not present

## 2018-12-24 NOTE — Progress Notes (Signed)
Patient ID: Francis Campbell, male   DOB: 1945-12-05, 73 y.o.   MRN: [id number]  Virtual Visit via Video Note  I connected with Francis Campbell on 12/24/18 at  9:15 AM EDT by a video enabled telemedicine application and verified that I am speaking with the correct person using two identifiers.  Location patient: home Location provider:work or home office Persons participating in the virtual visit: patient, provider  I discussed the limitations of evaluation and management by telemedicine and the availability of in person appointments. The patient expressed understanding and agreed to proceed.   HPI:  Patient complains of left great toe pain along with some redness and swelling and pustule on the medial aspect.  He states Sunday he was walking in his yard barefoot.  He does recall having a little bit of pain involving the toe then but denies any specific injury.  No bleeding.  No definite foreign bodies.  He noticed some increased swelling and redness by yesterday.  He has not had any fevers or chills.  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  Denies any other skin rash.  Been applying some topical Neosporin and hydrogen peroxide   ROS: See pertinent positives and negatives per HPI.  Past Medical History:  Diagnosis Date  . Allergy   . Aortic regurgitation   . Arthritis   . Asthma    only as child  . Atrial flutter ([place])    a. Dx 09/2012.  . Crohn disease ([place])    Remotely - in the 1990s  . Esophageal reflux    Remotely  . History of kidney stones   . Hypercholesteremia   . Hypertension   . Paroxysmal atrial fibrillation ([facility])    Takes PRN flecainide  . 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  [name] TIA (transient ischemic attack)    ~2005 - had visual symptoms, was told by [name] this may have been a TIA but it's unclear  . Valvular heart disease    Mild AI, ASclerosis without AS by echo 10/2011    Past Surgical History:  Procedure Laterality Date  .  CARDIOVERSION N/A 05/08/2013   Procedure: CARDIOVERSION;  Surgeon: Darlin Coco, MD;  Location: [facility] ENDOSCOPY;  Service: Cardiovascular;  Laterality: N/A;  . CYSTOSCOPY/RETROGRADE/URETEROSCOPY/STONE EXTRACTION WITH BASKET Right 02/12/2013   Procedure: CYSTOSCOPY/RETROGRADE/URETEROSCOPY/STONE EXTRACTION WITH BASKET, WITH  STENT PLACEMENT RIGHT URETERAL DILATION;  Surgeon: [name], MD;  Location: [facility] ORS;  Service: Urology;  Laterality: Right;  . EYE SURGERY Bilateral 1950, 1960, 1964   left, right, left  . HERNIA REPAIR  2005  . KNEE SURGERY Bilateral 1990, 2000  . LITHOTRIPSY    . ROTATOR CUFF REPAIR Left 2000  . TONSILLECTOMY      Family History  Problem Relation Age of Onset  . Stroke Mother   . Hypertension Mother   . Kidney disease Mother        stones  . Nephrolithiasis Brother   . Arthritis Paternal Grandmother   . Heart disease Paternal Grandmother   . Leukemia Father   . Heart disease Father   . Nephrolithiasis Father   . Kidney cancer Son   . Appendicitis Son   . Heart attack Neg Hx   . Colon cancer Neg Hx   . Esophageal cancer Neg Hx   . Pancreatic cancer Neg Hx   . Stomach cancer Neg Hx   . Liver disease Neg Hx     SOCIAL HX: Non-smoker  Current Outpatient Medications:  .  ALPRAZolam (XANAX) 0.25 MG tablet, TAKE 1 TABLET BY MOUTH AT BEDTIME AS NEEDED FOR ANXIETY, Disp: 30 tablet, Rfl: 3 .  Cholecalciferol (VITAMIN D3) 1000 UNITS CAPS, Take 2 capsules by mouth every morning. , Disp: , Rfl:  .  diltiazem (CARTIA XT) 240 MG 24 hr capsule, Take 240 mg by mouth in the morning, Disp: 90 capsule, Rfl: 3 .  flecainide (TAMBOCOR) 100 MG tablet, Take 1 tablet (100 mg total) by mouth 2 (two) times daily., Disp: 180 tablet, Rfl: 0 .  metoprolol succinate (TOPROL-XL) 25 MG 24 hr tablet, Take 1 tablet (25 mg total) by mouth daily. Take with or immediately following a meal., Disp: 90 tablet, Rfl: 3 .  Multiple Vitamin (MULTIVITAMIN) tablet, Take 1 tablet by mouth daily.,  Disp: , Rfl:  .  potassium citrate (UROCIT-K) 10 MEQ (1080 MG) SR tablet, Take 10 mEq by mouth 3 (three) times daily., Disp: , Rfl: 11 .  rosuvastatin (CRESTOR) 10 MG tablet, TAKE 1 TABLET EVERY DAY, Disp: 90 tablet, Rfl: 3 .  Turmeric 500 MG CAPS, Take 500 mg by mouth daily., Disp: , Rfl:  .  warfarin (JANTOVEN) 5 MG tablet, TAKE 1-2 TABLETS DAILY AS  DIRECTED BY COUMADIN CLINIC, Disp: 120 tablet, Rfl: 1  EXAM:  VITALS per patient if applicable:  GENERAL: alert, oriented, appears well and in no acute distress  HEENT: atraumatic, conjunttiva clear, no obvious abnormalities on inspection of external nose and ears  NECK: normal movements of the head and neck  LUNGS: on inspection no signs of respiratory distress, breathing rate appears normal, no obvious gross SOB, gasping or wheezing  CV: no obvious cyanosis  MS: moves all visible extremities without noticeable abnormality  PSYCH/NEURO: pleasant and cooperative, no obvious depression or anxiety, speech and thought processing grossly intact  Skin:   Left great toe was observed on video.  He does have some swelling and has a very small yellowish pustule which appears to be about 2 mm diameter.  Some mild surrounding erythema  ASSESSMENT AND PLAN:  Discussed the following assessment and plan:  Probable small abscess left great toe with some mild surrounding cellulitis changes.  Past history of MRSA.  Cannot rule out foreign body with history as above -Recommend warm salt water soaks several times daily -Start doxycycline 100 mg twice daily for 10 days -If not improving over the next couple days may need incision and drainage -Elevate toe frequently -Touch base for any persistent or worsening symptoms     I discussed the assessment and treatment plan with the patient. The patient was provided an opportunity to ask questions and all were answered. The patient agreed with the plan and demonstrated an understanding of the  instructions.   The patient was advised to call back or seek an in-person evaluation if the symptoms worsen or if the condition fails to improve as anticipated   [name], MD

## 2018-12-26 ENCOUNTER — Encounter: Payer: Self-pay | Admitting: Family Medicine

## 2018-12-29 ENCOUNTER — Other Ambulatory Visit: Payer: Self-pay

## 2018-12-29 MED ORDER — DOXYCYCLINE HYCLATE 100 MG PO TABS: 100.0000 mg | ORAL_TABLET | Freq: Two times a day (BID) | ORAL | 0 refills | Status: DC

## 2018-12-30 ENCOUNTER — Telehealth: Payer: Self-pay | Admitting: Physician Assistant

## 2018-12-30 NOTE — Telephone Encounter (Signed)
Call home phone/ my chart/ consented to virtual/ pre reg completed

## 2018-12-31 ENCOUNTER — Telehealth (INDEPENDENT_AMBULATORY_CARE_PROVIDER_SITE_OTHER): Payer: Medicare Other | Admitting: Physician Assistant

## 2018-12-31 ENCOUNTER — Telehealth: Payer: Self-pay

## 2018-12-31 ENCOUNTER — Encounter: Payer: Self-pay | Admitting: Physician Assistant

## 2018-12-31 VITALS — BP 120/73 | HR 63 | Ht 73.0 in | Wt 195.0 lb

## 2018-12-31 DIAGNOSIS — I480 Paroxysmal atrial fibrillation: Secondary | ICD-10-CM

## 2018-12-31 DIAGNOSIS — E785 Hyperlipidemia, unspecified: Secondary | ICD-10-CM

## 2018-12-31 DIAGNOSIS — I10 Essential (primary) hypertension: Secondary | ICD-10-CM

## 2018-12-31 MED ORDER — FLECAINIDE ACETATE 100 MG PO TABS: 100.0000 mg | ORAL_TABLET | Freq: Two times a day (BID) | ORAL | 1 refills | Status: DC

## 2018-12-31 NOTE — Patient Instructions (Signed)
Medication Instructions:   Your physician recommends that you continue on your current medications as directed. Please refer to the Current Medication list given to you today.  If you need a refill on your cardiac medications before your next appointment, please call your pharmacy.   Lab work: None ordered at this time of appointment   If you have labs (blood work) drawn today and your tests are completely normal, you will receive your results only by: [name] MyChart Message (if you have MyChart) OR . A paper copy in the mail If you have any lab test that is abnormal or we need to change your treatment, we will call you to review the results.  Testing/Procedures:  None ordered at this time of appointment   Follow-Up: At [facility], you and your health needs are our priority.  As part of our continuing mission to provide you with exceptional heart care, we have created designated Provider Care Teams.  These Care Teams include your primary Cardiologist (physician) and Advanced Practice Providers (APPs -  Physician Assistants and Nurse Practitioners) who all work together to provide you with the care you need, when you need it. You will need a follow up appointment in 6 months.  Please call our office 2 months in advance to schedule this appointment.  You may see Skeet Latch, MD or one of the following Advanced Practice Providers on your designated Care Team:   Kerin Ransom, PA-C Roby Lofts, [place] . Sande Rives, PA-C  Any Other Special Instructions Will Be Listed Below (If Applicable).

## 2018-12-31 NOTE — Progress Notes (Signed)
Virtual Visit via Telephone Note   This visit type was conducted due to national recommendations for restrictions regarding the COVID-19 Pandemic (e.g. social distancing) in an effort to limit this patient's exposure and mitigate transmission in our community.  Due to his co-morbid illnesses, this patient is at least at moderate risk for complications without adequate follow up.  This format is felt to be most appropriate for this patient at this time.  The patient did not have access to video technology/had technical difficulties with video requiring transitioning to audio format only (telephone).  All issues noted in this document were discussed and addressed.  No physical exam could be performed with this format.  Please refer to the patient's chart for his  consent to telehealth for [facility].   Evaluation Performed:  Follow-up visit  Date:  12/31/2018   ID:  Francis, Campbell [birth year], MRN [id number]  Patient Location: Home Provider Location: Home  PCP:  [name], MD  Cardiologist:  Skeet Latch, MD  Electrophysiologist:  None   Chief Complaint:  followup  History of Present Illness:    Francis Campbell is a 73 y.o. male with PAF on coumadin, HTN, HLD and Crohn's disease.  He was previously a patient of [name].  He had a negative Myoview in 2013 and echocardiogram in February 2013 showed EF of 60% with mild AI.  He underwent ablation in 2014 and elected to use flecainide as a pill in the pocket.  He underwent DCCV August 2014.  He had noted increasing episodes of atrial fibrillation and was started out daily flecainide in August 2017.  Due to bradycardia, metoprolol was reduced.  He was seen in the ED in December 2018 for atypical chest pain.  Exercise Myoview in January 2019 was negative.  Last echocardiogram in April 2019 shows EF 60 to 65%, mild aortic regurgitation, peak PA pressure 31 mmHg.  The patient does not have symptoms concerning for  COVID-19 infection (fever, chills, cough, or new shortness of breath).    Past Medical History:  Diagnosis Date  . Allergy   . Aortic regurgitation   . Arthritis   . Asthma    only as child  . Atrial flutter ([place])    a. Dx 09/2012.  . Crohn disease ([place])    Remotely - in the 1990s  . Esophageal reflux    Remotely  . History of kidney stones   . Hypercholesteremia   . Hypertension   . Paroxysmal atrial fibrillation (HCC)    Takes PRN flecainide  . 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  [name] TIA (transient ischemic attack)    ~2005 - had visual symptoms, was told by [name] this may have been a TIA but it's unclear  . Valvular heart disease    Mild AI, ASclerosis without AS by echo 10/2011   Past Surgical History:  Procedure Laterality Date  . CARDIOVERSION N/A 05/08/2013   Procedure: CARDIOVERSION;  Surgeon: Darlin Coco, MD;  Location: [facility] ENDOSCOPY;  Service: Cardiovascular;  Laterality: N/A;  . CYSTOSCOPY/RETROGRADE/URETEROSCOPY/STONE EXTRACTION WITH BASKET Right 02/12/2013   Procedure: CYSTOSCOPY/RETROGRADE/URETEROSCOPY/STONE EXTRACTION WITH BASKET, WITH  STENT PLACEMENT RIGHT URETERAL DILATION;  Surgeon: [name], MD;  Location: [facility] ORS;  Service: Urology;  Laterality: Right;  . EYE SURGERY Bilateral 1950, 1960, 1964   left, right, left  . HERNIA REPAIR  2005  . KNEE SURGERY Bilateral 1990, 2000  . LITHOTRIPSY    .  ROTATOR CUFF REPAIR Left 2000  . TONSILLECTOMY       Current Meds  Medication Sig  . ALPRAZolam (XANAX) 0.25 MG tablet TAKE 1 TABLET BY MOUTH AT BEDTIME AS NEEDED FOR ANXIETY  . Cholecalciferol (VITAMIN D3) 1000 UNITS CAPS Take 2 capsules by mouth every morning.   . diltiazem (CARTIA XT) 240 MG 24 hr capsule Take 240 mg by mouth in the morning  . flecainide (TAMBOCOR) 100 MG tablet Take 1 tablet (100 mg total) by mouth 2 (two) times daily.  . metoprolol succinate (TOPROL-XL) 25 MG 24 hr tablet Take 1 tablet (25 mg total)  by mouth daily. Take with or immediately following a meal.  . Multiple Vitamin (MULTIVITAMIN) tablet Take 1 tablet by mouth daily.  . potassium citrate (UROCIT-K) 10 MEQ (1080 MG) SR tablet Take 10 mEq by mouth 3 (three) times daily.  . rosuvastatin (CRESTOR) 10 MG tablet TAKE 1 TABLET EVERY DAY  . Turmeric 500 MG CAPS Take 500 mg by mouth daily.  [name] warfarin (JANTOVEN) 5 MG tablet TAKE 1-2 TABLETS DAILY AS  DIRECTED BY COUMADIN CLINIC  . [DISCONTINUED] flecainide (TAMBOCOR) 100 MG tablet Take 1 tablet (100 mg total) by mouth 2 (two) times daily.     Allergies:   Chocolate; Chocolate flavor; Dextroamphetamine; Dextromethorphan; Flomax [tamsulosin hcl]; Floxin [ofloxacin]; Guaifenesin; Lisinopril; Tavist allergy [clemastine fumarate]; Tamsulosin; Amiodarone; and Robitussin cf [pseudoephedrine-dm-gg]   Social History   Tobacco Use  . Smoking status: Never Smoker  . Smokeless tobacco: Never Used  Substance Use Topics  . Alcohol use: No  . Drug use: No     Family Hx: The patient's family history includes Appendicitis in his son; Arthritis in his paternal grandmother; Heart disease in his father and paternal grandmother; Hypertension in his mother; Kidney cancer in his son; Kidney disease in his mother; Leukemia in his father; Nephrolithiasis in his brother and father; Stroke in his mother. There is no history of Heart attack, Colon cancer, Esophageal cancer, Pancreatic cancer, Stomach cancer, or Liver disease.  ROS:   Please see the history of present illness.     All other systems reviewed and are negative.   Prior CV studies:   The following studies were reviewed today:  Myoview 09/25/2017 Study Highlights    The patient walked for a total of 10 minutes and 15 seconds under standard Bruce protocol treadmill test. The peak heart rate is 142 which is 96% predicted maximal heart rate.  There were no ST or T wave changes to suggest ischemia.  Nuclear stress EF: 61%. The left  ventricular ejection fraction is normal (55-65%).  This is a low risk study. The study is normal.      Echo 01/03/2018 LV EF: 60% -   65% Study Conclusions  - Left ventricle: The cavity size was normal. Wall thickness was   increased in a pattern of mild LVH. Systolic function was normal.   The estimated ejection fraction was in the range of 60% to 65%.   Wall motion was normal; there were no regional wall motion   abnormalities. Left ventricular diastolic function parameters   were normal. - Aortic valve: Dilated aortic sinus. There was mild regurgitation. - Atrial septum: No defect or patent foramen ovale was identified. - Pulmonary arteries: PA peak pressure: 31 mm Hg (S).  Labs/Other Tests and Data Reviewed:    EKG:  An ECG dated 08/27/2018 was personally reviewed today and demonstrated:  Normal sinus rhythm without significant ST-T wave changes.  Recent  Labs: 04/02/2018: Hemoglobin 16.4; Platelets 182.0 08/27/2018: ALT 22; BUN 24; Creatinine, Ser 0.90; Magnesium 2.3; Potassium 5.0; Sodium 144   Recent Lipid Panel Lab Results  Component Value Date/Time   CHOL 166 08/27/2018 11:25 AM   TRIG 167 (H) 08/27/2018 11:25 AM   HDL 38 (L) 08/27/2018 11:25 AM   CHOLHDL 4.4 08/27/2018 11:25 AM   CHOLHDL 5 10/07/2017 03:10 PM   LDLCALC 95 08/27/2018 11:25 AM   LDLDIRECT 96.0 10/07/2017 03:10 PM    Wt Readings from Last 3 Encounters:  12/31/18 195 lb (88.5 kg)  12/08/18 196 lb (88.9 kg)  11/26/18 196 lb (88.9 kg)     Objective:    Vital Signs:  BP 120/73   Pulse 63   Ht 6' 1"  (1.854 m)   Wt 195 lb (88.5 kg)   BMI 25.73 kg/m    Well nourished, well developed male in no acute distress.   ASSESSMENT & PLAN:    1. PAF: On Coumadin.  Maintaining sinus rhythm using diltiazem, metoprolol and flecainide.  He has not had any further recurrence of atrial fibrillation in the past 5 months.  We will continue on the current medication.  2. Hypertension: Blood pressure very  well controlled on current therapy.  3. Hyperlipidemia: On Crestor 10 mg daily  COVID-19 Education: The signs and symptoms of COVID-19 were discussed with the patient and how to seek care for testing (follow up with PCP or arrange E-visit).  The importance of social distancing was discussed today.  Time:   Today, I have spent 4 minutes with the patient with telehealth technology discussing the above problems.     Medication Adjustments/Labs and Tests Ordered: Current medicines are reviewed at length with the patient today.  Concerns regarding medicines are outlined above.   Tests Ordered: No orders of the defined types were placed in this encounter.   Medication Changes: Meds ordered this encounter  Medications  . flecainide (TAMBOCOR) 100 MG tablet    Sig: Take 1 tablet (100 mg total) by mouth 2 (two) times daily.    Dispense:  180 tablet    Refill:  1    Disposition:  Follow up in 6 month(s)  Signed, [name], PA  12/31/2018 8:52 AM     [facility]

## 2018-12-31 NOTE — Telephone Encounter (Signed)
Virtual Visit Pre-Appointment Phone Call  Steps For Call:  1. Confirm consent - "In the setting of the current Covid19 crisis, you are scheduled for a TELEPHONE visit with your provider on 12/31/2018 at 8:00AM.  Just as we do with many in-office visits, in order for you to participate in this visit, we must obtain consent.  If you'd like, I can send this to your mychart (if signed up) or email for you to review.  Otherwise, I can obtain your verbal consent now.  All virtual visits are billed to your insurance company just like a normal visit would be.  By agreeing to a virtual visit, we'd like you to understand that the technology does not allow for your provider to perform an examination, and thus may limit your provider's ability to fully assess your condition.  Finally, though the technology is pretty good, we cannot assure that it will always work on either your or our end, and in the setting of a video visit, we may have to convert it to a phone-only visit.  In either situation, we cannot ensure that we have a secure connection.  Are you willing to proceed?" STAFF: Did the patient verbally acknowledge consent to telehealth visit? Document YES/NO here: YES  2. Confirm the BEST phone number to call the day of the visit by including in appointment notes  3. Give patient instructions for WebEx/MyChart download to smartphone as below or Doximity/Doxy.me if video visit (depending on what platform provider is using)  4. Advise patient to be prepared with their blood pressure, heart rate, weight, any heart rhythm information, their current medicines, and a piece of paper and pen handy for any instructions they may receive the day of their visit  5. Inform patient they will receive a phone call 15 minutes prior to their appointment time (may be from unknown caller ID) so they should be prepared to answer  6. Confirm that appointment type is correct in Epic appointment notes (VIDEO vs PHONE)      TELEPHONE CALL NOTE  Francis Campbell has been deemed a candidate for a follow-up tele-health visit to limit community exposure during the Covid-19 pandemic. I spoke with the patient via phone to ensure availability of phone/video source, confirm preferred email & phone number, and discuss instructions and expectations.  I reminded JUNIOUS RAGONE Campbell to be prepared with any vital sign and/or heart rhythm information that could potentially be obtained via home monitoring, at the time of his visit. I reminded ELIAZ FOUT Campbell to expect a phone call at the time of his visit if his visit.  Jacqulynn Cadet, [place] 12/31/2018 7:51 AM   INSTRUCTIONS FOR DOWNLOADING THE WEBEX APP TO SMARTPHONE  - If Apple, ask patient to go to App Store and type in WebEx in the search bar. [employer], the blue/green circle. If Android, go to [employer] and type in [employer] in the search bar. The app is free but as with any other app downloads, their phone may require them to verify saved payment information or Apple/Android password.  - The patient does NOT have to create an account. - On the day of the visit, the assist will walk the patient through joining the meeting with the meeting number/password.  INSTRUCTIONS FOR DOWNLOADING THE MYCHART APP TO SMARTPHONE  - The patient must first make sure to have activated MyChart and know their login information - If Apple, go to [employer] and type  in [place] in the search bar and download the app. If Android, ask patient to go to [employer] and type in [place] in the search bar and download the app. The app is free but as with any other app downloads, their phone may require them to verify saved payment information or Apple/Android password.  - The patient will need to then log into the app with their MyChart username and password, and select [facility] as their healthcare provider to link the account. When it is time for your visit, go to the  MyChart app, find appointments, and click Begin Video Visit. Be sure to Select Allow for your device to access the Microphone and Camera for your visit. You will then be connected, and your provider will be with you shortly.  **If they have any issues connecting, or need assistance please contact MyChart service desk [phone])**  **If using a computer, in order to ensure the best quality for their visit they will need to use either of the following Internet Browsers: [employer], or Google Chrome**  IF USING DOXIMITY or DOXY.ME - The patient will receive a link just prior to their visit, either by text or email (to be determined day of appointment depending on if it's doxy.me or Doximity).     FULL LENGTH CONSENT FOR TELE-HEALTH VISIT   I hereby voluntarily request, consent and authorize [facility] and its employed or contracted physicians, physician assistants, nurse practitioners or other licensed health care professionals (the Practitioner), to provide me with telemedicine health care services (the "Services") as deemed necessary by the treating Practitioner. I acknowledge and consent to receive the Services by the Practitioner via telemedicine. I understand that the telemedicine visit will involve communicating with the Practitioner through live audiovisual communication technology and the disclosure of certain medical information by electronic transmission. I acknowledge that I have been given the opportunity to request an in-person assessment or other available alternative prior to the telemedicine visit and am voluntarily participating in the telemedicine visit.  I understand that I have the right to withhold or withdraw my consent to the use of telemedicine in the course of my care at any time, without affecting my right to future care or treatment, and that the Practitioner or I may terminate the telemedicine visit at any time. I understand that I have the right to  inspect all information obtained and/or recorded in the course of the telemedicine visit and may receive copies of available information for a reasonable fee.  I understand that some of the potential risks of receiving the Services via telemedicine include:  [name] Delay or interruption in medical evaluation due to technological equipment failure or disruption; . Information transmitted may not be sufficient (e.g. poor resolution of images) to allow for appropriate medical decision making by the Practitioner; and/or  . In rare instances, security protocols could fail, causing a breach of personal health information.  Furthermore, I acknowledge that it is my responsibility to provide information about my medical history, conditions and care that is complete and accurate to the best of my ability. I acknowledge that Practitioner's advice, recommendations, and/or decision may be based on factors not within their control, such as incomplete or inaccurate data provided by me or distortions of diagnostic images or specimens that may result from electronic transmissions. I understand that the practice of medicine is not an exact science and that Practitioner makes no warranties or guarantees regarding treatment outcomes. I acknowledge that I will receive a  copy of this consent concurrently upon execution via email to the email address I last provided but may also request a printed copy by calling the office of [facility].    I understand that my insurance will be billed for this visit.   I have read or had this consent read to me. . I understand the contents of this consent, which adequately explains the benefits and risks of the Services being provided via telemedicine.  . I have been provided ample opportunity to ask questions regarding this consent and the Services and have had my questions answered to my satisfaction. . I give my informed consent for the services to be provided through the use of  telemedicine in my medical care  By participating in this telemedicine visit I agree to the above.

## 2019-01-02 ENCOUNTER — Telehealth: Payer: Self-pay

## 2019-01-02 NOTE — Telephone Encounter (Signed)
lmom for prescreen  

## 2019-01-05 ENCOUNTER — Ambulatory Visit (INDEPENDENT_AMBULATORY_CARE_PROVIDER_SITE_OTHER): Payer: Medicare Other | Admitting: Pharmacist

## 2019-01-05 ENCOUNTER — Other Ambulatory Visit: Payer: Self-pay | Admitting: Physician Assistant

## 2019-01-05 DIAGNOSIS — I480 Paroxysmal atrial fibrillation: Secondary | ICD-10-CM | POA: Diagnosis not present

## 2019-01-05 DIAGNOSIS — Z5181 Encounter for therapeutic drug level monitoring: Secondary | ICD-10-CM | POA: Diagnosis not present

## 2019-01-05 LAB — POCT INR: INR: 2.9 (ref 2.0–3.0)

## 2019-01-05 MED ORDER — WARFARIN SODIUM 5 MG PO TABS: ORAL_TABLET | ORAL | 3 refills | Status: DC

## 2019-01-05 MED ORDER — LOSARTAN POTASSIUM 25 MG PO TABS: 25.0000 mg | ORAL_TABLET | Freq: Every day | ORAL | 3 refills | Status: DC

## 2019-01-05 NOTE — Telephone Encounter (Signed)
Spoke with the pt and not sure why Losartan not on med list but he says he discussed with [name] PA on his televsit to continue the dose he is on and will update his med list... will send in his RX per his request.

## 2019-01-09 ENCOUNTER — Other Ambulatory Visit: Payer: Self-pay

## 2019-01-20 ENCOUNTER — Encounter: Payer: Self-pay | Admitting: Family Medicine

## 2019-01-21 ENCOUNTER — Ambulatory Visit (INDEPENDENT_AMBULATORY_CARE_PROVIDER_SITE_OTHER): Payer: Medicare Other | Admitting: Family Medicine

## 2019-01-21 ENCOUNTER — Other Ambulatory Visit: Payer: Self-pay

## 2019-01-21 DIAGNOSIS — F329 Major depressive disorder, single episode, unspecified: Secondary | ICD-10-CM

## 2019-01-21 DIAGNOSIS — F419 Anxiety disorder, unspecified: Secondary | ICD-10-CM

## 2019-01-21 MED ORDER — SERTRALINE HCL 50 MG PO TABS: 50.0000 mg | ORAL_TABLET | Freq: Every day | ORAL | 3 refills | Status: DC

## 2019-01-21 NOTE — Progress Notes (Signed)
Patient ID: Francis Campbell, male   DOB: 01/13/1946, 72 y.o.   MRN: [id number]  This visit type was conducted due to national recommendations for restrictions regarding the COVID-19 pandemic in an effort to limit this patient's exposure and mitigate transmission in our community.   Virtual Visit via Video Note  I connected with Francis Campbell on 01/21/19 at 10:15 AM EDT by a video enabled telemedicine application and verified that I am speaking with the correct person using two identifiers.  Location patient: home Location provider:work or home office Persons participating in the virtual visit: patient, provider  I discussed the limitations of evaluation and management by telemedicine and the availability of in person appointments. The patient expressed understanding and agreed to proceed.   HPI: Patient has been battling some depression symptoms recently.  He feels some of this is related to the current pandemic crisis.  He also has had recent stressor of a disturbing call.  Someone basically was making a threat based on something from the Internet that happened 15 years ago.  They were requesting payment over $900 to conceal information.  Patient did consult his pastor regarding this and got some very good advice.  He has had some difficulty sleeping.  He has had some combination of anxiety and depression symptoms.  Denies any suicidal ideation.  has taken sertraline and done well on that in the past.   ROS: See pertinent positives and negatives per HPI.  Past Medical History:  Diagnosis Date  . Allergy   . Aortic regurgitation   . Arthritis   . Asthma    only as child  . Atrial flutter ([place])    a. Dx 09/2012.  . Crohn disease ([place])    Remotely - in the 1990s  . Esophageal reflux    Remotely  . History of kidney stones   . Hypercholesteremia   . Hypertension   . Paroxysmal atrial fibrillation (HCC)    Takes PRN flecainide  . 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 Prominence  seen on MRI 09/2012.  [name] TIA (transient ischemic attack)    ~2005 - had visual symptoms, was told by [name] this may have been a TIA but it's unclear  . Valvular heart disease    Mild AI, ASclerosis without AS by echo 10/2011    Past Surgical History:  Procedure Laterality Date  . CARDIOVERSION N/A 05/08/2013   Procedure: CARDIOVERSION;  Surgeon: Darlin Coco, MD;  Location: [facility] ENDOSCOPY;  Service: Cardiovascular;  Laterality: N/A;  . CYSTOSCOPY/RETROGRADE/URETEROSCOPY/STONE EXTRACTION WITH BASKET Right 02/12/2013   Procedure: CYSTOSCOPY/RETROGRADE/URETEROSCOPY/STONE EXTRACTION WITH BASKET, WITH  STENT PLACEMENT RIGHT URETERAL DILATION;  Surgeon: [name], MD;  Location: [facility];  Service: Urology;  Laterality: Right;  . EYE SURGERY Bilateral 1950, 1960, 1964   left, right, left  . HERNIA REPAIR  2005  . KNEE SURGERY Bilateral 1990, 2000  . LITHOTRIPSY    . ROTATOR CUFF REPAIR Left 2000  . TONSILLECTOMY      Family History  Problem Relation Age of Onset  . Stroke Mother   . Hypertension Mother   . Kidney disease Mother        stones  . Nephrolithiasis Brother   . Arthritis Paternal Grandmother   . Heart disease Paternal Grandmother   . Leukemia Father   . Heart disease Father   . Nephrolithiasis Father   . Kidney cancer Son   . Appendicitis Son   . Heart attack Neg Hx   .  Colon cancer Neg Hx   . Esophageal cancer Neg Hx   . Pancreatic cancer Neg Hx   . Stomach cancer Neg Hx   . Liver disease Neg Hx     SOCIAL HX: Non-smoker   Current Outpatient Medications:  .  ALPRAZolam (XANAX) 0.25 MG tablet, TAKE 1 TABLET BY MOUTH AT BEDTIME AS NEEDED FOR ANXIETY, Disp: 30 tablet, Rfl: 3 .  Cholecalciferol (VITAMIN D3) 1000 UNITS CAPS, Take 2 capsules by mouth every morning. , Disp: , Rfl:  .  diltiazem (CARTIA XT) 240 MG 24 hr capsule, Take 240 mg by mouth in the morning, Disp: 90 capsule, Rfl: 3 .  doxycycline (VIBRA-TABS) 100 MG tablet, Take 1 tablet (100 mg total)  by mouth 2 (two) times daily. (Patient not taking: Reported on 12/31/2018), Disp: 20 tablet, Rfl: 0 .  flecainide (TAMBOCOR) 100 MG tablet, Take 1 tablet (100 mg total) by mouth 2 (two) times daily., Disp: 180 tablet, Rfl: 1 .  losartan (COZAAR) 25 MG tablet, Take 1 tablet (25 mg total) by mouth daily., Disp: 90 tablet, Rfl: 3 .  metoprolol succinate (TOPROL-XL) 25 MG 24 hr tablet, Take 1 tablet (25 mg total) by mouth daily. Take with or immediately following a meal., Disp: 90 tablet, Rfl: 3 .  Multiple Vitamin (MULTIVITAMIN) tablet, Take 1 tablet by mouth daily., Disp: , Rfl:  .  potassium citrate (UROCIT-K) 10 MEQ (1080 MG) SR tablet, Take 10 mEq by mouth 3 (three) times daily., Disp: , Rfl: 11 .  rosuvastatin (CRESTOR) 10 MG tablet, TAKE 1 TABLET EVERY DAY, Disp: 90 tablet, Rfl: 3 .  sertraline (ZOLOFT) 50 MG tablet, Take 1 tablet (50 mg total) by mouth daily., Disp: 90 tablet, Rfl: 3 .  Turmeric 500 MG CAPS, Take 500 mg by mouth daily., Disp: , Rfl:  .  warfarin (JANTOVEN) 5 MG tablet, TAKE 1-2 TABLETS DAILY AS  DIRECTED BY COUMADIN CLINIC, Disp: 120 tablet, Rfl: 3  EXAM:  VITALS per patient if applicable:  GENERAL: alert, oriented, appears well and in no acute distress  HEENT: atraumatic, conjunttiva clear, no obvious abnormalities on inspection of external nose and ears  NECK: normal movements of the head and neck  LUNGS: on inspection no signs of respiratory distress, breathing rate appears normal, no obvious gross SOB, gasping or wheezing  CV: no obvious cyanosis  MS: moves all visible extremities without noticeable abnormality  PSYCH/NEURO: pleasant and cooperative, no obvious depression or anxiety, speech and thought processing grossly intact  ASSESSMENT AND PLAN:  Discussed the following assessment and plan:  Depression and anxiety symptoms -We discussed importance of counseling and have strongly suggested he continue with counseling through his pastor and have offered  counseling with one of our behavioral health specialists as well which he will consider -Start back sertraline 50 mg once daily and give some feedback in 3 to 4 weeks.  He is aware this will take a few weeks to see full effect -Follow-up immediately for any worsening depression symptoms or other concerns     I discussed the assessment and treatment plan with the patient. The patient was provided an opportunity to ask questions and all were answered. The patient agreed with the plan and demonstrated an understanding of the instructions.   The patient was advised to call back or seek an in-person evaluation if the symptoms worsen or if the condition fails to improve as anticipated.     [name], MD

## 2019-02-02 ENCOUNTER — Ambulatory Visit (INDEPENDENT_AMBULATORY_CARE_PROVIDER_SITE_OTHER): Payer: Medicare Other | Admitting: Pharmacist

## 2019-02-02 ENCOUNTER — Other Ambulatory Visit: Payer: Self-pay

## 2019-02-02 DIAGNOSIS — I480 Paroxysmal atrial fibrillation: Secondary | ICD-10-CM | POA: Diagnosis not present

## 2019-02-02 DIAGNOSIS — Z5181 Encounter for therapeutic drug level monitoring: Secondary | ICD-10-CM

## 2019-02-02 LAB — POCT INR: INR: 3.7 — AB (ref 2.0–3.0)

## 2019-02-02 NOTE — Telephone Encounter (Signed)
Patient notes switch in manufacturer of warfarin- was on jantoven.  Also notices increase in bleeding with scrapes on arms, takes longer to stop.  Would like to check INR this week.  Appointment moved to today at 10:30.

## 2019-02-12 ENCOUNTER — Encounter: Payer: Self-pay | Admitting: Family Medicine

## 2019-02-13 ENCOUNTER — Other Ambulatory Visit: Payer: Self-pay

## 2019-02-13 MED ORDER — SERTRALINE HCL 50 MG PO TABS: 75.0000 mg | ORAL_TABLET | Freq: Every day | ORAL | 0 refills | Status: DC

## 2019-02-17 ENCOUNTER — Telehealth: Payer: Self-pay

## 2019-02-17 NOTE — Telephone Encounter (Signed)

## 2019-02-20 ENCOUNTER — Ambulatory Visit (INDEPENDENT_AMBULATORY_CARE_PROVIDER_SITE_OTHER): Payer: Medicare Other | Admitting: *Deleted

## 2019-02-20 ENCOUNTER — Other Ambulatory Visit: Payer: Self-pay

## 2019-02-20 DIAGNOSIS — Z5181 Encounter for therapeutic drug level monitoring: Secondary | ICD-10-CM

## 2019-02-20 DIAGNOSIS — I480 Paroxysmal atrial fibrillation: Secondary | ICD-10-CM

## 2019-02-20 LAB — POCT INR: INR: 3.2 — AB (ref 2.0–3.0)

## 2019-02-20 NOTE — Patient Instructions (Signed)
Description   Skip your dose tomorrow, then start taking 41m daily except 17mon Sundays. Recheck in 2-3 weeks.

## 2019-03-02 ENCOUNTER — Telehealth: Payer: Self-pay

## 2019-03-02 NOTE — Telephone Encounter (Signed)

## 2019-03-06 ENCOUNTER — Other Ambulatory Visit: Payer: Self-pay

## 2019-03-06 ENCOUNTER — Ambulatory Visit (INDEPENDENT_AMBULATORY_CARE_PROVIDER_SITE_OTHER): Payer: Medicare Other | Admitting: *Deleted

## 2019-03-06 DIAGNOSIS — I480 Paroxysmal atrial fibrillation: Secondary | ICD-10-CM | POA: Diagnosis not present

## 2019-03-06 DIAGNOSIS — Z5181 Encounter for therapeutic drug level monitoring: Secondary | ICD-10-CM

## 2019-03-06 LAB — POCT INR: INR: 2.2 (ref 2.0–3.0)

## 2019-03-06 NOTE — Patient Instructions (Signed)
Description   Continue  taking 37m daily except 115mon Sundays. Recheck in 3 weeks.

## 2019-03-17 ENCOUNTER — Encounter: Payer: Self-pay | Admitting: Family Medicine

## 2019-03-23 ENCOUNTER — Telehealth: Payer: Self-pay

## 2019-03-23 NOTE — Telephone Encounter (Signed)

## 2019-04-03 ENCOUNTER — Other Ambulatory Visit: Payer: Self-pay

## 2019-04-03 ENCOUNTER — Ambulatory Visit (INDEPENDENT_AMBULATORY_CARE_PROVIDER_SITE_OTHER): Payer: Medicare Other | Admitting: *Deleted

## 2019-04-03 DIAGNOSIS — Z5181 Encounter for therapeutic drug level monitoring: Secondary | ICD-10-CM | POA: Diagnosis not present

## 2019-04-03 DIAGNOSIS — I480 Paroxysmal atrial fibrillation: Secondary | ICD-10-CM | POA: Diagnosis not present

## 2019-04-03 LAB — POCT INR: INR: 2.4 (ref 2.0–3.0)

## 2019-04-03 NOTE — Patient Instructions (Signed)
Description   Continue  taking 78m daily except 163mon Sundays. Recheck in 4 weeks.

## 2019-04-12 ENCOUNTER — Other Ambulatory Visit: Payer: Self-pay | Admitting: Family Medicine

## 2019-04-13 NOTE — Telephone Encounter (Signed)
Last OV 01/21/19, No future OV  Last filled 12/15/18, # 30 with 3 refills

## 2019-04-22 DIAGNOSIS — N5201 Erectile dysfunction due to arterial insufficiency: Secondary | ICD-10-CM | POA: Diagnosis not present

## 2019-04-22 DIAGNOSIS — N200 Calculus of kidney: Secondary | ICD-10-CM | POA: Diagnosis not present

## 2019-04-22 DIAGNOSIS — R8279 Other abnormal findings on microbiological examination of urine: Secondary | ICD-10-CM | POA: Diagnosis not present

## 2019-04-22 DIAGNOSIS — H6123 Impacted cerumen, bilateral: Secondary | ICD-10-CM | POA: Diagnosis not present

## 2019-04-30 ENCOUNTER — Encounter: Payer: Self-pay | Admitting: Family Medicine

## 2019-05-01 ENCOUNTER — Ambulatory Visit (INDEPENDENT_AMBULATORY_CARE_PROVIDER_SITE_OTHER): Payer: Medicare Other | Admitting: Pharmacist

## 2019-05-01 ENCOUNTER — Other Ambulatory Visit: Payer: Self-pay

## 2019-05-01 DIAGNOSIS — I480 Paroxysmal atrial fibrillation: Secondary | ICD-10-CM | POA: Diagnosis not present

## 2019-05-01 DIAGNOSIS — Z5181 Encounter for therapeutic drug level monitoring: Secondary | ICD-10-CM

## 2019-05-01 LAB — POCT INR: INR: 2.3 (ref 2.0–3.0)

## 2019-05-08 ENCOUNTER — Other Ambulatory Visit: Payer: Self-pay | Admitting: Family Medicine

## 2019-05-28 ENCOUNTER — Ambulatory Visit (INDEPENDENT_AMBULATORY_CARE_PROVIDER_SITE_OTHER): Payer: Medicare Other

## 2019-05-28 ENCOUNTER — Other Ambulatory Visit: Payer: Self-pay

## 2019-05-28 DIAGNOSIS — H25813 Combined forms of age-related cataract, bilateral: Secondary | ICD-10-CM | POA: Diagnosis not present

## 2019-05-28 DIAGNOSIS — H52223 Regular astigmatism, bilateral: Secondary | ICD-10-CM | POA: Diagnosis not present

## 2019-05-28 DIAGNOSIS — H524 Presbyopia: Secondary | ICD-10-CM | POA: Diagnosis not present

## 2019-05-28 DIAGNOSIS — H50012 Monocular esotropia, left eye: Secondary | ICD-10-CM | POA: Diagnosis not present

## 2019-05-28 DIAGNOSIS — Z23 Encounter for immunization: Secondary | ICD-10-CM | POA: Diagnosis not present

## 2019-05-28 DIAGNOSIS — H5213 Myopia, bilateral: Secondary | ICD-10-CM | POA: Diagnosis not present

## 2019-05-28 NOTE — Patient Instructions (Signed)
Health Maintenance Due  Topic Date Due  . INFLUENZA VACCINE  04/18/2019    Depression screen [facility] 2/9 10/11/2016 10/11/2016 02/21/2016  Decreased Interest 0 0 0  Down, Depressed, Hopeless 0 0 0  PHQ - 2 Score 0 0 0  Some recent data might be hidden

## 2019-06-12 ENCOUNTER — Ambulatory Visit (INDEPENDENT_AMBULATORY_CARE_PROVIDER_SITE_OTHER): Payer: Medicare Other | Admitting: Pharmacist

## 2019-06-12 ENCOUNTER — Other Ambulatory Visit: Payer: Self-pay

## 2019-06-12 DIAGNOSIS — I480 Paroxysmal atrial fibrillation: Secondary | ICD-10-CM | POA: Diagnosis not present

## 2019-06-12 DIAGNOSIS — Z5181 Encounter for therapeutic drug level monitoring: Secondary | ICD-10-CM

## 2019-06-12 LAB — POCT INR: INR: 2.3 (ref 2.0–3.0)

## 2019-06-25 ENCOUNTER — Other Ambulatory Visit: Payer: Self-pay | Admitting: Physician Assistant

## 2019-07-14 ENCOUNTER — Ambulatory Visit (INDEPENDENT_AMBULATORY_CARE_PROVIDER_SITE_OTHER): Payer: Medicare Other | Admitting: Cardiovascular Disease

## 2019-07-14 ENCOUNTER — Ambulatory Visit (INDEPENDENT_AMBULATORY_CARE_PROVIDER_SITE_OTHER): Payer: Medicare Other | Admitting: Pharmacist Clinician (PhC)/ Clinical Pharmacy Specialist

## 2019-07-14 ENCOUNTER — Encounter: Payer: Self-pay | Admitting: Cardiovascular Disease

## 2019-07-14 ENCOUNTER — Other Ambulatory Visit: Payer: Self-pay

## 2019-07-14 VITALS — BP 138/70 | HR 60 | Ht 73.0 in | Wt 184.0 lb

## 2019-07-14 DIAGNOSIS — I10 Essential (primary) hypertension: Secondary | ICD-10-CM | POA: Diagnosis not present

## 2019-07-14 DIAGNOSIS — I480 Paroxysmal atrial fibrillation: Secondary | ICD-10-CM

## 2019-07-14 DIAGNOSIS — E785 Hyperlipidemia, unspecified: Secondary | ICD-10-CM

## 2019-07-14 DIAGNOSIS — Z5181 Encounter for therapeutic drug level monitoring: Secondary | ICD-10-CM

## 2019-07-14 LAB — POCT INR: INR: 2.2 (ref 2.0–3.0)

## 2019-07-14 NOTE — Progress Notes (Signed)
Cardiology Office Note   Date:  07/14/2019   ID:  Francis, Campbell [birth year], MRN [id number]  PCP:  Francis Post, MD  Cardiologist:   Francis Latch, MD  Cardiology APP: Francis Deforest, PA  No chief complaint on file.    History of Present Illness: Francis Campbell is a 73 y.o. male with paroxysmal atrial fibrillation on coumadin, hypertension, hyperlipidemia, and Crohn's disease who presents for follow up.  Francis Campbell was previously a patient of [name].   Francis Campbell was evaluated by [name] for catheter ablation in 2014 and elected to use flecainide as a "pill in the pocket."  He underwent DCCV 04/2013.  He had a negative Myoview 09/2011 and an echo 10/2011 that revealed LVEF 60% with mild AR.  He noted increasing episodes of atrial fibrillation and started on daily flecainide 04/2016.  He developed bradycardia, so metoprolol was reduced.     Francis Campbell was seen in the ED 08/2017 with intermittent chest tightness.  His symptoms were felt to be atypical and EKG was unremarkable.  Troponin and chest x-ray are within normal limits.  He followed up in clinic and was referred for an exercise Myoview 09/2017 that was negative for ischemia.  He saw Francis Campbell, [place], on 12/2017 after a one hour episode of atrial fibrillation.  He was instructed to take an extra dose of flecainide as needed. At his appointment 08/2018 he had an episode of recurrent atrial fibrillation.  He last saw Francis Campbell, [place], on 12/2018 and was doing well.  Overall he has been feeling well.  Last week he had an atrial fibrillation episode that lasted for 4-5 hours.  He felt tired and went to bed.  When he got up it had resolved.  He thought about taking an extra flecainide but did not. His heart rate was 95 at the time and his BP was lower than usual.  He thinks that caffeine and stress are what triggered his atrial fibrillation episodes.  He remains active but doesn't get formal exercise.  He doesn't have any chest pain or  shortness of breath.  At home his BP has been around 130/70s.  Heart rate is in the 50-60s.  He has been working on losing weight.  He stopped eating after dinner and tries to eat more sensibly.  So far he has lost 11 pounds.   Past Medical History:  Diagnosis Date  . Allergy   . Aortic regurgitation   . Arthritis   . Asthma    only as child  . Atrial flutter ([place])    a. Dx 09/2012.  . Crohn disease ([place])    Remotely - in the 1990s  . Esophageal reflux    Remotely  . History of kidney stones   . Hypercholesteremia   . Hypertension   . Paroxysmal atrial fibrillation (HCC)    Takes PRN flecainide  . 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  Francis Campbell TIA (transient ischemic attack)    ~2005 - had visual symptoms, was told by [name] this may have been a TIA but it's unclear  . Valvular heart disease    Mild AI, ASclerosis without AS by echo 10/2011    Past Surgical History:  Procedure Laterality Date  . CARDIOVERSION N/A 05/08/2013   Procedure: CARDIOVERSION;  Surgeon: Darlin Coco, MD;  Location: [facility];  Service: Cardiovascular;  Laterality: N/A;  . CYSTOSCOPY/RETROGRADE/URETEROSCOPY/STONE EXTRACTION WITH BASKET Right  02/12/2013   Procedure: CYSTOSCOPY/RETROGRADE/URETEROSCOPY/STONE EXTRACTION WITH BASKET, WITH  STENT PLACEMENT RIGHT URETERAL DILATION;  Surgeon: [name], MD;  Location: [facility];  Service: Urology;  Laterality: Right;  . EYE SURGERY Bilateral 1950, 1960, 1964   left, right, left  . HERNIA REPAIR  2005  . KNEE SURGERY Bilateral 1990, 2000  . LITHOTRIPSY    . ROTATOR CUFF REPAIR Left 2000  . TONSILLECTOMY       Current Outpatient Medications  Medication Sig Dispense Refill  . ALPRAZolam (XANAX) 0.25 MG tablet TAKE 1 TABLET BY MOUTH AT BEDTIME AS NEEDED FOR ANXIETY 30 tablet 3  . Cholecalciferol (VITAMIN D3) 1000 UNITS CAPS Take 2 capsules by mouth every morning.     . diltiazem (CARTIA XT) 240 MG 24 hr capsule Take 240 mg by  mouth in the morning 90 capsule 3  . doxycycline (VIBRA-TABS) 100 MG tablet Take 1 tablet (100 mg total) by mouth 2 (two) times daily. (Patient not taking: Reported on 12/31/2018) 20 tablet 0  . flecainide (TAMBOCOR) 100 MG tablet TAKE 1 TABLET BY MOUTH TWICE A DAY 180 tablet 1  . losartan (COZAAR) 25 MG tablet Take 1 tablet (25 mg total) by mouth daily. 90 tablet 3  . metoprolol succinate (TOPROL-XL) 25 MG 24 hr tablet Take 1 tablet (25 mg total) by mouth daily. Take with or immediately following a meal. 90 tablet 3  . Multiple Vitamin (MULTIVITAMIN) tablet Take 1 tablet by mouth daily.    . potassium citrate (UROCIT-K) 10 MEQ (1080 MG) SR tablet Take 10 mEq by mouth 3 (three) times daily.  11  . rosuvastatin (CRESTOR) 10 MG tablet TAKE 1 TABLET EVERY DAY 90 tablet 3  . sertraline (ZOLOFT) 50 MG tablet TAKE 1 AND 1/2 TABLETS BY MOUTH DAILY 135 tablet 0  . Turmeric 500 MG CAPS Take 500 mg by mouth daily.    Francis Campbell warfarin (JANTOVEN) 5 MG tablet TAKE 1-2 TABLETS DAILY AS  DIRECTED BY COUMADIN CLINIC 120 tablet 3   No current facility-administered medications for this visit.     Allergies:   Chocolate, Chocolate flavor, Dextroamphetamine, Dextromethorphan, Flomax [tamsulosin hcl], Floxin [ofloxacin], Guaifenesin, Lisinopril, Tavist allergy [clemastine fumarate], Tamsulosin, Amiodarone, and Robitussin cf [pseudoephedrine-dm-gg]    Social History:  The patient  reports that he has never smoked. He has never used smokeless tobacco. He reports that he does not drink alcohol or use drugs.   Family History:  The patient's family history includes Appendicitis in his son; Arthritis in his paternal grandmother; Heart disease in his father and paternal grandmother; Hypertension in his mother; Kidney cancer in his son; Kidney disease in his mother; Leukemia in his father; Nephrolithiasis in his brother and father; Stroke in his mother.    ROS:  Please see the history of present illness.   Otherwise, review of  systems are positive for none.   All other systems are reviewed and negative.    PHYSICAL EXAM: VS:  BP 138/70   Pulse 60   Ht 6' 1"  (1.854 m)   Wt 184 lb (83.5 kg)   BMI 24.28 kg/m  , BMI Body mass index is 24.28 kg/m. GENERAL:  Well appearing HEENT: Pupils equal round and reactive, fundi not visualized, oral mucosa unremarkable NECK:  No jugular venous distention, waveform within normal limits, carotid upstroke brisk and symmetric, no bruits, no thyromegaly LYMPHATICS:  No cervical adenopathy LUNGS:  Clear to auscultation bilaterally HEART:  RRR.  PMI not displaced or sustained,S1 and S2 within  normal limits, no S3, no S4, no clicks, no rubs, II/VI systolic murmur at the LUSB. ABD:  Flat, positive bowel sounds normal in frequency in pitch, no bruits, no rebound, no guarding, no midline pulsatile mass, no hepatomegaly, no splenomegaly EXT:  2 plus pulses throughout, no edema, no cyanosis no clubbing SKIN:  No rashes no nodules NEURO:  Cranial nerves II through XII grossly intact, motor grossly intact throughout PSYCH:  Cognitively intact, oriented to person place and time   EKG:  EKG is ordered today. The ekg ordered 06/26/16 demonstrates sinus bradycardia rate 52.  06/04/17: Sinus bradycardia.  Rate 48 bpm. 08/27/18: Sinus rhythm.  Rate 62 bpm.   07/14/19: Sinus rhythm.  Rate 60 bpm.   Echo 01/03/18: Study Conclusions  - Left ventricle: The cavity size was normal. Wall thickness was   increased in a pattern of mild LVH. Systolic function was normal.   The estimated ejection fraction was in the range of 60% to 65%.   Wall motion was normal; there were no regional wall motion   abnormalities. Left ventricular diastolic function parameters   were normal. - Aortic valve: Dilated aortic sinus. There was mild regurgitation. - Atrial septum: No defect or patent foramen ovale was identified. - Pulmonary arteries: PA peak pressure: 31 mm Hg (S).  Exercise Myoview 09/25/17:   The  patient walked for a total of 10 minutes and 15 seconds under standard Bruce protocol treadmill test. The peak heart rate is 142 which is 96% predicted maximal heart rate.  There were no ST or T wave changes to suggest ischemia.  Nuclear stress EF: 61%. The left ventricular ejection fraction is normal (55-65%).  This is a low risk study. The study is normal.  Recent Labs: 08/27/2018: ALT 22; BUN 24; Creatinine, Ser 0.90; Magnesium 2.3; Potassium 5.0; Sodium 144    Lipid Panel    Component Value Date/Time   CHOL 166 08/27/2018 1125   TRIG 167 (H) 08/27/2018 1125   HDL 38 (L) 08/27/2018 1125   CHOLHDL 4.4 08/27/2018 1125   CHOLHDL 5 10/07/2017 1510   VLDL 58.4 (H) 10/07/2017 1510   LDLCALC 95 08/27/2018 1125   LDLDIRECT 96.0 10/07/2017 1510      Wt Readings from Last 3 Encounters:  07/14/19 184 lb (83.5 kg)  12/31/18 195 lb (88.5 kg)  12/08/18 196 lb (88.9 kg)     ASSESSMENT AND PLAN:  # Paroxysmal atrial fibrillation:  # Bradycardia: Mr. Furches had one episode of atrial fibrillation but is otherwise doing well.  Continue flecainide, metoprolol, and diltiazem.  Continue warfarin.  He can take an extra flecainide as needed.  His INR will be checked today.  He will return for a CMP and magnesium.  This patients CHA2DS2-VASc Score and unadjusted Ischemic Stroke Rate (% per year) is equal to 2.2 % stroke rate/year from a score of 2  Above score calculated as 1 point each if present [CHF, HTN, DM, Vascular=MI/PAD/Aortic Plaque, Age if 65-74, or Male] Above score calculated as 2 points each if present [Age > 75, or Stroke/TIA/TE]  # Atypical chest pain: Resolved. Exercise Myoview was negative for ischemia.    # Hypertension: BP was mildly elevated.  It has been a little bit high at home.  The goal is less than 130/80.  He will work on increasing his exercise to at least 150 minutes weekly.  Continue diltiazem, losartan and metoprolol.  # Hyperlipidemia:  Continue rosuvastatin.   He will have lipids and a CMP checked  at the time of his next INR.  # Aortic valve sclerosis: Unchanged on echo 12/2017.    Current medicines are reviewed at length with the patient today.  The patient does not have concerns regarding medicines.  The following changes have been made:  none  Labs/ tests ordered today include:   Orders Placed This Encounter  Procedures  . Lipid panel  . Comprehensive metabolic panel  . Magnesium  . EKG 12-Lead     Disposition:   FU with Jaclyn Andy C. Oval Linsey, MD, [facility] in 4 months      Signed, Bladyn Tipps C. Oval Linsey, MD, [facility]  07/14/2019 9:49 AM    [facility]

## 2019-07-14 NOTE — Patient Instructions (Signed)
Medication Instructions:  Your physician recommends that you continue on your current medications as directed. Please refer to the Current Medication list given to you today.  *If you need a refill on your cardiac medications before your next appointment, please call your pharmacy*  Lab Work: FASTING LP/CMET/MAGNESIUM [place] NEXT INR  If you have labs (blood work) drawn today and your tests are completely normal, you will receive your results only by: [name] MyChart Message (if you have MyChart) OR . A paper copy in the mail If you have any lab test that is abnormal or we need to change your treatment, we will call you to review the results.  Testing/Procedures: NONE  Follow-Up: At [facility], you and your health needs are our priority.  As part of our continuing mission to provide you with exceptional heart care, we have created designated Provider Care Teams.  These Care Teams include your primary Cardiologist (physician) and Advanced Practice Providers (APPs -  Physician Assistants and Nurse Practitioners) who all work together to provide you with the care you need, when you need it.  Your next appointment:   4 months  The format for your next appointment:   In Person  Provider:   You may see Skeet Latch, MD or one of the following Advanced Practice Providers on your designated Care Team:    Kerin Ransom, PA-C  [place], [place]  Coletta Memos, [place]   Other Instructions WORK ON EXERCISING [place]

## 2019-07-20 ENCOUNTER — Other Ambulatory Visit: Payer: Self-pay

## 2019-07-20 DIAGNOSIS — Z20828 Contact with and (suspected) exposure to other viral communicable diseases: Secondary | ICD-10-CM | POA: Diagnosis not present

## 2019-07-20 DIAGNOSIS — Z20822 Contact with and (suspected) exposure to covid-19: Secondary | ICD-10-CM

## 2019-07-22 LAB — NOVEL CORONAVIRUS, NAA: SARS-CoV-2, NAA: NOT DETECTED

## 2019-08-12 ENCOUNTER — Other Ambulatory Visit: Payer: Self-pay

## 2019-08-16 ENCOUNTER — Other Ambulatory Visit: Payer: Self-pay | Admitting: Family Medicine

## 2019-08-18 ENCOUNTER — Encounter: Payer: Self-pay | Admitting: Gastroenterology

## 2019-08-18 ENCOUNTER — Ambulatory Visit (INDEPENDENT_AMBULATORY_CARE_PROVIDER_SITE_OTHER): Payer: Medicare Other | Admitting: Gastroenterology

## 2019-08-18 VITALS — BP 132/70 | HR 58 | Temp 97.4°F | Ht 73.0 in | Wt 187.0 lb

## 2019-08-18 DIAGNOSIS — K5900 Constipation, unspecified: Secondary | ICD-10-CM

## 2019-08-18 NOTE — Progress Notes (Signed)
Review of pertinent gastrointestinal problems: 1.  Heartburn, dyspepsia led to EGD March 2020 which showed mild gastritis.  Biopsies showed no sign of H. Pylori and he was recommended to continue once daily antiacid medicine, avoid caffeinated and carbonated beverages. 2.  Routine risk for colon cancer: Colonoscopy February 2013 Dr. Verl Blalock routine risk screening was normal.  He recommended repeat colonoscopy for cancer screening in 10 years  HPI: This is a very pleasant 73 year old man whom I last saw him at the time of an upper endoscopy March 2020.  He is here today for a different problem.  Over the past 6 months he has noticed a change in his bowel patterns.  Previously he would have 1 bowel movement a day or 2 bowel movements daily.  Starting about 6 months ago he noticed alternating pattern of constipation and diarrhea.  He will even have explosive diarrhea and this is almost always after several days of constipation.  He does not see any blood in his stool.  He has been very bloated and intermittently gassy around the same time as well.  He can point to [employer] fries, peanuts, beans and corn as being foods that will often also predispose him to diarrhea.  He does not drink caffeine very much at all and he does not drink alcohol.  He cut out late-night snacks earlier this year and since then he has lost about 10 pounds.   Chief complaint is alternating constipation, diarrhea    Review of systems: Pertinent positive and negative review of systems were noted in the above HPI section. All other review negative.   Past Medical History:  Diagnosis Date  . Allergy   . Aortic regurgitation   . Arthritis   . Asthma    only as child  . Atrial flutter ([place])    a. Dx 09/2012.  . Crohn disease ([place])    Remotely - in the 1990s  . Esophageal reflux    Remotely  . History of kidney stones   . Hypercholesteremia   . Hypertension   . Paroxysmal atrial fibrillation (HCC)    Takes PRN flecainide  . 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  [name] TIA (transient ischemic attack)    ~2005 - had visual symptoms, was told by [name] this may have been a TIA but it's unclear  . Valvular heart disease    Mild AI, ASclerosis without AS by echo 10/2011    Past Surgical History:  Procedure Laterality Date  . CARDIOVERSION N/A 05/08/2013   Procedure: CARDIOVERSION;  Surgeon: Darlin Coco, MD;  Location: [facility];  Service: Cardiovascular;  Laterality: N/A;  . CYSTOSCOPY/RETROGRADE/URETEROSCOPY/STONE EXTRACTION WITH BASKET Right 02/12/2013   Procedure: CYSTOSCOPY/RETROGRADE/URETEROSCOPY/STONE EXTRACTION WITH BASKET, WITH  STENT PLACEMENT RIGHT URETERAL DILATION;  Surgeon: [name], MD;  Location: [facility];  Service: Urology;  Laterality: Right;  . EYE SURGERY Bilateral 1950, 1960, 1964   left, right, left  . HERNIA REPAIR  2005  . KNEE SURGERY Bilateral 1990, 2000  . LITHOTRIPSY    . ROTATOR CUFF REPAIR Left 2000  . TONSILLECTOMY      Current Outpatient Medications  Medication Sig Dispense Refill  . ALPRAZolam (XANAX) 0.25 MG tablet TAKE 1 TABLET BY MOUTH AT BEDTIME AS NEEDED FOR ANXIETY 30 tablet 3  . Cholecalciferol (VITAMIN D3) 1000 UNITS CAPS Take 2 capsules by mouth every morning.     . diltiazem (CARTIA XT) 240 MG 24 hr capsule Take  240 mg by mouth in the morning 90 capsule 3  . doxycycline (VIBRA-TABS) 100 MG tablet Take 1 tablet (100 mg total) by mouth 2 (two) times daily. 20 tablet 0  . flecainide (TAMBOCOR) 100 MG tablet TAKE 1 TABLET BY MOUTH TWICE A DAY 180 tablet 1  . losartan (COZAAR) 25 MG tablet Take 1 tablet (25 mg total) by mouth daily. 90 tablet 3  . metoprolol succinate (TOPROL-XL) 25 MG 24 hr tablet Take 1 tablet (25 mg total) by mouth daily. Take with or immediately following a meal. 90 tablet 3  . Multiple Vitamin (MULTIVITAMIN) tablet Take 1 tablet by mouth daily.    . potassium citrate (UROCIT-K) 10 MEQ  (1080 MG) SR tablet Take 10 mEq by mouth 3 (three) times daily.  11  . rosuvastatin (CRESTOR) 10 MG tablet TAKE 1 TABLET EVERY DAY 90 tablet 3  . sertraline (ZOLOFT) 50 MG tablet TAKE 1 AND 1/2 TABLETS BY MOUTH DAILY 135 tablet 0  . Turmeric 500 MG CAPS Take 500 mg by mouth daily.    [name] warfarin (JANTOVEN) 5 MG tablet TAKE 1-2 TABLETS DAILY AS  DIRECTED BY COUMADIN CLINIC 120 tablet 3   No current facility-administered medications for this visit.     Allergies as of 08/18/2019 - Review Complete 08/18/2019  Allergen Reaction Noted  . Chocolate Palpitations 09/15/2012  . Chocolate flavor Shortness Of Breath 08/29/2018  . Dextroamphetamine Shortness Of Breath 08/29/2018  . Dextromethorphan Palpitations 10/03/2012  . Flomax [tamsulosin hcl] Other (See Comments) 10/17/2012  . Floxin [ofloxacin] Other (See Comments) 08/08/2011  . Guaifenesin Shortness Of Breath 08/29/2018  . Lisinopril Cough 12/07/2010  . Tavist allergy [clemastine fumarate] Other (See Comments) 12/07/2010  . Tamsulosin Other (See Comments) 08/01/2018  . Amiodarone Other (See Comments) 12/07/2010  . Robitussin cf [pseudoephedrine-dm-gg] Palpitations 09/09/2011    Family History  Problem Relation Age of Onset  . Stroke Mother   . Hypertension Mother   . Kidney disease Mother        stones  . Nephrolithiasis Brother   . Arthritis Paternal Grandmother   . Heart disease Paternal Grandmother   . Leukemia Father   . Heart disease Father   . Nephrolithiasis Father   . Kidney cancer Son   . Appendicitis Son   . Heart attack Neg Hx   . Colon cancer Neg Hx   . Esophageal cancer Neg Hx   . Pancreatic cancer Neg Hx   . Stomach cancer Neg Hx   . Liver disease Neg Hx     Social History   Socioeconomic History  . Marital status: Married    Spouse name: Not on file  . Number of children: 2  . Years of education: Not on file  . Highest education level: Not on file  Occupational History  . Occupation: TECHNICAL SUP     Employer: [employer]  Social Needs  . Financial resource strain: Not on file  . Food insecurity    Worry: Not on file    Inability: Not on file  . Transportation needs    Medical: Not on file    Non-medical: Not on file  Tobacco Use  . Smoking status: Never Smoker  . Smokeless tobacco: Never Used  Substance and Sexual Activity  . Alcohol use: No  . Drug use: No  . Sexual activity: Yes    Birth control/protection: None  Lifestyle  . Physical activity    Days per week: Not on file    Minutes per  session: Not on file  . Stress: Not on file  Relationships  . Social Herbalist on phone: Not on file    Gets together: Not on file    Attends religious service: Not on file    Active member of club or organization: Not on file    Attends meetings of clubs or organizations: Not on file    Relationship status: Not on file  . Intimate partner violence    Fear of current or ex partner: Not on file    Emotionally abused: Not on file    Physically abused: Not on file    Forced sexual activity: Not on file  Other Topics Concern  . Not on file  Social History Narrative   Non smoker  No ets            Physical Exam: BP 132/70 (BP Location: Left Arm, Patient Position: Sitting)   Pulse (!) 58   Temp (!) 97.4 F (36.3 C)   Ht 6' 1"  (1.854 m)   Wt 187 lb (84.8 kg)   SpO2 98%   BMI 24.67 kg/m  Constitutional: generally well-appearing Psychiatric: alert and oriented x3 Eyes: extraocular movements intact Mouth: oral pharynx moist, no lesions Neck: supple no lymphadenopathy Cardiovascular: heart regular rate and rhythm Lungs: clear to auscultation bilaterally Abdomen: soft, nontender, nondistended, no obvious ascites, no peritoneal signs, normal bowel sounds Extremities: no lower extremity edema bilaterally Skin: no lesions on visible extremities   Assessment and plan: 73 y.o. male with alternating constipation diarrhea  Pretty classic alternating constipation with  diarrhea bowel pattern over the past 6 months.  I recommended a trial of over-the-counter fiber supplements to see if we can break this cycle.  He will call to report on his response in about 4 weeks.  He knows that if this conservative measures not helpful then I might recommend further testing including colonoscopy since it had been almost 8 years since his last one.   Please see the "Patient Instructions" section for addition details about the plan.   Owens Loffler, MD [facility] 08/18/2019, 2:13 PM  Cc: [name], MD

## 2019-08-18 NOTE — Patient Instructions (Signed)
If you are age 72 or older, your body mass index should be between 23-30. Your Body mass index is 24.67 kg/m. If this is out of the aforementioned range listed, please consider follow up with your Primary Care Provider.  If you are age 19 or younger, your body mass index should be between 19-25. Your Body mass index is 24.67 kg/m. If this is out of the aformentioned range listed, please consider follow up with your Primary Care Provider.     Citrucel powder- start with 1 small teaspoon and slowing increase over a week to a heaping tablespoon.     Call with an update in 4 weeks.

## 2019-08-26 ENCOUNTER — Ambulatory Visit (INDEPENDENT_AMBULATORY_CARE_PROVIDER_SITE_OTHER): Payer: Medicare Other | Admitting: Pharmacist

## 2019-08-26 ENCOUNTER — Other Ambulatory Visit: Payer: Self-pay

## 2019-08-26 DIAGNOSIS — I480 Paroxysmal atrial fibrillation: Secondary | ICD-10-CM

## 2019-08-26 DIAGNOSIS — E785 Hyperlipidemia, unspecified: Secondary | ICD-10-CM | POA: Diagnosis not present

## 2019-08-26 DIAGNOSIS — Z5181 Encounter for therapeutic drug level monitoring: Secondary | ICD-10-CM

## 2019-08-26 DIAGNOSIS — I10 Essential (primary) hypertension: Secondary | ICD-10-CM | POA: Diagnosis not present

## 2019-08-26 LAB — COMPREHENSIVE METABOLIC PANEL
ALT: 28 IU/L (ref 0–44)
AST: 23 IU/L (ref 0–40)
Albumin/Globulin Ratio: 2.2 (ref 1.2–2.2)
Albumin: 4.6 g/dL (ref 3.7–4.7)
Alkaline Phosphatase: 87 IU/L (ref 39–117)
BUN/Creatinine Ratio: 21 (ref 10–24)
BUN: 18 mg/dL (ref 8–27)
Bilirubin Total: 0.5 mg/dL (ref 0.0–1.2)
CO2: 25 mmol/L (ref 20–29)
Calcium: 9.5 mg/dL (ref 8.6–10.2)
Chloride: 102 mmol/L (ref 96–106)
Creatinine, Ser: 0.87 mg/dL (ref 0.76–1.27)
GFR calc Af Amer: 100 mL/min/{1.73_m2} (ref >59)
GFR calc non Af Amer: 86 mL/min/{1.73_m2} (ref >59)
Globulin, Total: 2.1 g/dL (ref 1.5–4.5)
Glucose: 85 mg/dL (ref 65–99)
Potassium: 5.1 mmol/L (ref 3.5–5.2)
Sodium: 140 mmol/L (ref 134–144)
Total Protein: 6.7 g/dL (ref 6.0–8.5)

## 2019-08-26 LAB — LIPID PANEL
Chol/HDL Ratio: 4.3 ratio (ref 0.0–5.0)
Cholesterol, Total: 194 mg/dL (ref 100–199)
HDL: 45 mg/dL (ref >39)
LDL Chol Calc (NIH): 119 mg/dL — ABNORMAL HIGH (ref 0–99)
Triglycerides: 168 mg/dL — ABNORMAL HIGH (ref 0–149)
VLDL Cholesterol Cal: 30 mg/dL (ref 5–40)

## 2019-08-26 LAB — MAGNESIUM: Magnesium: 2.2 mg/dL (ref 1.6–2.3)

## 2019-08-26 LAB — POCT INR: INR: 2 (ref 2.0–3.0)

## 2019-09-03 ENCOUNTER — Telehealth: Payer: Self-pay | Admitting: *Deleted

## 2019-09-03 DIAGNOSIS — E785 Hyperlipidemia, unspecified: Secondary | ICD-10-CM

## 2019-09-03 DIAGNOSIS — Z5181 Encounter for therapeutic drug level monitoring: Secondary | ICD-10-CM

## 2019-09-03 DIAGNOSIS — I10 Essential (primary) hypertension: Secondary | ICD-10-CM

## 2019-09-03 MED ORDER — ROSUVASTATIN CALCIUM 20 MG PO TABS: 20.0000 mg | ORAL_TABLET | Freq: Every day | ORAL | 3 refills | Status: DC

## 2019-09-03 NOTE — Telephone Encounter (Signed)
-----   Message from Skeet Latch, MD sent at 09/02/2019  3:41 PM EST ----- Cholesterol is pretty stable from last year.  I reviewed the CT scan he had in 2019 and there was some plaque on the aorta.  This means the LDL should be <70.  Lets increase rosuvastatin to 5m.  Repeat lipids/CMP in 3 months.

## 2019-09-03 NOTE — Telephone Encounter (Signed)
Advised patient of lab results, Rx sent to pharmacy, and mailed to lab orders to patient

## 2019-09-20 ENCOUNTER — Encounter: Payer: Self-pay | Admitting: Family Medicine

## 2019-09-23 ENCOUNTER — Other Ambulatory Visit: Payer: Self-pay

## 2019-09-23 ENCOUNTER — Ambulatory Visit (INDEPENDENT_AMBULATORY_CARE_PROVIDER_SITE_OTHER): Payer: Medicare Other | Admitting: Otolaryngology

## 2019-09-23 VITALS — Temp 97.2°F

## 2019-09-23 DIAGNOSIS — H6123 Impacted cerumen, bilateral: Secondary | ICD-10-CM

## 2019-09-23 NOTE — Progress Notes (Signed)
HPI: Francis Campbell is a 74 y.o. male who presents for evaluation of ear wax.  He had previously followed with Dr. Ernesto Rutherford on a regular basis to have his ears cleaned.  He has small ear canals bilaterally.  He has had previous ear canal infections.  However he is having no pain today..  Past Medical History:  Diagnosis Date  . Allergy   . Aortic regurgitation   . Arthritis   . Asthma    only as child  . Atrial flutter ([place])    a. Dx 09/2012.  . Crohn disease ([place])    Remotely - in the 1990s  . Esophageal reflux    Remotely  . History of kidney stones   . Hypercholesteremia   . Hypertension   . Paroxysmal atrial fibrillation (HCC)    Takes PRN flecainide  . 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  [name] TIA (transient ischemic attack)    ~2005 - had visual symptoms, was told by [name] this may have been a TIA but it's unclear  . Valvular heart disease    Mild AI, ASclerosis without AS by echo 10/2011   Past Surgical History:  Procedure Laterality Date  . CARDIOVERSION N/A 05/08/2013   Procedure: CARDIOVERSION;  Surgeon: Darlin Coco, MD;  Location: [facility] ENDOSCOPY;  Service: Cardiovascular;  Laterality: N/A;  . CYSTOSCOPY/RETROGRADE/URETEROSCOPY/STONE EXTRACTION WITH BASKET Right 02/12/2013   Procedure: CYSTOSCOPY/RETROGRADE/URETEROSCOPY/STONE EXTRACTION WITH BASKET, WITH  STENT PLACEMENT RIGHT URETERAL DILATION;  Surgeon: [name], MD;  Location: [facility] ORS;  Service: Urology;  Laterality: Right;  . EYE SURGERY Bilateral 1950, 1960, 1964   left, right, left  . HERNIA REPAIR  2005  . KNEE SURGERY Bilateral 1990, 2000  . LITHOTRIPSY    . ROTATOR CUFF REPAIR Left 2000  . TONSILLECTOMY     Social History   Socioeconomic History  . Marital status: Married    Spouse name: Not on file  . Number of children: 2  . Years of education: Not on file  . Highest education level: Not on file  Occupational History  . Occupation: TECHNICAL SUP   Employer: [employer]  Tobacco Use  . Smoking status: Never Smoker  . Smokeless tobacco: Never Used  Substance and Sexual Activity  . Alcohol use: No  . Drug use: No  . Sexual activity: Yes    Birth control/protection: None  Other Topics Concern  . Not on file  Social History Narrative   Non smoker  No ets          Social Determinants of Health   Financial Resource Strain:   . Difficulty of Paying Living Expenses: Not on file  Food Insecurity:   . Worried About Charity fundraiser in the Last Year: Not on file  . Ran Out of Food in the Last Year: Not on file  Transportation Needs:   . Lack of Transportation (Medical): Not on file  . Lack of Transportation (Non-Medical): Not on file  Physical Activity:   . Days of Exercise per Week: Not on file  . Minutes of Exercise per Session: Not on file  Stress:   . Feeling of Stress : Not on file  Social Connections:   . Frequency of Communication with Friends and Family: Not on file  . Frequency of Social Gatherings with Friends and Family: Not on file  . Attends Religious Services: Not on file  . Active Member of Clubs or Organizations: Not on  file  . Attends Archivist Meetings: Not on file  . Marital Status: Not on file   Family History  Problem Relation Age of Onset  . Stroke Mother   . Hypertension Mother   . Kidney disease Mother        stones  . Nephrolithiasis Brother   . Arthritis Paternal Grandmother   . Heart disease Paternal Grandmother   . Leukemia Father   . Heart disease Father   . Nephrolithiasis Father   . Kidney cancer Son   . Appendicitis Son   . Heart attack Neg Hx   . Colon cancer Neg Hx   . Esophageal cancer Neg Hx   . Pancreatic cancer Neg Hx   . Stomach cancer Neg Hx   . Liver disease Neg Hx    Allergies  Allergen Reactions  . Chocolate Palpitations  . Chocolate Flavor Shortness Of Breath  . Dextroamphetamine Shortness Of Breath  . Dextromethorphan Palpitations  . Flomax [Tamsulosin  Hcl] Other (See Comments)    ? Causes A Fib  . Floxin [Ofloxacin] Other (See Comments)    hallucinations  . Guaifenesin Shortness Of Breath  . Lisinopril Cough  . Tavist Allergy [Clemastine Fumarate] Other (See Comments)    A fib worse  . Tamsulosin Other (See Comments)  . Amiodarone Other (See Comments)    Abn  thyroid  . Robitussin Cf [Pseudoephedrine-Dm-Gg] Palpitations   Prior to Admission medications   Medication Sig Start Date End Date Taking? Authorizing Provider  ALPRAZolam Duanne Moron) 0.25 MG tablet TAKE 1 TABLET BY MOUTH AT BEDTIME AS NEEDED FOR ANXIETY 08/17/19   Burchette, Alinda Sierras, MD  Cholecalciferol (VITAMIN D3) 1000 UNITS CAPS Take 2 capsules by mouth every morning.     [provider]  diltiazem (CARTIA XT) 240 MG 24 hr capsule Take 240 mg by mouth in the morning 12/01/18   Skeet Latch, MD  doxycycline (VIBRA-TABS) 100 MG tablet Take 1 tablet (100 mg total) by mouth 2 (two) times daily. 12/29/18   Burchette, Alinda Sierras, MD  flecainide (TAMBOCOR) 100 MG tablet TAKE 1 TABLET BY MOUTH TWICE A DAY 06/25/19   [name], PA  losartan (COZAAR) 25 MG tablet Take 1 tablet (25 mg total) by mouth daily. 01/05/19   [name], PA  metoprolol succinate (TOPROL-XL) 25 MG 24 hr tablet Take 1 tablet (25 mg total) by mouth daily. Take with or immediately following a meal. 11/27/18   Skeet Latch, MD  Multiple Vitamin (MULTIVITAMIN) tablet Take 1 tablet by mouth daily.    [provider]  potassium citrate (UROCIT-K) 10 MEQ (1080 MG) SR tablet Take 10 mEq by mouth 3 (three) times daily. 11/17/15   [provider]  rosuvastatin (CRESTOR) 20 MG tablet Take 1 tablet (20 mg total) by mouth daily. TAKE 1 TABLET EVERY DAY 09/03/19   Skeet Latch, MD  sertraline (ZOLOFT) 50 MG tablet TAKE 1 AND 1/2 TABLETS BY MOUTH DAILY 05/08/19   Burchette, Alinda Sierras, MD  Turmeric 500 MG CAPS Take 500 mg by mouth daily.    [provider]  warfarin (JANTOVEN) 5 MG tablet TAKE 1-2  TABLETS DAILY AS  DIRECTED BY COUMADIN CLINIC 01/05/19   [name], PA  digoxin (LANOXIN) 0.125 MG tablet Take 1 tablet (125 mcg total) by mouth daily. 11/26/11 11/27/11  Darlin Coco, MD  losartan-hydrochlorothiazide [facility]) 100-12.5 MG per tablet  08/26/11 11/27/11  [provider]  pantoprazole (PROTONIX) 40 MG tablet Take 1 tablet (40 mg total) by  mouth daily. 10/29/11 11/27/11  Sable Feil, MD     Positive ROS: Otherwise negative  All other systems have been reviewed and were otherwise negative with the exception of those mentioned in the HPI and as above.  Physical Exam: Constitutional: Alert, well-appearing, no acute distress Ears: External ears without lesions or tenderness. Ear canals are small bilaterally with excessive amount of hairs and moderate wax buildup.  Several of the hairs were removed with forceps the wax was removed with suction and curettes.  The TMs were clear bilaterally. Nasal: External nose without lesions. Clear nasal passages Oral: Oropharynx clear. Neck: No palpable adenopathy or masses Respiratory: Breathing comfortably  Skin: No facial/neck lesions or rash noted.  Cerumen impaction removal  Date/Time: 09/23/2019 8:45 AM Performed by: Rozetta Nunnery, MD Authorized by: Rozetta Nunnery, MD   Consent:    Consent obtained:  Verbal   Consent given by:  Patient   Risks discussed:  Pain and bleeding Procedure details:    Location:  L ear and R ear   Procedure type: curette, suction and forceps   Post-procedure details:    Inspection:  TM intact and canal normal   Hearing quality:  Improved   Patient tolerance of procedure:  Tolerated well, no immediate complications Comments:     TMs clear bilaterally    Assessment: Bilateral cerumen impactions with excessive amount of hairs.  Plan: This was cleaned in the office. He would like to be followed every 6 months to have this cleaned.  He will follow-up earlier if he has any  problems.  Radene Journey, MD

## 2019-10-07 ENCOUNTER — Encounter: Payer: Self-pay | Admitting: Pharmacist

## 2019-10-07 ENCOUNTER — Other Ambulatory Visit: Payer: Self-pay

## 2019-10-07 ENCOUNTER — Ambulatory Visit (INDEPENDENT_AMBULATORY_CARE_PROVIDER_SITE_OTHER): Payer: Medicare Other | Admitting: Pharmacist

## 2019-10-07 DIAGNOSIS — I480 Paroxysmal atrial fibrillation: Secondary | ICD-10-CM

## 2019-10-07 DIAGNOSIS — Z5181 Encounter for therapeutic drug level monitoring: Secondary | ICD-10-CM | POA: Diagnosis not present

## 2019-10-07 LAB — POCT INR: INR: 2 (ref 2.0–3.0)

## 2019-10-09 ENCOUNTER — Encounter: Payer: Self-pay | Admitting: *Deleted

## 2019-10-14 ENCOUNTER — Ambulatory Visit: Payer: Medicare Other

## 2019-10-18 ENCOUNTER — Other Ambulatory Visit: Payer: Self-pay | Admitting: Physician Assistant

## 2019-10-20 DIAGNOSIS — R948 Abnormal results of function studies of other organs and systems: Secondary | ICD-10-CM | POA: Diagnosis not present

## 2019-10-22 ENCOUNTER — Ambulatory Visit: Payer: Medicare Other | Attending: Internal Medicine

## 2019-10-22 DIAGNOSIS — Z23 Encounter for immunization: Secondary | ICD-10-CM | POA: Insufficient documentation

## 2019-10-22 NOTE — Progress Notes (Signed)
   Covid-19 Vaccination Clinic  Name:  Francis Campbell    MRN: [id number] DOB: 10-23-1945  10/22/2019  [name] was observed post Covid-19 immunization for 15 minutes without incidence. He was provided with Vaccine Information Sheet and instruction to access the V-Safe system.   [name] was instructed to call 911 with any severe reactions post vaccine: [name] Difficulty breathing  . Swelling of your face and throat  . A fast heartbeat  . A bad rash all over your body  . Dizziness and weakness    Immunizations Administered    Name Date Dose VIS Date Route   Pfizer COVID-19 Vaccine 10/22/2019  5:25 PM 0.3 mL 08/28/2019 Intramuscular   Manufacturer: [place]   Lot: [id number]   [place]: [id number]

## 2019-10-26 DIAGNOSIS — R972 Elevated prostate specific antigen [PSA]: Secondary | ICD-10-CM | POA: Diagnosis not present

## 2019-10-26 DIAGNOSIS — N200 Calculus of kidney: Secondary | ICD-10-CM | POA: Diagnosis not present

## 2019-10-26 DIAGNOSIS — N5201 Erectile dysfunction due to arterial insufficiency: Secondary | ICD-10-CM | POA: Diagnosis not present

## 2019-11-02 ENCOUNTER — Other Ambulatory Visit: Payer: Self-pay | Admitting: Cardiovascular Disease

## 2019-11-02 ENCOUNTER — Other Ambulatory Visit: Payer: Self-pay | Admitting: Physician Assistant

## 2019-11-03 ENCOUNTER — Other Ambulatory Visit: Payer: Self-pay

## 2019-11-03 MED ORDER — SERTRALINE HCL 50 MG PO TABS: 75.0000 mg | ORAL_TABLET | Freq: Every day | ORAL | 0 refills | Status: DC

## 2019-11-04 ENCOUNTER — Ambulatory Visit: Payer: Medicare Other

## 2019-11-12 ENCOUNTER — Ambulatory Visit (INDEPENDENT_AMBULATORY_CARE_PROVIDER_SITE_OTHER): Payer: Medicare Other | Admitting: Cardiovascular Disease

## 2019-11-12 ENCOUNTER — Encounter: Payer: Self-pay | Admitting: Cardiovascular Disease

## 2019-11-12 ENCOUNTER — Other Ambulatory Visit: Payer: Self-pay

## 2019-11-12 ENCOUNTER — Ambulatory Visit (INDEPENDENT_AMBULATORY_CARE_PROVIDER_SITE_OTHER): Payer: Medicare Other | Admitting: Pharmacist

## 2019-11-12 VITALS — BP 134/72 | HR 57 | Ht 73.0 in | Wt 190.0 lb

## 2019-11-12 DIAGNOSIS — Z5181 Encounter for therapeutic drug level monitoring: Secondary | ICD-10-CM

## 2019-11-12 DIAGNOSIS — Z7901 Long term (current) use of anticoagulants: Secondary | ICD-10-CM

## 2019-11-12 DIAGNOSIS — I119 Hypertensive heart disease without heart failure: Secondary | ICD-10-CM

## 2019-11-12 DIAGNOSIS — I480 Paroxysmal atrial fibrillation: Secondary | ICD-10-CM

## 2019-11-12 DIAGNOSIS — E785 Hyperlipidemia, unspecified: Secondary | ICD-10-CM | POA: Diagnosis not present

## 2019-11-12 DIAGNOSIS — I351 Nonrheumatic aortic (valve) insufficiency: Secondary | ICD-10-CM

## 2019-11-12 LAB — COMPREHENSIVE METABOLIC PANEL
ALT: 33 IU/L (ref 0–44)
AST: 26 IU/L (ref 0–40)
Albumin/Globulin Ratio: 2 (ref 1.2–2.2)
Albumin: 4.5 g/dL (ref 3.7–4.7)
Alkaline Phosphatase: 83 IU/L (ref 39–117)
BUN/Creatinine Ratio: 24 (ref 10–24)
BUN: 24 mg/dL (ref 8–27)
Bilirubin Total: 0.6 mg/dL (ref 0.0–1.2)
CO2: 27 mmol/L (ref 20–29)
Calcium: 9.6 mg/dL (ref 8.6–10.2)
Chloride: 102 mmol/L (ref 96–106)
Creatinine, Ser: 0.98 mg/dL (ref 0.76–1.27)
GFR calc Af Amer: 89 mL/min/{1.73_m2} (ref >59)
GFR calc non Af Amer: 77 mL/min/{1.73_m2} (ref >59)
Globulin, Total: 2.2 g/dL (ref 1.5–4.5)
Glucose: 96 mg/dL (ref 65–99)
Potassium: 4.6 mmol/L (ref 3.5–5.2)
Sodium: 144 mmol/L (ref 134–144)
Total Protein: 6.7 g/dL (ref 6.0–8.5)

## 2019-11-12 LAB — PROTIME-INR
INR: 1.7 — ABNORMAL HIGH (ref 0.9–1.2)
Prothrombin Time: 17.4 s — ABNORMAL HIGH (ref 9.1–12.0)

## 2019-11-12 LAB — LIPID PANEL
Chol/HDL Ratio: 3.8 ratio (ref 0.0–5.0)
Cholesterol, Total: 165 mg/dL (ref 100–199)
HDL: 43 mg/dL (ref >39)
LDL Chol Calc (NIH): 92 mg/dL (ref 0–99)
Triglycerides: 171 mg/dL — ABNORMAL HIGH (ref 0–149)
VLDL Cholesterol Cal: 30 mg/dL (ref 5–40)

## 2019-11-12 MED ORDER — ROSUVASTATIN CALCIUM 20 MG PO TABS: 20.0000 mg | ORAL_TABLET | Freq: Every day | ORAL | 3 refills | Status: DC

## 2019-11-12 MED ORDER — METOPROLOL SUCCINATE ER 25 MG PO TB24: 25.0000 mg | ORAL_TABLET | Freq: Every day | ORAL | 3 refills | Status: DC

## 2019-11-12 MED ORDER — FLECAINIDE ACETATE 100 MG PO TABS: 100.0000 mg | ORAL_TABLET | Freq: Two times a day (BID) | ORAL | 2 refills | Status: DC

## 2019-11-12 NOTE — Progress Notes (Signed)
Cardiology Office Note   Date:  11/12/2019   ID:  Francis Campbell, Francis Campbell 08/08/1946, MRN [id number]  PCP:  Francis Post, MD  Cardiologist:   Francis Latch, MD  Cardiology APP: Francis Deforest, PA  No chief complaint on file.    History of Present Illness: Francis Campbell is a 74 y.o. male with paroxysmal atrial fibrillation on coumadin, hypertension, hyperlipidemia, and Crohn's disease who presents for follow up.  Francis Campbell was previously a patient of Francis Campbell.   Francis Campbell was evaluated by Dr. Rayann Campbell for catheter ablation in 2014 and elected to use flecainide as a "pill in the pocket."  He underwent DCCV 04/2013.  He had a negative Myoview 09/2011 and an echo 10/2011 that revealed LVEF 60% with mild AR.  He noted increasing episodes of atrial fibrillation and started on daily flecainide 04/2016.  He developed bradycardia, Campbell metoprolol was reduced.     Francis Campbell was seen in the ED 08/2017 with intermittent chest tightness.  His symptoms were felt to be atypical and EKG was unremarkable.  Troponin and chest x-ray are within normal limits.  He followed up in clinic and was referred for an exercise Myoview 09/2017 that was negative for ischemia.  He saw Francis Campbell, [place], on 12/2017 after a one hour episode of atrial fibrillation.  He was instructed to take an extra dose of flecainide as needed. At his appointment 08/2018 he had an episode of recurrent atrial fibrillation.  Since that time he has had 1 more episode of A. fib presents he took an extra dose of flecainide twice and by the following day he was back in rhythm.  He has otherwise been well.  Rosuvastatin was increased based on his last lab work.  He is tolerating this well.  He is not getting much exercise.  He has no chest pain or shortness of breath with exertion.  Blood pressure has been running around 135/75 at home.  He denies any lower extremity edema, orthopnea, or PND.  He has been trying to walk a little and plans to start riding  his bike soon.  He had his first Covid vaccine.  Past Medical History:  Diagnosis Date  . Allergy   . Aortic regurgitation   . Arthritis   . Asthma    only as child  . Atrial flutter ([place])    a. Dx 09/2012.  . Crohn disease ([place])    Remotely - in the 1990s  . Esophageal reflux    Remotely  . History of kidney stones   . Hypercholesteremia   . Hypertension   . Paroxysmal atrial fibrillation (HCC)    Takes PRN flecainide  . 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  [name] TIA (transient ischemic attack)    ~2005 - had visual symptoms, was told by Francis Campbell this may have been a TIA but it's unclear  . Valvular heart disease    Mild AI, ASclerosis without AS by echo 10/2011    Past Surgical History:  Procedure Laterality Date  . CARDIOVERSION N/A 05/08/2013   Procedure: CARDIOVERSION;  Surgeon: Francis Coco, MD;  Location: [facility] ENDOSCOPY;  Service: Cardiovascular;  Laterality: N/A;  . CYSTOSCOPY/RETROGRADE/URETEROSCOPY/STONE EXTRACTION WITH BASKET Right 02/12/2013   Procedure: CYSTOSCOPY/RETROGRADE/URETEROSCOPY/STONE EXTRACTION WITH BASKET, WITH  STENT PLACEMENT RIGHT URETERAL DILATION;  Surgeon: Francis So, MD;  Location: [facility] ORS;  Service: Urology;  Laterality: Right;  . EYE SURGERY Bilateral 1950, 1960,  1964   left, right, left  . HERNIA REPAIR  2005  . KNEE SURGERY Bilateral 1990, 2000  . LITHOTRIPSY    . ROTATOR CUFF REPAIR Left 2000  . TONSILLECTOMY       Current Outpatient Medications  Medication Sig Dispense Refill  . ALPRAZolam (XANAX) 0.25 MG tablet TAKE 1 TABLET BY MOUTH AT BEDTIME AS NEEDED FOR ANXIETY 30 tablet 3  . Cholecalciferol (VITAMIN D3) 1000 UNITS CAPS Take 2 capsules by mouth every morning.     . diltiazem (CARTIA XT) 240 MG 24 hr capsule Take 240 mg by mouth in the morning 90 capsule 3  . doxycycline (VIBRA-TABS) 100 MG tablet Take 1 tablet (100 mg total) by mouth 2 (two) times daily. 20 tablet 0  . flecainide (TAMBOCOR) 100  MG tablet TAKE 1 TABLET BY MOUTH TWICE A DAY 180 tablet 2  . losartan (COZAAR) 25 MG tablet TAKE 1 TABLET EVERY DAY 90 tablet 1  . metoprolol succinate (TOPROL-XL) 25 MG 24 hr tablet Take 1 tablet (25 mg total) by mouth daily. Take with or immediately following a meal. 90 tablet 3  . Multiple Vitamin (MULTIVITAMIN) tablet Take 1 tablet by mouth daily.    . potassium citrate (UROCIT-K) 10 MEQ (1080 MG) SR tablet Take 10 mEq by mouth 3 (three) times daily.  11  . rosuvastatin (CRESTOR) 20 MG tablet Take 1 tablet (20 mg total) by mouth daily. TAKE 1 TABLET EVERY DAY 90 tablet 3  . sertraline (ZOLOFT) 50 MG tablet Take 1.5 tablets (75 mg total) by mouth daily. 135 tablet 0  . Turmeric 500 MG CAPS Take 500 mg by mouth daily.    [name] warfarin (COUMADIN) 5 MG tablet TAKE 1 TO 2 TABLETS DAILY AS  DIRECTED BY COUMADIN CLINIC 180 tablet 0   No current facility-administered medications for this visit.    Allergies:   Chocolate, Chocolate flavor, Dextroamphetamine, Dextromethorphan, Flomax [tamsulosin hcl], Floxin [ofloxacin], Guaifenesin, Lisinopril, Tavist allergy [clemastine fumarate], Tamsulosin, Amiodarone, and Robitussin cf [pseudoephedrine-dm-gg]    Social History:  The patient  reports that he has never smoked. He has never used smokeless tobacco. He reports that he does not drink alcohol or use drugs.   Family History:  The patient's family history includes Appendicitis in his son; Arthritis in his paternal grandmother; Heart disease in his father and paternal grandmother; Hypertension in his mother; Kidney cancer in his son; Kidney disease in his mother; Leukemia in his father; Nephrolithiasis in his brother and father; Stroke in his mother.    ROS:  Please see the history of present illness.   Otherwise, review of systems are positive for none.   All other systems are reviewed and negative.    PHYSICAL EXAM: VS:  BP 134/72   Pulse (!) 57   Ht 6' 1"  (1.854 m)   Wt 190 lb (86.2 kg)   SpO2 100%    BMI 25.07 kg/m  , BMI Body mass index is 25.07 kg/m. GENERAL:  Well appearing HEENT: Pupils equal round and reactive, fundi not visualized, oral mucosa unremarkable NECK:  No jugular venous distention, waveform within normal limits, carotid upstroke brisk and symmetric, no bruits, no thyromegaly LYMPHATICS:  No cervical adenopathy LUNGS:  Clear to auscultation bilaterally HEART:  RRR.  PMI not displaced or sustained,S1 and S2 within normal limits, no S3, no S4, no clicks, no rubs, II/VI systolic murmur at the LUSB. ABD:  Flat, positive bowel sounds normal in frequency in pitch, no bruits, no rebound, no  guarding, no midline pulsatile mass, no hepatomegaly, no splenomegaly EXT:  2 plus pulses throughout, no edema, no cyanosis no clubbing SKIN:  No rashes no nodules NEURO:  Cranial nerves II through XII grossly intact, motor grossly intact throughout PSYCH:  Cognitively intact, oriented to person place and time   EKG:  EKG is not ordered today. The ekg ordered 06/26/16 demonstrates sinus bradycardia rate 52.  06/04/17: Sinus bradycardia.  Rate 48 bpm. 08/27/18: Sinus rhythm.  Rate 62 bpm.   07/14/19: Sinus rhythm.  Rate 60 bpm.   Echo 01/03/18: Study Conclusions  - Left ventricle: The cavity size was normal. Wall thickness was   increased in a pattern of mild LVH. Systolic function was normal.   The estimated ejection fraction was in the range of 60% to 65%.   Wall motion was normal; there were no regional wall motion   abnormalities. Left ventricular diastolic function parameters   were normal. - Aortic valve: Dilated aortic sinus. There was mild regurgitation. - Atrial septum: No defect or patent foramen ovale was identified. - Pulmonary arteries: PA peak pressure: 31 mm Hg (S).  Exercise Myoview 09/25/17:   The patient walked for a total of 10 minutes and 15 seconds under standard Bruce protocol treadmill test. The peak heart rate is 142 which is 96% predicted maximal heart  rate.  There were no ST or T wave changes to suggest ischemia.  Nuclear stress EF: 61%. The left ventricular ejection fraction is normal (55-65%).  This is a low risk study. The study is normal.  Recent Labs: 08/26/2019: ALT 28; BUN 18; Creatinine, Ser 0.87; Magnesium 2.2; Potassium 5.1; Sodium 140    Lipid Panel    Component Value Date/Time   CHOL 194 08/26/2019 0936   TRIG 168 (H) 08/26/2019 0936   HDL 45 08/26/2019 0936   CHOLHDL 4.3 08/26/2019 0936   CHOLHDL 5 10/07/2017 1510   VLDL 58.4 (H) 10/07/2017 1510   LDLCALC 119 (H) 08/26/2019 0936   LDLDIRECT 96.0 10/07/2017 1510      Wt Readings from Last 3 Encounters:  11/12/19 190 lb (86.2 kg)  08/18/19 187 lb (84.8 kg)  07/14/19 184 lb (83.5 kg)     ASSESSMENT AND PLAN:  # Paroxysmal atrial fibrillation:  # Bradycardia: Francis Campbell had one episode of atrial fibrillation but is otherwise doing well.  Continue flecainide, metoprolol, and diltiazem.  Continue warfarin.  He can take an extra flecainide as needed.  His INR will be checked today.    This patients CHA2DS2-VASc Score and unadjusted Ischemic Stroke Rate (% per year) is equal to 2.2 % stroke rate/year from a score of 2  Above score calculated as 1 point each if present [CHF, HTN, DM, Vascular=MI/PAD/Aortic Plaque, Age if 65-74, or Male] Above score calculated as 2 points each if present [Age > 75, or Stroke/TIA/TE]  # Atypical chest pain: Resolved. Exercise Myoview was negative for ischemia.    # Hypertension: BP stable.  Continue metoprolol, losartan, and diltiazem.  # Hyperlipidemia:  Continue rosuvastatin.  Check lipids/CMP today.  # Aortic valve sclerosis: Unchanged on echo 12/2017.  No evidence of heart failure on exam.   Current medicines are reviewed at length with the patient today.  The patient does not have concerns regarding medicines.  The following changes have been made:  none  Labs/ tests ordered today include:   No orders of the defined  types were placed in this encounter.    Disposition:   FU with Edilberto Roosevelt C.  Oval Linsey, MD, [facility] in 6 months      Signed, Audi Conover C. Oval Linsey, MD, [facility]  11/12/2019 9:25 AM    [facility]

## 2019-11-12 NOTE — Patient Instructions (Addendum)
Medication Instructions:  Your physician recommends that you continue on your current medications as directed. Please refer to the Current Medication list given to you today.  *If you need a refill on your cardiac medications before your next appointment, please call your pharmacy*  Lab Work: LP/CMET/INR TODAY   Testing/Procedures: NONE   Follow-Up: At [facility], you and your health needs are our priority.  As part of our continuing mission to provide you with exceptional heart care, we have created designated Provider Care Teams.  These Care Teams include your primary Cardiologist (physician) and Advanced Practice Providers (APPs -  Physician Assistants and Nurse Practitioners) who all work together to provide you with the care you need, when you need it.  Your next appointment:   6 month(s)You will receive a reminder letter in the mail two months in advance. If you don't receive a letter, please call our office to schedule the follow-up appointment.  The format for your next appointment:   In Person  Provider:   You may see Skeet Latch, MD or one of the following Advanced Practice Providers on your designated Care Team:    Kerin Ransom, PA-C  [place], [place]  Coletta Memos, [place]

## 2019-11-17 ENCOUNTER — Ambulatory Visit: Payer: Medicare Other | Attending: Internal Medicine

## 2019-11-17 DIAGNOSIS — Z23 Encounter for immunization: Secondary | ICD-10-CM | POA: Insufficient documentation

## 2019-11-17 NOTE — Progress Notes (Signed)
   Covid-19 Vaccination Clinic  Name:  Francis Campbell    MRN: [id number] DOB: 1945-12-07  11/17/2019  Francis Campbell was observed post Covid-19 immunization for 15 minutes without incident. He was provided with Vaccine Information Sheet and instruction to access the V-Safe system.   Francis Campbell was instructed to call 911 with any severe reactions post vaccine: [name] Difficulty breathing  . Swelling of face and throat  . A fast heartbeat  . A bad rash all over body  . Dizziness and weakness   Immunizations Administered    Name Date Dose VIS Date Route   Pfizer COVID-19 Vaccine 11/17/2019  9:51 AM 0.3 mL 08/28/2019 Intramuscular   Manufacturer: [place]   Lot: [id number]   [place]: [id number]

## 2019-11-20 ENCOUNTER — Other Ambulatory Visit: Payer: Self-pay

## 2019-11-20 ENCOUNTER — Observation Stay (HOSPITAL_COMMUNITY)
Admission: EM | Admit: 2019-11-20 | Discharge: 2019-11-21 | Disposition: A | Discharge status: Home / Self Care | Payer: Medicare Other | Attending: Internal Medicine | Admitting: Internal Medicine

## 2019-11-20 ENCOUNTER — Emergency Department (HOSPITAL_COMMUNITY): Payer: Medicare Other

## 2019-11-20 ENCOUNTER — Encounter (HOSPITAL_COMMUNITY): Payer: Self-pay | Admitting: Emergency Medicine

## 2019-11-20 DIAGNOSIS — K5090 Crohn's disease, unspecified, without complications: Secondary | ICD-10-CM | POA: Diagnosis present

## 2019-11-20 DIAGNOSIS — K8020 Calculus of gallbladder without cholecystitis without obstruction: Secondary | ICD-10-CM | POA: Diagnosis not present

## 2019-11-20 DIAGNOSIS — R198 Other specified symptoms and signs involving the digestive system and abdomen: Secondary | ICD-10-CM

## 2019-11-20 DIAGNOSIS — Z8709 Personal history of other diseases of the respiratory system: Secondary | ICD-10-CM | POA: Insufficient documentation

## 2019-11-20 DIAGNOSIS — Z888 Allergy status to other drugs, medicaments and biological substances status: Secondary | ICD-10-CM | POA: Insufficient documentation

## 2019-11-20 DIAGNOSIS — R739 Hyperglycemia, unspecified: Secondary | ICD-10-CM | POA: Diagnosis not present

## 2019-11-20 DIAGNOSIS — I4892 Unspecified atrial flutter: Secondary | ICD-10-CM | POA: Diagnosis not present

## 2019-11-20 DIAGNOSIS — I700 Atherosclerosis of aorta: Secondary | ICD-10-CM | POA: Insufficient documentation

## 2019-11-20 DIAGNOSIS — Z7901 Long term (current) use of anticoagulants: Secondary | ICD-10-CM

## 2019-11-20 DIAGNOSIS — I119 Hypertensive heart disease without heart failure: Secondary | ICD-10-CM | POA: Diagnosis not present

## 2019-11-20 DIAGNOSIS — K5010 Crohn's disease of large intestine without complications: Secondary | ICD-10-CM

## 2019-11-20 DIAGNOSIS — D751 Secondary polycythemia: Secondary | ICD-10-CM | POA: Insufficient documentation

## 2019-11-20 DIAGNOSIS — Z0189 Encounter for other specified special examinations: Secondary | ICD-10-CM

## 2019-11-20 DIAGNOSIS — N281 Cyst of kidney, acquired: Secondary | ICD-10-CM | POA: Diagnosis not present

## 2019-11-20 DIAGNOSIS — Z87442 Personal history of urinary calculi: Secondary | ICD-10-CM | POA: Diagnosis not present

## 2019-11-20 DIAGNOSIS — F419 Anxiety disorder, unspecified: Secondary | ICD-10-CM | POA: Insufficient documentation

## 2019-11-20 DIAGNOSIS — Z881 Allergy status to other antibiotic agents status: Secondary | ICD-10-CM | POA: Insufficient documentation

## 2019-11-20 DIAGNOSIS — R109 Unspecified abdominal pain: Secondary | ICD-10-CM | POA: Diagnosis present

## 2019-11-20 DIAGNOSIS — E785 Hyperlipidemia, unspecified: Secondary | ICD-10-CM | POA: Diagnosis not present

## 2019-11-20 DIAGNOSIS — K5669 Other partial intestinal obstruction: Secondary | ICD-10-CM | POA: Diagnosis not present

## 2019-11-20 DIAGNOSIS — N200 Calculus of kidney: Secondary | ICD-10-CM | POA: Diagnosis not present

## 2019-11-20 DIAGNOSIS — Z03818 Encounter for observation for suspected exposure to other biological agents ruled out: Secondary | ICD-10-CM | POA: Diagnosis not present

## 2019-11-20 DIAGNOSIS — E7800 Pure hypercholesterolemia, unspecified: Secondary | ICD-10-CM | POA: Insufficient documentation

## 2019-11-20 DIAGNOSIS — I4891 Unspecified atrial fibrillation: Secondary | ICD-10-CM | POA: Diagnosis present

## 2019-11-20 DIAGNOSIS — R011 Cardiac murmur, unspecified: Secondary | ICD-10-CM | POA: Insufficient documentation

## 2019-11-20 DIAGNOSIS — Z79899 Other long term (current) drug therapy: Secondary | ICD-10-CM | POA: Insufficient documentation

## 2019-11-20 DIAGNOSIS — I480 Paroxysmal atrial fibrillation: Secondary | ICD-10-CM | POA: Insufficient documentation

## 2019-11-20 DIAGNOSIS — D72829 Elevated white blood cell count, unspecified: Secondary | ICD-10-CM | POA: Diagnosis present

## 2019-11-20 DIAGNOSIS — K56609 Unspecified intestinal obstruction, unspecified as to partial versus complete obstruction: Secondary | ICD-10-CM | POA: Diagnosis not present

## 2019-11-20 DIAGNOSIS — R1031 Right lower quadrant pain: Secondary | ICD-10-CM | POA: Diagnosis not present

## 2019-11-20 DIAGNOSIS — K219 Gastro-esophageal reflux disease without esophagitis: Secondary | ICD-10-CM | POA: Insufficient documentation

## 2019-11-20 DIAGNOSIS — Z4659 Encounter for fitting and adjustment of other gastrointestinal appliance and device: Secondary | ICD-10-CM | POA: Insufficient documentation

## 2019-11-20 DIAGNOSIS — M1990 Unspecified osteoarthritis, unspecified site: Secondary | ICD-10-CM | POA: Insufficient documentation

## 2019-11-20 DIAGNOSIS — Z8673 Personal history of transient ischemic attack (TIA), and cerebral infarction without residual deficits: Secondary | ICD-10-CM | POA: Insufficient documentation

## 2019-11-20 DIAGNOSIS — I351 Nonrheumatic aortic (valve) insufficiency: Secondary | ICD-10-CM | POA: Insufficient documentation

## 2019-11-20 DIAGNOSIS — Z20822 Contact with and (suspected) exposure to covid-19: Secondary | ICD-10-CM | POA: Insufficient documentation

## 2019-11-20 DIAGNOSIS — Z91018 Allergy to other foods: Secondary | ICD-10-CM | POA: Insufficient documentation

## 2019-11-20 DIAGNOSIS — R112 Nausea with vomiting, unspecified: Secondary | ICD-10-CM | POA: Diagnosis not present

## 2019-11-20 LAB — COMPREHENSIVE METABOLIC PANEL
ALT: 34 U/L (ref 0–44)
AST: 29 U/L (ref 15–41)
Albumin: 4.7 g/dL (ref 3.5–5.0)
Alkaline Phosphatase: 69 U/L (ref 38–126)
Anion gap: 14 (ref 5–15)
BUN: 27 mg/dL — ABNORMAL HIGH (ref 8–23)
CO2: 18 mmol/L — ABNORMAL LOW (ref 22–32)
Calcium: 9.9 mg/dL (ref 8.9–10.3)
Chloride: 104 mmol/L (ref 98–111)
Creatinine, Ser: 1.03 mg/dL (ref 0.61–1.24)
GFR calc Af Amer: >60 mL/min (ref >60)
GFR calc non Af Amer: >60 mL/min (ref >60)
Glucose, Bld: 192 mg/dL — ABNORMAL HIGH (ref 70–99)
Potassium: 3.8 mmol/L (ref 3.5–5.1)
Sodium: 136 mmol/L (ref 135–145)
Total Bilirubin: 1 mg/dL (ref 0.3–1.2)
Total Protein: 7.7 g/dL (ref 6.5–8.1)

## 2019-11-20 LAB — URINALYSIS, ROUTINE W REFLEX MICROSCOPIC
Bilirubin Urine: NEGATIVE
Glucose, UA: NEGATIVE mg/dL
Hgb urine dipstick: NEGATIVE
Ketones, ur: 5 mg/dL — AB
Leukocytes,Ua: NEGATIVE
Nitrite: NEGATIVE
Protein, ur: NEGATIVE mg/dL
Specific Gravity, Urine: >1.046 — ABNORMAL HIGH (ref 1.005–1.030)
pH: 5 (ref 5.0–8.0)

## 2019-11-20 LAB — PROTIME-INR
INR: 1.9 — ABNORMAL HIGH (ref 0.8–1.2)
Prothrombin Time: 22.1 seconds — ABNORMAL HIGH (ref 11.4–15.2)

## 2019-11-20 LAB — CBC
HCT: 55.7 % — ABNORMAL HIGH (ref 39.0–52.0)
Hemoglobin: 18.2 g/dL — ABNORMAL HIGH (ref 13.0–17.0)
MCH: 28.8 pg (ref 26.0–34.0)
MCHC: 32.7 g/dL (ref 30.0–36.0)
MCV: 88.1 fL (ref 80.0–100.0)
Platelets: 221 10*3/uL (ref 150–400)
RBC: 6.32 MIL/uL — ABNORMAL HIGH (ref 4.22–5.81)
RDW: 13.3 % (ref 11.5–15.5)
WBC: 18.5 10*3/uL — ABNORMAL HIGH (ref 4.0–10.5)
nRBC: 0 % (ref 0.0–0.2)

## 2019-11-20 LAB — SARS CORONAVIRUS 2 (TAT 6-24 HRS): SARS Coronavirus 2: NEGATIVE

## 2019-11-20 LAB — LIPASE, BLOOD: Lipase: 27 U/L (ref 11–51)

## 2019-11-20 MED ORDER — ENOXAPARIN SODIUM 80 MG/0.8ML ~~LOC~~ SOLN
80.0000 mg | Freq: Two times a day (BID) | SUBCUTANEOUS | Status: DC
  Administered 2019-11-20 – 2019-11-21 (×3): 80 mg via SUBCUTANEOUS
  Filled 2019-11-20 (×3): qty 0.8

## 2019-11-20 MED ORDER — HYDRALAZINE HCL 20 MG/ML IJ SOLN: 5.0000 mg | INTRAMUSCULAR | Status: DC | PRN

## 2019-11-20 MED ORDER — POTASSIUM CHLORIDE 2 MEQ/ML IV SOLN
INTRAVENOUS | Status: DC
  Filled 2019-11-20 (×5): qty 1000

## 2019-11-20 MED ORDER — MORPHINE SULFATE (PF) 2 MG/ML IV SOLN: 1.0000 mg | INTRAVENOUS | Status: DC | PRN

## 2019-11-20 MED ORDER — OXYCODONE-ACETAMINOPHEN 5-325 MG PO TABS
1.0000 | ORAL_TABLET | ORAL | Status: DC | PRN
  Administered 2019-11-20: 1 via ORAL
  Filled 2019-11-20: qty 1

## 2019-11-20 MED ORDER — HYDROMORPHONE HCL 1 MG/ML IJ SOLN
1.0000 mg | Freq: Once | INTRAMUSCULAR | Status: AC
  Administered 2019-11-20: 1 mg via INTRAVENOUS
  Filled 2019-11-20: qty 1

## 2019-11-20 MED ORDER — ONDANSETRON 4 MG PO TBDP
4.0000 mg | ORAL_TABLET | Freq: Once | ORAL | Status: AC | PRN
  Administered 2019-11-20: 4 mg via ORAL
  Filled 2019-11-20: qty 1

## 2019-11-20 MED ORDER — DIATRIZOATE MEGLUMINE & SODIUM 66-10 % PO SOLN
90.0000 mL | Freq: Once | ORAL | Status: AC
  Administered 2019-11-20: 90 mL via NASOGASTRIC
  Filled 2019-11-20: qty 90

## 2019-11-20 MED ORDER — SODIUM CHLORIDE 0.9% FLUSH: 3.0000 mL | Freq: Once | INTRAVENOUS | Status: DC

## 2019-11-20 MED ORDER — SODIUM CHLORIDE 0.9 % IV BOLUS
1000.0000 mL | Freq: Once | INTRAVENOUS | Status: AC
  Administered 2019-11-20: 1000 mL via INTRAVENOUS

## 2019-11-20 MED ORDER — METOPROLOL TARTRATE 5 MG/5ML IV SOLN
5.0000 mg | Freq: Four times a day (QID) | INTRAVENOUS | Status: DC
  Administered 2019-11-20 – 2019-11-21 (×5): 5 mg via INTRAVENOUS
  Filled 2019-11-20 (×6): qty 5

## 2019-11-20 MED ORDER — SODIUM CHLORIDE (PF) 0.9 % IJ SOLN
INTRAMUSCULAR | Status: AC
  Filled 2019-11-20: qty 50

## 2019-11-20 MED ORDER — IOHEXOL 300 MG/ML  SOLN
100.0000 mL | Freq: Once | INTRAMUSCULAR | Status: AC | PRN
  Administered 2019-11-20: 100 mL via INTRAVENOUS

## 2019-11-20 NOTE — ED Notes (Signed)
Requested patient to urinate and gave a urinal

## 2019-11-20 NOTE — ED Provider Notes (Signed)
[facility] DEPT Provider Note  CSN: [id number] Arrival date & time: 11/20/19 0502  Chief Complaint(s) Abdominal Pain  HPI Francis Campbell is a 74 y.o. male with a past medical history listed below who presents to the emergency department with lower abdominal pain that began yesterday afternoon around 4 PM.  Patient reports that the pain was a mild vague pain in the periumbilical region which migrated to the right lower quadrant.  Since then pain has gradually worsened.  Pain worse with movement and palpation.  Had some associated nausea without emesis.  No diarrhea.  No urinary symptoms.  Patient reports no bowel movements or passing gas in 24 hours.  Does report a prior history of hernia status post mesh placement.  Patient also has a remote history of Crohn's disease.  No fevers or chills.  Denies any other physical complaints.  HPI  Past Medical History Past Medical History:  Diagnosis Date  . Allergy   . Aortic regurgitation   . Arthritis   . Asthma    only as child  . Atrial flutter ([place])    a. Dx 09/2012.  . Crohn disease ([place])    Remotely - in the 1990s  . Esophageal reflux    Remotely  . History of kidney stones   . Hypercholesteremia   . Hypertension   . Paroxysmal atrial fibrillation (HCC)    Takes PRN flecainide  . 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  [name] TIA (transient ischemic attack)    ~2005 - had visual symptoms, was told by [name] this may have been a TIA but it's unclear  . Valvular heart disease    Mild AI, ASclerosis without AS by echo 10/2011   Patient Active Problem List   Diagnosis Date Noted  . History of kidney stones 04/02/2018  . Chronic insomnia 04/07/2017  . Epigastric discomfort 11/23/2014  . LLQ discomfort 11/23/2014  . Encounter for therapeutic drug monitoring 10/22/2013  . BPH associated with nocturia 08/06/2013  . Atrial flutter ([place]) 11/17/2012  . Anxiety 10/03/2012  .  Benign hypertensive heart disease without heart failure 11/28/2011  . Regional enteritis of unspecified site 11/07/2011  . Special screening for malignant neoplasms, colon 11/07/2011  . Duodenitis 11/07/2011  . Major depression 11/06/2011  . Abdominal pain 10/22/2011  . Crohn disease ([place]) 10/22/2011  . Anticoagulated on Coumadin 10/22/2011  . AF (atrial fibrillation) ([place]) 10/22/2011  . Chest pain radiating to arm 09/14/2011  . Leucocytosis 09/09/2011  . Cough, persistent 07/30/2011  . Cough 07/30/2011  . Paroxysmal atrial fibrillation (HCC)   . Hypercholesteremia   . Aortic insufficiency   . Systolic murmur    Home Medication(s) Prior to Admission medications   Medication Sig Start Date End Date Taking? Authorizing Provider  ALPRAZolam Duanne Moron) 0.25 MG tablet TAKE 1 TABLET BY MOUTH AT BEDTIME AS NEEDED FOR ANXIETY 08/17/19   Burchette, Alinda Sierras, MD  Cholecalciferol (VITAMIN D3) 1000 UNITS CAPS Take 2 capsules by mouth every morning.     [provider]  diltiazem (CARTIA XT) 240 MG 24 hr capsule Take 240 mg by mouth in the morning 12/01/18   Skeet Latch, MD  flecainide (TAMBOCOR) 100 MG tablet Take 1 tablet (100 mg total) by mouth 2 (two) times daily. 11/12/19   Skeet Latch, MD  losartan (COZAAR) 25 MG tablet TAKE 1 TABLET EVERY DAY 11/02/19   Skeet Latch, MD  metoprolol succinate (TOPROL-XL) 25 MG 24 hr tablet  Take 1 tablet (25 mg total) by mouth daily. Take with or immediately following a meal. 11/12/19   Skeet Latch, MD  Multiple Vitamin (MULTIVITAMIN) tablet Take 1 tablet by mouth daily.    [provider]  potassium citrate (UROCIT-K) 10 MEQ (1080 MG) SR tablet Take 10 mEq by mouth 3 (three) times daily. 11/17/15   [provider]  rosuvastatin (CRESTOR) 20 MG tablet Take 1 tablet (20 mg total) by mouth daily. TAKE 1 TABLET EVERY DAY 11/12/19   Skeet Latch, MD  sertraline (ZOLOFT) 50 MG tablet Take 1.5 tablets (75 mg total) by mouth  daily. 11/03/19   Burchette, Alinda Sierras, MD  Turmeric 500 MG CAPS Take 500 mg by mouth daily.    [provider]  warfarin (COUMADIN) 5 MG tablet TAKE 1 TO 2 TABLETS DAILY AS  DIRECTED BY COUMADIN CLINIC 11/02/19   [name], PA  digoxin (LANOXIN) 0.125 MG tablet Take 1 tablet (125 mcg total) by mouth daily. 11/26/11 11/27/11  Darlin Coco, MD  losartan-hydrochlorothiazide [facility]) 100-12.5 MG per tablet  08/26/11 11/27/11  [provider]  pantoprazole (PROTONIX) 40 MG tablet Take 1 tablet (40 mg total) by mouth daily. 10/29/11 11/27/11  Sable Feil, MD                                                                                                                                    Past Surgical History Past Surgical History:  Procedure Laterality Date  . CARDIOVERSION N/A 05/08/2013   Procedure: CARDIOVERSION;  Surgeon: Darlin Coco, MD;  Location: [facility] ENDOSCOPY;  Service: Cardiovascular;  Laterality: N/A;  . CYSTOSCOPY/RETROGRADE/URETEROSCOPY/STONE EXTRACTION WITH BASKET Right 02/12/2013   Procedure: CYSTOSCOPY/RETROGRADE/URETEROSCOPY/STONE EXTRACTION WITH BASKET, WITH  STENT PLACEMENT RIGHT URETERAL DILATION;  Surgeon: [name], MD;  Location: [facility] ORS;  Service: Urology;  Laterality: Right;  . EYE SURGERY Bilateral 1950, 1960, 1964   left, right, left  . HERNIA REPAIR  2005  . KNEE SURGERY Bilateral 1990, 2000  . LITHOTRIPSY    . ROTATOR CUFF REPAIR Left 2000  . TONSILLECTOMY     Family History Family History  Problem Relation Age of Onset  . Stroke Mother   . Hypertension Mother   . Kidney disease Mother        stones  . Nephrolithiasis Brother   . Arthritis Paternal Grandmother   . Heart disease Paternal Grandmother   . Leukemia Father   . Heart disease Father   . Nephrolithiasis Father   . Kidney cancer Son   . Appendicitis Son   . Heart attack Neg Hx   . Colon cancer Neg Hx   . Esophageal cancer Neg Hx   . Pancreatic cancer Neg Hx   . Stomach  cancer Neg Hx   . Liver disease Neg Hx     Social History Social History   Tobacco Use  . Smoking status: Never Smoker  . Smokeless  tobacco: Never Used  Substance Use Topics  . Alcohol use: No  . Drug use: No   Allergies Chocolate, Chocolate flavor, Dextroamphetamine, Dextromethorphan, Flomax [tamsulosin hcl], Floxin [ofloxacin], Guaifenesin, Lisinopril, Tavist allergy [clemastine fumarate], Clemastine, Robitussin cold+flu daytime  [dm-phenylephrine-acetaminophen], Tamsulosin, Amiodarone, and Robitussin cf [pseudoephedrine-dm-gg]  Review of Systems Review of Systems All other systems are reviewed and are negative for acute change except as noted in the HPI  Physical Exam Vital Signs  I have reviewed the triage vital signs BP 109/73 (BP Location: Left Arm)   Pulse 63   Temp (!) 97.5 F (36.4 C) (Oral)   Resp 18   Ht 6' 1"  (1.854 m)   Wt 86.2 kg   SpO2 100%   BMI 25.07 kg/m   Physical Exam Vitals reviewed.  Constitutional:      General: He is not in acute distress.    Appearance: He is well-developed. He is not diaphoretic.  HENT:     Head: Normocephalic and atraumatic.     Jaw: No trismus.     Right Ear: External ear normal.     Left Ear: External ear normal.     Nose: Nose normal.  Eyes:     General: No scleral icterus.    Conjunctiva/sclera: Conjunctivae normal.  Neck:     Trachea: Phonation normal.  Cardiovascular:     Rate and Rhythm: Normal rate and regular rhythm.  Pulmonary:     Effort: Pulmonary effort is normal. No respiratory distress.     Breath sounds: No stridor.  Abdominal:     General: There is no distension.     Tenderness: There is abdominal tenderness in the right lower quadrant. There is no guarding or rebound. Positive signs include McBurney's sign. Negative signs include psoas sign and obturator sign.  Musculoskeletal:        General: Normal range of motion.     Cervical back: Normal range of motion.  Neurological:     Mental Status:  He is alert and oriented to person, place, and time.  Psychiatric:        Behavior: Behavior normal.     ED Results and Treatments Labs (all labs ordered are listed, but only abnormal results are displayed) Labs Reviewed  COMPREHENSIVE METABOLIC PANEL - Abnormal; Notable for the following components:      Result Value   CO2 18 (*)    Glucose, Bld 192 (*)    BUN 27 (*)    All other components within normal limits  CBC - Abnormal; Notable for the following components:   WBC 18.5 (*)    RBC 6.32 (*)    Hemoglobin 18.2 (*)    HCT 55.7 (*)    All other components within normal limits  LIPASE, BLOOD  URINALYSIS, ROUTINE W REFLEX MICROSCOPIC                                                                                                                         EKG  EKG Interpretation  Date/Time:    Ventricular Rate:    PR Interval:    QRS Duration:   QT Interval:    QTC Calculation:   R Axis:     Text Interpretation:        Radiology No results found.  Pertinent labs & imaging results that were available during my care of the patient were reviewed by me and considered in my medical decision making (see chart for details).  Medications Ordered in ED Medications  sodium chloride flush (NS) 0.9 % injection 3 mL (has no administration in time range)  oxyCODONE-acetaminophen (PERCOCET/ROXICET) 5-325 MG per tablet 1 tablet (1 tablet Oral Given 11/20/19 0527)  ondansetron (ZOFRAN-ODT) disintegrating tablet 4 mg (4 mg Oral Given 11/20/19 0527)                                                                                                                                    Procedures .Critical Care Performed by: Fatima Blank, MD Authorized by: Fatima Blank, MD    CRITICAL CARE Performed by: Grayce Sessions Minh Jasper Total critical care time: 35 minutes Critical care time was exclusive of separately billable procedures and treating other patients. Critical care  was necessary to treat or prevent imminent or life-threatening deterioration. Critical care was time spent personally by me on the following activities: development of treatment plan with patient and/or surrogate as well as nursing, discussions with consultants, evaluation of patient's response to treatment, examination of patient, obtaining history from patient or surrogate, ordering and performing treatments and interventions, ordering and review of laboratory studies, ordering and review of radiographic studies, pulse oximetry and re-evaluation of patient's condition.    (including critical care time)  Medical Decision Making / ED Course I have reviewed the nursing notes for this encounter and the patient's prior records (if available in EHR or on provided paperwork).   Francis Campbell was evaluated in Emergency Department on 11/20/2019 for the symptoms described in the history of present illness. He was evaluated in the context of the global COVID-19 pandemic, which necessitated consideration that the patient might be at risk for infection with the SARS-CoV-2 virus that causes COVID-19. Institutional protocols and algorithms that pertain to the evaluation of patients at risk for COVID-19 are in a state of rapid change based on information released by regulatory bodies including the CDC and federal and state organizations. These policies and algorithms were followed during the patient's care in the ED.  Right lower quadrant abdominal pain with tenderness to palpation.  Appendicitis versus Crohn's flare versus bowel obstruction.  Labs notable for leukocytosis -some hemoconcentration.  Metabolic panel without significant electrolyte derangements.  He has hyperglycemia without evidence of DKA.  No renal insufficiency.  No evidence of bili obstruction or pancreatitis.  We will obtain a CT scan to rule out the above.  Patient provided with IV fluids, pain medicine.  CT notable for SBO  transition  point at mesh. NGT placement attempted, but difficult. Plan to consult surgery.  Patient care turned over to Dr Sedonia Small. Patient case and results discussed in detail; please see their note for further ED managment.          Final Clinical Impression(s) / ED Diagnoses Final diagnoses:  Small bowel obstruction ([place])      This chart was dictated using voice recognition software.  Despite best efforts to proofread,  errors can occur which can change the documentation meaning.   Fatima Blank, MD 11/20/19 [phone]

## 2019-11-20 NOTE — H&P (Signed)
TRH H&P   Patient Demographics:    Francis Campbell, is a 74 y.o. male  MRN: [id number]   DOB - 09/17/1946  Admit Date - 11/20/2019  Outpatient Primary MD for the patient is [name], MD  Referring MD/NP/PA: Dr Sedonia Small  Patient coming from: Home  Chief Complaint  Patient presents with  . Abdominal Pain      HPI:    Francis Campbell  is a 74 y.o. male, with past medical history of a flutter, on warfarin, remote Crohn's disease, GERD, kidney stones, hyperlipidemia, hypertension, TIA, presents to ED secondary to planes of abdominal pain and nausea, patient reports he has been in his usual state of health, started to have nausea, colicky abdominal pain yesterday afternoon, has been progressive, intermittent, reports nausea, no vomiting, he reports not passing gas only one small episode of bowel movement overnight, denies fever, chills, dysuria, diarrhea, no coffee-ground emesis or bright blood per rectum, reports he got his second with vaccine dose last Tuesday. - in ED patient abdomen was noted to be distended, CT abdomen pelvis significant for SBO, work-up significant for leukocytosis at 18 K, as well elevated hemoglobin at 18.2, and it has been stable at 1.03, with elevated glucose level of 192, given comorbidities surgical service requested.  Hospitalist to be primary MD will consult regarding SBO.    Review of systems:    In addition to the HPI above,  No Fever-chills, No Headache, No changes with Vision or hearing, No problems swallowing food or Liquids, No Chest pain, Cough or Shortness of Breath, Ports abdominal pain, nausea, no flatus, but he has no vomiting . No Blood in stool or Urine, No dysuria, No new skin rashes or bruises, No new joints pains-aches,  No new weakness, tingling, numbness in any extremity, No recent weight gain or loss, No polyuria, polydypsia  or polyphagia, No significant Mental Stressors.  A full 10 point Review of Systems was done, except as stated above, all other Review of Systems were negative.   With Past History of the following :    Past Medical History:  Diagnosis Date  . Allergy   . Aortic regurgitation   . Arthritis   . Asthma    only as child  . Atrial flutter ([place])    a. Dx 09/2012.  . Crohn disease ([place])    Remotely - in the 1990s  . Esophageal reflux    Remotely  . History of kidney stones   . Hypercholesteremia   . Hypertension   . Paroxysmal atrial fibrillation (HCC)    Takes PRN flecainide  . 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  [name] TIA (transient ischemic attack)    ~2005 - had visual symptoms, was told by [name] this may have been a TIA but it's unclear  . Valvular heart disease  Mild AI, ASclerosis without AS by echo 10/2011      Past Surgical History:  Procedure Laterality Date  . CARDIOVERSION N/A 05/08/2013   Procedure: CARDIOVERSION;  Surgeon: Darlin Coco, MD;  Location: [facility] ENDOSCOPY;  Service: Cardiovascular;  Laterality: N/A;  . CYSTOSCOPY/RETROGRADE/URETEROSCOPY/STONE EXTRACTION WITH BASKET Right 02/12/2013   Procedure: CYSTOSCOPY/RETROGRADE/URETEROSCOPY/STONE EXTRACTION WITH BASKET, WITH  STENT PLACEMENT RIGHT URETERAL DILATION;  Surgeon: [name], MD;  Location: [facility] ORS;  Service: Urology;  Laterality: Right;  . EYE SURGERY Bilateral 1950, 1960, 1964   left, right, left  . HERNIA REPAIR  2005  . KNEE SURGERY Bilateral 1990, 2000  . LITHOTRIPSY    . ROTATOR CUFF REPAIR Left 2000  . TONSILLECTOMY        Social History:     Social History   Tobacco Use  . Smoking status: Never Smoker  . Smokeless tobacco: Never Used  Substance Use Topics  . Alcohol use: No       Family History :     Family History  Problem Relation Age of Onset  . Stroke Mother   . Hypertension Mother   . Kidney disease Mother        stones  .  Nephrolithiasis Brother   . Arthritis Paternal Grandmother   . Heart disease Paternal Grandmother   . Leukemia Father   . Heart disease Father   . Nephrolithiasis Father   . Kidney cancer Son   . Appendicitis Son   . Heart attack Neg Hx   . Colon cancer Neg Hx   . Esophageal cancer Neg Hx   . Pancreatic cancer Neg Hx   . Stomach cancer Neg Hx   . Liver disease Neg Hx      Home Medications:   Prior to Admission medications   Medication Sig Start Date End Date Taking? Authorizing Provider  acetaminophen (TYLENOL) 325 MG tablet Take 325 mg by mouth every 6 (six) hours as needed for mild pain or headache.   Yes [provider]  ALPRAZolam (XANAX) 0.25 MG tablet TAKE 1 TABLET BY MOUTH AT BEDTIME AS NEEDED FOR ANXIETY Patient taking differently: Take 0.25 mg by mouth at bedtime as needed for anxiety or sleep. TAKE 1 TABLET BY MOUTH AT BEDTIME AS NEEDED FOR ANXIETY 08/17/19  Yes Burchette, Alinda Sierras, MD  Cholecalciferol (VITAMIN D3) 1000 UNITS CAPS Take 2,000 Units by mouth every morning.    Yes [provider]  diltiazem (CARTIA XT) 240 MG 24 hr capsule Take 240 mg by mouth in the morning 12/01/18  Yes Skeet Latch, MD  flecainide (TAMBOCOR) 100 MG tablet Take 1 tablet (100 mg total) by mouth 2 (two) times daily. 11/12/19  Yes Skeet Latch, MD  losartan (COZAAR) 25 MG tablet TAKE 1 TABLET EVERY DAY Patient taking differently: Take 25 mg by mouth daily.  11/02/19  Yes Skeet Latch, MD  metoprolol succinate (TOPROL-XL) 25 MG 24 hr tablet Take 1 tablet (25 mg total) by mouth daily. Take with or immediately following a meal. 11/12/19  Yes Skeet Latch, MD  Multiple Vitamin (MULTIVITAMIN) tablet Take 1 tablet by mouth daily.   Yes [provider]  potassium citrate (UROCIT-K) 10 MEQ (1080 MG) SR tablet Take 10 mEq by mouth in the morning and at bedtime.  11/17/15  Yes [provider]  rosuvastatin (CRESTOR) 20 MG tablet Take 1 tablet (20 mg total)  by mouth daily. TAKE 1 TABLET EVERY DAY 11/12/19  Yes Skeet Latch, MD  sertraline (ZOLOFT) 50 MG  tablet Take 1.5 tablets (75 mg total) by mouth daily. Patient taking differently: Take 50 mg by mouth daily.  11/03/19  Yes Burchette, Alinda Sierras, MD  Turmeric 500 MG CAPS Take 500 mg by mouth daily.   Yes [provider]  warfarin (COUMADIN) 5 MG tablet TAKE 1 TO 2 TABLETS DAILY AS  DIRECTED BY COUMADIN CLINIC Patient taking differently: Take 5-7.5 mg by mouth See admin instructions. Mon, Wed, Friday 7.79m Tuesday, Thursday, Saturday, Sunday 521m2/15/21  Yes Meng, [place]oxin (LANOXIN) 0.125 MG tablet Take 1 tablet (125 mcg total) by mouth daily. 11/26/11 11/27/11  BrDarlin CocoMD  losartan-hydrochlorothiazide ([facility]-12.5 MG per tablet  08/26/11 11/27/11  [provider]  pantoprazole (PROTONIX) 40 MG tablet Take 1 tablet (40 mg total) by mouth daily. 10/29/11 11/27/11  PaSable FeilMD     Allergies:     Allergies  Allergen Reactions  . Chocolate Palpitations  . Chocolate Flavor Shortness Of Breath    Other reaction(s): Respiratory Distress  . Dextroamphetamine Shortness Of Breath    Other reaction(s): Respiratory Distress  . Dextromethorphan Palpitations  . Flomax [Tamsulosin Hcl] Other (See Comments)    ? Causes A Fib  . Floxin [Ofloxacin] Other (See Comments)    hallucinations  . Guaifenesin Shortness Of Breath  . Lisinopril Cough  . Tavist Allergy [Clemastine Fumarate] Other (See Comments)    A fib worse  . Clemastine     Other reaction(s): Irregular Heart Rate  . Robitussin Cold+Flu Daytime  [Dm-Phenylephrine-Acetaminophen]     Other reaction(s): Respiratory Distress  . Tamsulosin Other (See Comments)    Other reaction(s): Irregular Heart Rate  . Amiodarone Other (See Comments)    Abn  thyroid  . Robitussin Cf [Pseudoephedrine-Dm-Gg] Palpitations     Physical Exam:   Vitals  Blood pressure 125/77, pulse 71, temperature (!) 97.5 F  (36.4 C), temperature source Oral, resp. rate (!) 24, height 6' 1"  (1.854 m), weight 86.2 kg, SpO2 100 %.   1. General developed male, lying in bed in NAD,    2. Normal affect and insight, Not Suicidal or Homicidal, Awake Alert, Oriented X 3.  3. No F.N deficits, ALL C.Nerves Intact, Strength 5/5 all 4 extremities, Sensation intact all 4 extremities, Plantars down going.  4. Ears and Eyes appear Normal, Conjunctivae clear, PERRLA. Moist Oral Mucosa.  5. Supple Neck, No JVD, No cervical lymphadenopathy appriciated, No Carotid Bruits.  6. Symmetrical Chest wall movement, Good air movement bilaterally, CTAB.  7. RRR, No Gallops, Rubs or Murmurs, No Parasternal Heave.  8.  Hyperperistaltic bowel sounds, abdomen mildly distended, with mild tenderness in left lower abdomen , No organomegaly appriciated,No rebound -guarding or rigidity.  9.  No Cyanosis, Normal Skin Turgor, No Skin Rash or Bruise.  10. Good muscle tone,  joints appear normal , no effusions, Normal ROM.  11. No Palpable Lymph Nodes in Neck or Axillae     Data Review:    CBC Recent Labs  Lab 11/20/19 0533  WBC 18.5*  HGB 18.2*  HCT 55.7*  PLT 221  MCV 88.1  MCH 28.8  MCHC 32.7  RDW 13.3   ------------------------------------------------------------------------------------------------------------------  Chemistries  Recent Labs  Lab 11/20/19 0533  NA 136  K 3.8  CL 104  CO2 18*  GLUCOSE 192*  BUN 27*  CREATININE 1.03  CALCIUM 9.9  AST 29  ALT 34  ALKPHOS 69  BILITOT 1.0   ------------------------------------------------------------------------------------------------------------------ estimated creatinine clearance is 73.3 mL/min (by C-G  formula based on SCr of 1.03 mg/dL). ------------------------------------------------------------------------------------------------------------------ No results for input(s): TSH, T4TOTAL, T3FREE, THYROIDAB in the last 72 hours.  Invalid input(s):  FREET3  Coagulation profile No results for input(s): INR, PROTIME in the last 168 hours. ------------------------------------------------------------------------------------------------------------------- No results for input(s): DDIMER in the last 72 hours. -------------------------------------------------------------------------------------------------------------------  Cardiac Enzymes No results for input(s): CKMB, TROPONINI, MYOGLOBIN in the last 168 hours.  Invalid input(s): CK ------------------------------------------------------------------------------------------------------------------    Component Value Date/Time   BNP 48.2 09/12/2017 1428     ---------------------------------------------------------------------------------------------------------------  Urinalysis    Component Value Date/Time   COLORURINE YELLOW 02/07/2013 2007   APPEARANCEUR CLEAR 02/07/2013 2007   LABSPEC 1.015 02/07/2013 2007   PHURINE 6.0 02/07/2013 2007   GLUCOSEU NEGATIVE 02/07/2013 2007   HGBUR SMALL (A) 02/07/2013 2007   BILIRUBINUR neg 12/30/2017 [place] 02/07/2013 2007   PROTEINUR neg 12/30/2017 1143   PROTEINUR NEGATIVE 02/07/2013 2007   UROBILINOGEN 0.2 12/30/2017 1143   UROBILINOGEN 0.2 02/07/2013 2007   NITRITE neg 12/30/2017 1143   NITRITE NEGATIVE 02/07/2013 2007   LEUKOCYTESUR Negative 12/30/2017 1143    ----------------------------------------------------------------------------------------------------------------   Imaging Results:    CT ABDOMEN PELVIS W CONTRAST  Result Date: 11/20/2019 CLINICAL DATA:  Right lower quadrant pain. Appendicitis suspected. EXAM: CT ABDOMEN AND PELVIS WITH CONTRAST TECHNIQUE: Multidetector CT imaging of the abdomen and pelvis was performed using the standard protocol following bolus administration of intravenous contrast. CONTRAST:  144m OMNIPAQUE IOHEXOL 300 MG/ML  SOLN COMPARISON:  CT of the abdomen and pelvis 07/14/2015  FINDINGS: Lower chest: Calcified nodule in the right lower lobe is stable. The lungs are otherwise clear. Heart size is normal. Artery calcifications are evident. No significant pleural or pericardial effusion is present. Hepatobiliary: Small up attic cysts are stable. No new lesions are present. Layering stones are present in the gallbladder. Inflammatory changes are present to suggest cholecystitis. The common bile duct is within normal. Pancreas: Unremarkable. No pancreatic ductal dilatation or surrounding inflammatory changes. Spleen: Normal in size without focal abnormality. Adrenals/Urinary Tract: Adrenal glands are normal bilaterally. 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 Multiple nonobstructing stones are present in the left kidney. Largest is at the lower pole of the left kidney measuring up 6 mm. Ureters are within normal limits. Layering stones or posterior bladder calcifications are noted. The urinary bladder is otherwise within normal limits. Stomach/Bowel: Stomach and duodenum are within normal limits. Small bowel is dilated to 2.7 cm. Transition point is present in the right lower quadrant, adjacent to the previous mesh hernia repair. The more distal small bowel is collapsed. Appendix is not discretely visualized and may be surgically absent. No inflammatory changes are present along the ileocolic ligament. The ascending and transverse colon are within normal limits. The descending and sigmoid colon are normal. Vascular/Lymphatic: Atherosclerotic calcifications are present in the aorta and branch vessels without aneurysm. Calcifications are present at the origin of the celiac artery without significant stenosis. 50% narrowing is noted at the origin of the SMA. Reproductive: The prostate is enlarged, measuring 5.9 cm in transverse diameter. No discrete lesion is present. Other: No abdominal wall hernia or abnormality. No abdominopelvic ascites. Musculoskeletal: Vertebral body heights and  alignment are maintained. No focal lytic or blastic lesions are present. Bony pelvis is within normal limits. The hips are located and within normal limits. IMPRESSION: 1. Small bowel obstruction with transition point in the right lower quadrant adjacent to the previous mesh hernia repair. 2. No significant inflammatory changes are present along the  ileocolic ligament to suggest appendicitis. The appendix is not discretely visualized. 3. Multiple nonobstructing stones in the left kidney. 4. Cholelithiasis without cholecystitis. 5. Stable hepatic and renal cysts. 6. Enlarged prostate gland without a discrete lesion. 7. Aortic Atherosclerosis (ICD10-I70.0). Electronically Signed   By: San Morelle M.D.   On: 11/20/2019 07:30    My personal review of EKG: EKG has not been ordered yet, I will order EKG   Assessment & Plan:    Active Problems:   Leucocytosis   Abdominal pain   Anticoagulated on Coumadin   AF (atrial fibrillation) (HCC)   Benign hypertensive heart disease without heart failure   SBO (small bowel obstruction) (HCC)   Small bowel obstruction -Peers to be complete bowel obstruction, as he is not passing any bowel or gas, most likely due to his previous hernia surgery. -Management per general surgery. -If unable to insert NG tube despite 8 attempts, given no vomiting will continue to monitor without NG tube currently, continue with IV fluids, continue with as needed IV morphine for pain, continue with bowel rest, will ambulate early if possible.  Paroxysmal atrial fibrillation -He is currently in normal sinus rhythm, given SBO and n.p.o. status will hold Cardizem, Tikosyn and metoprolol, will keep on scheduled metoprolol 5 mg IV every 6 hours. -Warfarin on hold given n.p.o. status, and possible need for surgery, will keep on subcu Lovenox.  Hypertension -On scheduled IV metoprolol, will keep on as needed hydralazine as well.  Hyperlipidemia -Hold statin given n.p.o.  status.  Leukocytosis -Nontoxic-appearing, this is most likely stress related from small bowel obstruction, afebrile.  Polycythemia -This is most likely related to hydration, but overall his hemoglobin on the higher side, should improve with IV hydration, follow with PCP as an outpatient.  DVT Prophylaxis   AM Labs Ordered, also please review Full Orders  Family Communication: Admission, patients condition and plan of care including tests being ordered have been discussed with the patient  who indicate understanding and agree with the plan and Code Status.  Code Status Full  Likely DC to  Home  Condition GUARDED   Consults called: Surgery BY ED  Admission status:  Inpatient  Time spent in minutes : 60 minutes   Phillips Climes M.D on 11/20/2019 at 8:28 AM  Between 7am to 7pm - Pager - [phone]. After 7pm go to [email or web address] - password [facility]  [facility] - Office  [phone]

## 2019-11-20 NOTE — ED Triage Notes (Signed)
Patient is complaining of right lower abdominal pain that started 11/19/19 around 4pm. Patient states it hurts really bad.

## 2019-11-20 NOTE — ED Notes (Signed)
Attempted ng tube placement 4 times each nostril with no success. ED MD Bero tried unsuccessfully. Attempted to guide tube by placing a nasal trumpet, no success. Surgery MD notified, said as long as patient is not vomiting we can hold off on placing ng tube at this time.

## 2019-11-20 NOTE — ED Provider Notes (Signed)
  Provider Note MRN:  [id number]  Arrival date & time: 11/20/19    ED Course and Medical Decision Making  Assumed care from Dr. Leonette Monarch at shift change.  CT to exclude appendicitis, reassess thereafter.  CT revealed small bowel obstruction, discussed with general surgery who recommended hospitalist admission.  Admitted to hospital service for further care.  .Critical Care Performed by: Maudie Flakes, MD Authorized by: Maudie Flakes, MD   Critical care provider statement:    Critical care time (minutes):  32   Critical care was necessary to treat or prevent imminent or life-threatening deterioration of the following conditions: Small bowel obstruction.   Critical care was time spent personally by me on the following activities:  Discussions with consultants, evaluation of patient's response to treatment, examination of patient, ordering and performing treatments and interventions, ordering and review of laboratory studies, ordering and review of radiographic studies, pulse oximetry, re-evaluation of patient's condition, obtaining history from patient or surrogate and review of old charts   I assumed direction of critical care for this patient from another provider in my specialty: yes      Final Clinical Impressions(s) / ED Diagnoses     ICD-10-CM   1. Encounter for imaging study to confirm nasogastric (NG) tube placement  Z01.89 CANCELED: DG Abd Portable 1V-Small Bowel Protocol-Position Verification    CANCELED: DG Abd Portable 1V-Small Bowel Protocol-Position Verification  2. Small bowel obstruction (HCC)  K56.609 DG Abd Portable 1V-Small Bowel Obstruction Protocol-initial, 8 hr delay    DG Abd Portable 1V-Small Bowel Obstruction Protocol-initial, 8 hr delay    ED Discharge Orders    None      Discharge Instructions   None     Barth Kirks. Sedonia Small, [place] [email or web address]    Maudie Flakes, MD 11/20/19 [phone]

## 2019-11-20 NOTE — Progress Notes (Signed)
ANTICOAGULATION CONSULT NOTE - Initial Consult  Pharmacy Consult for enoxaparin Indication: atrial fibrillation  Allergies  Allergen Reactions  . Chocolate Palpitations  . Chocolate Flavor Shortness Of Breath    Other reaction(s): Respiratory Distress  . Dextroamphetamine Shortness Of Breath    Other reaction(s): Respiratory Distress  . Dextromethorphan Palpitations  . Flomax [Tamsulosin Hcl] Other (See Comments)    ? Causes A Fib  . Floxin [Ofloxacin] Other (See Comments)    hallucinations  . Guaifenesin Shortness Of Breath  . Lisinopril Cough  . Tavist Allergy [Clemastine Fumarate] Other (See Comments)    A fib worse  . Clemastine     Other reaction(s): Irregular Heart Rate  . Robitussin Cold+Flu Daytime  [Dm-Phenylephrine-Acetaminophen]     Other reaction(s): Respiratory Distress  . Tamsulosin Other (See Comments)    Other reaction(s): Irregular Heart Rate  . Amiodarone Other (See Comments)    Abn  thyroid  . Robitussin Cf [Pseudoephedrine-Dm-Gg] Palpitations    Patient Measurements: Height: 6' 1"  (185.4 cm) Weight: 186 lb 15.2 oz (84.8 kg) IBW/kg (Calculated) : 79.9 Heparin Dosing Weight:   Vital Signs: Temp: 97.6 F (36.4 C) (03/05 0946) Temp Source: Oral (03/05 0946) BP: 135/92 (03/05 0946) Pulse Rate: 81 (03/05 0946)  Labs: Recent Labs    11/20/19 0533 11/20/19 1001  HGB 18.2*  --   HCT 55.7*  --   PLT 221  --   LABPROT  --  22.1*  INR  --  1.9*  CREATININE 1.03  --     Estimated Creatinine Clearance: 73.3 mL/min (by C-G formula based on SCr of 1.03 mg/dL).   Medical History: Past Medical History:  Diagnosis Date  . Allergy   . Aortic regurgitation   . Arthritis   . Asthma    only as child  . Atrial flutter ([place])    a. Dx 09/2012.  . Crohn disease ([place])    Remotely - in the 1990s  . Esophageal reflux    Remotely  . History of kidney stones   . Hypercholesteremia   . Hypertension   . Paroxysmal atrial fibrillation (HCC)    Takes PRN  flecainide  . 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  [name] TIA (transient ischemic attack)    ~2005 - had visual symptoms, was told by [name] this may have been a TIA but it's unclear  . Valvular heart disease    Mild AI, ASclerosis without AS by echo 10/2011    Medications:  Scheduled:  . diatrizoate meglumine-sodium  90 mL Per NG tube Once  . enoxaparin (LOVENOX) injection  80 mg Subcutaneous Q12H  . metoprolol tartrate  5 mg Intravenous Q6H  . sodium chloride (PF)      . sodium chloride flush  3 mL Intravenous Once    Assessment: Pharmacy is consulted to dose enoxaparin in 74 yo male with PMH of a. flutter. Pt is on warfarin 7.5 mg on MWF and 60m on TuThSatSun.   Today, 11/20/19  Hgb 18.2, plt 221  INR is 1.9  SCr 1.03 mg/dl, CrCl is 73 ml/min   Goal of Therapy:  Monitor platelets by anticoagulation protocol: Yes   Plan:   As INR is <2, start enoxaparin 80 mg SQ q12h   Monitor renal function, CBC as available  Monitor for signs and symptoms of bleeding    NRoyetta Asal PharmD, BCPS 11/20/2019 11:08 AM

## 2019-11-20 NOTE — Progress Notes (Addendum)
[facility] Surgery Consult Note  Francis Campbell [birth year]  [id number].    Requesting MD: Phillips Climes Chief Complaint:  Abdominal pain and nausea. Reason for Consult: SBO   HPI:  Pt is a 74 y/o with hx PAF on coumadin,  Hx TIA, mild AI, hypertension, hyperlipidemia, GERD, hx of prior hernia repair.presents with RLQ abdominal pain that started about 4 PM yesterday.  Work up in the ED: afebrile, BP up some.  K+ 3.8, glucose 192, creatinine 1.03.other labs are normal, WBC 18.5, H/H 18.2/55.7, platelets 221K.  INR 1.7 CT scan shows:   1.Small bowel obstruction with transition point in the right lower quadrant adjacent to the previous mesh hernia repair. 2. No significant inflammatory changes are present along the ileocolic ligament to suggest appendicitis. The appendix is not discretely visualized. 3. Multiple nonobstructing stones in the left kidney. 4. Cholelithiasis without cholecystitis. 5. Stable hepatic and renal cysts. 6. Enlarged prostate gland without a discrete lesion.  ROS: Review of Systems  Constitutional: Positive for chills (shaking and chills) and fever (no recorded fever). Negative for diaphoresis, malaise/fatigue and weight loss.  HENT: Negative.   Eyes: Negative.   Respiratory: Negative.   Cardiovascular: Positive for palpitations (he get AF a couple times per year, and just increased his medicines for these episodes, he usually converts pretty quickly.). Negative for orthopnea, claudication, leg swelling and PND.  Gastrointestinal: Positive for abdominal pain, constipation, diarrhea, heartburn (occasional) and nausea. Negative for blood in stool, melena and vomiting.       He has had episodes of constipation and diarrhea in the past, but none recently.  Last BM yesterday was very small.  Genitourinary: Negative.   Musculoskeletal: Negative.   Skin: Negative.   Neurological: Negative.   Endo/Heme/Allergies: Bruises/bleeds easily.   Psychiatric/Behavioral: Negative.     Family History  Problem Relation Age of Onset  . Stroke Mother   . Hypertension Mother   . Kidney disease Mother        stones  . Nephrolithiasis Brother   . Arthritis Paternal Grandmother   . Heart disease Paternal Grandmother   . Leukemia Father   . Heart disease Father   . Nephrolithiasis Father   . Kidney cancer Son   . Appendicitis Son   . Heart attack Neg Hx   . Colon cancer Neg Hx   . Esophageal cancer Neg Hx   . Pancreatic cancer Neg Hx   . Stomach cancer Neg Hx   . Liver disease Neg Hx     Past Medical History:  Diagnosis Date  . Allergy   . Aortic regurgitation   . Arthritis   . Asthma    only as child  . Atrial flutter ([place])    a. Dx 09/2012.  . Crohn disease ([place])    Remotely - in the 1990s  . Esophageal reflux    Remotely  . History of kidney stones   . Hypercholesteremia   . Hypertension   . Paroxysmal atrial fibrillation ([facility])    Takes PRN flecainide  . 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  [name] TIA (transient ischemic attack)    ~2005 - had visual symptoms, was told by [name] this may have been a TIA but it's unclear  . Valvular heart disease    Mild AI, ASclerosis without AS by echo 10/2011    Past Surgical History:  Procedure Laterality Date  . CARDIOVERSION N/A 05/08/2013   Procedure: CARDIOVERSION;  Surgeon: Marcello Moores  Mare Ferrari, MD;  Location: [facility];  Service: Cardiovascular;  Laterality: N/A;  . CYSTOSCOPY/RETROGRADE/URETEROSCOPY/STONE EXTRACTION WITH BASKET Right 02/12/2013   Procedure: CYSTOSCOPY/RETROGRADE/URETEROSCOPY/STONE EXTRACTION WITH BASKET, WITH  STENT PLACEMENT RIGHT URETERAL DILATION;  Surgeon: [name], MD;  Location: [facility];  Service: Urology;  Laterality: Right;  . EYE SURGERY Bilateral 1950, 1960, 1964   left, right, left  . HERNIA REPAIR  2005  . KNEE SURGERY Bilateral 1990, 2000  . LITHOTRIPSY    . ROTATOR CUFF REPAIR Left 2000  .  TONSILLECTOMY      Social History:  reports that he has never smoked. He has never used smokeless tobacco. He reports that he does not drink alcohol or use drugs. Tobacco:  None ETOH:  None Drugs:  None   Allergies:  Allergies  Allergen Reactions  . Chocolate Palpitations  . Chocolate Flavor Shortness Of Breath    Other reaction(s): Respiratory Distress  . Dextroamphetamine Shortness Of Breath    Other reaction(s): Respiratory Distress  . Dextromethorphan Palpitations  . Flomax [Tamsulosin Hcl] Other (See Comments)    ? Causes A Fib  . Floxin [Ofloxacin] Other (See Comments)    hallucinations  . Guaifenesin Shortness Of Breath  . Lisinopril Cough  . Tavist Allergy [Clemastine Fumarate] Other (See Comments)    A fib worse  . Clemastine     Other reaction(s): Irregular Heart Rate  . Robitussin Cold+Flu Daytime  [Dm-Phenylephrine-Acetaminophen]     Other reaction(s): Respiratory Distress  . Tamsulosin Other (See Comments)    Other reaction(s): Irregular Heart Rate  . Amiodarone Other (See Comments)    Abn  thyroid  . Robitussin Cf [Pseudoephedrine-Dm-Gg] Palpitations    Medications Prior to Admission  Medication Sig Dispense Refill  . acetaminophen (TYLENOL) 325 MG tablet Take 325 mg by mouth every 6 (six) hours as needed for mild pain or headache.    . ALPRAZolam (XANAX) 0.25 MG tablet TAKE 1 TABLET BY MOUTH AT BEDTIME AS NEEDED FOR ANXIETY (Patient taking differently: Take 0.25 mg by mouth at bedtime as needed for anxiety or sleep. TAKE 1 TABLET BY MOUTH AT BEDTIME AS NEEDED FOR ANXIETY) 30 tablet 3  . Cholecalciferol (VITAMIN D3) 1000 UNITS CAPS Take 2,000 Units by mouth every morning.     . diltiazem (CARTIA XT) 240 MG 24 hr capsule Take 240 mg by mouth in the morning 90 capsule 3  . flecainide (TAMBOCOR) 100 MG tablet Take 1 tablet (100 mg total) by mouth 2 (two) times daily. 180 tablet 2  . losartan (COZAAR) 25 MG tablet TAKE 1 TABLET EVERY DAY (Patient taking  differently: Take 25 mg by mouth daily. ) 90 tablet 1  . metoprolol succinate (TOPROL-XL) 25 MG 24 hr tablet Take 1 tablet (25 mg total) by mouth daily. Take with or immediately following a meal. 90 tablet 3  . Multiple Vitamin (MULTIVITAMIN) tablet Take 1 tablet by mouth daily.    . potassium citrate (UROCIT-K) 10 MEQ (1080 MG) SR tablet Take 10 mEq by mouth in the morning and at bedtime.   11  . rosuvastatin (CRESTOR) 20 MG tablet Take 1 tablet (20 mg total) by mouth daily. TAKE 1 TABLET EVERY DAY 90 tablet 3  . sertraline (ZOLOFT) 50 MG tablet Take 1.5 tablets (75 mg total) by mouth daily. (Patient taking differently: Take 50 mg by mouth daily. ) 135 tablet 0  . Turmeric 500 MG CAPS Take 500 mg by mouth daily.    [name] warfarin (COUMADIN) 5 MG tablet TAKE  1 TO 2 TABLETS DAILY AS  DIRECTED BY COUMADIN CLINIC (Patient taking differently: Take 5-7.5 mg by mouth See admin instructions. Mon, Wed, Friday 7.23m Tuesday, Thursday, Saturday, Sunday 527m 180 tablet 0    Blood pressure (!) 135/92, pulse 81, temperature 97.6 F (36.4 C), temperature source Oral, resp. rate 18, height 6' 1"  (1.854 m), weight 84.8 kg, SpO2 98 %. Physical Exam:  General: pleasant, WD, WN white male who is laying in bed in NAD HEENT: head is normocephalic, atraumatic.  Sclera are noninjected. Pupils are equal   Mouth is pink and moist Heart: regular, rate, and rhythm.  Normal s1,s2. No obvious murmurs, gallops, or rubs noted.  Palpable radial and pedal pulses bilaterally Lungs: CTAB, no wheezes, rhonchi, or rales noted.  Respiratory effort nonlabored Abd: soft, NT, minimal distension, no BS, no masses, hernias, or organomegaly MS: all 4 extremities are symmetrical with no cyanosis, clubbing, or edema. Skin: warm and dry with no masses, lesions, or rashes Neuro: Cranial nerves 2-12 grossly intact, sensation is normal throughout Psych: A&Ox3 with an appropriate affect.   Results for orders placed or performed during the  hospital encounter of 11/20/19 (from the past 48 hour(s))  Lipase, blood     Status: None   Collection Time: 11/20/19  5:33 AM  Result Value Ref Range   Lipase 27 11 - 51 U/L    Comment: Performed at [facility] [place] [id number] metabolic panel     Status: Abnormal   Collection Time: 11/20/19  5:33 AM  Result Value Ref Range   Sodium 136 135 - 145 mmol/L   Potassium 3.8 3.5 - 5.1 mmol/L   Chloride 104 98 - 111 mmol/L   CO2 18 (L) 22 - 32 mmol/L   Glucose, Bld 192 (H) 70 - 99 mg/dL    Comment: Glucose reference range applies only to samples taken after fasting for at least 8 hours.   BUN 27 (H) 8 - 23 mg/dL   Creatinine, Ser 1.03 0.61 - 1.24 mg/dL   Calcium 9.9 8.9 - 10.3 mg/dL   Total Protein 7.7 6.5 - 8.1 g/dL   Albumin 4.7 3.5 - 5.0 g/dL   AST 29 15 - 41 U/L   ALT 34 0 - 44 U/L   Alkaline Phosphatase 69 38 - 126 U/L   Total Bilirubin 1.0 0.3 - 1.2 mg/dL   GFR calc non Af Amer >60 >60 mL/min   GFR calc Af Amer >60 >60 mL/min   Anion gap 14 5 - 15    Comment: Performed at [facility] [place] [id number]     Status: Abnormal   Collection Time: 11/20/19  5:33 AM  Result Value Ref Range   WBC 18.5 (H) 4.0 - 10.5 K/uL   RBC 6.32 (H) 4.22 - 5.81 MIL/uL   Hemoglobin 18.2 (H) 13.0 - 17.0 g/dL   HCT 55.7 (H) 39.0 - 52.0 %   MCV 88.1 80.0 - 100.0 fL   MCH 28.8 26.0 - 34.0 pg   MCHC 32.7 30.0 - 36.0 g/dL   RDW 13.3 11.5 - 15.5 %   Platelets 221 150 - 400 K/uL   nRBC 0.0 0.0 - 0.2 %    Comment: Performed at [facility] [place] [address] CT ABDOMEN PELVIS W CONTRAST  Result Date: 11/20/2019 CLINICAL DATA:  Right lower quadrant pain. Appendicitis suspected. EXAM:  CT ABDOMEN AND PELVIS WITH CONTRAST TECHNIQUE: Multidetector CT imaging of the abdomen and pelvis was performed using the standard protocol following bolus  administration of intravenous contrast. CONTRAST:  114m OMNIPAQUE IOHEXOL 300 MG/ML  SOLN COMPARISON:  CT of the abdomen and pelvis 07/14/2015 FINDINGS: Lower chest: Calcified nodule in the right lower lobe is stable. The lungs are otherwise clear. Heart size is normal. Artery calcifications are evident. No significant pleural or pericardial effusion is present. Hepatobiliary: Small up attic cysts are stable. No new lesions are present. Layering stones are present in the gallbladder. Inflammatory changes are present to suggest cholecystitis. The common bile duct is within normal. Pancreas: Unremarkable. No pancreatic ductal dilatation or surrounding inflammatory changes. Spleen: Normal in size without focal abnormality. Adrenals/Urinary Tract: Adrenal glands are normal bilaterally. 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 Multiple nonobstructing stones are present in the left kidney. Largest is at the lower pole of the left kidney measuring up 6 mm. Ureters are within normal limits. Layering stones or posterior bladder calcifications are noted. The urinary bladder is otherwise within normal limits. Stomach/Bowel: Stomach and duodenum are within normal limits. Small bowel is dilated to 2.7 cm. Transition point is present in the right lower quadrant, adjacent to the previous mesh hernia repair. The more distal small bowel is collapsed. Appendix is not discretely visualized and may be surgically absent. No inflammatory changes are present along the ileocolic ligament. The ascending and transverse colon are within normal limits. The descending and sigmoid colon are normal. Vascular/Lymphatic: Atherosclerotic calcifications are present in the aorta and branch vessels without aneurysm. Calcifications are present at the origin of the celiac artery without significant stenosis. 50% narrowing is noted at the origin of the SMA. Reproductive: The prostate is enlarged, measuring 5.9 cm in transverse diameter. No discrete  lesion is present. Other: No abdominal wall hernia or abnormality. No abdominopelvic ascites. Musculoskeletal: Vertebral body heights and alignment are maintained. No focal lytic or blastic lesions are present. Bony pelvis is within normal limits. The hips are located and within normal limits. IMPRESSION: 1. Small bowel obstruction with transition point in the right lower quadrant adjacent to the previous mesh hernia repair. 2. No significant inflammatory changes are present along the ileocolic ligament to suggest appendicitis. The appendix is not discretely visualized. 3. Multiple nonobstructing stones in the left kidney. 4. Cholelithiasis without cholecystitis. 5. Stable hepatic and renal cysts. 6. Enlarged prostate gland without a discrete lesion. 7. Aortic Atherosclerosis (ICD10-I70.0). Electronically Signed   By: CSan MorelleM.D.   On: 11/20/2019 07:30   . lactated ringers with kcl       Assessment/Plan PAF on Coumadin3 Hx of TIA Hx of Crohn's disease - 30 years ago/no rx GERD Hypertension Nephrolithiasis Hyperlipidemia  Multiple drug allergies (10)    SBO with hx of hernia repair with mesh  FEN: IV fluids/NPO ID:  None DVT:  INR 1.7 Follow up:  TBD  Plan:  Pt reports 9 attempts to get an NG in and all failed.  He has had nausea, but no vomiting.  SBP ordered, we can try giving contrast without the NG. I will hold off on that till I talk with Dr. GHarlow Asa  If he develops nausea and vomiting we can ask Radiology to try placing the tube.  He had the hernia repair with Mesh about 20 years ago, and he thinks Dr. MHassell Donedid his surgery. We will follow and see how he progresses.      JEarnstine Regal PKaiser [facility]nd [facility]  Surgery 11/20/2019, 9:53 AM Please see Amion for pager number during day hours 7:00am-4:30pm

## 2019-11-20 NOTE — ED Notes (Signed)
Patient stated he is not allergic to percocet.

## 2019-11-21 ENCOUNTER — Encounter (HOSPITAL_COMMUNITY): Payer: Self-pay | Admitting: Internal Medicine

## 2019-11-21 ENCOUNTER — Inpatient Hospital Stay (HOSPITAL_COMMUNITY): Payer: Medicare Other

## 2019-11-21 DIAGNOSIS — K6389 Other specified diseases of intestine: Secondary | ICD-10-CM | POA: Diagnosis not present

## 2019-11-21 DIAGNOSIS — R198 Other specified symptoms and signs involving the digestive system and abdomen: Secondary | ICD-10-CM

## 2019-11-21 DIAGNOSIS — K5669 Other partial intestinal obstruction: Secondary | ICD-10-CM | POA: Diagnosis not present

## 2019-11-21 DIAGNOSIS — K56609 Unspecified intestinal obstruction, unspecified as to partial versus complete obstruction: Secondary | ICD-10-CM | POA: Diagnosis not present

## 2019-11-21 LAB — COMPREHENSIVE METABOLIC PANEL
ALT: 24 U/L (ref 0–44)
AST: 21 U/L (ref 15–41)
Albumin: 3.4 g/dL — ABNORMAL LOW (ref 3.5–5.0)
Alkaline Phosphatase: 52 U/L (ref 38–126)
Anion gap: 7 (ref 5–15)
BUN: 23 mg/dL (ref 8–23)
CO2: 24 mmol/L (ref 22–32)
Calcium: 8.3 mg/dL — ABNORMAL LOW (ref 8.9–10.3)
Chloride: 111 mmol/L (ref 98–111)
Creatinine, Ser: 0.88 mg/dL (ref 0.61–1.24)
GFR calc Af Amer: >60 mL/min (ref >60)
GFR calc non Af Amer: >60 mL/min (ref >60)
Glucose, Bld: 84 mg/dL (ref 70–99)
Potassium: 3.7 mmol/L (ref 3.5–5.1)
Sodium: 142 mmol/L (ref 135–145)
Total Bilirubin: 1.2 mg/dL (ref 0.3–1.2)
Total Protein: 5.7 g/dL — ABNORMAL LOW (ref 6.5–8.1)

## 2019-11-21 LAB — CBC
HCT: 45.6 % (ref 39.0–52.0)
Hemoglobin: 14.4 g/dL (ref 13.0–17.0)
MCH: 28.6 pg (ref 26.0–34.0)
MCHC: 31.6 g/dL (ref 30.0–36.0)
MCV: 90.5 fL (ref 80.0–100.0)
Platelets: 174 10*3/uL (ref 150–400)
RBC: 5.04 MIL/uL (ref 4.22–5.81)
RDW: 13.6 % (ref 11.5–15.5)
WBC: 7.5 10*3/uL (ref 4.0–10.5)
nRBC: 0 % (ref 0.0–0.2)

## 2019-11-21 LAB — PROTIME-INR
INR: 1.7 — ABNORMAL HIGH (ref 0.8–1.2)
Prothrombin Time: 19.8 seconds — ABNORMAL HIGH (ref 11.4–15.2)

## 2019-11-21 NOTE — Care Management CC44 (Signed)
Condition Code 44 Documentation Completed  Patient Details  Name: Francis Campbell MRN: [id number] Date of Birth: 15-Nov-1945   Condition Code 44 given:  Yes Patient signature on Condition Code 44 notice:  Yes Documentation of 2 MD's agreement:  Yes Code 44 added to claim:  Yes    Joaquin Courts, RN 11/21/2019, 3:08 PM

## 2019-11-21 NOTE — Progress Notes (Signed)
Francis Campbell [id number] 1946-06-07  CARE TEAM:  PCP: [name], MD  Outpatient Care Team: Patient Care Team: [name], MD as PCP - General (Family Medicine) Skeet Latch, MD as PCP - Cardiology (Cardiology)  Inpatient Treatment Team: Treatment Team: Attending Provider: Harold Hedge, MD; Consulting Physician: Nolon Nations, MD; Technician: Kizzie Furnish, NT; Rounding Team: Garner Gavel, MD; Registered Nurse: Michaela Corner, RN; Technician: Reynaldo Minium, [place]; Student Nurse: Ilsa Iha, Student-RN   Problem List:   Active Problems:   Leucocytosis   Abdominal pain   Anticoagulated on Coumadin   AF (atrial fibrillation) ([place])   Benign hypertensive heart disease without heart failure   SBO (small bowel obstruction) ([place])      * No surgery found *      Assessment  Distal small bowel obstruction with history of Crohn's disease resolving nonoperatively  [facility] Stay = 1 days)  Plan:  -Contrast in colon on 8-hour film argues against complete obstruction.  Transition zone and very distal ileum.  Do not know if this could be a chronic stricture from Crohn's or adhesions from his prior hernia repair 20 years ago since tacks are nearby.  Regardless, resolving nonoperatively which is reassuring  Liquid diet.  Advance to soft diet as tolerated.  Hopefully can be discharged in the morning if continues to improve rapidly.  Discussed with the patient as well as Dr. Maryln Gottron with the primary hospitalist service  Fiber bowel regimen to minimize constipation.  I strongly recommend he try and be more compliant with his gastroenterologist recommendations.  Consider follow-up with gastroenterology if has persistent symptoms with CT enterography to rule out other issues.  Hopefully not too likely  VTE prophylaxis- SCDs, etc  -mobilize as tolerated to help recovery  20 minutes spent in review, evaluation, examination, counseling, and  coordination of care.  More than 50% of that time was spent in counseling.  11/21/2019    Subjective: (Chief complaint)  Numerous loose bowel movements and flatus.  Patient feels much better overall.  He does confess to a history of intermittent constipation and diarrhea.  Had seen Dr. Ardis Hughs with gastroenterology in December sometimes gets full with solid foods.  Has been told be on Citrucel but uses intermittently  Objective:  Vital signs:  Vitals:   11/20/19 1817 11/20/19 2041 11/21/19 0458 11/21/19 0803  BP: (!) 148/79 (!) 144/80 (!) 141/89 (!) 156/81  Pulse: 74 71 72 65  Resp: 18 (!) 22 16   Temp: 98.2 F (36.8 C) 98 F (36.7 C) 97.6 F (36.4 C) 98.9 F (37.2 C)  TempSrc: Oral Oral Oral Oral  SpO2: 96% 98% 97% 97%  Weight:      Height:        Last BM Date: 11/21/19  Intake/Output   Yesterday:  03/05 0701 - 03/06 0700 In: 3390.1 [P.O.:90; I.V.:1300.1; IV Piggyback:2000] Out: 1250 [Urine:1250] This shift:  No intake/output data recorded.  Bowel function:  Flatus: YES  BM:  YES  Drain: (No drain)   Physical Exam:  General: Pt awake/alert in no acute distress Eyes: PERRL, normal EOM.  Sclera clear.  No icterus Neuro: CN II-XII intact w/o focal sensory/motor deficits. Lymph: No head/neck/groin lymphadenopathy Psych:  No delerium/psychosis/paranoia.  Oriented x 4 HENT: Normocephalic, Mucus membranes moist.  No thrush Neck: Supple, No tracheal deviation.  No obvious thyromegaly Chest: No pain to chest wall compression.  Good respiratory excursion.  No audible wheezing CV:  Pulses intact.  Regular rhythm.  No major extremity edema MS: Normal AROM mjr joints.  No obvious deformity  Abdomen: Soft.  Nondistended.  Nontender.  No evidence of peritonitis.  No incarcerated hernias.  GU: No recurrent inguinal hernias.  No lymphadenopathy Ext:   No deformity.  No mjr edema.  No cyanosis Skin: No petechiae / purpurea.  No major sores.  Warm and  dry    Results:   Cultures: Recent Results (from the past 720 hour(s))  SARS CORONAVIRUS 2 (TAT 6-24 HRS) Nasopharyngeal Nasopharyngeal Swab     Status: None   Collection Time: 11/20/19  7:43 AM   Specimen: Nasopharyngeal Swab  Result Value Ref Range Status   SARS Coronavirus 2 NEGATIVE NEGATIVE Final    Comment: (NOTE) SARS-CoV-2 target nucleic acids are NOT DETECTED. The SARS-CoV-2 RNA is generally detectable in upper and lower respiratory specimens during the acute phase of infection. Negative results do not preclude SARS-CoV-2 infection, do not rule out co-infections with other pathogens, and should not be used as the sole basis for treatment or other patient management decisions. Negative results must be combined with clinical observations, patient history, and epidemiological information. The expected result is Negative. Fact Sheet for Patients: SugarRoll.be Fact Sheet for Healthcare Providers: https://www.woods-mathews.com/ This test is not yet approved or cleared by the [place] FDA and  has been authorized for detection and/or diagnosis of SARS-CoV-2 by FDA under an Emergency Use Authorization (EUA). This EUA will remain  in effect (meaning this test can be used) for the duration of the COVID-19 declaration under Section 56 4(b)(1) of the Act, 21 U.S.C. section 360bbb-3(b)(1), unless the authorization is terminated or revoked sooner. Performed at [facility], [place] [address] [place] [address], [place], [place] [address]     Labs: Results for orders placed or performed during the hospital encounter of 11/20/19 (from the past 48 hour(s))  Urinalysis, Routine w reflex microscopic     Status: Abnormal   Collection Time: 11/20/19  5:14 AM  Result Value Ref Range   Color, Urine YELLOW YELLOW   APPearance CLEAR CLEAR   Specific Gravity, Urine >1.046 (H) 1.005 - 1.030   pH 5.0 5.0 - 8.0   Glucose, UA NEGATIVE NEGATIVE mg/dL   Hgb  urine dipstick NEGATIVE NEGATIVE   Bilirubin Urine NEGATIVE NEGATIVE   Ketones, ur 5 (A) NEGATIVE mg/dL   Protein, ur NEGATIVE NEGATIVE mg/dL   Nitrite NEGATIVE NEGATIVE   Leukocytes,Ua NEGATIVE NEGATIVE    Comment: Performed at [facility], [place] [address] [place], [place] [address]  Lipase, blood     Status: None   Collection Time: 11/20/19  5:33 AM  Result Value Ref Range   Lipase 27 11 - 51 U/L    Comment: Performed at [facility], [place] [address], [place], [place] [address]  Comprehensive metabolic panel     Status: Abnormal   Collection Time: 11/20/19  5:33 AM  Result Value Ref Range   Sodium 136 135 - 145 mmol/L   Potassium 3.8 3.5 - 5.1 mmol/L   Chloride 104 98 - 111 mmol/L   CO2 18 (L) 22 - 32 mmol/L   Glucose, Bld 192 (H) 70 - 99 mg/dL    Comment: Glucose reference range applies only to samples taken after fasting for at least 8 hours.   BUN 27 (H) 8 - 23 mg/dL   Creatinine, Ser 1.03 0.61 - 1.24 mg/dL   Calcium 9.9 8.9 - 10.3 mg/dL   Total Protein 7.7 6.5 -  8.1 g/dL   Albumin 4.7 3.5 - 5.0 g/dL   AST 29 15 - 41 U/L   ALT 34 0 - 44 U/L   Alkaline Phosphatase 69 38 - 126 U/L   Total Bilirubin 1.0 0.3 - 1.2 mg/dL   GFR calc non Af Amer >60 >60 mL/min   GFR calc Af Amer >60 >60 mL/min   Anion gap 14 5 - 15    Comment: Performed at [facility], [place] [address] [place], [place] [address]  CBC     Status: Abnormal   Collection Time: 11/20/19  5:33 AM  Result Value Ref Range   WBC 18.5 (H) 4.0 - 10.5 K/uL   RBC 6.32 (H) 4.22 - 5.81 MIL/uL   Hemoglobin 18.2 (H) 13.0 - 17.0 g/dL   HCT 55.7 (H) 39.0 - 52.0 %   MCV 88.1 80.0 - 100.0 fL   MCH 28.8 26.0 - 34.0 pg   MCHC 32.7 30.0 - 36.0 g/dL   RDW 13.3 11.5 - 15.5 %   Platelets 221 150 - 400 K/uL   nRBC 0.0 0.0 - 0.2 %    Comment: Performed at [facility], [place] [address], [place], [place] [address]  SARS CORONAVIRUS 2 (TAT 6-24 HRS)  Nasopharyngeal Nasopharyngeal Swab     Status: None   Collection Time: 11/20/19  7:43 AM   Specimen: Nasopharyngeal Swab  Result Value Ref Range   SARS Coronavirus 2 NEGATIVE NEGATIVE    Comment: (NOTE) SARS-CoV-2 target nucleic acids are NOT DETECTED. The SARS-CoV-2 RNA is generally detectable in upper and lower respiratory specimens during the acute phase of infection. Negative results do not preclude SARS-CoV-2 infection, do not rule out co-infections with other pathogens, and should not be used as the sole basis for treatment or other patient management decisions. Negative results must be combined with clinical observations, patient history, and epidemiological information. The expected result is Negative. Fact Sheet for Patients: SugarRoll.be Fact Sheet for Healthcare Providers: https://www.woods-mathews.com/ This test is not yet approved or cleared by the [place] FDA and  has been authorized for detection and/or diagnosis of SARS-CoV-2 by FDA under an Emergency Use Authorization (EUA). This EUA will remain  in effect (meaning this test can be used) for the duration of the COVID-19 declaration under Section 56 4(b)(1) of the Act, 21 U.S.C. section 360bbb-3(b)(1), unless the authorization is terminated or revoked sooner. Performed at [facility], [place] [address], [place], [place] [address]   Protime-INR     Status: Abnormal   Collection Time: 11/20/19 10:01 AM  Result Value Ref Range   Prothrombin Time 22.1 (H) 11.4 - 15.2 seconds   INR 1.9 (H) 0.8 - 1.2    Comment: (NOTE) INR goal varies based on device and disease states. Performed at [facility], [place] [address] [place], [place] [address]   Comprehensive metabolic panel     Status: Abnormal   Collection Time: 11/21/19  5:02 AM  Result Value Ref Range   Sodium 142 135 - 145 mmol/L   Potassium 3.7 3.5 - 5.1 mmol/L   Chloride 111 98 - 111 mmol/L    CO2 24 22 - 32 mmol/L   Glucose, Bld 84 70 - 99 mg/dL    Comment: Glucose reference range applies only to samples taken after fasting for at least 8 hours.   BUN 23 8 - 23 mg/dL   Creatinine, Ser 0.88 0.61 - 1.24 mg/dL   Calcium 8.3 (L) 8.9 -  10.3 mg/dL   Total Protein 5.7 (L) 6.5 - 8.1 g/dL   Albumin 3.4 (L) 3.5 - 5.0 g/dL   AST 21 15 - 41 U/L   ALT 24 0 - 44 U/L   Alkaline Phosphatase 52 38 - 126 U/L   Total Bilirubin 1.2 0.3 - 1.2 mg/dL   GFR calc non Af Amer >60 >60 mL/min   GFR calc Af Amer >60 >60 mL/min   Anion gap 7 5 - 15    Comment: Performed at [facility], [place] [address], [place], [place] [address]  CBC     Status: None   Collection Time: 11/21/19  5:02 AM  Result Value Ref Range   WBC 7.5 4.0 - 10.5 K/uL   RBC 5.04 4.22 - 5.81 MIL/uL   Hemoglobin 14.4 13.0 - 17.0 g/dL   HCT 45.6 39.0 - 52.0 %   MCV 90.5 80.0 - 100.0 fL   MCH 28.6 26.0 - 34.0 pg   MCHC 31.6 30.0 - 36.0 g/dL   RDW 13.6 11.5 - 15.5 %   Platelets 174 150 - 400 K/uL   nRBC 0.0 0.0 - 0.2 %    Comment: Performed at [facility], [place] [address], [place], [place] [address]  Protime-INR     Status: Abnormal   Collection Time: 11/21/19  5:02 AM  Result Value Ref Range   Prothrombin Time 19.8 (H) 11.4 - 15.2 seconds   INR 1.7 (H) 0.8 - 1.2    Comment: (NOTE) INR goal varies based on device and disease states. Performed at [facility], [place] [address], [place], [place] [address]     Imaging / Studies: CT ABDOMEN PELVIS W CONTRAST  Result Date: 11/20/2019 CLINICAL DATA:  Right lower quadrant pain. Appendicitis suspected. EXAM: CT ABDOMEN AND PELVIS WITH CONTRAST TECHNIQUE: Multidetector CT imaging of the abdomen and pelvis was performed using the standard protocol following bolus administration of intravenous contrast. CONTRAST:  132m OMNIPAQUE IOHEXOL 300 MG/ML  SOLN COMPARISON:  CT of the abdomen and pelvis 07/14/2015 FINDINGS: Lower chest:  Calcified nodule in the right lower lobe is stable. The lungs are otherwise clear. Heart size is normal. Artery calcifications are evident. No significant pleural or pericardial effusion is present. Hepatobiliary: Small up attic cysts are stable. No new lesions are present. Layering stones are present in the gallbladder. Inflammatory changes are present to suggest cholecystitis. The common bile duct is within normal. Pancreas: Unremarkable. No pancreatic ductal dilatation or surrounding inflammatory changes. Spleen: Normal in size without focal abnormality. Adrenals/Urinary Tract: Adrenal glands are normal bilaterally. 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 Multiple nonobstructing stones are present in the left kidney. Largest is at the lower pole of the left kidney measuring up 6 mm. Ureters are within normal limits. Layering stones or posterior bladder calcifications are noted. The urinary bladder is otherwise within normal limits. Stomach/Bowel: Stomach and duodenum are within normal limits. Small bowel is dilated to 2.7 cm. Transition point is present in the right lower quadrant, adjacent to the previous mesh hernia repair. The more distal small bowel is collapsed. Appendix is not discretely visualized and may be surgically absent. No inflammatory changes are present along the ileocolic ligament. The ascending and transverse colon are within normal limits. The descending and sigmoid colon are normal. Vascular/Lymphatic: Atherosclerotic calcifications are present in the aorta and branch vessels without aneurysm. Calcifications are present at the origin of the celiac artery without significant stenosis. 50% narrowing is noted at  the origin of the SMA. Reproductive: The prostate is enlarged, measuring 5.9 cm in transverse diameter. No discrete lesion is present. Other: No abdominal wall hernia or abnormality. No abdominopelvic ascites. Musculoskeletal: Vertebral body heights and alignment are maintained. No  focal lytic or blastic lesions are present. Bony pelvis is within normal limits. The hips are located and within normal limits. IMPRESSION: 1. Small bowel obstruction with transition point in the right lower quadrant adjacent to the previous mesh hernia repair. 2. No significant inflammatory changes are present along the ileocolic ligament to suggest appendicitis. The appendix is not discretely visualized. 3. Multiple nonobstructing stones in the left kidney. 4. Cholelithiasis without cholecystitis. 5. Stable hepatic and renal cysts. 6. Enlarged prostate gland without a discrete lesion. 7. Aortic Atherosclerosis (ICD10-I70.0). Electronically Signed   By: San Morelle M.D.   On: 11/20/2019 07:30   DG Abd Portable 1V-Small Bowel Obstruction Protocol-initial, 8 hr delay  Result Date: 11/21/2019 CLINICAL DATA:  Small bowel protocol, 8 hour delayed film. EXAM: PORTABLE ABDOMEN - 1 VIEW COMPARISON:  CT earlier this day. FINDINGS: Administered enteric contrast in the ascending, transverse, descending and sigmoid colon. Minimal residual contrast within the small bowel. Decreased small bowel distension from prior CT. Nonobstructing stones in the left kidney again seen. No evidence of free air. IMPRESSION: Administered enteric contrast in the ascending, transverse, descending and sigmoid colon. Decreased small bowel distension from prior CT consistent with resolving obstruction. Electronically Signed   By: Keith Rake M.D.   On: 11/21/2019 00:34    Medications / Allergies: per chart  Antibiotics: Anti-infectives (From admission, onward)   None        Note: Portions of this report may have been transcribed using voice recognition software. Every effort was made to ensure accuracy; however, inadvertent computerized transcription errors may be present.   Any transcriptional errors that result from this process are unintentional.     Adin Hector, MD, FACS, MASCRS Gastrointestinal and  Minimally Invasive Surgery    [address] [place], [place] [address] [phone] Main / Paging [phone] Fax Please see Amion for pager number, especial 5pm - 7am.

## 2019-11-21 NOTE — Discharge Summary (Signed)
Physician Discharge Summary  ZIYON SOLTAU BTD:[id number] DOB: 1946-04-24   PCP: [name], MD  Admit date: 11/20/2019 Discharge date: 11/21/2019 Length of Stay: 1 days   Code Status: Full Code  Admitted From:  Home Discharged to:   [place]:  None  Equipment/Devices:  None Discharge Condition:  Stable  Recommendations for Outpatient Follow-up   Please make sure he follows up with GI routinely  Hospital Summary  Francis Campbell  is a 74 y.o. male, with past medical history of a flutter on warfarin, remote Crohn's disease, GERD, kidney stones, hyperlipidemia, hypertension, TIA, presents to ED secondary to planes of abdominal pain and nausea, patient reports he has been in his usual state of health, started to have nausea, colicky abdominal pain day prior to admission, has been progressive, intermittent, reported nausea, no vomiting, he reported not passing gas only one small episode of bowel movement overnight, denies fever, chills, dysuria, diarrhea, no coffee-ground emesis or bright blood per rectum, reports he got his second dose of vaccine dose last Tuesday.  In the ED patient abdomen was noted to be distended, CT abdomen pelvis significant for SBO, work-up significant for leukocytosis at 18 K, as well elevated hemoglobin at 18.2, and it has been stable at 1.03, with elevated glucose level of 192, general surgery was consulted and patient was admitted to medical service.  On admission, there was significant difficulty with obtaining NG tube placement after multiple attempts.  Warfarin was held in case of surgical intervention.   Patient was able to tolerate p.o. contrast and went for abdominal x-ray a.m. of 3/6 which showed improvement of bowel obstruction.  Diet was advanced to dysphagia 1.  Leukocytosis had resolved.  Patient tolerated diet well and began having formed bowel movements.  He was able to tolerate a sandwich without any recurrent complaints.  He was  ambulating well.  Though initially general surgery had recommended patient stay an additional night, hospitalist reached back out to general surgery and discussed with PA in the afternoon of 3/6 and surgery cleared patient for discharge.  A & P   Principal Problem:   SBO (small bowel obstruction) (HCC) Active Problems:   Leucocytosis   Abdominal pain   Crohn disease (HCC)   Anticoagulated on Coumadin   AF (atrial fibrillation) (HCC)   Benign hypertensive heart disease without heart failure   Irregular bowel habits   1. SBO 1. Initially n.p.o.. NG tube was not placed due to difficulty.   2. Did not require surgical intervention 3. Leukocytosis resolved 4. Tolerated p.o. intake and ambulation and had formed BMs 2. Paroxysmal atrial fibrillation 1. Continue home Toprol, Tikosyn, Coumadin 3. Hypertension 1. Continue home meds 4. Hyperlipidemia 1. Restart statin at discharge (held initially due to n.p.o. status) 5. Polycythemia, resolved 1. Follow-up outpatient  Consultants  . General surgery  Procedures  . None  Antibiotics   Anti-infectives (From admission, onward)   None       Subjective  Patient seen and examined at bedside no acute distress and resting comfortably.  No events overnight.  Tolerating diet. In good spirits and anticipating discharge.  Eating well.  Tolerating ambulation  Denies any chest pain, shortness of breath, fever, nausea, vomiting, urinary or bowel complaints. Otherwise ROS negative    Objective   Discharge Exam: Vitals:   11/21/19 0803 11/21/19 1225  BP: (!) 156/81 (!) 147/75  Pulse: 65 62  Resp:  20  Temp: 98.9 F (37.2 C) 97.9 F (36.6  C)  SpO2: 97% 97%   Vitals:   11/20/19 2041 11/21/19 0458 11/21/19 0803 11/21/19 1225  BP: (!) 144/80 (!) 141/89 (!) 156/81 (!) 147/75  Pulse: 71 72 65 62  Resp: (!) 22 16  20   Temp: 98 F (36.7 C) 97.6 F (36.4 C) 98.9 F (37.2 C) 97.9 F (36.6 C)  TempSrc: Oral Oral Oral Oral  SpO2: 98%  97% 97% 97%  Weight:      Height:        Physical Exam Vitals and nursing note reviewed.  Constitutional:      Appearance: Normal appearance.  HENT:     Head: Normocephalic and atraumatic.  Eyes:     Conjunctiva/sclera: Conjunctivae normal.  Cardiovascular:     Rate and Rhythm: Normal rate and regular rhythm.  Pulmonary:     Effort: Pulmonary effort is normal.     Breath sounds: Normal breath sounds.  Abdominal:     General: Abdomen is flat.     Palpations: Abdomen is soft.  Musculoskeletal:        General: No swelling or tenderness.  Skin:    Coloration: Skin is not jaundiced or pale.  Neurological:     Mental Status: He is alert. Mental status is at baseline.  Psychiatric:        Mood and Affect: Mood normal.        Behavior: Behavior normal.       The results of significant diagnostics from this hospitalization (including imaging, microbiology, ancillary and laboratory) are listed below for reference.     Microbiology: Recent Results (from the past 240 hour(s))  SARS CORONAVIRUS 2 (TAT 6-24 HRS) Nasopharyngeal Nasopharyngeal Swab     Status: None   Collection Time: 11/20/19  7:43 AM   Specimen: Nasopharyngeal Swab  Result Value Ref Range Status   SARS Coronavirus 2 NEGATIVE NEGATIVE Final    Comment: (NOTE) SARS-CoV-2 target nucleic acids are NOT DETECTED. The SARS-CoV-2 RNA is generally detectable in upper and lower respiratory specimens during the acute phase of infection. Negative results do not preclude SARS-CoV-2 infection, do not rule out co-infections with other pathogens, and should not be used as the sole basis for treatment or other patient management decisions. Negative results must be combined with clinical observations, patient history, and epidemiological information. The expected result is Negative. Fact Sheet for Patients: SugarRoll.be Fact Sheet for Healthcare  Providers: https://www.woods-mathews.com/ This test is not yet approved or cleared by the [place] FDA and  has been authorized for detection and/or diagnosis of SARS-CoV-2 by FDA under an Emergency Use Authorization (EUA). This EUA will remain  in effect (meaning this test can be used) for the duration of the COVID-19 declaration under Section 56 4(b)(1) of the Act, 21 U.S.C. section 360bbb-3(b)(1), unless the authorization is terminated or revoked sooner. Performed at [facility], [place] [address] [place], [place] [address]      Labs: BNP (last 3 results) No results for input(s): BNP in the last 8760 hours. Basic Metabolic Panel: Recent Labs  Lab 11/20/19 0533 11/21/19 0502  NA 136 142  K 3.8 3.7  CL 104 111  CO2 18* 24  GLUCOSE 192* 84  BUN 27* 23  CREATININE 1.03 0.88  CALCIUM 9.9 8.3*   Liver Function Tests: Recent Labs  Lab 11/20/19 0533 11/21/19 0502  AST 29 21  ALT 34 24  ALKPHOS 69 52  BILITOT 1.0 1.2  PROT 7.7 5.7*  ALBUMIN 4.7 3.4*   Recent Labs  Lab 11/20/19 0533  LIPASE 27   No results for input(s): AMMONIA in the last 168 hours. CBC: Recent Labs  Lab 11/20/19 0533 11/21/19 0502  WBC 18.5* 7.5  HGB 18.2* 14.4  HCT 55.7* 45.6  MCV 88.1 90.5  PLT 221 174   Cardiac Enzymes: No results for input(s): CKTOTAL, CKMB, CKMBINDEX, TROPONINI in the last 168 hours. BNP: Invalid input(s): POCBNP CBG: No results for input(s): GLUCAP in the last 168 hours. D-Dimer No results for input(s): DDIMER in the last 72 hours. Hgb A1c No results for input(s): HGBA1C in the last 72 hours. Lipid Profile No results for input(s): CHOL, HDL, LDLCALC, TRIG, CHOLHDL, LDLDIRECT in the last 72 hours. Thyroid function studies No results for input(s): TSH, T4TOTAL, T3FREE, THYROIDAB in the last 72 hours.  Invalid input(s): FREET3 Anemia work up No results for input(s): VITAMINB12, FOLATE, FERRITIN, TIBC, IRON, RETICCTPCT in the last 72  hours. Urinalysis    Component Value Date/Time   COLORURINE YELLOW 11/20/2019 0514   APPEARANCEUR CLEAR 11/20/2019 0514   LABSPEC >1.046 (H) 11/20/2019 0514   PHURINE 5.0 11/20/2019 0514   GLUCOSEU NEGATIVE 11/20/2019 0514   HGBUR NEGATIVE 11/20/2019 0514   BILIRUBINUR NEGATIVE 11/20/2019 0514   BILIRUBINUR neg 12/30/2017 1143   KETONESUR 5 (A) 11/20/2019 0514   PROTEINUR NEGATIVE 11/20/2019 0514   UROBILINOGEN 0.2 12/30/2017 1143   UROBILINOGEN 0.2 02/07/2013 2007   NITRITE NEGATIVE 11/20/2019 0514   LEUKOCYTESUR NEGATIVE 11/20/2019 0514   Sepsis Labs Invalid input(s): PROCALCITONIN,  WBC,  LACTICIDVEN Microbiology Recent Results (from the past 240 hour(s))  SARS CORONAVIRUS 2 (TAT 6-24 HRS) Nasopharyngeal Nasopharyngeal Swab     Status: None   Collection Time: 11/20/19  7:43 AM   Specimen: Nasopharyngeal Swab  Result Value Ref Range Status   SARS Coronavirus 2 NEGATIVE NEGATIVE Final    Comment: (NOTE) SARS-CoV-2 target nucleic acids are NOT DETECTED. The SARS-CoV-2 RNA is generally detectable in upper and lower respiratory specimens during the acute phase of infection. Negative results do not preclude SARS-CoV-2 infection, do not rule out co-infections with other pathogens, and should not be used as the sole basis for treatment or other patient management decisions. Negative results must be combined with clinical observations, patient history, and epidemiological information. The expected result is Negative. Fact Sheet for Patients: SugarRoll.be Fact Sheet for Healthcare Providers: https://www.woods-mathews.com/ This test is not yet approved or cleared by the [place] FDA and  has been authorized for detection and/or diagnosis of SARS-CoV-2 by FDA under an Emergency Use Authorization (EUA). This EUA will remain  in effect (meaning this test can be used) for the duration of the COVID-19 declaration under Section 56 4(b)(1)  of the Act, 21 U.S.C. section 360bbb-3(b)(1), unless the authorization is terminated or revoked sooner. Performed at [facility], [place] [address] [place],  [address]     Discharge Instructions     Discharge Instructions    Advance diet as tolerated   Complete by: As directed    Advance from: DIET - DYS 1   Advance to: Diet heart healthy   Fluid consistency: Thin   Discharge instructions   Complete by: As directed    You were seen and examined in the hospital for bowel obstruction and cared for by a hospitalist.   Upon Discharge:  -Increase your diet as tolerated -If you have recurrent or worsening symptoms please contact your primary care physician or return to the ED  Read the complete instructions along with all  the possible side effects for all the medicines you take and that have been prescribed to you. Take any new medicines after you have completely understood and accept all the possible adverse reactions/side effects.   If you have any questions about your discharge medications or the care you received while you were in the hospital, you can call the unit and asked to speak with the hospitalist on call. Once you are discharged, your primary care physician will handle any further medical issues. Please note that NO REFILLS for any discharge medications will be authorized, as it is imperative that you return to your primary care physician (or establish a relationship with a primary care physician if you do not have one) for your aftercare needs so that they can reassess your need for medications and monitor your lab values.   Do not drive, operate heavy machinery, perform activities at heights, swimming or participation in water activities or provide baby sitting services if your were admitted for loss of consciousness/seizures or if you are on sedating medications including, but not limited to benzodiazepines, sleep medications, narcotic pain medications, etc.,  until you have been cleared to do so by a medical doctor.   Do not take more than prescribed medications.   Wear a seat belt while driving.  If you have smoked or chewed Tobacco in the last 2 years please stop smoking; also stop any regular Alcohol and/or any Recreational drug use including marijuana.  If you experience worsening of your admission symptoms or develop shortness of breath, chest pain, suicidal or homicidal thoughts or experience a life threatening emergency, you must seek medical attention immediately by calling 911 or calling your PCP immediately.   Increase activity slowly   Complete by: As directed      Allergies as of 11/21/2019      Reactions   Chocolate Palpitations   Chocolate Flavor Shortness Of Breath   Other reaction(s): Respiratory Distress   Dextroamphetamine Shortness Of Breath   Other reaction(s): Respiratory Distress   Dextromethorphan Palpitations   Flomax [tamsulosin Hcl] Other (See Comments)   ? Causes A Fib   Floxin [ofloxacin] Other (See Comments)   hallucinations   Guaifenesin Shortness Of Breath   Lisinopril Cough   Tavist Allergy [clemastine Fumarate] Other (See Comments)   A fib worse   Clemastine    Other reaction(s): Irregular Heart Rate   Robitussin Cold+flu Daytime  [dm-phenylephrine-acetaminophen]    Other reaction(s): Respiratory Distress   Tamsulosin Other (See Comments)   Other reaction(s): Irregular Heart Rate   Amiodarone Other (See Comments)   Abn  thyroid   Robitussin Cf [pseudoephedrine-dm-gg] Palpitations      Medication List    TAKE these medications   acetaminophen 325 MG tablet Commonly known as: TYLENOL Take 325 mg by mouth every 6 (six) hours as needed for mild pain or headache.   ALPRAZolam 0.25 MG tablet Commonly known as: XANAX TAKE 1 TABLET BY MOUTH AT BEDTIME AS NEEDED FOR ANXIETY What changed: See the new instructions.   diltiazem 240 MG 24 hr capsule Commonly known as: Cartia XT Take 240 mg by mouth  in the morning   flecainide 100 MG tablet Commonly known as: TAMBOCOR Take 1 tablet (100 mg total) by mouth 2 (two) times daily.   losartan 25 MG tablet Commonly known as: COZAAR TAKE 1 TABLET EVERY DAY   metoprolol succinate 25 MG 24 hr tablet Commonly known as: TOPROL-XL Take 1 tablet (25 mg total) by mouth daily. Take with  or immediately following a meal.   multivitamin tablet Take 1 tablet by mouth daily.   potassium citrate 10 MEQ (1080 MG) SR tablet Commonly known as: UROCIT-K Take 10 mEq by mouth in the morning and at bedtime.   rosuvastatin 20 MG tablet Commonly known as: CRESTOR Take 1 tablet (20 mg total) by mouth daily. TAKE 1 TABLET EVERY DAY   sertraline 50 MG tablet Commonly known as: ZOLOFT Take 1.5 tablets (75 mg total) by mouth daily. What changed: how much to take   Turmeric 500 MG Caps Take 500 mg by mouth daily.   Vitamin D3 25 MCG (1000 UT) Caps Take 2,000 Units by mouth every morning.   warfarin 5 MG tablet Commonly known as: COUMADIN Take as directed. If you are unsure how to take this medication, talk to your nurse or doctor. Original instructions: TAKE 1 TO 2 TABLETS DAILY AS  DIRECTED BY COUMADIN CLINIC What changed:   how much to take  how to take this  when to take this  additional instructions       Allergies  Allergen Reactions  . Chocolate Palpitations  . Chocolate Flavor Shortness Of Breath    Other reaction(s): Respiratory Distress  . Dextroamphetamine Shortness Of Breath    Other reaction(s): Respiratory Distress  . Dextromethorphan Palpitations  . Flomax [Tamsulosin Hcl] Other (See Comments)    ? Causes A Fib  . Floxin [Ofloxacin] Other (See Comments)    hallucinations  . Guaifenesin Shortness Of Breath  . Lisinopril Cough  . Tavist Allergy [Clemastine Fumarate] Other (See Comments)    A fib worse  . Clemastine     Other reaction(s): Irregular Heart Rate  . Robitussin Cold+Flu Daytime   [Dm-Phenylephrine-Acetaminophen]     Other reaction(s): Respiratory Distress  . Tamsulosin Other (See Comments)    Other reaction(s): Irregular Heart Rate  . Amiodarone Other (See Comments)    Abn  thyroid  . Robitussin Cf [Pseudoephedrine-Dm-Gg] Palpitations    Time coordinating discharge: Over 30 minutes   SIGNED:   Harold Hedge, D.O. [facility] Pager: [phone]  11/21/2019, 2:28 PM

## 2019-11-21 NOTE — Care Management Obs Status (Signed)
[facility] NOTIFICATION   Patient Details  Name: Francis Campbell MRN: [id number] Date of Birth: 1946/07/14   Medicare Observation Status Notification Given:  Yes    Joaquin Courts, RN 11/21/2019, 3:08 PM

## 2019-11-25 DIAGNOSIS — H2513 Age-related nuclear cataract, bilateral: Secondary | ICD-10-CM | POA: Diagnosis not present

## 2019-11-25 DIAGNOSIS — H25813 Combined forms of age-related cataract, bilateral: Secondary | ICD-10-CM | POA: Diagnosis not present

## 2019-12-01 ENCOUNTER — Other Ambulatory Visit: Payer: Self-pay

## 2019-12-01 ENCOUNTER — Ambulatory Visit (INDEPENDENT_AMBULATORY_CARE_PROVIDER_SITE_OTHER): Payer: Medicare Other | Admitting: Gastroenterology

## 2019-12-01 ENCOUNTER — Encounter: Payer: Self-pay | Admitting: Gastroenterology

## 2019-12-01 VITALS — BP 132/70 | HR 51 | Temp 97.7°F | Ht 73.0 in | Wt 188.0 lb

## 2019-12-01 DIAGNOSIS — K56609 Unspecified intestinal obstruction, unspecified as to partial versus complete obstruction: Secondary | ICD-10-CM

## 2019-12-01 NOTE — Progress Notes (Signed)
Review of pertinent gastrointestinal problems: 1.  Heartburn, dyspepsia led to EGD March 2020 which showed mild gastritis.  Biopsies showed no sign of H. Pylori and he was recommended to continue once daily antiacid medicine, avoid caffeinated and carbonated beverages. 2.  Routine risk for colon cancer: Colonoscopy February 2013 Dr. Verl Blalock routine risk screening was normal.  He recommended repeat colonoscopy for cancer screening in 10 years   HPI: This is a very pleasant 74 year old man whom I last saw him 3 months ago here in the office to discuss alternating constipation and diarrhea.  I recommended a trial of over-the-counter fiber supplements and he was to call back about 4 weeks afterwards.  He sent a message about 4 weeks later saying that the fiber supplements working perfectly.  He was hospitalized briefly for small bowel obstruction, white blood cell count 18,000.  CT scan showed clear small bowel obstruction with a an acute transition point in the right lower quadrant "adjacent to the previous mesh hernia repair."  Small bowel distal to this was nondilated.  NG tube was attempted to be placed several times without success.  He ended up passing the CT contrast and having significant diarrhea and flatus shortly after the scan.  I believe he only spent 24 hours in the hospital.  ROS: complete GI ROS as described in HPI, all other review negative.  Constitutional:  No unintentional weight loss   Past Medical History:  Diagnosis Date  . Allergy   . Aortic regurgitation   . Arthritis   . Asthma    only as child  . Atrial flutter ([place])    a. Dx 09/2012.  . Crohn disease ([place])    Remotely - in the 1990s  . Crohn's colitis ([place]) 11/20/1984  . Esophageal reflux    Remotely  . History of kidney stones   . Hypercholesteremia   . Hypertension   . Paroxysmal atrial fibrillation (HCC)    Takes PRN flecainide  . 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  [name]  TIA (transient ischemic attack)    ~2005 - had visual symptoms, was told by [name] this may have been a TIA but it's unclear  . Valvular heart disease    Mild AI, ASclerosis without AS by echo 10/2011    Past Surgical History:  Procedure Laterality Date  . CARDIOVERSION N/A 05/08/2013   Procedure: CARDIOVERSION;  Surgeon: Darlin Coco, MD;  Location[facility] ENDOSCOPY;  Service: Cardiovascular;  Laterality: N/A;  . CYSTOSCOPY/RETROGRADE/URETEROSCOPY/STONE EXTRACTION WITH BASKET Right 02/12/2013   Procedure: CYSTOSCOPY/RETROGRADE/URETEROSCOPY/STONE EXTRACTION WITH BASKET, WITH  STENT PLACEMENT RIGHT URETERAL DILATION;  Surgeon: [name], MD;  Location: [facility] ORS;  Service: Urology;  Laterality: Right;  . EYE SURGERY Bilateral 1950, 1960, 1964   left, right, left  . HERNIA REPAIR  2005  . KNEE SURGERY Bilateral 1990, 2000  . LITHOTRIPSY    . ROTATOR CUFF REPAIR Left 2000  . TONSILLECTOMY      Current Outpatient Medications  Medication Sig Dispense Refill  . acetaminophen (TYLENOL) 325 MG tablet Take 325 mg by mouth every 6 (six) hours as needed for mild pain or headache.    . ALPRAZolam (XANAX) 0.25 MG tablet TAKE 1 TABLET BY MOUTH AT BEDTIME AS NEEDED FOR ANXIETY (Patient taking differently: Take 0.25 mg by mouth at bedtime as needed for anxiety or sleep. TAKE 1 TABLET BY MOUTH AT BEDTIME AS NEEDED FOR ANXIETY) 30 tablet 3  . Cholecalciferol (VITAMIN D3)  1000 UNITS CAPS Take 2,000 Units by mouth every morning.     . diltiazem (CARTIA XT) 240 MG 24 hr capsule Take 240 mg by mouth in the morning 90 capsule 3  . flecainide (TAMBOCOR) 100 MG tablet Take 1 tablet (100 mg total) by mouth 2 (two) times daily. 180 tablet 2  . losartan (COZAAR) 25 MG tablet TAKE 1 TABLET EVERY DAY (Patient taking differently: Take 25 mg by mouth daily. ) 90 tablet 1  . metoprolol succinate (TOPROL-XL) 25 MG 24 hr tablet Take 1 tablet (25 mg total) by mouth daily. Take with or immediately following a meal. 90  tablet 3  . Multiple Vitamin (MULTIVITAMIN) tablet Take 1 tablet by mouth daily.    . potassium citrate (UROCIT-K) 10 MEQ (1080 MG) SR tablet Take 10 mEq by mouth in the morning and at bedtime.   11  . rosuvastatin (CRESTOR) 20 MG tablet Take 1 tablet (20 mg total) by mouth daily. TAKE 1 TABLET EVERY DAY 90 tablet 3  . sertraline (ZOLOFT) 50 MG tablet Take 1.5 tablets (75 mg total) by mouth daily. (Patient taking differently: Take 50 mg by mouth daily. ) 135 tablet 0  . Turmeric 500 MG CAPS Take 500 mg by mouth daily.    [name] warfarin (COUMADIN) 5 MG tablet TAKE 1 TO 2 TABLETS DAILY AS  DIRECTED BY COUMADIN CLINIC (Patient taking differently: Take 5-7.5 mg by mouth See admin instructions. Mon, Wed, Friday 7.59m Tuesday, Thursday, Saturday, Sunday 519m 180 tablet 0   No current facility-administered medications for this visit.    Allergies as of 12/01/2019 - Review Complete 12/01/2019  Allergen Reaction Noted  . Chocolate Palpitations 09/15/2012  . Chocolate flavor Shortness Of Breath 08/29/2018  . Dextroamphetamine Shortness Of Breath 08/29/2018  . Dextromethorphan Palpitations 10/03/2012  . Flomax [tamsulosin hcl] Other (See Comments) 10/17/2012  . Floxin [ofloxacin] Other (See Comments) 08/08/2011  . Guaifenesin Shortness Of Breath 08/29/2018  . Lisinopril Cough 12/07/2010  . Tavist allergy [clemastine fumarate] Other (See Comments) 12/07/2010  . Clemastine  11/20/2019  . Robitussin cold+flu daytime  [dm-phenylephrine-acetaminophen]  11/20/2019  . Tamsulosin Other (See Comments) 08/01/2018  . Amiodarone Other (See Comments) 12/07/2010  . Robitussin cf [pseudoephedrine-dm-gg] Palpitations 09/09/2011    Family History  Problem Relation Age of Onset  . Stroke Mother   . Hypertension Mother   . Kidney disease Mother        stones  . Nephrolithiasis Brother   . Arthritis Paternal Grandmother   . Heart disease Paternal Grandmother   . Leukemia Father   . Heart disease Father   .  Nephrolithiasis Father   . Kidney cancer Son   . Appendicitis Son   . Heart attack Neg Hx   . Colon cancer Neg Hx   . Esophageal cancer Neg Hx   . Pancreatic cancer Neg Hx   . Stomach cancer Neg Hx   . Liver disease Neg Hx     Social History   Socioeconomic History  . Marital status: Married    Spouse name: Not on file  . Number of children: 2  . Years of education: Not on file  . Highest education level: Not on file  Occupational History  . Occupation: TECHNICAL SUP    Employer: [email or web address]  Tobacco Use  . Smoking status: Never Smoker  . Smokeless tobacco: Never Used  Substance and Sexual Activity  . Alcohol use: No  . Drug use: No  . Sexual activity: Yes  Birth control/protection: None  Other Topics Concern  . Not on file  Social History Narrative   Non smoker  No ets          Social Determinants of Health   Financial Resource Strain:   . Difficulty of Paying Living Expenses:   Food Insecurity:   . Worried About Charity fundraiser in the Last Year:   . Arboriculturist in the Last Year:   Transportation Needs:   . Film/video editor (Medical):   [name] Lack of Transportation (Non-Medical):   Physical Activity:   . Days of Exercise per Week:   . Minutes of Exercise per Session:   Stress:   . Feeling of Stress :   Social Connections:   . Frequency of Communication with Friends and Family:   . Frequency of Social Gatherings with Friends and Family:   . Attends Religious Services:   . Active Member of Clubs or Organizations:   . Attends Archivist Meetings:   [name] Marital Status:   Intimate Partner Violence:   . Fear of Current or Ex-Partner:   . Emotionally Abused:   [name] Physically Abused:   . Sexually Abused:      Physical Exam: BP 132/70   Pulse (!) 51   Temp 97.7 F (36.5 C)   Ht 6' 1"  (1.854 m)   Wt 188 lb (85.3 kg)   BMI 24.80 kg/m  Constitutional: generally well-appearing Psychiatric: alert and oriented x3 Abdomen: soft, nontender,  nondistended, no obvious ascites, no peritoneal signs, normal bowel sounds No peripheral edema noted in lower extremities  Assessment and plan: 74 y.o. male with recent small bowel obstruction  This is almost certainly adhesive related, perhaps the nearby mesh from his 74 year old remote hernia repair surgery played a role.  He has never had small bowel obstruction prior to this.  This will resolve quite quickly.  I recommended he stay on his fiber supplements keep his bowels moving easily.  He knows to call if he has recurrent symptoms of a bowel obstruction or present to the emergency room.  We discussed this remote history of Crohn's disease that is in his chart.  He tells me he saw Dr. Sharlett Iles about 30 years ago with some constipation and he was told he had Crohn's disease.  1 month later he was feeling much better return for follow-up he was told he actually did not have Crohn's disease.  I think it is highly improbable that he has ever had Crohn's disease.   Please see the "Patient Instructions" section for addition details about the plan.  Owens Loffler, MD [facility] 12/01/2019, 3:45 PM   Total time on date of encounter was  20 minutes (this included time spent preparing to see the patient reviewing records; obtaining and/or reviewing separately obtained history; performing a medically appropriate exam and/or evaluation; counseling and educating the patient and family if present; ordering medications, tests or procedures if applicable; and documenting clinical information in the health record).

## 2019-12-01 NOTE — Patient Instructions (Signed)
Continue daily fiber supplement.   Follow up as needed.

## 2019-12-09 ENCOUNTER — Other Ambulatory Visit: Payer: Self-pay

## 2019-12-09 ENCOUNTER — Ambulatory Visit (INDEPENDENT_AMBULATORY_CARE_PROVIDER_SITE_OTHER): Payer: Medicare Other | Admitting: Pharmacist Clinician (PhC)/ Clinical Pharmacy Specialist

## 2019-12-09 DIAGNOSIS — I480 Paroxysmal atrial fibrillation: Secondary | ICD-10-CM | POA: Diagnosis not present

## 2019-12-09 DIAGNOSIS — Z5181 Encounter for therapeutic drug level monitoring: Secondary | ICD-10-CM | POA: Diagnosis not present

## 2019-12-09 LAB — POCT INR: INR: 2.2 (ref 2.0–3.0)

## 2019-12-15 DIAGNOSIS — H18413 Arcus senilis, bilateral: Secondary | ICD-10-CM | POA: Diagnosis not present

## 2019-12-15 DIAGNOSIS — H25043 Posterior subcapsular polar age-related cataract, bilateral: Secondary | ICD-10-CM | POA: Diagnosis not present

## 2019-12-15 DIAGNOSIS — H2511 Age-related nuclear cataract, right eye: Secondary | ICD-10-CM | POA: Diagnosis not present

## 2019-12-15 DIAGNOSIS — H2513 Age-related nuclear cataract, bilateral: Secondary | ICD-10-CM | POA: Diagnosis not present

## 2019-12-15 DIAGNOSIS — H25013 Cortical age-related cataract, bilateral: Secondary | ICD-10-CM | POA: Diagnosis not present

## 2019-12-20 ENCOUNTER — Other Ambulatory Visit: Payer: Self-pay | Admitting: Family Medicine

## 2019-12-21 NOTE — Telephone Encounter (Signed)
Last ov:01/21/2019 Last filled:08/17/2019

## 2019-12-28 ENCOUNTER — Other Ambulatory Visit: Payer: Self-pay

## 2019-12-28 MED ORDER — SERTRALINE HCL 50 MG PO TABS: 75.0000 mg | ORAL_TABLET | Freq: Every day | ORAL | 0 refills | Status: DC

## 2019-12-28 MED ORDER — DILTIAZEM HCL ER COATED BEADS 240 MG PO CP24: ORAL_CAPSULE | ORAL | 3 refills | Status: DC

## 2019-12-30 ENCOUNTER — Other Ambulatory Visit: Payer: Self-pay

## 2019-12-31 ENCOUNTER — Other Ambulatory Visit: Payer: Self-pay

## 2019-12-31 MED ORDER — DILTIAZEM HCL ER COATED BEADS 240 MG PO CP24: ORAL_CAPSULE | ORAL | 3 refills | Status: DC

## 2020-01-01 ENCOUNTER — Other Ambulatory Visit: Payer: Self-pay

## 2020-01-01 DIAGNOSIS — H2512 Age-related nuclear cataract, left eye: Secondary | ICD-10-CM | POA: Diagnosis not present

## 2020-01-01 DIAGNOSIS — H2511 Age-related nuclear cataract, right eye: Secondary | ICD-10-CM | POA: Diagnosis not present

## 2020-01-06 ENCOUNTER — Encounter: Payer: Self-pay | Admitting: Nurse Practitioner

## 2020-01-06 ENCOUNTER — Ambulatory Visit (INDEPENDENT_AMBULATORY_CARE_PROVIDER_SITE_OTHER): Payer: Medicare Other | Admitting: Nurse Practitioner

## 2020-01-06 VITALS — BP 124/66 | HR 59 | Temp 97.4°F | Ht 73.0 in | Wt 190.0 lb

## 2020-01-06 DIAGNOSIS — M7918 Myalgia, other site: Secondary | ICD-10-CM

## 2020-01-06 NOTE — Patient Instructions (Signed)
If you are age 73 or older, your body mass index should be between 23-30. Your Body mass index is 25.07 kg/m. If this is out of the aforementioned range listed, please consider follow up with your Primary Care Provider.  If you are age 50 or younger, your body mass index should be between 19-25. Your Body mass index is 25.07 kg/m. If this is out of the aformentioned range listed, please consider follow up with your Primary Care Provider.   Call the office to follow up as needed.

## 2020-01-06 NOTE — Progress Notes (Signed)
I agree with the above note, plan 

## 2020-01-06 NOTE — Progress Notes (Signed)
IMPRESSION and PLAN:   74 year old male with PMH significant for hypertension, PAF, TIA, arthritis 74 year old male with PMH significant for hypertension, PAF, TIA, arthritis  # RLQ discomfort.   --Though this is the the area where he had a SBO in March, symptoms are different and seen clearly musculoskeletal.  Discomfort is transient occurring only with position changes --Reassurance provided.  Patient knows to call our office ASAP to get imaging study should he develop worsening pain (especially that that is not positional),  bowel changes, nausea vomiting or abdominal distention  #Colorectal cancer screening. --Due for 10-year colonoscopy February 2023 HPI:    Primary GI: Dr. Ardis Hughs  Chief complaint : RLQ discomfort  Francis Campbell is a 74 year old male who I saw a couple of times for abdominal pain.  I saw him in 2019 for localized LLQ pain felt to be musculoskeletal.  I saw him in March 2020 for upper abdominal pain.  This was followed by EGD revealing of gastritis but these were negative.  Patient was seen again December 2020,  that time by Dr. Ardis Hughs, for altered bowel habits.  He was started on Citrucel and 4 weeks later called to report that his bowel movements were back on track.  Dr. Ardis Hughs saw him again 12/01/2019, at that time for hospital follow-up following SBO.  CT scan at that time showed SBO with an acute transition point in the RLQ adjacent to the previous mesh hernia repair.  Patient says the contrast for the CT scan caused a bowel purge, he was out of the hospital within a day or so.  His white count which was elevated to 18 K on admission, returned to normal at 7.5 the following day.   Francis Campbell comes in today to talk about minor discomfort in the RLQ since hospital discharge.  The discomfort is related to activity/position changes.  After sitting for prolonged period of time he will have transient discomfort in the RLQ.  As soon as he starts to walk around the discomfort  resolved.  He has the transient discomfort when bending over.  No abdominal distention.  No nausea/vomiting.  His bowel movements are still moving well on fiber.  His appetite is good.    Review of systems:     No chest pain, no SOB, no fevers, no urinary sx   Past Medical History:  Diagnosis Date  . Allergy   . Aortic regurgitation   . Arthritis   . Asthma    only as child  . Atrial flutter ([place])    a. Dx 09/2012.  . Crohn disease ([place])    Remotely - in the 1990s  . Crohn's colitis ([place]) 11/20/1984  . Esophageal reflux    Remotely  . History of kidney stones   . Hypercholesteremia   . Hypertension   . Paroxysmal atrial fibrillation (HCC)    Takes PRN flecainide  . 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  [name] TIA (transient ischemic attack)    ~2005 - had visual symptoms, was told by [name] this may have been a TIA but it's unclear  . Valvular heart disease    Mild AI, ASclerosis without AS by echo 10/2011    Patient's surgical history, family medical history, social history, medications and allergies were all reviewed in Epic   Creatinine clearance cannot be calculated (Patient's most recent lab result is older than the maximum 21 days allowed.)  Current Outpatient  Medications  Medication Sig Dispense Refill  . acetaminophen (TYLENOL) 325 MG tablet Take 325 mg by mouth every 6 (six) hours as needed for mild pain or headache.    . ALPRAZolam (XANAX) 0.25 MG tablet Take 1 tablet (0.25 mg total) by mouth at bedtime as needed for anxiety or sleep. TAKE 1 TABLET BY MOUTH AT BEDTIME AS NEEDED FOR ANXIETY 30 tablet 3  . Cholecalciferol (VITAMIN D3) 1000 UNITS CAPS Take 2,000 Units by mouth every morning.     . diltiazem (CARTIA XT) 240 MG 24 hr capsule Take 240 mg by mouth in the morning 90 capsule 3  . flecainide (TAMBOCOR) 100 MG tablet Take 1 tablet (100 mg total) by mouth 2 (two) times daily. 180 tablet 2  . gatifloxacin (ZYMAXID) 0.5 % SOLN Place 1  drop into the right eye 4 (four) times daily.    [name] losartan (COZAAR) 25 MG tablet TAKE 1 TABLET EVERY DAY (Patient taking differently: Take 25 mg by mouth daily. ) 90 tablet 1  . metoprolol succinate (TOPROL-XL) 25 MG 24 hr tablet Take 1 tablet (25 mg total) by mouth daily. Take with or immediately following a meal. 90 tablet 3  . Multiple Vitamin (MULTIVITAMIN) tablet Take 1 tablet by mouth daily.    . potassium citrate (UROCIT-K) 10 MEQ (1080 MG) SR tablet Take 10 mEq by mouth in the morning and at bedtime.   11  . PROLENSA 0.07 % SOLN Place 1 drop into the right eye at bedtime.    . rosuvastatin (CRESTOR) 20 MG tablet Take 1 tablet (20 mg total) by mouth daily. TAKE 1 TABLET EVERY DAY 90 tablet 3  . sertraline (ZOLOFT) 50 MG tablet Take 1.5 tablets (75 mg total) by mouth daily. NEEDS OFFICE ViSIT FOR FURTHER REFILLS 135 tablet 0  . tobramycin (TOBREX) 0.3 % ophthalmic solution Place 1 drop into the right eye 4 (four) times daily.     . Turmeric 500 MG CAPS Take 500 mg by mouth daily.    [name] warfarin (COUMADIN) 5 MG tablet TAKE 1 TO 2 TABLETS DAILY AS  DIRECTED BY COUMADIN CLINIC (Patient taking differently: Take 5-7.5 mg by mouth See admin instructions. Mon, Wed, Friday 7.58m Tuesday, Thursday, Saturday, Sunday 589m 180 tablet 0   No current facility-administered medications for this visit.    Physical Exam:     BP 124/66   Pulse (!) 59   Temp (!) 97.4 F (36.3 C)   Ht 6' 1"  (1.854 m)   Wt 190 lb (86.2 kg)   BMI 25.07 kg/m   GENERAL:  Pleasant male in NAD PSYCH: : Cooperative, normal affect CARDIAC:  RRR,  no peripheral edema PULM: Normal respiratory effort, lungs CTA bilaterally, no wheezing ABDOMEN:  Nondistended, soft, nontender. No obvious masses, no hepatomegaly,  normal bowel sounds SKIN:  turgor, no lesions seen Musculoskeletal:  Normal muscle tone, normal strength NEURO: Alert and oriented x 3, no focal neurologic deficits   PaTye Savoy NP 01/06/2020, 10:03 AM

## 2020-01-08 ENCOUNTER — Other Ambulatory Visit: Payer: Self-pay

## 2020-01-08 ENCOUNTER — Ambulatory Visit (INDEPENDENT_AMBULATORY_CARE_PROVIDER_SITE_OTHER): Payer: Medicare Other | Admitting: Pharmacist Clinician (PhC)/ Clinical Pharmacy Specialist

## 2020-01-08 DIAGNOSIS — Z7901 Long term (current) use of anticoagulants: Secondary | ICD-10-CM

## 2020-01-08 DIAGNOSIS — I480 Paroxysmal atrial fibrillation: Secondary | ICD-10-CM | POA: Diagnosis not present

## 2020-01-08 DIAGNOSIS — Z5181 Encounter for therapeutic drug level monitoring: Secondary | ICD-10-CM

## 2020-01-08 LAB — POCT INR: INR: 2.2 (ref 2.0–3.0)

## 2020-01-21 DIAGNOSIS — N200 Calculus of kidney: Secondary | ICD-10-CM | POA: Diagnosis not present

## 2020-01-21 DIAGNOSIS — R3121 Asymptomatic microscopic hematuria: Secondary | ICD-10-CM | POA: Diagnosis not present

## 2020-01-21 DIAGNOSIS — R1084 Generalized abdominal pain: Secondary | ICD-10-CM | POA: Diagnosis not present

## 2020-01-22 DIAGNOSIS — H2512 Age-related nuclear cataract, left eye: Secondary | ICD-10-CM | POA: Diagnosis not present

## 2020-01-29 ENCOUNTER — Telehealth: Payer: Self-pay | Admitting: Gastroenterology

## 2020-01-29 NOTE — Telephone Encounter (Signed)
Pt states that he recently had a blockage and he just started to experience the same sxs so he would like to be seen asap before it gets worse. First avail appt with APP is on 6/7 but pt would like something sooner. Pls call him.

## 2020-01-29 NOTE — Telephone Encounter (Signed)
Spoke with the patient. He reports generalized abdominal discomfort with the most discomfort in the right mid to right lower abdominal area. He has passed some stool today. Describes the stools as "like raisins."  Prior to his small bowel obstruction he had stools that were more "candy bars and snakes." He feels like he needs to pass more stool. He is not comfortable and admits he is "gun shy" since he had the obstruction. Using daily Citrecil. Please advise.

## 2020-01-29 NOTE — Telephone Encounter (Signed)
Dr Ardis Hughs As per our telephone conversation, this really delightful patient has been instructed to take 6 to 7 doses of Miralax in 32 ounces of Gatorade tonight drinking as a prep spaced by 15 minutes each glass until gone. He is appreciative of the help and thanks me for the call.

## 2020-02-01 NOTE — Telephone Encounter (Signed)
03/08/20 at 1030 am appt with Dr Ardis Hughs The patient has been notified of this information and all questions answered.

## 2020-02-01 NOTE — Telephone Encounter (Signed)
Pt states that Miralax helped a little bit but his stomach is still tender and sore. He would like to see Dr. Ardis Hughs and is requesting if he can squeeze him before July. Pls call him.

## 2020-02-03 DIAGNOSIS — N202 Calculus of kidney with calculus of ureter: Secondary | ICD-10-CM | POA: Diagnosis not present

## 2020-02-03 DIAGNOSIS — R1084 Generalized abdominal pain: Secondary | ICD-10-CM | POA: Diagnosis not present

## 2020-02-04 DIAGNOSIS — N201 Calculus of ureter: Secondary | ICD-10-CM | POA: Diagnosis not present

## 2020-02-14 ENCOUNTER — Other Ambulatory Visit: Payer: Self-pay | Admitting: Family Medicine

## 2020-02-17 ENCOUNTER — Ambulatory Visit (INDEPENDENT_AMBULATORY_CARE_PROVIDER_SITE_OTHER): Payer: Medicare Other | Admitting: Nurse Practitioner

## 2020-02-17 ENCOUNTER — Encounter: Payer: Self-pay | Admitting: Nurse Practitioner

## 2020-02-17 VITALS — BP 124/80 | HR 60 | Ht 73.0 in | Wt 192.0 lb

## 2020-02-17 DIAGNOSIS — K5900 Constipation, unspecified: Secondary | ICD-10-CM | POA: Diagnosis not present

## 2020-02-17 NOTE — Patient Instructions (Signed)
If you are age 73 or older, your body mass index should be between 23-30. Your Body mass index is 25.33 kg/m. If this is out of the aforementioned range listed, please consider follow up with your Primary Care Provider.  If you are age 65 or younger, your body mass index should be between 19-25. Your Body mass index is 25.33 kg/m. If this is out of the aformentioned range listed, please consider follow up with your Primary Care Provider.   Miralax as needed.

## 2020-02-17 NOTE — Progress Notes (Addendum)
IMPRESSION and PLAN:    74 year old male with PMH significant for hypertension, PAF, TIA, HTN, hyperlipidemia,  arthritis, SBO  # Constipation / lower abdominal pain.  --Resolved after bowel purge --frequently has hard, lumpy stools --start daily Miralax. Instructed to titrate dose as needed. Can take QOD to once daily. Call office for refractory constipation  # Partial SBO in March 2021 . Transition in RLQ without associated inflammatory changes.  --Obstructive probably secondary to adhesions related to remote hernia repair.  --CCS saw in hospital and mentioned possible stricture from Crohn's disease. Patient has no known history of Crohn's disease. Many, many years ago patient had problems with constipation and Crohn's disease was apparently mentioned to him. However, upon follow up his symptoms resolved and patient told he did not have Crohn's. No evidence for Crohn's disease clinically, endoscopically or radiographically over the years.    HPI:    Primary GI: Francis Caprice, MD       Chief complaint :  constipation  Patient was admitted to the hospital early March of this year with RLQ pain. CT scan remarkable for SBO, transition point in the RLQ adjacent to a previous mesh hernia repair.  No associated inflammatory changes on imaging. CCS evaluated Attempted NG tube for decompression were unsuccessful..Contrast in colon on 8 hour film argued against complete obstruction.  Patient says that after drinking contrast for CT scan he began having multiple bowel movements/passing gas and obstruction resolved.  Follow-up KUB did show resolving distention.  Surgery felt like obstruction could be secondary to adhesions from prior hernia repair or a chronic stricture from history of Crohn's disease.  We saw him in clinic several days later, felt obstruction was adhesive related.  We recommended daily fiber to prevent constipation.  I saw the patient last on 01/06/2020 for evaluation of  RLQ pain.  The discomfort was transient and occurring only with position changes.  Felt to be musculoskeletal.  He had no associated obstructive symptoms  01/29/20 Patient called the office with complaints of generalized abdominal discomfort mainly in the mid right and RLQ.  He described constipation despite Citrucel.  Was advised to take 6 7 doses of MiraLAX in 32 ounces of Gatorade.  He took only 3 doses but had excellent results, discomfort resolved.  Patient comes in today for follow-up.    Interval History:  No more abdominal discomfort.  He inquires about what to do when constipated and should he be on Miralax on a daily basis. His stools are often like lumpy / like raisins ( 1 and 2 on Bristol stool scale)  Review of systems:     No chest pain, no SOB, no fevers, no urinary sx   Past Medical History:  Diagnosis Date  . Allergy   . Aortic regurgitation   . Arthritis   . Asthma    only as child  . Atrial flutter ([place])    a. Dx 09/2012.  . Crohn disease ([place])    Remotely - in the 1990s  . Crohn's colitis ([place]) 11/20/1984  . Esophageal reflux    Remotely  . History of kidney stones   . Hypercholesteremia   . Hypertension   . Paroxysmal atrial fibrillation (HCC)    Takes PRN flecainide  . 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  [name] TIA (transient ischemic attack)    ~2005 - had visual symptoms, was told by [name] this may have  been a TIA but it's unclear  . Valvular heart disease    Mild AI, ASclerosis without AS by echo 10/2011    Patient's surgical history, family medical history, social history, medications and allergies were all reviewed in Epic   Creatinine clearance cannot be calculated (Patient's most recent lab result is older than the maximum 21 days allowed.)  Current Outpatient Medications  Medication Sig Dispense Refill  . acetaminophen (TYLENOL) 325 MG tablet Take 325 mg by mouth every 6 (six) hours as needed for mild pain or  headache.    . ALPRAZolam (XANAX) 0.25 MG tablet Take 1 tablet (0.25 mg total) by mouth at bedtime as needed for anxiety or sleep. TAKE 1 TABLET BY MOUTH AT BEDTIME AS NEEDED FOR ANXIETY 30 tablet 3  . Cholecalciferol (VITAMIN D3) 1000 UNITS CAPS Take 2,000 Units by mouth every morning.     . diltiazem (CARTIA XT) 240 MG 24 hr capsule Take 240 mg by mouth in the morning 90 capsule 3  . flecainide (TAMBOCOR) 100 MG tablet Take 1 tablet (100 mg total) by mouth 2 (two) times daily. 180 tablet 2  . gatifloxacin (ZYMAXID) 0.5 % SOLN Place 1 drop into the right eye 4 (four) times daily.    [name] losartan (COZAAR) 25 MG tablet TAKE 1 TABLET EVERY DAY (Patient taking differently: Take 25 mg by mouth daily. ) 90 tablet 1  . metoprolol succinate (TOPROL-XL) 25 MG 24 hr tablet Take 1 tablet (25 mg total) by mouth daily. Take with or immediately following a meal. 90 tablet 3  . Multiple Vitamin (MULTIVITAMIN) tablet Take 1 tablet by mouth daily.    . potassium citrate (UROCIT-K) 10 MEQ (1080 MG) SR tablet Take 10 mEq by mouth 3 (three) times daily with meals.   11  . rosuvastatin (CRESTOR) 20 MG tablet Take 1 tablet (20 mg total) by mouth daily. TAKE 1 TABLET EVERY DAY 90 tablet 3  . sertraline (ZOLOFT) 50 MG tablet Take 1 tablet (50 mg total) by mouth daily. NEEDS OFFICE VISIT  FOR FURTHER REFILLS 30 tablet 0  . Turmeric 500 MG CAPS Take 500 mg by mouth daily.    [name] warfarin (COUMADIN) 5 MG tablet TAKE 1 TO 2 TABLETS DAILY AS  DIRECTED BY COUMADIN CLINIC (Patient taking differently: Take 5-7.5 mg by mouth See admin instructions. Mon, Wed, Friday 7.54m Tuesday, Thursday, Saturday, Sunday 559m 180 tablet 0   No current facility-administered medications for this visit.    Physical Exam:     BP 124/80   Pulse 60   Ht 6' 1"  (1.854 m)   Wt 192 lb (87.1 kg)   BMI 25.33 kg/m   GENERAL:  Pleasant male in NAD PSYCH: : Cooperative, normal affect CARDIAC:  RRR PULM: Normal respiratory effort, lungs CTA  bilaterally, no wheezing ABDOMEN:  Nondistended, soft, nontender. No obvious masses, no hepatomegaly,  normal bowel sounds SKIN:  turgor, no lesions seen Musculoskeletal:  Normal muscle tone, normal strength NEURO: Alert and oriented x 3, no focal neurologic deficits   PaTye Savoy NP 02/17/2020, 2:21 PM

## 2020-02-18 ENCOUNTER — Encounter: Payer: Self-pay | Admitting: Nurse Practitioner

## 2020-02-19 ENCOUNTER — Other Ambulatory Visit: Payer: Self-pay

## 2020-02-19 ENCOUNTER — Ambulatory Visit (INDEPENDENT_AMBULATORY_CARE_PROVIDER_SITE_OTHER): Payer: Medicare Other | Admitting: Pharmacist Clinician (PhC)/ Clinical Pharmacy Specialist

## 2020-02-19 DIAGNOSIS — Z7901 Long term (current) use of anticoagulants: Secondary | ICD-10-CM | POA: Diagnosis not present

## 2020-02-19 DIAGNOSIS — I480 Paroxysmal atrial fibrillation: Secondary | ICD-10-CM | POA: Diagnosis not present

## 2020-02-19 DIAGNOSIS — Z5181 Encounter for therapeutic drug level monitoring: Secondary | ICD-10-CM

## 2020-02-19 LAB — POCT INR: INR: 2.2 (ref 2.0–3.0)

## 2020-02-19 NOTE — Progress Notes (Signed)
I agree with the above note, plan 

## 2020-02-26 ENCOUNTER — Other Ambulatory Visit: Payer: Self-pay

## 2020-02-29 ENCOUNTER — Ambulatory Visit (INDEPENDENT_AMBULATORY_CARE_PROVIDER_SITE_OTHER): Payer: Medicare Other | Admitting: Family Medicine

## 2020-02-29 ENCOUNTER — Other Ambulatory Visit: Payer: Self-pay

## 2020-02-29 ENCOUNTER — Encounter: Payer: Self-pay | Admitting: Family Medicine

## 2020-02-29 VITALS — BP 120/62 | HR 60 | Temp 98.4°F | Wt 193.1 lb

## 2020-02-29 DIAGNOSIS — F5104 Psychophysiologic insomnia: Secondary | ICD-10-CM | POA: Diagnosis not present

## 2020-02-29 DIAGNOSIS — I480 Paroxysmal atrial fibrillation: Secondary | ICD-10-CM | POA: Diagnosis not present

## 2020-02-29 DIAGNOSIS — F339 Major depressive disorder, recurrent, unspecified: Secondary | ICD-10-CM

## 2020-02-29 MED ORDER — SERTRALINE HCL 50 MG PO TABS: 50.0000 mg | ORAL_TABLET | Freq: Every day | ORAL | 3 refills | Status: DC

## 2020-02-29 NOTE — Progress Notes (Signed)
Established Patient Office Visit  Subjective:  Patient ID: Francis Verdell., male    DOB: 1946-05-17  Age: 74 y.o. MRN: [id number]  CC:  Chief Complaint  Patient presents with  . Medication Refill    Pt is here to get medication refills     HPI Francis Campbell. presents for medical follow-up He has history of atrial fibrillation, regional enteritis, chronic Coumadin therapy, history of BPH, chronic insomnia, recurrent depression.  He has been on sertraline for years and seems to be working well for him.  Requesting refills of sertraline.  He has also been on low-dose alprazolam 0.25 mg at night for years for insomnia issues and this seems to work well.  Denies any balance issues.  No regular use of alcohol.  Avoids late the use of caffeine.  He is followed by multiple specialists including cardiology and urology.  Gets PSAs checked yearly by urology No recent dizziness or chest pain.  No falls.  Feels well overall. Good appetite.   Past Medical History:  Diagnosis Date  . Allergy   . Aortic regurgitation   . Arthritis   . Asthma    only as child  . Atrial flutter ([place])    a. Dx 09/2012.  . Crohn disease ([place])    Remotely - in the 1990s  . Crohn's colitis ([place]) 11/20/1984  . Esophageal reflux    Remotely  . History of kidney stones   . Hypercholesteremia   . Hypertension   . Paroxysmal atrial fibrillation (HCC)    Takes PRN flecainide  . 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  [name] TIA (transient ischemic attack)    ~2005 - had visual symptoms, was told by [name] this may have been a TIA but it's unclear  . Valvular heart disease    Mild AI, ASclerosis without AS by echo 10/2011    Past Surgical History:  Procedure Laterality Date  . CARDIOVERSION N/A 05/08/2013   Procedure: CARDIOVERSION;  Surgeon: Darlin Coco, MD;  Location: [facility] ENDOSCOPY;  Service: Cardiovascular;  Laterality: N/A;  . CATARACT EXTRACTION     Right 12/2019  .  CYSTOSCOPY/RETROGRADE/URETEROSCOPY/STONE EXTRACTION WITH BASKET Right 02/12/2013   Procedure: CYSTOSCOPY/RETROGRADE/URETEROSCOPY/STONE EXTRACTION WITH BASKET, WITH  STENT PLACEMENT RIGHT URETERAL DILATION;  Surgeon: [name], MD;  Location: [facility] ORS;  Service: Urology;  Laterality: Right;  . EYE SURGERY Bilateral 1950, 1960, 1964   left, right, left  . HERNIA REPAIR  2005  . KNEE SURGERY Bilateral 1990, 2000  . LITHOTRIPSY    . ROTATOR CUFF REPAIR Left 2000  . TONSILLECTOMY      Family History  Problem Relation Age of Onset  . Stroke Mother   . Hypertension Mother   . Kidney disease Mother        stones  . Nephrolithiasis Brother   . Arthritis Paternal Grandmother   . Heart disease Paternal Grandmother   . Leukemia Father   . Heart disease Father   . Nephrolithiasis Father   . Kidney cancer Son   . Appendicitis Son   . Heart attack Neg Hx   . Colon cancer Neg Hx   . Esophageal cancer Neg Hx   . Pancreatic cancer Neg Hx   . Stomach cancer Neg Hx   . Liver disease Neg Hx     Social History   Socioeconomic History  . Marital status: Married    Spouse name: Not on file  . Number  of children: 2  . Years of education: Not on file  . Highest education level: Not on file  Occupational History  . Occupation: TECHNICAL SUP    Employer: [employer]  Tobacco Use  . Smoking status: Never Smoker  . Smokeless tobacco: Never Used  Vaping Use  . Vaping Use: Never used  Substance and Sexual Activity  . Alcohol use: No  . Drug use: No  . Sexual activity: Yes    Birth control/protection: None  Other Topics Concern  . Not on file  Social History Narrative   Non smoker  No ets          Social Determinants of Health   Financial Resource Strain:   . Difficulty of Paying Living Expenses:   Food Insecurity:   . Worried About Charity fundraiser in the Last Year:   . Arboriculturist in the Last Year:   Transportation Needs:   . Film/video editor (Medical):   [name] Lack of  Transportation (Non-Medical):   Physical Activity:   . Days of Exercise per Week:   . Minutes of Exercise per Session:   Stress:   . Feeling of Stress :   Social Connections:   . Frequency of Communication with Friends and Family:   . Frequency of Social Gatherings with Friends and Family:   . Attends Religious Services:   . Active Member of Clubs or Organizations:   . Attends Archivist Meetings:   [name] Marital Status:   Intimate Partner Violence:   . Fear of Current or Ex-Partner:   . Emotionally Abused:   [name] Physically Abused:   . Sexually Abused:     Outpatient Medications Prior to Visit  Medication Sig Dispense Refill  . ALPRAZolam (XANAX) 0.25 MG tablet Take 1 tablet (0.25 mg total) by mouth at bedtime as needed for anxiety or sleep. TAKE 1 TABLET BY MOUTH AT BEDTIME AS NEEDED FOR ANXIETY 30 tablet 3  . Cholecalciferol (VITAMIN D3) 1000 UNITS CAPS Take 2,000 Units by mouth every morning.     . diltiazem (CARTIA XT) 240 MG 24 hr capsule Take 240 mg by mouth in the morning 90 capsule 3  . flecainide (TAMBOCOR) 100 MG tablet Take 1 tablet (100 mg total) by mouth 2 (two) times daily. 180 tablet 2  . losartan (COZAAR) 25 MG tablet TAKE 1 TABLET EVERY DAY (Patient taking differently: Take 25 mg by mouth daily. ) 90 tablet 1  . metoprolol succinate (TOPROL-XL) 25 MG 24 hr tablet Take 1 tablet (25 mg total) by mouth daily. Take with or immediately following a meal. 90 tablet 3  . Multiple Vitamin (MULTIVITAMIN) tablet Take 1 tablet by mouth daily.    . potassium citrate (UROCIT-K) 10 MEQ (1080 MG) SR tablet Take 10 mEq by mouth 3 (three) times daily with meals.   11  . rosuvastatin (CRESTOR) 20 MG tablet Take 1 tablet (20 mg total) by mouth daily. TAKE 1 TABLET EVERY DAY 90 tablet 3  . Turmeric 500 MG CAPS Take 500 mg by mouth daily.    [name] warfarin (COUMADIN) 5 MG tablet TAKE 1 TO 2 TABLETS DAILY AS  DIRECTED BY COUMADIN CLINIC (Patient taking differently: Take 5-7.5 mg by mouth  See admin instructions. Mon, Wed, Friday 7.70m Tuesday, Thursday, Saturday, Sunday 565m 180 tablet 0  . sertraline (ZOLOFT) 50 MG tablet Take 1 tablet (50 mg total) by mouth daily. NEEDS OFFICE VISIT  FOR FURTHER REFILLS 30 tablet 0  .  acetaminophen (TYLENOL) 325 MG tablet Take 325 mg by mouth every 6 (six) hours as needed for mild pain or headache.    . gatifloxacin (ZYMAXID) 0.5 % SOLN Place 1 drop into the right eye 4 (four) times daily.     No facility-administered medications prior to visit.    Allergies  Allergen Reactions  . Chocolate Palpitations  . Chocolate Flavor Shortness Of Breath    Other reaction(s): Respiratory Distress  . Dextroamphetamine Shortness Of Breath    Other reaction(s): Respiratory Distress  . Dextromethorphan Palpitations  . Flomax [Tamsulosin Hcl] Other (See Comments)    ? Causes A Fib  . Floxin [Ofloxacin] Other (See Comments)    hallucinations  . Guaifenesin Shortness Of Breath  . Lisinopril Cough  . Tavist Allergy [Clemastine Fumarate] Other (See Comments)    A fib worse  . Clemastine     Other reaction(s): Irregular Heart Rate  . Robitussin Cold+Flu Daytime  [Dm-Phenylephrine-Acetaminophen]     Other reaction(s): Respiratory Distress  . Tamsulosin Other (See Comments)    Other reaction(s): Irregular Heart Rate  . Amiodarone Other (See Comments)    Abn  thyroid  . Robitussin Cf [Pseudoephedrine-Dm-Gg] Palpitations    ROS Review of Systems  Constitutional: Negative for fatigue and unexpected weight change.  Eyes: Negative for visual disturbance.  Respiratory: Negative for cough, chest tightness and shortness of breath.   Cardiovascular: Negative for chest pain, palpitations and leg swelling.  Neurological: Negative for dizziness, syncope, weakness, light-headedness and headaches.  Psychiatric/Behavioral: Negative for dysphoric mood and sleep disturbance.      Objective:    Physical Exam Constitutional:      Appearance: He is  well-developed.  HENT:     Right Ear: External ear normal.     Left Ear: External ear normal.  Eyes:     Pupils: Pupils are equal, round, and reactive to light.  Neck:     Thyroid: No thyromegaly.  Cardiovascular:     Rate and Rhythm: Normal rate and regular rhythm.  Pulmonary:     Effort: Pulmonary effort is normal. No respiratory distress.     Breath sounds: Normal breath sounds. No wheezing or rales.  Musculoskeletal:     Cervical back: Neck supple.  Neurological:     Mental Status: He is alert and oriented to person, place, and time.     BP 120/62 (BP Location: Left Arm, Patient Position: Sitting, Cuff Size: Normal)   Pulse 60   Temp 98.4 F (36.9 C) (Temporal)   Wt 193 lb 1.6 oz (87.6 kg)   SpO2 95%   BMI 25.48 kg/m  Wt Readings from Last 3 Encounters:  02/29/20 193 lb 1.6 oz (87.6 kg)  02/17/20 192 lb (87.1 kg)  01/06/20 190 lb (86.2 kg)     There are no preventive care reminders to display for this patient.  There are no preventive care reminders to display for this patient.  Lab Results  Component Value Date   TSH 4.07 08/06/2013   Lab Results  Component Value Date   WBC 7.5 11/21/2019   HGB 14.4 11/21/2019   HCT 45.6 11/21/2019   MCV 90.5 11/21/2019   PLT 174 11/21/2019   Lab Results  Component Value Date   NA 142 11/21/2019   K 3.7 11/21/2019   CO2 24 11/21/2019   GLUCOSE 84 11/21/2019   BUN 23 11/21/2019   CREATININE 0.88 11/21/2019   BILITOT 1.2 11/21/2019   ALKPHOS 52 11/21/2019   AST 21 11/21/2019  ALT 24 11/21/2019   PROT 5.7 (L) 11/21/2019   ALBUMIN 3.4 (L) 11/21/2019   CALCIUM 8.3 (L) 11/21/2019   ANIONGAP 7 11/21/2019   GFR 77.14 07/06/2015   Lab Results  Component Value Date   CHOL 165 11/12/2019   Lab Results  Component Value Date   HDL 43 11/12/2019   Lab Results  Component Value Date   LDLCALC 92 11/12/2019   Lab Results  Component Value Date   TRIG 171 (H) 11/12/2019   Lab Results  Component Value Date    CHOLHDL 3.8 11/12/2019   No results found for: HGBA1C    Assessment & Plan:   #1 history of recurrent depression currently stable on sertraline  -Refill sertraline for 1 year  #2 history of chronic insomnia -Sleep hygiene discussed -Continue low-dose alprazolam 0.25 mg 1 nightly  #3 chronic intermittent atrial fibrillation.  Appears to be in sinus rhythm today.  Continue Coumadin and medications for rate control as above  Meds ordered this encounter  Medications  . sertraline (ZOLOFT) 50 MG tablet    Sig: Take 1 tablet (50 mg total) by mouth daily.    Dispense:  90 tablet    Refill:  3    Follow-up: No follow-ups on file.    [name], MD

## 2020-03-08 ENCOUNTER — Ambulatory Visit: Payer: Medicare Other | Admitting: Gastroenterology

## 2020-03-10 ENCOUNTER — Other Ambulatory Visit: Payer: Self-pay | Admitting: Cardiovascular Disease

## 2020-03-10 ENCOUNTER — Other Ambulatory Visit: Payer: Self-pay | Admitting: Family Medicine

## 2020-03-16 ENCOUNTER — Other Ambulatory Visit: Payer: Self-pay

## 2020-03-16 ENCOUNTER — Ambulatory Visit (INDEPENDENT_AMBULATORY_CARE_PROVIDER_SITE_OTHER): Payer: Medicare Other | Admitting: Otolaryngology

## 2020-03-16 VITALS — Temp 97.0°F

## 2020-03-16 DIAGNOSIS — H6123 Impacted cerumen, bilateral: Secondary | ICD-10-CM

## 2020-03-16 NOTE — Progress Notes (Signed)
HPI: Francis Campbell. is a 74 y.o. male who presents for evaluation of wax removal.  He was seen 6 months ago returns today for standard recheck that he prefers as he has had problems with infections in his ears previously when he delayed too long..  Past Medical History:  Diagnosis Date  . Allergy   . Aortic regurgitation   . Arthritis   . Asthma    only as child  . Atrial flutter ([place])    a. Dx 09/2012.  . Crohn disease ([place])    Remotely - in the 1990s  . Crohn's colitis ([place]) 11/20/1984  . Esophageal reflux    Remotely  . History of kidney stones   . Hypercholesteremia   . Hypertension   . Paroxysmal atrial fibrillation (HCC)    Takes PRN flecainide  . 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  [name] TIA (transient ischemic attack)    ~2005 - had visual symptoms, was told by [name] this may have been a TIA but it's unclear  . Valvular heart disease    Mild AI, ASclerosis without AS by echo 10/2011   Past Surgical History:  Procedure Laterality Date  . CARDIOVERSION N/A 05/08/2013   Procedure: CARDIOVERSION;  Surgeon: Darlin Coco, MD;  Location: [facility] ENDOSCOPY;  Service: Cardiovascular;  Laterality: N/A;  . CATARACT EXTRACTION     Right 12/2019  . CYSTOSCOPY/RETROGRADE/URETEROSCOPY/STONE EXTRACTION WITH BASKET Right 02/12/2013   Procedure: CYSTOSCOPY/RETROGRADE/URETEROSCOPY/STONE EXTRACTION WITH BASKET, WITH  STENT PLACEMENT RIGHT URETERAL DILATION;  Surgeon: [name], MD;  Location: [facility] ORS;  Service: Urology;  Laterality: Right;  . EYE SURGERY Bilateral 1950, 1960, 1964   left, right, left  . HERNIA REPAIR  2005  . KNEE SURGERY Bilateral 1990, 2000  . LITHOTRIPSY    . ROTATOR CUFF REPAIR Left 2000  . TONSILLECTOMY     Social History   Socioeconomic History  . Marital status: Married    Spouse name: Not on file  . Number of children: 2  . Years of education: Not on file  . Highest education level: Not on file  Occupational History  .  Occupation: TECHNICAL SUP    Employer: [employer]  Tobacco Use  . Smoking status: Never Smoker  . Smokeless tobacco: Never Used  Vaping Use  . Vaping Use: Never used  Substance and Sexual Activity  . Alcohol use: No  . Drug use: No  . Sexual activity: Yes    Birth control/protection: None  Other Topics Concern  . Not on file  Social History Narrative   Non smoker  No ets          Social Determinants of Health   Financial Resource Strain:   . Difficulty of Paying Living Expenses:   Food Insecurity:   . Worried About Charity fundraiser in the Last Year:   . Arboriculturist in the Last Year:   Transportation Needs:   . Film/video editor (Medical):   [name] Lack of Transportation (Non-Medical):   Physical Activity:   . Days of Exercise per Week:   . Minutes of Exercise per Session:   Stress:   . Feeling of Stress :   Social Connections:   . Frequency of Communication with Friends and Family:   . Frequency of Social Gatherings with Friends and Family:   . Attends Religious Services:   . Active Member of Clubs or Organizations:   . Attends Club or  Organization Meetings:   [name] Marital Status:    Family History  Problem Relation Age of Onset  . Stroke Mother   . Hypertension Mother   . Kidney disease Mother        stones  . Nephrolithiasis Brother   . Arthritis Paternal Grandmother   . Heart disease Paternal Grandmother   . Leukemia Father   . Heart disease Father   . Nephrolithiasis Father   . Kidney cancer Son   . Appendicitis Son   . Heart attack Neg Hx   . Colon cancer Neg Hx   . Esophageal cancer Neg Hx   . Pancreatic cancer Neg Hx   . Stomach cancer Neg Hx   . Liver disease Neg Hx    Allergies  Allergen Reactions  . Chocolate Palpitations  . Chocolate Flavor Shortness Of Breath    Other reaction(s): Respiratory Distress  . Dextroamphetamine Shortness Of Breath    Other reaction(s): Respiratory Distress  . Dextromethorphan Palpitations  . Flomax  [Tamsulosin Hcl] Other (See Comments)    ? Causes A Fib  . Floxin [Ofloxacin] Other (See Comments)    hallucinations  . Guaifenesin Shortness Of Breath  . Lisinopril Cough  . Tavist Allergy [Clemastine Fumarate] Other (See Comments)    A fib worse  . Clemastine     Other reaction(s): Irregular Heart Rate  . Robitussin Cold+Flu Daytime  [Dm-Phenylephrine-Acetaminophen]     Other reaction(s): Respiratory Distress  . Tamsulosin Other (See Comments)    Other reaction(s): Irregular Heart Rate  . Amiodarone Other (See Comments)    Abn  thyroid  . Robitussin Cf [Pseudoephedrine-Dm-Gg] Palpitations   Prior to Admission medications   Medication Sig Start Date End Date Taking? Authorizing Provider  ALPRAZolam (XANAX) 0.25 MG tablet Take 1 tablet (0.25 mg total) by mouth at bedtime as needed for anxiety or sleep. TAKE 1 TABLET BY MOUTH AT BEDTIME AS NEEDED FOR ANXIETY 12/21/19  Yes Burchette, Alinda Sierras, MD  Cholecalciferol (VITAMIN D3) 1000 UNITS CAPS Take 2,000 Units by mouth every morning.    Yes [provider]  diltiazem (CARTIA XT) 240 MG 24 hr capsule Take 240 mg by mouth in the morning 12/31/19  Yes Skeet Latch, MD  flecainide (TAMBOCOR) 100 MG tablet Take 1 tablet (100 mg total) by mouth 2 (two) times daily. 11/12/19  Yes Skeet Latch, MD  losartan (COZAAR) 25 MG tablet TAKE 1 TABLET EVERY DAY 03/10/20  Yes Skeet Latch, MD  metoprolol succinate (TOPROL-XL) 25 MG 24 hr tablet Take 1 tablet (25 mg total) by mouth daily. Take with or immediately following a meal. 11/12/19  Yes Skeet Latch, MD  Multiple Vitamin (MULTIVITAMIN) tablet Take 1 tablet by mouth daily.   Yes [provider]  potassium citrate (UROCIT-K) 10 MEQ (1080 MG) SR tablet Take 10 mEq by mouth 3 (three) times daily with meals.  11/17/15  Yes [provider]  rosuvastatin (CRESTOR) 20 MG tablet Take 1 tablet (20 mg total) by mouth daily. TAKE 1 TABLET EVERY DAY 11/12/19  Yes Skeet Latch, MD  sertraline (ZOLOFT) 50 MG tablet Take 1 tablet (50 mg total) by mouth daily. 02/29/20  Yes Burchette, Alinda Sierras, MD  Turmeric 500 MG CAPS Take 500 mg by mouth daily.   Yes [provider]  warfarin (COUMADIN) 5 MG tablet TAKE 1 TO 2 TABLETS DAILY AS  DIRECTED BY COUMADIN CLINIC Patient taking differently: Take 5-7.5 mg by mouth See admin instructions. Mon, Wed, Friday 7.37m Tuesday, Thursday, Saturday,  Sunday 110m 11/02/19  Yes Meng, HIsaac Laud PA  digoxin (LANOXIN) 0.125 MG tablet Take 1 tablet (125 mcg total) by mouth daily. 11/26/11 11/27/11  BDarlin Coco MD  losartan-hydrochlorothiazide ([facility] 100-12.5 MG per tablet  08/26/11 11/27/11  [provider]  pantoprazole (PROTONIX) 40 MG tablet Take 1 tablet (40 mg total) by mouth daily. 10/29/11 11/27/11  PSable Feil MD     Positive ROS: Otherwise negative  All other systems have been reviewed and were otherwise negative with the exception of those mentioned in the HPI and as above.  Physical Exam: Constitutional: Alert, well-appearing, no acute distress Ears: External ears without lesions or tenderness. Ear canals mild to moderate wax buildup with a large amount of hairs that was cleaned in the office with forceps and curettes.  TMs were clear bilaterally. Nasal: External nose without lesions. Clear nasal passages Oral: Oropharynx clear. Neck: No palpable adenopathy or masses Respiratory: Breathing comfortably  Skin: No facial/neck lesions or rash noted.  Cerumen impaction removal  Date/Time: 03/16/2020 8:53 AM Performed by: NRozetta Nunnery MD Authorized by: NRozetta Nunnery MD   Consent:    Consent obtained:  Verbal   Consent given by:  Patient   Risks discussed:  Pain and bleeding Procedure details:    Location:  L ear and R ear   Procedure type: curette and forceps   Post-procedure details:    Inspection:  TM intact and canal normal   Hearing quality:  Improved   Patient tolerance  of procedure:  Tolerated well, no immediate complications Comments:     TMs are clear bilaterally.    Assessment: Bilateral wax buildup.  Plan: Patient prefers to be seen in 6 months.  He is doing well.  CRadene Journey MD

## 2020-03-18 ENCOUNTER — Ambulatory Visit: Payer: Medicare Other | Admitting: Gastroenterology

## 2020-04-01 ENCOUNTER — Other Ambulatory Visit: Payer: Self-pay

## 2020-04-01 ENCOUNTER — Ambulatory Visit (INDEPENDENT_AMBULATORY_CARE_PROVIDER_SITE_OTHER): Payer: Medicare Other | Admitting: Pharmacist

## 2020-04-01 DIAGNOSIS — I480 Paroxysmal atrial fibrillation: Secondary | ICD-10-CM

## 2020-04-01 DIAGNOSIS — Z5181 Encounter for therapeutic drug level monitoring: Secondary | ICD-10-CM

## 2020-04-01 LAB — POCT INR: INR: 2.7 (ref 2.0–3.0)

## 2020-04-11 MED ORDER — WARFARIN SODIUM 5 MG PO TABS: ORAL_TABLET | ORAL | 1 refills | Status: DC

## 2020-04-14 ENCOUNTER — Other Ambulatory Visit: Payer: Self-pay | Admitting: Family Medicine

## 2020-04-17 ENCOUNTER — Other Ambulatory Visit: Payer: Self-pay | Admitting: Family Medicine

## 2020-04-18 NOTE — Telephone Encounter (Signed)
Please advise 

## 2020-05-04 ENCOUNTER — Other Ambulatory Visit: Payer: Self-pay

## 2020-05-04 ENCOUNTER — Ambulatory Visit (INDEPENDENT_AMBULATORY_CARE_PROVIDER_SITE_OTHER): Payer: Medicare Other

## 2020-05-04 DIAGNOSIS — Z0000 Encounter for general adult medical examination without abnormal findings: Secondary | ICD-10-CM

## 2020-05-04 NOTE — Progress Notes (Signed)
Subjective:   Francis Campbell. is a 74 y.o. male who presents for Medicare Annual/Subsequent preventive examination.  I connected with Boaz Berisha today by telephone and verified that I am speaking with the correct person using two identifiers. Location patient: home Location provider: work Persons participating in the virtual visit: patient, provider.   I discussed the limitations, risks, security and privacy concerns of performing an evaluation and management service by telephone and the availability of in person appointments. I also discussed with the patient that there may be a patient responsible charge related to this service. The patient expressed understanding and verbally consented to this telephonic visit.    Interactive audio and video telecommunications were attempted between this provider and patient, however failed, due to patient having technical difficulties OR patient did not have access to video capability.  We continued and completed visit with audio only.       Review of Systems    N/A Cardiac Risk Factors include: advanced age (>65mn, >>36women);dyslipidemia;hypertension;male gender     Objective:    Today's Vitals   There is no height or weight on file to calculate BMI.  Advanced Directives 05/04/2020 11/20/2019 10/11/2016 08/18/2015 06/27/2015 06/23/2015 05/08/2013  Does Patient Have a Medical Advance Directive? Yes No No No Yes Yes Patient does not have advance directive  Type of Advance Directive [place] will - - - Living will - -  Does patient want to make changes to medical advance directive? No - Patient declined - - - No - Patient declined - -  Copy of [place]? No - copy requested - - - Yes Yes -  Would patient like information on creating a medical advance directive? - No - Patient declined - No - patient declined information - - -  Pre-existing out of facility DNR order (yellow form or pink  MOST form) - - - - - - -    Current Medications (verified) Outpatient Encounter Medications as of 05/04/2020  Medication Sig  . ALPRAZolam (XANAX) 0.25 MG tablet TAKE 1 TAB AT BEDTIME AS NEEDED FOR ANXIETY OR SLEEP.  .Marland KitchenCholecalciferol (VITAMIN D3) 1000 UNITS CAPS Take 2,000 Units by mouth every morning.   . diltiazem (CARTIA XT) 240 MG 24 hr capsule Take 240 mg by mouth in the morning  . flecainide (TAMBOCOR) 100 MG tablet Take 1 tablet (100 mg total) by mouth 2 (two) times daily.  .Marland Kitchenlosartan (COZAAR) 25 MG tablet TAKE 1 TABLET EVERY DAY  . metoprolol succinate (TOPROL-XL) 25 MG 24 hr tablet Take 1 tablet (25 mg total) by mouth daily. Take with or immediately following a meal.  . Multiple Vitamin (MULTIVITAMIN) tablet Take 1 tablet by mouth daily.  . potassium citrate (UROCIT-K) 10 MEQ (1080 MG) SR tablet Take 10 mEq by mouth 3 (three) times daily with meals.   . rosuvastatin (CRESTOR) 20 MG tablet Take 1 tablet (20 mg total) by mouth daily. TAKE 1 TABLET EVERY DAY  . sertraline (ZOLOFT) 50 MG tablet TAKE 1 TABLET (50 MG TOTAL) BY MOUTH DAILY. NEEDS OFFICE VISIT FOR FURTHER REFILLS  . Turmeric 500 MG CAPS Take 500 mg by mouth daily.  .Marland Kitchenwarfarin (COUMADIN) 5 MG tablet TAKE 1 TO 1.5 TABLETS DAILY AS  DIRECTED BY COUMADIN CLINIC  . [DISCONTINUED] digoxin (LANOXIN) 0.125 MG tablet Take 1 tablet (125 mcg total) by mouth daily.  . [DISCONTINUED] losartan-hydrochlorothiazide (HYZAAR) 100-12.5 MG per tablet   . [DISCONTINUED] pantoprazole (PROTONIX)  40 MG tablet Take 1 tablet (40 mg total) by mouth daily.   No facility-administered encounter medications on file as of 05/04/2020.    Allergies (verified) Chocolate, Chocolate flavor, Dextroamphetamine, Dextromethorphan, Flomax [tamsulosin hcl], Floxin [ofloxacin], Guaifenesin, Lisinopril, Tavist allergy [clemastine fumarate], Clemastine, Robitussin cold+flu daytime  [dm-phenylephrine-acetaminophen], Tamsulosin, Amiodarone, and Robitussin cf  [pseudoephedrine-dm-gg]   History: Past Medical History:  Diagnosis Date  . Allergy   . Aortic regurgitation   . Arthritis   . Asthma    only as child  . Atrial flutter ([place])    a. Dx 09/2012.  . Cataract   . Crohn disease ([place])    Remotely - in the 1990s  . Crohn's colitis ([place]) 11/20/1984  . Esophageal reflux    Remotely  . History of kidney stones   . Hypercholesteremia   . Hypertension   . Paroxysmal atrial fibrillation (HCC)    Takes PRN flecainide  . 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  [name] TIA (transient ischemic attack)    ~2005 - had visual symptoms, was told by [name] this may have been a TIA but it's unclear  . Valvular heart disease    Mild AI, ASclerosis without AS by echo 10/2011   Past Surgical History:  Procedure Laterality Date  . CARDIOVERSION N/A 05/08/2013   Procedure: CARDIOVERSION;  Surgeon: Darlin Coco, MD;  Location: [facility] ENDOSCOPY;  Service: Cardiovascular;  Laterality: N/A;  . CATARACT EXTRACTION     Right 12/2019  . CATARACT EXTRACTION    . CYSTOSCOPY/RETROGRADE/URETEROSCOPY/STONE EXTRACTION WITH BASKET Right 02/12/2013   Procedure: CYSTOSCOPY/RETROGRADE/URETEROSCOPY/STONE EXTRACTION WITH BASKET, WITH  STENT PLACEMENT RIGHT URETERAL DILATION;  Surgeon: [name], MD;  Location: [facility] ORS;  Service: Urology;  Laterality: Right;  . EYE SURGERY Bilateral 1950, 1960, 1964   left, right, left  . HERNIA REPAIR  2005  . KNEE SURGERY Bilateral 1990, 2000  . LITHOTRIPSY    . ROTATOR CUFF REPAIR Left 2000  . TONSILLECTOMY     Family History  Problem Relation Age of Onset  . Stroke Mother   . Hypertension Mother   . Kidney disease Mother        stones  . Nephrolithiasis Brother   . Arthritis Paternal Grandmother   . Heart disease Paternal Grandmother   . Leukemia Father   . Heart disease Father   . Nephrolithiasis Father   . Kidney cancer Son   . Appendicitis Son   . Heart attack Neg Hx   . Colon cancer Neg Hx    . Esophageal cancer Neg Hx   . Pancreatic cancer Neg Hx   . Stomach cancer Neg Hx   . Liver disease Neg Hx    Social History   Socioeconomic History  . Marital status: Married    Spouse name: Not on file  . Number of children: 2  . Years of education: Not on file  . Highest education level: Not on file  Occupational History  . Occupation: TECHNICAL SUP    Employer: [employer]  Tobacco Use  . Smoking status: Never Smoker  . Smokeless tobacco: Never Used  Vaping Use  . Vaping Use: Never used  Substance and Sexual Activity  . Alcohol use: No  . Drug use: No  . Sexual activity: Yes    Birth control/protection: None  Other Topics Concern  . Not on file  Social History Narrative   Non smoker  No ets  Social Determinants of Health   Financial Resource Strain: Low Risk   . Difficulty of Paying Living Expenses: Not hard at all  Food Insecurity: No Food Insecurity  . Worried About Charity fundraiser in the Last Year: Never true  . Ran Out of Food in the Last Year: Never true  Transportation Needs: No Transportation Needs  . Lack of Transportation (Medical): No  . Lack of Transportation (Non-Medical): No  Physical Activity: Inactive  . Days of Exercise per Week: 0 days  . Minutes of Exercise per Session: 0 min  Stress: No Stress Concern Present  . Feeling of Stress : Not at all  Social Connections: Socially Integrated  . Frequency of Communication with Friends and Family: More than three times a week  . Frequency of Social Gatherings with Friends and Family: More than three times a week  . Attends Religious Services: More than 4 times per year  . Active Member of Clubs or Organizations: Yes  . Attends Archivist Meetings: More than 4 times per year  . Marital Status: Married    Tobacco Counseling Counseling given: Not Answered   Clinical Intake:  Pre-visit preparation completed: Yes  Pain : No/denies pain     Nutritional Risks:  None Diabetes: No  How often do you need to have someone help you when you read instructions, pamphlets, or other written materials from your doctor or pharmacy?: 1 - Never What is the last grade level you completed in school?: 1 year of college  Diabetic?No  Interpreter Needed?: No  Information entered by :: [place] of Daily Living In your present state of health, do you have any difficulty performing the following activities: 05/04/2020 11/20/2019  Hearing? Y N  Comment Has some difficulties hearing. Gets hearing exams every 6 months -  Vision? N N  Difficulty concentrating or making decisions? N N  Walking or climbing stairs? N N  Dressing or bathing? N N  Doing errands, shopping? N N  Preparing Food and eating ? N -  Using the Toilet? N -  In the past six months, have you accidently leaked urine? N -  Do you have problems with loss of bowel control? N -  Managing your Medications? N -  Managing your Finances? N -  Housekeeping or managing your Housekeeping? N -  Some recent data might be hidden    Patient Care Team: [name], MD as PCP - General (Family Medicine) Skeet Latch, MD as PCP - Cardiology (Cardiology) Johnathan Hausen, MD as Consulting Physician (General Surgery) Milus Banister, MD as Attending Physician (Gastroenterology) Irine Seal, MD as Attending Physician (Urology)  Indicate any recent Medical Services you may have received from other than [facility] providers in the past year (date may be approximate).     Assessment:   This is a routine wellness examination for [facility].  Hearing/Vision screen  Hearing Screening   125Hz  250Hz  500Hz  1000Hz  2000Hz  3000Hz  4000Hz  6000Hz  8000Hz   Right ear:           Left ear:           Vision Screening Comments: Gets eye examined every 6 months   Dietary issues and exercise activities discussed: Current Exercise Habits: The patient does not participate in regular exercise at present, Exercise  limited by: None identified  Goals    . Exercise 150 minutes per week (moderate activity)     May walk more Early in the am or late in  the evening      Depression Screen PHQ 2/9 Scores 05/04/2020 10/11/2016 10/11/2016 02/21/2016 04/08/2015 07/30/2014  PHQ - 2 Score 0 0 0 0 0 0  PHQ- 9 Score 0 - - - - -    Fall Risk Fall Risk  05/04/2020 08/12/2019 08/01/2018 10/11/2016 10/11/2016  Falls in the past year? 0 0 0 No No  Comment - Emmi Telephone Survey: data to providers prior to load Emmi Telephone Survey: data to providers prior to load - -  Number falls in past yr: 0 - - - -  Injury with Fall? 0 - - - -  Risk for fall due to : Medication side effect - - - -  Follow up Falls evaluation completed;Falls prevention discussed - - - -    Any stairs in or around the home? No  If so, are there any without handrails? No  Home free of loose throw rugs in walkways, pet beds, electrical cords, etc? Yes  Adequate lighting in your home to reduce risk of falls? Yes   ASSISTIVE DEVICES UTILIZED TO PREVENT FALLS:  Life alert? No  Use of a cane, walker or w/c? No  Grab bars in the bathroom? No  Shower chair or bench in shower? No  Elevated toilet seat or a handicapped toilet? Yes   Cognitive Function: Cognitive screening not indicated based on direct observation MMSE - Mini Mental State Exam 10/11/2016  Not completed: (No Data)        Immunizations Immunization History  Administered Date(s) Administered  . Fluad Quad(high Dose 65+) 05/28/2019  . Influenza Split 07/30/2011, 07/06/2013  . Influenza, High Dose Seasonal PF 05/26/2015, 05/18/2016, 06/18/2017, 07/07/2018  . Influenza,inj,Quad PF,6+ Mos 06/16/2014  . PFIZER SARS-COV-2 Vaccination 10/22/2019, 11/17/2019  . Pneumococcal Conjugate-13 09/17/2010, 08/31/2013  . Pneumococcal Polysaccharide-23 04/08/2015  . Tdap 11/27/2011  . Zoster 09/28/2010    TDAP status: Up to date Flu Vaccine status: Up to date Pneumococcal vaccine status: Up  to date Covid-19 vaccine status: Completed vaccines  Qualifies for Shingles Vaccine? Yes   Zostavax completed Yes   Shingrix Completed?: No.    Education has been provided regarding the importance of this vaccine. Patient has been advised to call insurance company to determine out of pocket expense if they have not yet received this vaccine. Advised may also receive vaccine at local pharmacy or Health Dept. Verbalized acceptance and understanding.  Screening Tests Health Maintenance  Topic Date Due  . INFLUENZA VACCINE  04/17/2020  . COLONOSCOPY  11/06/2021  . TETANUS/TDAP  11/26/2021  . COVID-19 Vaccine  Completed  . Hepatitis C Screening  Completed  . PNA vac Low Risk Adult  Completed    Health Maintenance  Health Maintenance Due  Topic Date Due  . INFLUENZA VACCINE  04/17/2020    Colorectal cancer screening: Completed 11/07/2011. Repeat every 10 years  Lung Cancer Screening: (Low Dose CT Chest recommended if Age 34-80 years, 30 pack-year currently smoking OR have quit w/in 15years.) does not qualify.   Lung Cancer Screening Referral: N/A  Additional Screening:  Hepatitis C Screening: does qualify; Completed 10/11/2016  Vision Screening: Recommended annual ophthalmology exams for early detection of glaucoma and other disorders of the eye. Is the patient up to date with their annual eye exam?  Yes  Who is the provider or what is the name of the office in which the patient attends annual eye exams? Dr. Marica Otter  If pt is not established with a provider, would they like to be  referred to a provider to establish care? No .   Dental Screening: Recommended annual dental exams for proper oral hygiene  Community Resource Referral / Chronic Care Management: CRR required this visit?  No   CCM required this visit?  No      Plan:     I have personally reviewed and noted the following in the patient's chart:   . Medical and social history . Use of alcohol, tobacco or  illicit drugs  . Current medications and supplements . Functional ability and status . Nutritional status . Physical activity . Advanced directives . List of other physicians . Hospitalizations, surgeries, and ER visits in previous 12 months . Vitals . Screenings to include cognitive, depression, and falls . Referrals and appointments  In addition, I have reviewed and discussed with patient certain preventive protocols, quality metrics, and best practice recommendations. A written personalized care plan for preventive services as well as general preventive health recommendations were provided to patient.     Ofilia Neas, LPN   12/18/7541   Nurse Notes: None

## 2020-05-04 NOTE — Patient Instructions (Signed)
Francis Campbell , Thank you for taking time to come for your Medicare Wellness Visit. I appreciate your ongoing commitment to your health goals. Please review the following plan we discussed and let me know if I can assist you in the future.   Screening recommendations/referrals: Colonoscopy: Up to date, next due 11/06/2021 Recommended yearly ophthalmology/optometry visit for glaucoma screening and checkup Recommended yearly dental visit for hygiene and checkup  Vaccinations: Influenza vaccine: up to date, next due this fall 2021 Pneumococcal vaccine: Completed series  Tdap vaccine: Up to date, next due 11/26/2021 Shingles vaccine: Currently due for shingrix, please contact your pharmacy to discuss cost and to receive     Advanced directives: Please bring a copy of your advanced directives into the office so that we may scan it into your chart.  Conditions/risks identified: None   Next appointment: 05/08/2021 @ 2:45 PM with [place] for Medicare Wellness visit via Telephone   Preventive Care 65 Years and Older, Male Preventive care refers to lifestyle choices and visits with your health care provider that can promote health and wellness. What does preventive care include?  A yearly physical exam. This is also called an annual well check.  Dental exams once or twice a year.  Routine eye exams. Ask your health care provider how often you should have your eyes checked.  Personal lifestyle choices, including:  Daily care of your teeth and gums.  Regular physical activity.  Eating a healthy diet.  Avoiding tobacco and drug use.  Limiting alcohol use.  Practicing safe sex.  Taking low doses of aspirin every day.  Taking vitamin and mineral supplements as recommended by your health care provider. What happens during an annual well check? The services and screenings done by your health care provider during your annual well check will depend on your age, overall  health, lifestyle risk factors, and family history of disease. Counseling  Your health care provider may ask you questions about your:  Alcohol use.  Tobacco use.  Drug use.  Emotional well-being.  Home and relationship well-being.  Sexual activity.  Eating habits.  History of falls.  Memory and ability to understand (cognition).  Work and work Statistician. Screening  You may have the following tests or measurements:  Height, weight, and BMI.  Blood pressure.  Lipid and cholesterol levels. These may be checked every 5 years, or more frequently if you are over 85 years old.  Skin check.  Lung cancer screening. You may have this screening every year starting at age 3 if you have a 30-pack-year history of smoking and currently smoke or have quit within the past 15 years.  Fecal occult blood test (FOBT) of the stool. You may have this test every year starting at age 69.  Flexible sigmoidoscopy or colonoscopy. You may have a sigmoidoscopy every 5 years or a colonoscopy every 10 years starting at age 60.  Prostate cancer screening. Recommendations will vary depending on your family history and other risks.  Hepatitis C blood test.  Hepatitis B blood test.  Sexually transmitted disease (STD) testing.  Diabetes screening. This is done by checking your blood sugar (glucose) after you have not eaten for a while (fasting). You may have this done every 1-3 years.  Abdominal aortic aneurysm (AAA) screening. You may need this if you are a current or former smoker.  Osteoporosis. You may be screened starting at age 76 if you are at high risk. Talk with your health care provider about your  test results, treatment options, and if necessary, the need for more tests. Vaccines  Your health care provider may recommend certain vaccines, such as:  Influenza vaccine. This is recommended every year.  Tetanus, diphtheria, and acellular pertussis (Tdap, Td) vaccine. You may need a Td  booster every 10 years.  Zoster vaccine. You may need this after age 73.  Pneumococcal 13-valent conjugate (PCV13) vaccine. One dose is recommended after age 2.  Pneumococcal polysaccharide (PPSV23) vaccine. One dose is recommended after age 32. Talk to your health care provider about which screenings and vaccines you need and how often you need them. This information is not intended to replace advice given to you by your health care provider. Make sure you discuss any questions you have with your health care provider. Document Released: 09/30/2015 Document Revised: 05/23/2016 Document Reviewed: 07/05/2015 Elsevier Interactive Patient Education  2017 [place] Prevention in the Home Falls can cause injuries. They can happen to people of all ages. There are many things you can do to make your home safe and to help prevent falls. What can I do on the outside of my home?  Regularly fix the edges of walkways and driveways and fix any cracks.  Remove anything that might make you trip as you walk through a door, such as a raised step or threshold.  Trim any bushes or trees on the path to your home.  Use bright outdoor lighting.  Clear any walking paths of anything that might make someone trip, such as rocks or tools.  Regularly check to see if handrails are loose or broken. Make sure that both sides of any steps have handrails.  Any raised decks and porches should have guardrails on the edges.  Have any leaves, snow, or ice cleared regularly.  Use sand or salt on walking paths during winter.  Clean up any spills in your garage right away. This includes oil or grease spills. What can I do in the bathroom?  Use night lights.  Install grab bars by the toilet and in the tub and shower. Do not use towel bars as grab bars.  Use non-skid mats or decals in the tub or shower.  If you need to sit down in the shower, use a plastic, non-slip stool.  Keep the floor dry. Clean up  any water that spills on the floor as soon as it happens.  Remove soap buildup in the tub or shower regularly.  Attach bath mats securely with double-sided non-slip rug tape.  Do not have throw rugs and other things on the floor that can make you trip. What can I do in the bedroom?  Use night lights.  Make sure that you have a light by your bed that is easy to reach.  Do not use any sheets or blankets that are too big for your bed. They should not hang down onto the floor.  Have a firm chair that has side arms. You can use this for support while you get dressed.  Do not have throw rugs and other things on the floor that can make you trip. What can I do in the kitchen?  Clean up any spills right away.  Avoid walking on wet floors.  Keep items that you use a lot in easy-to-reach places.  If you need to reach something above you, use a strong step stool that has a grab bar.  Keep electrical cords out of the way.  Do not use floor polish or wax that  makes floors slippery. If you must use wax, use non-skid floor wax.  Do not have throw rugs and other things on the floor that can make you trip. What can I do with my stairs?  Do not leave any items on the stairs.  Make sure that there are handrails on both sides of the stairs and use them. Fix handrails that are broken or loose. Make sure that handrails are as long as the stairways.  Check any carpeting to make sure that it is firmly attached to the stairs. Fix any carpet that is loose or worn.  Avoid having throw rugs at the top or bottom of the stairs. If you do have throw rugs, attach them to the floor with carpet tape.  Make sure that you have a light switch at the top of the stairs and the bottom of the stairs. If you do not have them, ask someone to add them for you. What else can I do to help prevent falls?  Wear shoes that:  Do not have high heels.  Have rubber bottoms.  Are comfortable and fit you well.  Are  closed at the toe. Do not wear sandals.  If you use a stepladder:  Make sure that it is fully opened. Do not climb a closed stepladder.  Make sure that both sides of the stepladder are locked into place.  Ask someone to hold it for you, if possible.  Clearly mark and make sure that you can see:  Any grab bars or handrails.  First and last steps.  Where the edge of each step is.  Use tools that help you move around (mobility aids) if they are needed. These include:  Canes.  Walkers.  Scooters.  Crutches.  Turn on the lights when you go into a dark area. Replace any light bulbs as soon as they burn out.  Set up your furniture so you have a clear path. Avoid moving your furniture around.  If any of your floors are uneven, fix them.  If there are any pets around you, be aware of where they are.  Review your medicines with your doctor. Some medicines can make you feel dizzy. This can increase your chance of falling. Ask your doctor what other things that you can do to help prevent falls. This information is not intended to replace advice given to you by your health care provider. Make sure you discuss any questions you have with your health care provider. Document Released: 06/30/2009 Document Revised: 02/09/2016 Document Reviewed: 10/08/2014 Elsevier Interactive Patient Education  2017 [employer].

## 2020-05-10 ENCOUNTER — Ambulatory Visit (INDEPENDENT_AMBULATORY_CARE_PROVIDER_SITE_OTHER): Payer: Medicare Other | Admitting: Cardiovascular Disease

## 2020-05-10 ENCOUNTER — Other Ambulatory Visit: Payer: Self-pay

## 2020-05-10 ENCOUNTER — Encounter: Payer: Self-pay | Admitting: Cardiovascular Disease

## 2020-05-10 ENCOUNTER — Ambulatory Visit (INDEPENDENT_AMBULATORY_CARE_PROVIDER_SITE_OTHER): Payer: Medicare Other | Admitting: Pharmacist

## 2020-05-10 VITALS — BP 124/70 | HR 60 | Ht 73.0 in | Wt 194.4 lb

## 2020-05-10 DIAGNOSIS — I119 Hypertensive heart disease without heart failure: Secondary | ICD-10-CM | POA: Diagnosis not present

## 2020-05-10 DIAGNOSIS — I480 Paroxysmal atrial fibrillation: Secondary | ICD-10-CM | POA: Diagnosis not present

## 2020-05-10 DIAGNOSIS — Z7901 Long term (current) use of anticoagulants: Secondary | ICD-10-CM

## 2020-05-10 DIAGNOSIS — Z5181 Encounter for therapeutic drug level monitoring: Secondary | ICD-10-CM

## 2020-05-10 LAB — POCT INR: INR: 3 (ref 2.0–3.0)

## 2020-05-10 NOTE — Progress Notes (Signed)
Cardiology Office Note   Date:  05/10/2020   ID:  Francis Campbell., DOB Apr 02, 1946, MRN [id number]  PCP:  [name], MD  Cardiologist:   Skeet Latch, MD  Cardiology APP: [name], PA  No chief complaint on file.    History of Present Illness: Francis Campbell. is a 74 y.o. male with paroxysmal atrial fibrillation on coumadin, hypertension, hyperlipidemia, and Crohn's disease who presents for follow up.  Mr. Matters was previously a patient of [name].   [name] was evaluated by [name] for catheter ablation in 2014 and elected to use flecainide as a "pill in the pocket."  He underwent DCCV 04/2013.  He had a negative Myoview 09/2011 and an echo 10/2011 that revealed LVEF 60% with mild AR.  He noted increasing episodes of atrial fibrillation and started on daily flecainide 04/2016.  He developed bradycardia, so metoprolol was reduced.    [name] was seen in the ED 08/2017 with intermittent chest tightness.  His symptoms were felt to be atypical and EKG was unremarkable.  Troponin and chest x-ray were within normal limits.  He followed up in clinic and was referred for an exercise Myoview 09/2017 that was negative for ischemia.  He saw [name], [place], on 12/2017 after a one hour episode of atrial fibrillation.  He was instructed to take an extra dose of flecainide as needed. At his appointment 08/2018 he had an episode of recurrent atrial fibrillation.  He started taking flecainide daily and has done much better.  Since then he hasn't had any recurrent episodes.  He is active around the house with activities and yard work.  He feels well and has no chest pain or shortness of breath.  He has no edema, orthopnea, or PND. Other than his "honey-do" list he has no complaints.    Past Medical History:  Diagnosis Date  . Allergy   . Aortic regurgitation   . Arthritis   . Asthma    only as child  . Atrial flutter ([place])    a. Dx 09/2012.  . Cataract   . Crohn disease ([place])     Remotely - in the 1990s  . Crohn's colitis (Francis Campbell) 11/20/1984  . Esophageal reflux    Remotely  . History of kidney stones   . Hypercholesteremia   . Hypertension   . Paroxysmal atrial fibrillation ([facility])    Takes PRN flecainide  . 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  [name] TIA (transient ischemic attack)    ~2005 - had visual symptoms, was told by [name] this may have been a TIA but it's unclear  . Valvular heart disease    Mild AI, ASclerosis without AS by echo 10/2011    Past Surgical History:  Procedure Laterality Date  . CARDIOVERSION N/A 05/08/2013   Procedure: CARDIOVERSION;  Surgeon: Darlin Coco, MD;  Location: [facility] ENDOSCOPY;  Service: Cardiovascular;  Laterality: N/A;  . CATARACT EXTRACTION     Right 12/2019  . CATARACT EXTRACTION    . CYSTOSCOPY/RETROGRADE/URETEROSCOPY/STONE EXTRACTION WITH BASKET Right 02/12/2013   Procedure: CYSTOSCOPY/RETROGRADE/URETEROSCOPY/STONE EXTRACTION WITH BASKET, WITH  STENT PLACEMENT RIGHT URETERAL DILATION;  Surgeon: [name], MD;  Location: [facility] ORS;  Service: Urology;  Laterality: Right;  . EYE SURGERY Bilateral 1950, 1960, 1964   left, right, left  . HERNIA REPAIR  2005  . KNEE SURGERY Bilateral 1990, 2000  . LITHOTRIPSY    . ROTATOR CUFF  REPAIR Left 2000  . TONSILLECTOMY       Current Outpatient Medications  Medication Sig Dispense Refill  . ALPRAZolam (XANAX) 0.25 MG tablet TAKE 1 TAB AT BEDTIME AS NEEDED FOR ANXIETY OR SLEEP. 30 tablet 3  . Cholecalciferol (VITAMIN D3) 1000 UNITS CAPS Take 2,000 Units by mouth every morning.     . diltiazem (CARTIA XT) 240 MG 24 hr capsule Take 240 mg by mouth in the morning 90 capsule 3  . flecainide (TAMBOCOR) 100 MG tablet Take 1 tablet (100 mg total) by mouth 2 (two) times daily. 180 tablet 2  . losartan (COZAAR) 25 MG tablet TAKE 1 TABLET EVERY DAY 90 tablet 1  . metoprolol succinate (TOPROL-XL) 25 MG 24 hr tablet Take 1 tablet (25 mg total) by mouth daily. Take  with or immediately following a meal. 90 tablet 3  . Multiple Vitamin (MULTIVITAMIN) tablet Take 1 tablet by mouth daily.    . potassium citrate (UROCIT-K) 10 MEQ (1080 MG) SR tablet Take 10 mEq by mouth 3 (three) times daily with meals.   11  . rosuvastatin (CRESTOR) 20 MG tablet Take 1 tablet (20 mg total) by mouth daily. TAKE 1 TABLET EVERY DAY 90 tablet 3  . sertraline (ZOLOFT) 50 MG tablet TAKE 1 TABLET (50 MG TOTAL) BY MOUTH DAILY. NEEDS OFFICE VISIT FOR FURTHER REFILLS 90 tablet 1  . Turmeric 500 MG CAPS Take 500 mg by mouth daily.    [name] warfarin (COUMADIN) 5 MG tablet TAKE 1 TO 1.5 TABLETS DAILY AS  DIRECTED BY COUMADIN CLINIC 135 tablet 1   No current facility-administered medications for this visit.    Allergies:   Chocolate, Chocolate flavor, Dextroamphetamine, Dextromethorphan, Flomax [tamsulosin hcl], Floxin [ofloxacin], Guaifenesin, Lisinopril, Tavist allergy [clemastine fumarate], Clemastine, Robitussin cold+flu daytime  [dm-phenylephrine-acetaminophen], Tamsulosin, Amiodarone, and Robitussin cf [pseudoephedrine-dm-gg]    Social History:  The patient  reports that he has never smoked. He has never used smokeless tobacco. He reports that he does not drink alcohol and does not use drugs.   Family History:  The patient's family history includes Appendicitis in his son; Arthritis in his paternal grandmother; Heart disease in his father and paternal grandmother; Hypertension in his mother; Kidney cancer in his son; Kidney disease in his mother; Leukemia in his father; Nephrolithiasis in his brother and father; Stroke in his mother.    ROS:  Please see the history of present illness.   Otherwise, review of systems are positive for none.   All other systems are reviewed and negative.    PHYSICAL EXAM: VS:  BP 124/70   Pulse 60   Ht 6' 1"  (1.854 m)   Wt 194 lb 6.4 oz (88.2 kg)   SpO2 98%   BMI 25.65 kg/m  , BMI Body mass index is 25.65 kg/m. GENERAL:  Well appearing HEENT:  Pupils equal round and reactive, fundi not visualized, oral mucosa unremarkable NECK:  No jugular venous distention, waveform within normal limits, carotid upstroke brisk and symmetric, no bruits LUNGS:  Clear to auscultation bilaterally HEART:  RRR.  PMI not displaced or sustained,S1 and S2 within normal limits, no S3, no S4, no clicks, no rubs, no murmurs ABD:  Flat, positive bowel sounds normal in frequency in pitch, no bruits, no rebound, no guarding, no midline pulsatile mass, no hepatomegaly, no splenomegaly EXT:  2 plus pulses throughout, no edema, no cyanosis no clubbing SKIN:  No rashes no nodules NEURO:  Cranial nerves II through XII grossly intact, motor grossly intact  throughout [facility]:  Cognitively intact, oriented to person place and time  EKG:  EKG is ordered today. The ekg ordered 06/26/16 demonstrates sinus bradycardia rate 52.  06/04/17: Sinus bradycardia.  Rate 48 bpm. 08/27/18: Sinus rhythm.  Rate 62 bpm.   07/14/19: Sinus rhythm.  Rate 60 bpm.  05/10/2020: Sinus rhythm.  Rate 60 bpm.  Incomplete right bundle branch block.  Echo 01/03/18: Study Conclusions  - Left ventricle: The cavity size was normal. Wall thickness was   increased in a pattern of mild LVH. Systolic function was normal.   The estimated ejection fraction was in the range of 60% to 65%.   Wall motion was normal; there were no regional wall motion   abnormalities. Left ventricular diastolic function parameters   were normal. - Aortic valve: Dilated aortic sinus. There was mild regurgitation. - Atrial septum: No defect or patent foramen ovale was identified. - Pulmonary arteries: PA peak pressure: 31 mm Hg (S).  Exercise Myoview 09/25/17:   The patient walked for a total of 10 minutes and 15 seconds under standard Bruce protocol treadmill test. The peak heart rate is 142 which is 96% predicted maximal heart rate.  There were no ST or T wave changes to suggest ischemia.  Nuclear stress EF: 61%. The  left ventricular ejection fraction is normal (55-65%).  This is a low risk study. The study is normal.  Recent Labs: 08/26/2019: Magnesium 2.2 11/21/2019: ALT 24; BUN 23; Creatinine, Ser 0.88; Hemoglobin 14.4; Platelets 174; Potassium 3.7; Sodium 142    Lipid Panel    Component Value Date/Time   CHOL 165 11/12/2019 0940   TRIG 171 (H) 11/12/2019 0940   HDL 43 11/12/2019 0940   CHOLHDL 3.8 11/12/2019 0940   CHOLHDL 5 10/07/2017 1510   VLDL 58.4 (H) 10/07/2017 1510   LDLCALC 92 11/12/2019 0940   LDLDIRECT 96.0 10/07/2017 1510      Wt Readings from Last 3 Encounters:  05/10/20 194 lb 6.4 oz (88.2 kg)  02/29/20 193 lb 1.6 oz (87.6 kg)  02/17/20 192 lb (87.1 kg)     ASSESSMENT AND PLAN:  # Paroxysmal atrial fibrillation:  # Bradycardia: Mr. Vizcarrondo remains in sinus rhythm on flecainide.  QTC was 466 ms today.  Continue warfarin, metoprolol, and flecainide.  This patients CHA2DS2-VASc Score and unadjusted Ischemic Stroke Rate (% per year) is equal to 2.2 % stroke rate/year from a score of 2  Above score calculated as 1 point each if present [CHF, HTN, DM, Vascular=MI/PAD/Aortic Plaque, Age if 65-74, or Male] Above score calculated as 2 points each if present [Age > 75, or Stroke/TIA/TE]  # Atypical chest pain: Resolved. Exercise Myoview was negative for ischemia.    # Hypertension: Blood pressure well-controlled on diltiazem, metoprolol, and losartan.  # Hyperlipidemia:  Continue rosuvastatin.  Triglycerides are mildly elevated.  He was instructed to watch his carbohydrate intake and increase his exercise.  # Aortic valve sclerosis: Unchanged on echo 12/2017.  No evidence of heart failure on exam.   Current medicines are reviewed at length with the patient today.  The patient does not have concerns regarding medicines.  The following changes have been made:  none  Labs/ tests ordered today include:   No orders of the defined types were placed in this  encounter.    Disposition:   FU with Mickala Laton C. Oval Linsey, MD, [facility] in 1 year     Signed, Cali Hope C. Oval Linsey, [place], [facility]  05/10/2020 9:02 AM    [place]  [facility]

## 2020-05-10 NOTE — Patient Instructions (Signed)
Medication Instructions:  Your physician recommends that you continue on your current medications as directed. Please refer to the Current Medication list given to you today.  *If you need a refill on your cardiac medications before your next appointment, please call your pharmacy*  Lab Work: NONE  Testing/Procedures: NONE  Follow-Up: At [facility], you and your health needs are our priority.  As part of our continuing mission to provide you with exceptional heart care, we have created designated Provider Care Teams.  These Care Teams include your primary Cardiologist (physician) and Advanced Practice Providers (APPs -  Physician Assistants and Nurse Practitioners) who all work together to provide you with the care you need, when you need it.  We recommend signing up for the patient portal called "MyChart".  Sign up information is provided on this After Visit Summary.  MyChart is used to connect with patients for Virtual Visits (Telemedicine).  Patients are able to view lab/test results, encounter notes, upcoming appointments, etc.  Non-urgent messages can be sent to your provider as well.   To learn more about what you can do with MyChart, go to [email or web address].    Your next appointment:   12 month(s)  You will receive a reminder letter in the mail two months in advance. If you don't receive a letter, please call our office to schedule the follow-up appointment.  The format for your next appointment:   In Person  Provider:   You may see Skeet Latch, MD or one of the following Advanced Practice Providers on your designated Care Team:    Kerin Ransom, PA-C  [place], [place]  Coletta Memos, [place]

## 2020-05-10 NOTE — Patient Instructions (Signed)
Description   Continue taking 51m daily except 7.542mevery Monday, Wednesday and Friday. Recheck INR in 5 weeks

## 2020-05-17 ENCOUNTER — Other Ambulatory Visit: Payer: Self-pay

## 2020-05-17 ENCOUNTER — Ambulatory Visit (INDEPENDENT_AMBULATORY_CARE_PROVIDER_SITE_OTHER): Payer: Medicare Other | Admitting: Otolaryngology

## 2020-05-17 ENCOUNTER — Encounter (INDEPENDENT_AMBULATORY_CARE_PROVIDER_SITE_OTHER): Payer: Self-pay | Admitting: Otolaryngology

## 2020-05-17 VITALS — Temp 97.2°F

## 2020-05-17 DIAGNOSIS — H60311 Diffuse otitis externa, right ear: Secondary | ICD-10-CM | POA: Diagnosis not present

## 2020-05-17 NOTE — Addendum Note (Signed)
Addended by: Zebedee Iba on: 05/17/2020 10:37 AM   Modules accepted: Orders

## 2020-05-17 NOTE — Progress Notes (Signed)
HPI: Francis Campbell. is a 74 y.o. male who returns today for evaluation of right earache that he has had for the past 2 weeks.  Pain radiates down his neck..  Denies swimming or getting a lot of water in his ears.  Past Medical History:  Diagnosis Date  . Allergy   . Aortic regurgitation   . Arthritis   . Asthma    only as child  . Atrial flutter ([place])    a. Dx 09/2012.  . Cataract   . Crohn disease ([place])    Remotely - in the 1990s  . Crohn's colitis ([place]) 11/20/1984  . Esophageal reflux    Remotely  . History of kidney stones   . Hypercholesteremia   . Hypertension   . Paroxysmal atrial fibrillation (HCC)    Takes PRN flecainide  . 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  [name] TIA (transient ischemic attack)    ~2005 - had visual symptoms, was told by [name] this may have been a TIA but it's unclear  . Valvular heart disease    Mild AI, ASclerosis without AS by echo 10/2011   Past Surgical History:  Procedure Laterality Date  . CARDIOVERSION N/A 05/08/2013   Procedure: CARDIOVERSION;  Surgeon: Darlin Coco, MD;  Location: [facility] ENDOSCOPY;  Service: Cardiovascular;  Laterality: N/A;  . CATARACT EXTRACTION     Right 12/2019  . CATARACT EXTRACTION    . CYSTOSCOPY/RETROGRADE/URETEROSCOPY/STONE EXTRACTION WITH BASKET Right 02/12/2013   Procedure: CYSTOSCOPY/RETROGRADE/URETEROSCOPY/STONE EXTRACTION WITH BASKET, WITH  STENT PLACEMENT RIGHT URETERAL DILATION;  Surgeon: [name], MD;  Location: [facility] ORS;  Service: Urology;  Laterality: Right;  . EYE SURGERY Bilateral 1950, 1960, 1964   left, right, left  . HERNIA REPAIR  2005  . KNEE SURGERY Bilateral 1990, 2000  . LITHOTRIPSY    . ROTATOR CUFF REPAIR Left 2000  . TONSILLECTOMY     Social History   Socioeconomic History  . Marital status: Married    Spouse name: Not on file  . Number of children: 2  . Years of education: Not on file  . Highest education level: Not on file  Occupational  History  . Occupation: TECHNICAL SUP    Employer: [employer]  Tobacco Use  . Smoking status: Never Smoker  . Smokeless tobacco: Never Used  Vaping Use  . Vaping Use: Never used  Substance and Sexual Activity  . Alcohol use: No  . Drug use: No  . Sexual activity: Yes    Birth control/protection: None  Other Topics Concern  . Not on file  Social History Narrative   Non smoker  No ets          Social Determinants of Health   Financial Resource Strain: Low Risk   . Difficulty of Paying Living Expenses: Not hard at all  Food Insecurity: No Food Insecurity  . Worried About Charity fundraiser in the Last Year: Never true  . Ran Out of Food in the Last Year: Never true  Transportation Needs: No Transportation Needs  . Lack of Transportation (Medical): No  . Lack of Transportation (Non-Medical): No  Physical Activity: Inactive  . Days of Exercise per Week: 0 days  . Minutes of Exercise per Session: 0 min  Stress: No Stress Concern Present  . Feeling of Stress : Not at all  Social Connections: Socially Integrated  . Frequency of Communication with Friends and Family: More than three times a week  .  Frequency of Social Gatherings with Friends and Family: More than three times a week  . Attends Religious Services: More than 4 times per year  . Active Member of Clubs or Organizations: Yes  . Attends Archivist Meetings: More than 4 times per year  . Marital Status: Married   Family History  Problem Relation Age of Onset  . Stroke Mother   . Hypertension Mother   . Kidney disease Mother        stones  . Nephrolithiasis Brother   . Arthritis Paternal Grandmother   . Heart disease Paternal Grandmother   . Leukemia Father   . Heart disease Father   . Nephrolithiasis Father   . Kidney cancer Son   . Appendicitis Son   . Heart attack Neg Hx   . Colon cancer Neg Hx   . Esophageal cancer Neg Hx   . Pancreatic cancer Neg Hx   . Stomach cancer Neg Hx   . Liver disease  Neg Hx    Allergies  Allergen Reactions  . Chocolate Palpitations  . Chocolate Flavor Shortness Of Breath    Other reaction(s): Respiratory Distress  . Dextroamphetamine Shortness Of Breath    Other reaction(s): Respiratory Distress  . Dextromethorphan Palpitations  . Flomax [Tamsulosin Hcl] Other (See Comments)    ? Causes A Fib  . Floxin [Ofloxacin] Other (See Comments)    hallucinations  . Guaifenesin Shortness Of Breath  . Lisinopril Cough  . Tavist Allergy [Clemastine Fumarate] Other (See Comments)    A fib worse  . Clemastine     Other reaction(s): Irregular Heart Rate  . Robitussin Cold+Flu Daytime  [Dm-Phenylephrine-Acetaminophen]     Other reaction(s): Respiratory Distress  . Tamsulosin Other (See Comments)    Other reaction(s): Irregular Heart Rate  . Amiodarone Other (See Comments)    Abn  thyroid  . Robitussin Cf [Pseudoephedrine-Dm-Gg] Palpitations   Prior to Admission medications   Medication Sig Start Date End Date Taking? Authorizing Provider  ALPRAZolam (XANAX) 0.25 MG tablet TAKE 1 TAB AT BEDTIME AS NEEDED FOR ANXIETY OR SLEEP. 04/18/20  Yes Burchette, Alinda Sierras, MD  Cholecalciferol (VITAMIN D3) 1000 UNITS CAPS Take 2,000 Units by mouth every morning.    Yes [provider]  diltiazem (CARTIA XT) 240 MG 24 hr capsule Take 240 mg by mouth in the morning 12/31/19  Yes Skeet Latch, MD  flecainide (TAMBOCOR) 100 MG tablet Take 1 tablet (100 mg total) by mouth 2 (two) times daily. 11/12/19  Yes Skeet Latch, MD  losartan (COZAAR) 25 MG tablet TAKE 1 TABLET EVERY DAY 03/10/20  Yes Skeet Latch, MD  metoprolol succinate (TOPROL-XL) 25 MG 24 hr tablet Take 1 tablet (25 mg total) by mouth daily. Take with or immediately following a meal. 11/12/19  Yes Skeet Latch, MD  Multiple Vitamin (MULTIVITAMIN) tablet Take 1 tablet by mouth daily.   Yes [provider]  potassium citrate (UROCIT-K) 10 MEQ (1080 MG) SR tablet Take 10 mEq by mouth 3  (three) times daily with meals.  11/17/15  Yes [provider]  rosuvastatin (CRESTOR) 20 MG tablet Take 1 tablet (20 mg total) by mouth daily. TAKE 1 TABLET EVERY DAY 11/12/19  Yes Skeet Latch, MD  sertraline (ZOLOFT) 50 MG tablet TAKE 1 TABLET (50 MG TOTAL) BY MOUTH DAILY. NEEDS OFFICE VISIT FOR FURTHER REFILLS 04/15/20  Yes Burchette, Alinda Sierras, MD  Turmeric 500 MG CAPS Take 500 mg by mouth daily.   Yes [provider]  warfarin (  COUMADIN) 5 MG tablet TAKE 1 TO 1.5 TABLETS DAILY AS  DIRECTED BY COUMADIN CLINIC 04/11/20  Yes Skeet Latch, MD  digoxin (LANOXIN) 0.125 MG tablet Take 1 tablet (125 mcg total) by mouth daily. 11/26/11 11/27/11  Darlin Coco, MD  losartan-hydrochlorothiazide [facility]) 100-12.5 MG per tablet  08/26/11 11/27/11  [provider]  pantoprazole (PROTONIX) 40 MG tablet Take 1 tablet (40 mg total) by mouth daily. 10/29/11 11/27/11  Sable Feil, MD     Positive ROS: Otherwise negative  All other systems have been reviewed and were otherwise negative with the exception of those mentioned in the HPI and as above.  Physical Exam: Constitutional: Alert, well-appearing, no acute distress Ears: External ears without lesions or tenderness.  Left ear canal left TM are clear.  Right ear canal reveals inflammatory changes and a small amount of drainage that was cleaned with suction.  After cleaning the ear canal I applied gentian violet, Ciprodex and CSF powder to the right ear. Nasal: External nose without lesions. Clear nasal passages Oral: Lips and gums without lesions. Tongue and palate mucosa without lesions. Posterior oropharynx clear. Neck: No palpable adenopathy or masses Respiratory: Breathing comfortably  Skin: No facial/neck lesions or rash noted.  Cerumen impaction removal  Date/Time: 05/17/2020 6:32 PM Performed by: Rozetta Nunnery, MD Authorized by: Rozetta Nunnery, MD   Consent:    Consent obtained:  Verbal    Consent given by:  Patient   Risks discussed:  Pain and bleeding Procedure details:    Location:  R ear   Procedure type: suction   Post-procedure details:    Inspection:  TM intact and canal normal   Hearing quality:  Improved   Patient tolerance of procedure:  Tolerated well, no immediate complications Comments:     After cleaning the right ear canal I applied gentian violet, Ciprodex and CSF powder to the right ear.    Assessment: Right external otitis  Plan: I cleaned the ear canal in the office today and applied gentian violet, Ciprodex and CSF powder to the right ear. Recommended keeping it dry and gave her a prescription for Ciprodex drops to use if he has any further pain after 2 days. He will follow-up as needed   Radene Journey, MD

## 2020-05-20 ENCOUNTER — Ambulatory Visit (INDEPENDENT_AMBULATORY_CARE_PROVIDER_SITE_OTHER): Payer: Medicare Other | Admitting: Otolaryngology

## 2020-06-13 ENCOUNTER — Other Ambulatory Visit: Payer: Self-pay

## 2020-06-13 ENCOUNTER — Encounter: Payer: Self-pay | Admitting: Family Medicine

## 2020-06-13 ENCOUNTER — Ambulatory Visit (INDEPENDENT_AMBULATORY_CARE_PROVIDER_SITE_OTHER): Payer: Medicare Other

## 2020-06-13 DIAGNOSIS — Z23 Encounter for immunization: Secondary | ICD-10-CM | POA: Diagnosis not present

## 2020-06-13 NOTE — Progress Notes (Signed)
Patient given flu vaccine in right deltoid per orders of Dr. Elease Hashimoto.  Patient tolerated the injection well.

## 2020-06-14 DIAGNOSIS — Z23 Encounter for immunization: Secondary | ICD-10-CM | POA: Diagnosis not present

## 2020-06-15 ENCOUNTER — Other Ambulatory Visit: Payer: Self-pay

## 2020-06-15 ENCOUNTER — Ambulatory Visit (INDEPENDENT_AMBULATORY_CARE_PROVIDER_SITE_OTHER): Payer: Medicare Other

## 2020-06-15 DIAGNOSIS — Z5181 Encounter for therapeutic drug level monitoring: Secondary | ICD-10-CM | POA: Diagnosis not present

## 2020-06-15 DIAGNOSIS — I480 Paroxysmal atrial fibrillation: Secondary | ICD-10-CM

## 2020-06-15 LAB — POCT INR: INR: 2.7 (ref 2.0–3.0)

## 2020-06-15 NOTE — Patient Instructions (Addendum)
Continue taking 76m daily except 7.56mevery Monday, Wednesday and Friday. Recheck INR in 6 weeks

## 2020-06-22 ENCOUNTER — Telehealth: Payer: Self-pay | Admitting: Family Medicine

## 2020-06-22 NOTE — Progress Notes (Signed)
  Chronic Care Management   Note  06/22/2020 Name: Francis Campbell. MRN: [id number] DOB: 1946/03/08  Francis Campbell. is a 74 y.o. year old male who is a primary care patient of Burchette, Alinda Sierras, MD. I reached out to [employer]. by phone today in response to a referral sent by Mr. Francis Kauffmann Oshiro Jr.'s PCP, Elease Hashimoto, Alinda Sierras, MD.   Francis Campbell was given information about Chronic Care Management services today including:  1. CCM service includes personalized support from designated clinical staff supervised by his physician, including individualized plan of care and coordination with other care providers 2. 24/7 contact phone numbers for assistance for urgent and routine care needs. 3. Service will only be billed when office clinical staff spend 20 minutes or more in a month to coordinate care. 4. Only one practitioner may furnish and bill the service in a calendar month. 5. The patient may stop CCM services at any time (effective at the end of the month) by phone call to the office staff.   Patient agreed to services and verbal consent obtained.   Follow up plan:   Carley Perdue [employer] Scheduler

## 2020-07-11 ENCOUNTER — Other Ambulatory Visit: Payer: Self-pay | Admitting: Urology

## 2020-07-26 DIAGNOSIS — M25571 Pain in right ankle and joints of right foot: Secondary | ICD-10-CM | POA: Diagnosis not present

## 2020-07-26 DIAGNOSIS — S93401A Sprain of unspecified ligament of right ankle, initial encounter: Secondary | ICD-10-CM | POA: Diagnosis not present

## 2020-07-27 ENCOUNTER — Ambulatory Visit (INDEPENDENT_AMBULATORY_CARE_PROVIDER_SITE_OTHER): Payer: Medicare Other

## 2020-07-27 DIAGNOSIS — I480 Paroxysmal atrial fibrillation: Secondary | ICD-10-CM | POA: Diagnosis not present

## 2020-07-27 DIAGNOSIS — Z5181 Encounter for therapeutic drug level monitoring: Secondary | ICD-10-CM

## 2020-07-27 LAB — POCT INR: INR: 2.5 (ref 2.0–3.0)

## 2020-07-27 NOTE — Patient Instructions (Signed)
Continue taking 54m daily except 7.574mevery Monday, Wednesday and Friday. Recheck INR in 6 weeks

## 2020-08-04 DIAGNOSIS — M7712 Lateral epicondylitis, left elbow: Secondary | ICD-10-CM | POA: Diagnosis not present

## 2020-08-04 DIAGNOSIS — S53432A Radial collateral ligament sprain of left elbow, initial encounter: Secondary | ICD-10-CM | POA: Diagnosis not present

## 2020-08-14 ENCOUNTER — Other Ambulatory Visit: Payer: Self-pay | Admitting: Family Medicine

## 2020-08-23 ENCOUNTER — Telehealth: Payer: Self-pay

## 2020-08-23 DIAGNOSIS — I119 Hypertensive heart disease without heart failure: Secondary | ICD-10-CM

## 2020-08-23 DIAGNOSIS — E7800 Pure hypercholesterolemia, unspecified: Secondary | ICD-10-CM

## 2020-08-23 NOTE — Telephone Encounter (Signed)
-----   Message from Viona Gilmore, [facility] sent at 08/23/2020  4:48 PM EST ----- Regarding: CCM referral Hi Ivanell Deshotel,  Can you please place a CCM referral for one of Dr. Erick Blinks patients, Mr. Rithik Odea?  Thank you so much! Best, Maddie

## 2020-08-24 ENCOUNTER — Ambulatory Visit: Payer: Medicare Other | Admitting: Pharmacist

## 2020-08-24 DIAGNOSIS — I480 Paroxysmal atrial fibrillation: Secondary | ICD-10-CM

## 2020-08-24 DIAGNOSIS — F5104 Psychophysiologic insomnia: Secondary | ICD-10-CM

## 2020-08-24 NOTE — Chronic Care Management (AMB) (Signed)
Chronic Care Management Pharmacy  Name: Francis Campbell.  MRN: [id number] DOB: [birth year]  Initial Planning Appointment: n/a  Initial Questions: 1. Have you seen any other providers since your last visit? n/a 2. Any changes in your medicines or health? No   Chief Complaint/ HPI  Francis Pauls.,  74 y.o. , male presents for their Initial CCM visit with the clinical pharmacist via telephone due to COVID-19 Pandemic.  PCP : [name], MD  Their chronic conditions include: HTN, HLD, Afib, depression/anixety, insomnia  Office Visits: -06/13/20 Patient received influenza vaccine in office.  -05/04/20 Ofilia Neas, LPN: Patient presented for medicare annual wellness exam.  -02/29/20 [name], MD; Patient presented for chronic conditions follow up. Medications refilled. No changes made.  Consult Visit: -08/04/20 [facility]): Patient presented for elbow sprain. Unable to access notes.  -07/27/20 Frederik Schmidt, RN (Cardiology): Patient presented for anti-coag visit. INR 2.5, goal 2-3. Warfarin dose 7.5 mg on Mon and Fri and 5 mg on all other days.  -05/17/20 Melony Overly (otolaryngology): Patient presented for cerumen impaction removal.  -05/10/20 Skeet Latch, MD (cardiology): Patient presented for Afib follow up. No changes made.  -02/17/20 Tye Savoy, NP (gastro): Patient presented for constipation follow up. Recommended Miralax as needed.  -02/03/20 Irine Seal (urology): Patient presented for follow up. Unable to access notes.  Medications: Outpatient Encounter Medications as of 08/24/2020  Medication Sig  . ALPRAZolam (XANAX) 0.25 MG tablet TAKE 1 TABLET BY MOUTH AT BEDTIME AS NEEDED FOR ANXIETY OR SLEEP.  [name] Cholecalciferol (VITAMIN D3) 1000 UNITS CAPS Take 2,000 Units by mouth every morning.   . diltiazem (CARTIA XT) 240 MG 24 hr capsule Take 240 mg by mouth in the morning  . flecainide (TAMBOCOR) 100 MG tablet Take 1 tablet (100 mg  total) by mouth 2 (two) times daily.  [name] losartan (COZAAR) 25 MG tablet TAKE 1 TABLET EVERY DAY  . metoprolol succinate (TOPROL-XL) 25 MG 24 hr tablet Take 1 tablet (25 mg total) by mouth daily. Take with or immediately following a meal.  . Multiple Vitamin (MULTIVITAMIN) tablet Take 1 tablet by mouth daily.  . potassium citrate (UROCIT-K) 10 MEQ (1080 MG) SR tablet TAKE 1 TABLET THREE TIMES DAILY  . rosuvastatin (CRESTOR) 20 MG tablet Take 1 tablet (20 mg total) by mouth daily. TAKE 1 TABLET EVERY DAY  . sertraline (ZOLOFT) 50 MG tablet TAKE 1 TABLET (50 MG TOTAL) BY MOUTH DAILY. NEEDS OFFICE VISIT FOR FURTHER REFILLS  . Turmeric 500 MG CAPS Take 500 mg by mouth daily.  [name] warfarin (COUMADIN) 5 MG tablet TAKE 1 TO 1.5 TABLETS DAILY AS  DIRECTED BY COUMADIN CLINIC  . [DISCONTINUED] digoxin (LANOXIN) 0.125 MG tablet Take 1 tablet (125 mcg total) by mouth daily.  . [DISCONTINUED] losartan-hydrochlorothiazide (HYZAAR) 100-12.5 MG per tablet   . [DISCONTINUED] pantoprazole (PROTONIX) 40 MG tablet Take 1 tablet (40 mg total) by mouth daily.   No facility-administered encounter medications on file as of 08/24/2020.   Patient lives with 1 cat and 1 dog inside and his wife. They also feed about 5 cats in the morning and 10 cats at dinner that live outside nearby. He has some family nearby in [place] and some in [place] and he just saw his son in [place] for Thanksgiving.   He typically eats meat with two or three veggies and mostly from scatch. He doesn't eat processesed food. He eats chicken and some pork and beef and eats fish out  at restaurants. He eats out pretty much every day and eats 3 meals per day. He doesn't work out at [employer] for exercise but says that he is active 6 out of 7 days a week.  He does admit to some problems with sleep and gets up about 2 times for bathroom trips during the night. When he goes back to bed, it's like he's starting all over trying to sleep. He goes to bed at midnight  and gets up at 7:30-8. He is not sleepy during the day and does not take naps intentionally  Patient reports that all medicines are working great and does not have a fear of taking any of them.   Current Diagnosis/Assessment:  Goals Addressed            This Visit's Progress   . Pharmacy Care Plan       CARE PLAN ENTRY (see longitudinal plan of care for additional care plan information)  Current Barriers:  . Chronic Disease Management support, education, and care coordination needs related to Hypertension, Hyperlipidemia, Atrial Fibrillation, Depression, Anxiety, and insomnia   Hypertension BP Readings from Last 3 Encounters:  05/10/20 124/70  02/29/20 120/62  02/17/20 124/80   . Pharmacist Clinical Goal(s): o Over the next 180 days, patient will work with PharmD and providers to maintain BP goal <140/90 . Current regimen:  . Losartan 25 mg 1 tablet daily   . Diltiazem 240 mg 1 capsule in the morning . Metoprolol succinate 25 mg 1 tablet daily . Interventions: o Discussed DASH eating plan recommendations: . Emphasizes vegetables, fruits, and whole-grains . Includes fat-free or low-fat dairy products, fish, poultry, beans, nuts, and vegetable oils . Limits foods that are high in saturated fat. These foods include fatty meats, full-fat dairy products, and tropical oils such as coconut, palm kernel, and palm oils. . Limits sugar-sweetened beverages and sweets . Limiting sodium intake to < 1500 mg/day . Patient self care activities - Over the next 180 days, patient will: o Check blood pressure weekly, document, and provide at future appointments o Ensure daily salt intake < 2300 mg/day o Move losartan to the evening  Hyperlipidemia Lab Results  Component Value Date/Time   LDLCALC 92 11/12/2019 09:40 AM   LDLDIRECT 96.0 10/07/2017 03:10 PM   . Pharmacist Clinical Goal(s): o Over the next 180 days, patient will work with PharmD and providers to maintain LDL goal <  100 . Current regimen:  o Rosuvastatin 20 mg 1 tablet daily . Interventions: o Discussed how triglycerides can increase when: . Eating eat too much food . Eating high-fat foods such as fried foods, red meat, chicken skin, egg yolks, high-fat dairy, butter, lard, shortening, margarine, and fast food . Eating foods high in simple carbohydrates such as fresh and canned fruit, candy, ice cream and sweetened yogurt, sweetened drinks like juices, cereal, jams, foods and drinks with corn syrup, or sugar listed as the first ingredient . Drinking alcohol   . Patient self care activities - Over the next 180 days, patient will: o Continue current medications  Afib Pulse Readings from Last 3 Encounters:  05/10/20 60  02/29/20 60  02/17/20 60   . Pharmacist Clinical Goal(s): o Over the next 180 days, patient will work with PharmD and providers to maintain heart rate below 110 beats per minute . Current regimen:  . Diltiazem 240 mg 1 capsule in the morning . Metoprolol succinate 25 mg 1 tablet daily in morning  . Flecainide 100 mg 1 tablet  twice daily . Warfarin 5 mg, 7.5 mg on Mon, Wed and Fri and 5 mg on all other days . Interventions: o Discussed monitoring heart rate along with blood pressure o Discussed monitoring for signs of bleeding such as unexplained and excessive bleeding from a cut or injury, easy or excessive bruising, blood in urine or stools, and nosebleeds without a known cause o Educated on increased bleed risk while taking turmeric with warfarin and consider decreasing dose or finding a safer alternative . Patient self care activities - Over the next 180 days, patient will: o Check heart rate, document, and provide at future appointments o Contact provider with any episodes of bleeding  Depression/anxiety . Pharmacist Clinical Goal(s) o Over the next 180 days, patient will work with PharmD and providers to manage symptoms of depression/anxiety . Current regimen:  o Sertraline  50 mg 1 tablet daily . Patient self care activities - Over the next 180 days, patient will: o Continue current medication  Insomnia . Pharmacist Clinical Goal(s) o Over the next 180 days, patient will work with PharmD and providers to improve sleep . Current regimen:  o Alprazolam 0.25 mg 1 tablet at bedtime as needed . Interventions: o We discussed long term risks of taking benzodiazepines such as increased risk for bone fractures, falls, and memory loss o Discussed practicing good sleep hygiene by setting a sleep schedule and maintaining it, avoid excessive napping, following a nightly routine, avoiding screen time for 30-60 minutes before going to bed, and making the bedroom a cool, quiet and dark space . Patient self care activities - Over the next 180 days, patient will: o Continue current medication  Medication management . Pharmacist Clinical Goal(s): o Over the next 180 days, patient will work with PharmD and providers to maintain optimal medication adherence . [facility] . Interventions o Comprehensive medication review performed. o Continue current medication management strategy . Patient self care activities - Over the next 180 days, patient will: o Take medications as prescribed o Report any questions or concerns to PharmD and/or provider(s)  Initial goal documentation       SDOH Interventions   Flowsheet Row Most Recent Value  SDOH Interventions   Financial Strain Interventions Intervention Not Indicated  Transportation Interventions Intervention Not Indicated       AFIB   Patient is currently rate controlled. Office heart rates are  Pulse Readings from Last 3 Encounters:  05/10/20 60  02/29/20 60  02/17/20 60   Checks HR: 61   Every 2 weeks  CHA2DS2-VASc Score =   2 The patient's score is based upon: age, HTN    Patient has failed these meds in past: none Patient is currently controlled on the following medications:   . Diltiazem 240 mg 1 capsule in the morning . Metoprolol succinate 25 mg 1 tablet daily in morning  . Flecainide 100 mg 1 tablet twice daily . Warfarin 5 mg, 7.5 mg on Mon, Wed and Fri and 5 mg on all other days  We discussed:  monitoring HR along with BP; monitoring for signs of bleeding such as unexplained and excessive bleeding from a cut or injury, easy or excessive bruising, blood in urine or stools, and nosebleeds without a known cause   Plan  Continue current medications   Hypertension   BP goal is:  <140/90  Office blood pressures are  BP Readings from Last 3 Encounters:  05/10/20 124/70  02/29/20 120/62  02/17/20 124/80   Patient checks  BP at home several times per month Patient home BP readings are ranging: 145/74  Patient has failed these meds in the past: none Patient is currently controlled on the following medications:  . Losartan 25 mg 1 tablet daily   . Diltiazem 240 mg 1 capsule in the morning . Metoprolol succinate 25 mg 1 tablet daily - in AM  We discussed diet and exercise extensively -DASH eating plan recommendations: . Emphasizes vegetables, fruits, and whole-grains . Includes fat-free or low-fat dairy products, fish, poultry, beans, nuts, and vegetable oils . Limits foods that are high in saturated fat. These foods include fatty meats, full-fat dairy products, and tropical oils such as coconut, palm kernel, and palm oils. . Limits sugar-sweetened beverages and sweets . Limiting sodium intake to < 1500 mg/day -Recommended looking at package labels (grabs low sodium)  Plan Recommended moving losartan to evening to spread out BP medications - plan for BP assessment in 1-2 months. Continue current medications    Hyperlipidemia   LDL goal < 100  Last lipids Lab Results  Component Value Date   CHOL 165 11/12/2019   HDL 43 11/12/2019   LDLCALC 92 11/12/2019   LDLDIRECT 96.0 10/07/2017   TRIG 171 (H) 11/12/2019   CHOLHDL 3.8 11/12/2019    Hepatic Function Latest Ref Rng & Units 11/21/2019 11/20/2019 11/12/2019  Total Protein 6.5 - 8.1 g/dL 5.7(L) 7.7 6.7  Albumin 3.5 - 5.0 g/dL 3.4(L) 4.7 4.5  AST 15 - 41 U/L 21 29 26   ALT 0 - 44 U/L 24 34 33  Alk Phosphatase 38 - 126 U/L 52 69 83  Total Bilirubin 0.3 - 1.2 mg/dL 1.2 1.0 0.6  Bilirubin, Direct 0.0 - 0.3 mg/dL - - -     The 10-year ASCVD risk score Mikey Bussing DC Jr., et al., 2013) is: 23.6%   Values used to calculate the score:     Age: 64 years     Sex: Male     Is Non-Hispanic African American: No     Diabetic: No     Tobacco smoker: No     Systolic Blood Pressure: 629 mmHg     Is BP treated: Yes     HDL Cholesterol: 43 mg/dL     Total Cholesterol: 165 mg/dL   Patient has failed these meds in past: none Patient is currently controlled on the following medications:  . Rosuvastatin 20 mg 1 tablet daily  We discussed:  diet and exercise extensively -Discussed how triglycerides can increase when: . Eating eat too much food . Eating high-fat foods such as fried foods, red meat, chicken skin, egg yolks, high-fat dairy, butter, lard, shortening, margarine, and fast food . Eating foods high in simple carbohydrates such as fresh and canned fruit, candy, ice cream and sweetened yogurt, sweetened drinks like juices, cereal, jams, foods and drinks with corn syrup, or sugar listed as the first ingredient . Drinking alcohol  -Diet: Does not eat desserts but does eat fried foods from time to time  Plan  Continue current medications  Depression/anxiety   Depression screen [facility] 2/9 05/04/2020 10/11/2016 10/11/2016  Decreased Interest 0 0 0  Down, Depressed, Hopeless 0 0 0  PHQ - 2 Score 0 0 0  Altered sleeping 0 - -  Tired, decreased energy 0 - -  Change in appetite 0 - -  Feeling bad or failure about yourself  0 - -  Trouble concentrating 0 - -  Moving slowly or fidgety/restless 0 - -  Suicidal thoughts  0 - -  PHQ-9 Score 0 - -  Difficult doing work/chores Not difficult at  all - -  Some recent data might be hidden    Patient has failed these meds in past: none Patient is currently controlled on the following medications:  . Sertraline 50 mg 1 tablet daily  Plan  Continue current medications   Insomnia   Patient has failed these meds in past: none Patient is currently controlled on the following medications:  . Alprazolam 0.25 mg 1 tablet at bedtime PRN  We discussed:  Long term risks of taking benzodiazepines such as increased risk for bone fractures and memory loss - patient reports taking almost every night  Plan Reassess at follow up. Continue current medications   BPH   PSA  Date Value Ref Range Status  08/06/2013 1.60 0.10 - 4.00 ng/mL Final  02/04/2012 2.15 0.10 - 4.00 ng/mL Final  12/25/2010 1.48 0.10 - 4.00 ng/mL Final     Patient has failed these meds in past: none Patient is currently controlled on the following medications:  . No medications  Plan Managed by urologist. Dr. Jeffie Pollock Continue without medications.  Miscellaneous/OTC   Patient is currently on the following medications:  [name] Vitamin D 2000 units 1 capsule daily . Multivitamin 1 tablet daily . Potassium citrate 10 mEq 1 tablet 3 times daily (urology) . Turmeric 500 mg 1 capsule daily - bleeding risk  We discussed:  Additional bleeding risk with Turmeric and considering an alternative option  Plan  Continue current medications  Vaccines   Reviewed and discussed patient's vaccination history.    Immunization History  Administered Date(s) Administered  . Fluad Quad(high Dose 65+) 05/28/2019, 06/13/2020  . Influenza Split 07/30/2011, 07/06/2013  . Influenza, High Dose Seasonal PF 05/26/2015, 05/18/2016, 06/18/2017, 07/07/2018  . Influenza,inj,Quad PF,6+ Mos 06/16/2014  . PFIZER SARS-COV-2 Vaccination 10/22/2019, 11/17/2019, 06/14/2020  . Pneumococcal Conjugate-13 09/17/2010, 08/31/2013  . Pneumococcal Polysaccharide-23 04/08/2015  . Tdap 11/27/2011  .  Zoster 09/28/2010    Plan  Recommended patient receive Shingles vaccine at pharmacy.   Medication Management   Patient[facility] pharmacy [facility]:  [id number] [place] [place] [id number]: [phone]: [phone] [facility] [address] [place]Phone: [phone]: [phone] Uses pill box? Yes - weekly pill box Pt endorses 99% compliance - once every 6 months  We discussed: Current pharmacy is preferred with insurance plan and patient is satisfied with pharmacy services  Plan  Continue current medication management strategy    Follow up: 6 month phone visit 2 month BP assessment  MJeni Salles PharmD BMansfield CenterPharmacist LIukaat BAliquippa

## 2020-09-05 NOTE — Patient Instructions (Addendum)
Hi Francis Campbell,  It was so nice to meet you over the phone! Below is a summary of the topics we discussed. As a reminder, I will have my assistant check back in with you on your at home blood pressures in a couple of months to see if moving losartan to the evening provides any additional blood pressure lowering. Also, don't forget to look into getting your shingles shot at your pharmacy.  Please give me a call if you have questions or need anything before our follow up!  Best, Maddie  Jeni Salles, PharmD [facility] Clinical Pharmacist [place] [facility] [place]   Visit Information  Goals Addressed            This Visit's Progress   . Pharmacy Care Plan       CARE PLAN ENTRY (see longitudinal plan of care for additional care plan information)  Current Barriers:  . Chronic Disease Management support, education, and care coordination needs related to Hypertension, Hyperlipidemia, Atrial Fibrillation, Depression, Anxiety, and insomnia   Hypertension BP Readings from Last 3 Encounters:  05/10/20 124/70  02/29/20 120/62  02/17/20 124/80   . Pharmacist Clinical Goal(s): o Over the next 180 days, patient will work with PharmD and providers to maintain BP goal <140/90 . Current regimen:  . Losartan 25 mg 1 tablet daily   . Diltiazem 240 mg 1 capsule in the morning . Metoprolol succinate 25 mg 1 tablet daily . Interventions: o Discussed DASH eating plan recommendations: . Emphasizes vegetables, fruits, and whole-grains . Includes fat-free or low-fat dairy products, fish, poultry, beans, nuts, and vegetable oils . Limits foods that are high in saturated fat. These foods include fatty meats, full-fat dairy products, and tropical oils such as coconut, palm kernel, and palm oils. . Limits sugar-sweetened beverages and sweets . Limiting sodium intake to < 1500 mg/day . Patient self care activities - Over the next 180 days, patient will: o Check blood pressure weekly,  document, and provide at future appointments o Ensure daily salt intake < 2300 mg/day o Move losartan to the evening  Hyperlipidemia Lab Results  Component Value Date/Time   LDLCALC 92 11/12/2019 09:40 AM   LDLDIRECT 96.0 10/07/2017 03:10 PM   . Pharmacist Clinical Goal(s): o Over the next 180 days, patient will work with PharmD and providers to maintain LDL goal < 100 . Current regimen:  o Rosuvastatin 20 mg 1 tablet daily . Interventions: o Discussed how triglycerides can increase when: . Eating eat too much food . Eating high-fat foods such as fried foods, red meat, chicken skin, egg yolks, high-fat dairy, butter, lard, shortening, margarine, and fast food . Eating foods high in simple carbohydrates such as fresh and canned fruit, candy, ice cream and sweetened yogurt, sweetened drinks like juices, cereal, jams, foods and drinks with corn syrup, or sugar listed as the first ingredient . Drinking alcohol   . Patient self care activities - Over the next 180 days, patient will: o Continue current medications  Afib Pulse Readings from Last 3 Encounters:  05/10/20 60  02/29/20 60  02/17/20 60   . Pharmacist Clinical Goal(s): o Over the next 180 days, patient will work with PharmD and providers to maintain heart rate below 110 beats per minute . Current regimen:  . Diltiazem 240 mg 1 capsule in the morning . Metoprolol succinate 25 mg 1 tablet daily in morning  . Flecainide 100 mg 1 tablet twice daily . Warfarin 5 mg, 7.5 mg on Mon, Wed and Fri  and 5 mg on all other days . Interventions: o Discussed monitoring heart rate along with blood pressure o Discussed monitoring for signs of bleeding such as unexplained and excessive bleeding from a cut or injury, easy or excessive bruising, blood in urine or stools, and nosebleeds without a known cause o Educated on increased bleed risk while taking turmeric with warfarin and consider decreasing dose or finding a safer  alternative . Patient self care activities - Over the next 180 days, patient will: o Check heart rate, document, and provide at future appointments o Contact provider with any episodes of bleeding  Depression/anxiety . Pharmacist Clinical Goal(s) o Over the next 180 days, patient will work with PharmD and providers to manage symptoms of depression/anxiety . Current regimen:  o Sertraline 50 mg 1 tablet daily . Patient self care activities - Over the next 180 days, patient will: o Continue current medication  Insomnia . Pharmacist Clinical Goal(s) o Over the next 180 days, patient will work with PharmD and providers to improve sleep . Current regimen:  o Alprazolam 0.25 mg 1 tablet at bedtime as needed . Interventions: o We discussed long term risks of taking benzodiazepines such as increased risk for bone fractures, falls, and memory loss o Discussed practicing good sleep hygiene by setting a sleep schedule and maintaining it, avoid excessive napping, following a nightly routine, avoiding screen time for 30-60 minutes before going to bed, and making the bedroom a cool, quiet and dark space . Patient self care activities - Over the next 180 days, patient will: o Continue current medication  Medication management . Pharmacist Clinical Goal(s): o Over the next 180 days, patient will work with PharmD and providers to maintain optimal medication adherence . Current pharmacy: [facility] mail [facility] . Interventions o Comprehensive medication review performed. o Continue current medication management strategy . Patient self care activities - Over the next 180 days, patient will: o Take medications as prescribed o Report any questions or concerns to PharmD and/or provider(s)  Initial goal documentation        Francis Campbell was given information about Chronic Care Management services today including:  1. CCM service includes personalized support from designated clinical staff supervised by  his physician, including individualized plan of care and coordination with other care providers 2. 24/7 contact phone numbers for assistance for urgent and routine care needs. 3. Standard insurance, coinsurance, copays and deductibles apply for chronic care management only during months in which we provide at least 20 minutes of these services. Most insurances cover these services at 100%, however patients may be responsible for any copay, coinsurance and/or deductible if applicable. This service may help you avoid the need for more expensive face-to-face services. 4. Only one practitioner may furnish and bill the service in a calendar month. 5. The patient may stop CCM services at any time (effective at the end of the month) by phone call to the office staff.  Patient agreed to services and verbal consent obtained.   The patient verbalized understanding of instructions, educational materials, and care plan provided today and agreed to receive a mailed copy of patient instructions, educational materials, and care plan.  Telephone follow up appointment with pharmacy team member scheduled for: 6 months  Viona Gilmore, [facility]  Insomnia Insomnia is a sleep disorder that makes it difficult to fall asleep or stay asleep. Insomnia can cause fatigue, low energy, difficulty concentrating, mood swings, and poor performance at work or school. There are three different ways to  classify insomnia:  Difficulty falling asleep.  Difficulty staying asleep.  Waking up too early in the morning. Any type of insomnia can be long-term (chronic) or short-term (acute). Both are common. Short-term insomnia usually lasts for three months or less. Chronic insomnia occurs at least three times a week for longer than three months. What are the causes? Insomnia may be caused by another condition, situation, or substance, such as:  Anxiety.  Certain medicines.  Gastroesophageal reflux disease (GERD) or other  gastrointestinal conditions.  Asthma or other breathing conditions.  Restless legs syndrome, sleep apnea, or other sleep disorders.  Chronic pain.  Menopause.  Stroke.  Abuse of alcohol, tobacco, or illegal drugs.  Mental health conditions, such as depression.  Caffeine.  Neurological disorders, such as Alzheimer's disease.  An overactive thyroid (hyperthyroidism). Sometimes, the cause of insomnia may not be known. What increases the risk? Risk factors for insomnia include:  Gender. Women are affected more often than men.  Age. Insomnia is more common as you get older.  Stress.  Lack of exercise.  Irregular work schedule or working night shifts.  Traveling between different time zones.  Certain medical and mental health conditions. What are the signs or symptoms? If you have insomnia, the main symptom is having trouble falling asleep or having trouble staying asleep. This may lead to other symptoms, such as:  Feeling fatigued or having low energy.  Feeling nervous about going to sleep.  Not feeling rested in the morning.  Having trouble concentrating.  Feeling irritable, anxious, or depressed. How is this diagnosed? This condition may be diagnosed based on:  Your symptoms and medical history. Your health care provider may ask about: ? Your sleep habits. ? Any medical conditions you have. ? Your mental health.  A physical exam. How is this treated? Treatment for insomnia depends on the cause. Treatment may focus on treating an underlying condition that is causing insomnia. Treatment may also include:  Medicines to help you sleep.  Counseling or therapy.  Lifestyle adjustments to help you sleep better. Follow these instructions at home: Eating and drinking   Limit or avoid alcohol, caffeinated beverages, and cigarettes, especially close to bedtime. These can disrupt your sleep.  Do not eat a large meal or eat spicy foods right before bedtime.  This can lead to digestive discomfort that can make it hard for you to sleep. Sleep habits   Keep a sleep diary to help you and your health care provider figure out what could be causing your insomnia. Write down: ? When you sleep. ? When you wake up during the night. ? How well you sleep. ? How rested you feel the next day. ? Any side effects of medicines you are taking. ? What you eat and drink.  Make your bedroom a dark, comfortable place where it is easy to fall asleep. ? Put up shades or blackout curtains to block light from outside. ? Use a white noise machine to block noise. ? Keep the temperature cool.  Limit screen use before bedtime. This includes: ? Watching TV. ? Using your smartphone, tablet, or computer.  Stick to a routine that includes going to bed and waking up at the same times every day and night. This can help you fall asleep faster. Consider making a quiet activity, such as reading, part of your nighttime routine.  Try to avoid taking naps during the day so that you sleep better at night.  Get out of bed if you are still awake  after 15 minutes of trying to sleep. Keep the lights down, but try reading or doing a quiet activity. When you feel sleepy, go back to bed. General instructions  Take over-the-counter and prescription medicines only as told by your health care provider.  Exercise regularly, as told by your health care provider. Avoid exercise starting several hours before bedtime.  Use relaxation techniques to manage stress. Ask your health care provider to suggest some techniques that may work well for you. These may include: ? Breathing exercises. ? Routines to release muscle tension. ? Visualizing peaceful scenes.  Make sure that you drive carefully. Avoid driving if you feel very sleepy.  Keep all follow-up visits as told by your health care provider. This is important. Contact a health care provider if:  You are tired throughout the  day.  You have trouble in your daily routine due to sleepiness.  You continue to have sleep problems, or your sleep problems get worse. Get help right away if:  You have serious thoughts about hurting yourself or someone else. If you ever feel like you may hurt yourself or others, or have thoughts about taking your own life, get help right away. You can go to your nearest emergency department or call:  Your local emergency services (911 in the [place]  A suicide crisis helpline, such as the [place] at (548)142-6239. This is open 24 hours a day. Summary  Insomnia is a sleep disorder that makes it difficult to fall asleep or stay asleep.  Insomnia can be long-term (chronic) or short-term (acute).  Treatment for insomnia depends on the cause. Treatment may focus on treating an underlying condition that is causing insomnia.  Keep a sleep diary to help you and your health care provider figure out what could be causing your insomnia. This information is not intended to replace advice given to you by your health care provider. Make sure you discuss any questions you have with your health care provider. Document Revised: 08/16/2017 Document Reviewed: 06/13/2017 Elsevier Patient Education  2020 [employer].

## 2020-09-07 ENCOUNTER — Encounter (INDEPENDENT_AMBULATORY_CARE_PROVIDER_SITE_OTHER): Payer: Self-pay | Admitting: Otolaryngology

## 2020-09-07 ENCOUNTER — Ambulatory Visit (INDEPENDENT_AMBULATORY_CARE_PROVIDER_SITE_OTHER): Payer: Medicare Other | Admitting: Otolaryngology

## 2020-09-07 ENCOUNTER — Other Ambulatory Visit: Payer: Self-pay

## 2020-09-07 ENCOUNTER — Ambulatory Visit (INDEPENDENT_AMBULATORY_CARE_PROVIDER_SITE_OTHER): Payer: Medicare Other

## 2020-09-07 VITALS — Temp 95.9°F

## 2020-09-07 DIAGNOSIS — H6123 Impacted cerumen, bilateral: Secondary | ICD-10-CM | POA: Diagnosis not present

## 2020-09-07 DIAGNOSIS — Z8669 Personal history of other diseases of the nervous system and sense organs: Secondary | ICD-10-CM | POA: Diagnosis not present

## 2020-09-07 DIAGNOSIS — I480 Paroxysmal atrial fibrillation: Secondary | ICD-10-CM | POA: Diagnosis not present

## 2020-09-07 DIAGNOSIS — Z5181 Encounter for therapeutic drug level monitoring: Secondary | ICD-10-CM | POA: Diagnosis not present

## 2020-09-07 LAB — POCT INR: INR: 2.7 (ref 2.0–3.0)

## 2020-09-07 NOTE — Patient Instructions (Signed)
Continue taking 68m daily except 7.532mevery Monday, Wednesday and Friday. Recheck INR in 6 weeks

## 2020-09-07 NOTE — Progress Notes (Signed)
HPI: Crawford Tamura. is a 74 y.o. male who presents for evaluation of wax buildup in his ears.  He has had previous history of chronic problems mostly on the right side where he has had external otitis.  He was last seen about 4 months ago.  He is doing well presently with mild blockage..  Past Medical History:  Diagnosis Date  . Allergy   . Aortic regurgitation   . Arthritis   . Asthma    only as child  . Atrial flutter ([place])    a. Dx 09/2012.  . Cataract   . Crohn disease ([place])    Remotely - in the 1990s  . Crohn's colitis ([place]) 11/20/1984  . Esophageal reflux    Remotely  . History of kidney stones   . Hypercholesteremia   . Hypertension   . Paroxysmal atrial fibrillation (H[facility])    Takes PRN flecainide  . 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  [name] TIA (transient ischemic attack)    ~2005 - had visual symptoms, was told by [name] this may have been a TIA but it's unclear  . Valvular heart disease    Mild AI, ASclerosis without AS by echo 10/2011   Past Surgical History:  Procedure Laterality Date  . CARDIOVERSION N/A 05/08/2013   Procedure: CARDIOVERSION;  Surgeon: Darlin Coco, MD;  Location: [facility] ENDOSCOPY;  Service: Cardiovascular;  Laterality: N/A;  . CATARACT EXTRACTION     Right 12/2019  . CATARACT EXTRACTION    . CYSTOSCOPY/RETROGRADE/URETEROSCOPY/STONE EXTRACTION WITH BASKET Right 02/12/2013   Procedure: CYSTOSCOPY/RETROGRADE/URETEROSCOPY/STONE EXTRACTION WITH BASKET, WITH  STENT PLACEMENT RIGHT URETERAL DILATION;  Surgeon: [name], MD;  Location: [facility] ORS;  Service: Urology;  Laterality: Right;  . EYE SURGERY Bilateral 1950, 1960, 1964   left, right, left  . HERNIA REPAIR  2005  . KNEE SURGERY Bilateral 1990, 2000  . LITHOTRIPSY    . ROTATOR CUFF REPAIR Left 2000  . TONSILLECTOMY     Social History   Socioeconomic History  . Marital status: Married    Spouse name: Not on file  . Number of children: 2  . Years of  education: Not on file  . Highest education level: Not on file  Occupational History  . Occupation: TECHNICAL SUP    Employer: [employer]  Tobacco Use  . Smoking status: Never Smoker  . Smokeless tobacco: Never Used  Vaping Use  . Vaping Use: Never used  Substance and Sexual Activity  . Alcohol use: No  . Drug use: No  . Sexual activity: Yes    Birth control/protection: None  Other Topics Concern  . Not on file  Social History Narrative   Non smoker  No ets          Social Determinants of Health   Financial Resource Strain: Low Risk   . Difficulty of Paying Living Expenses: Not hard at all  Food Insecurity: No Food Insecurity  . Worried About Charity fundraiser in the Last Year: Never true  . Ran Out of Food in the Last Year: Never true  Transportation Needs: No Transportation Needs  . Lack of Transportation (Medical): No  . Lack of Transportation (Non-Medical): No  Physical Activity: Inactive  . Days of Exercise per Week: 0 days  . Minutes of Exercise per Session: 0 min  Stress: No Stress Concern Present  . Feeling of Stress : Not at all  Social Connections: Socially Integrated  .  Frequency of Communication with Friends and Family: More than three times a week  . Frequency of Social Gatherings with Friends and Family: More than three times a week  . Attends Religious Services: More than 4 times per year  . Active Member of Clubs or Organizations: Yes  . Attends Archivist Meetings: More than 4 times per year  . Marital Status: Married   Family History  Problem Relation Age of Onset  . Stroke Mother   . Hypertension Mother   . Kidney disease Mother        stones  . Nephrolithiasis Brother   . Arthritis Paternal Grandmother   . Heart disease Paternal Grandmother   . Leukemia Father   . Heart disease Father   . Nephrolithiasis Father   . Kidney cancer Son   . Appendicitis Son   . Heart attack Neg Hx   . Colon cancer Neg Hx   . Esophageal cancer Neg  Hx   . Pancreatic cancer Neg Hx   . Stomach cancer Neg Hx   . Liver disease Neg Hx    Allergies  Allergen Reactions  . Chocolate Palpitations  . Chocolate Flavor Shortness Of Breath    Other reaction(s): Respiratory Distress  . Dextroamphetamine Shortness Of Breath    Other reaction(s): Respiratory Distress  . Dextromethorphan Palpitations  . Flomax [Tamsulosin Hcl] Other (See Comments)    ? Causes A Fib  . Floxin [Ofloxacin] Other (See Comments)    hallucinations  . Guaifenesin Shortness Of Breath  . Lisinopril Cough  . Tavist Allergy [Clemastine Fumarate] Other (See Comments)    A fib worse  . Clemastine     Other reaction(s): Irregular Heart Rate  . Robitussin Cold+Flu Daytime  [Dm-Phenylephrine-Acetaminophen]     Other reaction(s): Respiratory Distress  . Tamsulosin Other (See Comments)    Other reaction(s): Irregular Heart Rate  . Amiodarone Other (See Comments)    Abn  thyroid  . Robitussin Cf [Pseudoephedrine-Dm-Gg] Palpitations   Prior to Admission medications   Medication Sig Start Date End Date Taking? Authorizing Provider  ALPRAZolam (XANAX) 0.25 MG tablet TAKE 1 TABLET BY MOUTH AT BEDTIME AS NEEDED FOR ANXIETY OR SLEEP. 08/16/20   Burchette, Alinda Sierras, MD  Cholecalciferol (VITAMIN D3) 1000 UNITS CAPS Take 2,000 Units by mouth every morning.     [provider]  diltiazem (CARTIA XT) 240 MG 24 hr capsule Take 240 mg by mouth in the morning 12/31/19   Skeet Latch, MD  flecainide (TAMBOCOR) 100 MG tablet Take 1 tablet (100 mg total) by mouth 2 (two) times daily. 11/12/19   Skeet Latch, MD  losartan (COZAAR) 25 MG tablet TAKE 1 TABLET EVERY DAY 03/10/20   Skeet Latch, MD  metoprolol succinate (TOPROL-XL) 25 MG 24 hr tablet Take 1 tablet (25 mg total) by mouth daily. Take with or immediately following a meal. 11/12/19   Skeet Latch, MD  Multiple Vitamin (MULTIVITAMIN) tablet Take 1 tablet by mouth daily.    [provider]  potassium  citrate (UROCIT-K) 10 MEQ (1080 MG) SR tablet TAKE 1 TABLET THREE TIMES DAILY 07/11/20   Irine Seal, MD  rosuvastatin (CRESTOR) 20 MG tablet Take 1 tablet (20 mg total) by mouth daily. TAKE 1 TABLET EVERY DAY 11/12/19   Skeet Latch, MD  sertraline (ZOLOFT) 50 MG tablet TAKE 1 TABLET (50 MG TOTAL) BY MOUTH DAILY. NEEDS OFFICE VISIT FOR FURTHER REFILLS 04/15/20   [name], MD  Turmeric 500 MG CAPS Take 500 mg  by mouth daily.    [provider]  warfarin (COUMADIN) 5 MG tablet TAKE 1 TO 1.5 TABLETS DAILY AS  DIRECTED BY COUMADIN CLINIC 04/11/20   Skeet Latch, MD  digoxin (LANOXIN) 0.125 MG tablet Take 1 tablet (125 mcg total) by mouth daily. 11/26/11 11/27/11  Darlin Coco, MD  losartan-hydrochlorothiazide [facility]) 100-12.5 MG per tablet  08/26/11 11/27/11  [provider]  pantoprazole (PROTONIX) 40 MG tablet Take 1 tablet (40 mg total) by mouth daily. 10/29/11 11/27/11  Sable Feil, MD     Positive ROS: Otherwise negative  All other systems have been reviewed and were otherwise negative with the exception of those mentioned in the HPI and as above.  Physical Exam: Constitutional: Alert, well-appearing, no acute distress Ears: External ears without lesions or tenderness. Ear canals right ear canal is smaller than the left ear canal.  There is still little bit of gentian violet within the right ear canal.  Ear canal was cleaned with forceps and curettes as he had a number of hairs within the ear canal.  The TM was clear.  There is no signs of external otitis today.  Left ear canal reveal minimal cerumen buildup with a large amount of hairs again that was cleaned in the office.  TM was clear.. Nasal: External nose without lesions. Clear nasal passages Oral: Oropharynx clear. Neck: No palpable adenopathy or masses Respiratory: Breathing comfortably  Skin: No facial/neck lesions or rash noted.  Cerumen impaction removal  Date/Time: 09/07/2020 8:43  AM Performed by: Rozetta Nunnery, MD Authorized by: Rozetta Nunnery, MD   Consent:    Consent obtained:  Verbal   Consent given by:  Patient   Risks discussed:  Pain and bleeding Procedure details:    Location:  L ear and R ear   Procedure type: curette and forceps   Post-procedure details:    Inspection:  TM intact and canal normal   Hearing quality:  Improved   Patient tolerance of procedure:  Tolerated well, no immediate complications Comments:     He has had history of right external otitis.  Right ear canal is smaller.  TMs were clear bilaterally.  No significant inflammatory changes in the right ear canal today.    Assessment: Wax buildup with history of right external otitis.  This has been going on for several years.  Plan: He will follow up on annual basis for recheck earlier if he has any problems with the right ear.  Radene Journey, MD

## 2020-09-18 ENCOUNTER — Other Ambulatory Visit: Payer: Self-pay | Admitting: Cardiovascular Disease

## 2020-10-05 DIAGNOSIS — N202 Calculus of kidney with calculus of ureter: Secondary | ICD-10-CM | POA: Diagnosis not present

## 2020-10-05 DIAGNOSIS — N5201 Erectile dysfunction due to arterial insufficiency: Secondary | ICD-10-CM | POA: Diagnosis not present

## 2020-10-13 ENCOUNTER — Other Ambulatory Visit: Payer: Self-pay | Admitting: Cardiovascular Disease

## 2020-10-19 ENCOUNTER — Ambulatory Visit (INDEPENDENT_AMBULATORY_CARE_PROVIDER_SITE_OTHER): Payer: Medicare Other

## 2020-10-19 ENCOUNTER — Other Ambulatory Visit: Payer: Self-pay

## 2020-10-19 DIAGNOSIS — Z5181 Encounter for therapeutic drug level monitoring: Secondary | ICD-10-CM

## 2020-10-19 DIAGNOSIS — I480 Paroxysmal atrial fibrillation: Secondary | ICD-10-CM

## 2020-10-19 LAB — POCT INR: INR: 3 (ref 2.0–3.0)

## 2020-10-19 NOTE — Patient Instructions (Signed)
Continue taking 45m daily except 7.533mevery Monday, Wednesday and Friday. Recheck INR in 6 weeks

## 2020-10-27 DIAGNOSIS — N202 Calculus of kidney with calculus of ureter: Secondary | ICD-10-CM | POA: Diagnosis not present

## 2020-10-27 DIAGNOSIS — N3000 Acute cystitis without hematuria: Secondary | ICD-10-CM | POA: Diagnosis not present

## 2020-11-02 DIAGNOSIS — N202 Calculus of kidney with calculus of ureter: Secondary | ICD-10-CM | POA: Diagnosis not present

## 2020-11-02 DIAGNOSIS — N132 Hydronephrosis with renal and ureteral calculous obstruction: Secondary | ICD-10-CM | POA: Diagnosis not present

## 2020-11-20 ENCOUNTER — Other Ambulatory Visit: Payer: Self-pay | Admitting: Cardiovascular Disease

## 2020-11-21 ENCOUNTER — Other Ambulatory Visit: Payer: Self-pay | Admitting: Cardiovascular Disease

## 2020-11-30 ENCOUNTER — Ambulatory Visit (INDEPENDENT_AMBULATORY_CARE_PROVIDER_SITE_OTHER): Payer: Medicare Other

## 2020-11-30 ENCOUNTER — Other Ambulatory Visit: Payer: Self-pay

## 2020-11-30 DIAGNOSIS — I480 Paroxysmal atrial fibrillation: Secondary | ICD-10-CM

## 2020-11-30 DIAGNOSIS — Z5181 Encounter for therapeutic drug level monitoring: Secondary | ICD-10-CM

## 2020-11-30 LAB — POCT INR: INR: 3 (ref 2.0–3.0)

## 2020-11-30 NOTE — Patient Instructions (Signed)
Continue taking 3m daily except 7.514mevery Monday, Wednesday and Friday. Recheck INR in 6 weeks

## 2020-12-08 ENCOUNTER — Encounter: Payer: Self-pay | Admitting: Nurse Practitioner

## 2020-12-08 ENCOUNTER — Ambulatory Visit (INDEPENDENT_AMBULATORY_CARE_PROVIDER_SITE_OTHER): Payer: Medicare Other | Admitting: Nurse Practitioner

## 2020-12-08 VITALS — BP 112/60 | HR 80 | Ht 73.0 in | Wt 196.0 lb

## 2020-12-08 DIAGNOSIS — R1031 Right lower quadrant pain: Secondary | ICD-10-CM

## 2020-12-08 NOTE — Progress Notes (Unsigned)
ASSESSMENT AND PLAN    # 75 yo male with very mild RLQ discomfort, often noticeable with bending / twisting. History of SBO with transition point in RLQ in March 2021.  He has no complaints of abdominal distention, nausea/vomiting or bowel changes.  His abdominal exam is unremarkable.   --I suspect his pain is musculoskeletal but he understands to call us right away should he develop any of the above symptoms.  At that point we can obtain a KUB to check for developing obstruction  # Chronic constipation.  His bowels are moving well with MiraLAX  # Colon cancer screening. Due for 10 year screening in February 2023  HISTORY OF PRESENT ILLNESS    Chief Complaint : RLQ discomfort  Pope Brunty. is a 75 y.o. male known to Dr. Ardis Hughs with a past medical history of SBO likely secondary to adhesions, chronic constipation  [name] is "aware" of an area in his RLQ where he had a SBO in March 2021.  The obstruction was felt to be secondary to adhesions.  The discomfort is 1 out 10 on pain scale. The RLQ discomfort is not related to eating, it possibly gets better after a bowel movement.  Twisting makes it more noticeable. He has a catch like sensation in RLQ sometimes when twisting. His bowels are moving well.  No urinary symptoms .  No complaints of bloating/abdominal distention .  No nausea or vomiting.   Many years ago Dr. Sharlett Iles apparently mentioned to [name] the possibility of Crohn's disease but this was eventually ruled out.  I we will try to remove that diagnosis from his PMH  Past Medical History:  Diagnosis Date  . Allergy   . Aortic regurgitation   . Arthritis   . Asthma    only as child  . Atrial flutter ([place])    a. Dx 09/2012.  . Cataract   . Esophageal reflux    Remotely  . History of kidney stones   . Hypercholesteremia   . Hypertension   . Paroxysmal atrial fibrillation (HCC)    Takes PRN flecainide  . 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   [name] TIA (transient ischemic attack)    ~2005 - had visual symptoms, was told by [name] this may have been a TIA but it's unclear  . Valvular heart disease    Mild AI, ASclerosis without AS by echo 10/2011    Current Medications, Allergies, Past Surgical History, Family History and Social History were reviewed in [employer] record.   Current Outpatient Medications  Medication Sig Dispense Refill  . ALPRAZolam (XANAX) 0.25 MG tablet TAKE 1 TABLET BY MOUTH AT BEDTIME AS NEEDED FOR ANXIETY OR SLEEP. 30 tablet 3  . Cholecalciferol (VITAMIN D3) 1000 UNITS CAPS Take 2,000 Units by mouth every morning.    . diltiazem (CARTIA XT) 240 MG 24 hr capsule Take 240 mg by mouth in the morning 90 capsule 3  . flecainide (TAMBOCOR) 100 MG tablet TAKE 1 TABLET TWICE DAILY 180 tablet 2  . losartan (COZAAR) 25 MG tablet TAKE 1 TABLET EVERY DAY 90 tablet 3  . metoprolol succinate (TOPROL-XL) 25 MG 24 hr tablet TAKE 1 TABLET EVERY DAY WITH OR IMMEDIATELY FOLLOWING A MEAL 90 tablet 3  . Multiple Vitamin (MULTIVITAMIN) tablet Take 1 tablet by mouth daily.    . potassium citrate (UROCIT-K) 10 MEQ (1080 MG) SR tablet TAKE 1 TABLET THREE TIMES DAILY 270  tablet 3  . rosuvastatin (CRESTOR) 20 MG tablet TAKE 1 TABLET EVERY DAY (NEW DOSE) 90 tablet 3  . sertraline (ZOLOFT) 50 MG tablet TAKE 1 TABLET (50 MG TOTAL) BY MOUTH DAILY. NEEDS OFFICE VISIT FOR FURTHER REFILLS 90 tablet 1  . warfarin (COUMADIN) 5 MG tablet TAKE 1 TO 1 AND 1/2 TABLETS DAILY AS DIRECTED BY COUMADIN CLINIC 135 tablet 1  . Turmeric 500 MG CAPS Take 500 mg by mouth daily.     No current facility-administered medications for this visit.    Review of Systems: No chest pain. No shortness of breath. No urinary complaints.   PHYSICAL EXAM :    Wt Readings from Last 3 Encounters:  12/08/20 196 lb (88.9 kg)  05/10/20 194 lb 6.4 oz (88.2 kg)  02/29/20 193 lb 1.6 oz (87.6 kg)    BP 112/60   Pulse 80   Ht 6' 1"  (1.854 m)    Wt 196 lb (88.9 kg)   BMI 25.86 kg/m  Constitutional:  Pleasant male in no acute distress. Psychiatric: Normal mood and affect. Behavior is normal. EENT: Pupils normal.  Conjunctivae are normal. No scleral icterus. Neck supple.  Cardiovascular: Normal rate, regular rhythm. No edema Pulmonary/chest: Effort normal and breath sounds normal. No wheezing, rales or rhonchi. Abdominal: Soft, nondistended, nontender. Bowel sounds active throughout. There are no masses palpable. No hepatomegaly. Neurological: Alert and oriented to person place and time. Skin: Skin is warm and dry. No rashes noted.  Tye Savoy, NP  12/08/2020, 3:06 PM

## 2020-12-08 NOTE — Patient Instructions (Signed)
If you are age 74 or older, your body mass index should be between 23-30. Your Body mass index is 25.86 kg/m. If this is out of the aforementioned range listed, please consider follow up with your Primary Care Provider.  If you are age 43 or younger, your body mass index should be between 19-25. Your Body mass index is 25.86 kg/m. If this is out of the aformentioned range listed, please consider follow up with your Primary Care Provider.   Call the office and speak with Paula's nurse if you continue to have any additional abdominal pain.  Follow up as needed.  Thank you for entrusting me with your care and choosing [facility].  Tye Savoy, NP-C

## 2020-12-09 ENCOUNTER — Encounter: Payer: Self-pay | Admitting: Nurse Practitioner

## 2020-12-12 NOTE — Progress Notes (Signed)
I agree with the above note, plan 

## 2020-12-18 ENCOUNTER — Other Ambulatory Visit: Payer: Self-pay | Admitting: Family Medicine

## 2020-12-19 ENCOUNTER — Telehealth: Payer: Self-pay

## 2020-12-19 NOTE — Telephone Encounter (Signed)
Last filled 08/16/2020 Last OV 03/07/2020  Ok to fill?

## 2020-12-19 NOTE — Telephone Encounter (Signed)
Francis Craze, NP sent to Francis Keen, LPN Beth, will you please remove Crohn's disease and regional enteritis off of patient's past medical history? I tried to do it but it would not let me without first removing it from the problem list and I was unable to figure out how to take it off the problem list. Thanks     done

## 2020-12-30 DIAGNOSIS — Z23 Encounter for immunization: Secondary | ICD-10-CM | POA: Diagnosis not present

## 2021-01-08 ENCOUNTER — Other Ambulatory Visit: Payer: Self-pay | Admitting: Physician Assistant

## 2021-01-11 ENCOUNTER — Ambulatory Visit (INDEPENDENT_AMBULATORY_CARE_PROVIDER_SITE_OTHER): Payer: Medicare Other

## 2021-01-11 ENCOUNTER — Other Ambulatory Visit: Payer: Self-pay

## 2021-01-11 DIAGNOSIS — I480 Paroxysmal atrial fibrillation: Secondary | ICD-10-CM | POA: Diagnosis not present

## 2021-01-11 DIAGNOSIS — Z5181 Encounter for therapeutic drug level monitoring: Secondary | ICD-10-CM | POA: Diagnosis not present

## 2021-01-11 LAB — POCT INR: INR: 2.3 (ref 2.0–3.0)

## 2021-01-11 NOTE — Patient Instructions (Signed)
Continue taking 34m daily except 7.533mevery Monday, Wednesday and Friday. Recheck INR in 6 weeks

## 2021-01-15 ENCOUNTER — Other Ambulatory Visit: Payer: Self-pay | Admitting: Family Medicine

## 2021-01-16 ENCOUNTER — Other Ambulatory Visit: Payer: Self-pay

## 2021-01-16 NOTE — Telephone Encounter (Signed)
Refilled once.  Set up office follow-up

## 2021-01-16 NOTE — Telephone Encounter (Signed)
Last filled 12/19/2020 Last OV 02/29/2020  Ok to fill?

## 2021-01-17 ENCOUNTER — Telehealth: Payer: Self-pay | Admitting: Family Medicine

## 2021-01-17 ENCOUNTER — Ambulatory Visit (INDEPENDENT_AMBULATORY_CARE_PROVIDER_SITE_OTHER): Payer: Medicare Other | Admitting: Family Medicine

## 2021-01-17 ENCOUNTER — Encounter: Payer: Self-pay | Admitting: Family Medicine

## 2021-01-17 VITALS — BP 120/60 | HR 60 | Temp 97.4°F | Wt 196.9 lb

## 2021-01-17 DIAGNOSIS — H60501 Unspecified acute noninfective otitis externa, right ear: Secondary | ICD-10-CM

## 2021-01-17 MED ORDER — CIPRO HC 0.2-1 % OT SUSP: 3.0000 [drp] | Freq: Two times a day (BID) | OTIC | 0 refills | Status: DC

## 2021-01-17 MED ORDER — NEOMYCIN-POLYMYXIN-HC 3.5-10000-1 OT SOLN: 4.0000 [drp] | Freq: Four times a day (QID) | OTIC | 0 refills | Status: DC

## 2021-01-17 NOTE — Telephone Encounter (Signed)
Spoke with the patient.. he is aware an alternate medication was sent in.

## 2021-01-17 NOTE — Telephone Encounter (Signed)
Patient is calling back and stated that he was seen today and the prescription that he received today insurance wouldn't cover. Patient wanted to see if provider could recommend an alterative, please advise. CB is [phone]

## 2021-01-17 NOTE — Telephone Encounter (Signed)
Please advise 

## 2021-01-17 NOTE — Telephone Encounter (Signed)
I sent in an alternative prescription to the [facility]

## 2021-01-17 NOTE — Progress Notes (Signed)
Established Patient Office Visit  Subjective:  Patient ID: Francis Campbell., male    DOB: Jul 04, 1946  Age: 75 y.o. MRN: [id number]  CC:  Chief Complaint  Patient presents with  . Ear Pain    R ear, x 2 weeks, sees ENT but could not get an appointment until june    HPI Francis Campbell. presents for 2-week history of right ear pain and some intermittent drainage.  He states he has longstanding history of recurrent otitis externa.  He has seen ENT in the past but states he was told he cannot get in until June.  Denies any fevers or chills.  No recent sinus or nasal congestion.  No acute hearing changes.  No fever or chills.  Past Medical History:  Diagnosis Date  . Allergy   . Aortic regurgitation   . Arthritis   . Asthma    only as child  . Atrial flutter ([place])    a. Dx 09/2012.  . Cataract   . Esophageal reflux    Remotely  . History of kidney stones   . Hypercholesteremia   . Hypertension   . Paroxysmal atrial fibrillation (HCC)    Takes PRN flecainide  . 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  [name] TIA (transient ischemic attack)    ~2005 - had visual symptoms, was told by [name] this may have been a TIA but it's unclear  . Valvular heart disease    Mild AI, ASclerosis without AS by echo 10/2011    Past Surgical History:  Procedure Laterality Date  . CARDIOVERSION N/A 05/08/2013   Procedure: CARDIOVERSION;  Surgeon: Darlin Coco, MD;  Location[facility] ENDOSCOPY;  Service: Cardiovascular;  Laterality: N/A;  . CATARACT EXTRACTION     Right 12/2019  . CATARACT EXTRACTION    . CYSTOSCOPY/RETROGRADE/URETEROSCOPY/STONE EXTRACTION WITH BASKET Right 02/12/2013   Procedure: CYSTOSCOPY/RETROGRADE/URETEROSCOPY/STONE EXTRACTION WITH BASKET, WITH  STENT PLACEMENT RIGHT URETERAL DILATION;  Surgeon: [name], MD;  Location: [facility] ORS;  Service: Urology;  Laterality: Right;  . EYE SURGERY Bilateral 1950, 1960, 1964   left, right, left  . HERNIA  REPAIR  2005  . KNEE SURGERY Bilateral 1990, 2000  . LITHOTRIPSY    . ROTATOR CUFF REPAIR Left 2000  . TONSILLECTOMY      Family History  Problem Relation Age of Onset  . Stroke Mother   . Hypertension Mother   . Kidney disease Mother        stones  . Nephrolithiasis Brother   . Arthritis Paternal Grandmother   . Heart disease Paternal Grandmother   . Leukemia Father   . Heart disease Father   . Nephrolithiasis Father   . Kidney cancer Son   . Appendicitis Son   . Heart attack Neg Hx   . Colon cancer Neg Hx   . Esophageal cancer Neg Hx   . Pancreatic cancer Neg Hx   . Stomach cancer Neg Hx   . Liver disease Neg Hx     Social History   Socioeconomic History  . Marital status: Married    Spouse name: Not on file  . Number of children: 2  . Years of education: Not on file  . Highest education level: Not on file  Occupational History  . Occupation: TECHNICAL SUP    Employer: [employer]  Tobacco Use  . Smoking status: Never Smoker  . Smokeless tobacco: Never Used  Vaping Use  . Vaping Use:  Never used  Substance and Sexual Activity  . Alcohol use: No  . Drug use: No  . Sexual activity: Yes    Birth control/protection: None  Other Topics Concern  . Not on file  Social History Narrative   Non smoker  No ets          Social Determinants of Health   Financial Resource Strain: Low Risk   . Difficulty of Paying Living Expenses: Not hard at all  Food Insecurity: No Food Insecurity  . Worried About Charity fundraiser in the Last Year: Never true  . Ran Out of Food in the Last Year: Never true  Transportation Needs: No Transportation Needs  . Lack of Transportation (Medical): No  . Lack of Transportation (Non-Medical): No  Physical Activity: Inactive  . Days of Exercise per Week: 0 days  . Minutes of Exercise per Session: 0 min  Stress: No Stress Concern Present  . Feeling of Stress : Not at all  Social Connections: Socially Integrated  . Frequency of  Communication with Friends and Family: More than three times a week  . Frequency of Social Gatherings with Friends and Family: More than three times a week  . Attends Religious Services: More than 4 times per year  . Active Member of Clubs or Organizations: Yes  . Attends Archivist Meetings: More than 4 times per year  . Marital Status: Married  Human resources officer Violence: Not At Risk  . Fear of Current or Ex-Partner: No  . Emotionally Abused: No  . Physically Abused: No  . Sexually Abused: No    Outpatient Medications Prior to Visit  Medication Sig Dispense Refill  . ALPRAZolam (XANAX) 0.25 MG tablet TAKE 1 TABLET BY MOUTH AT BEDTIME AS NEEDED FOR ANXIETY OR SLEEP. 30 tablet 0  . Cholecalciferol (VITAMIN D3) 1000 UNITS CAPS Take 2,000 Units by mouth every morning.    . diltiazem (CARTIA XT) 240 MG 24 hr capsule Take 240 mg by mouth in the morning 90 capsule 3  . flecainide (TAMBOCOR) 100 MG tablet TAKE 1 TABLET TWICE DAILY 180 tablet 2  . losartan (COZAAR) 25 MG tablet TAKE 1 TABLET EVERY DAY 90 tablet 3  . metoprolol succinate (TOPROL-XL) 25 MG 24 hr tablet TAKE 1 TABLET EVERY DAY WITH OR IMMEDIATELY FOLLOWING A MEAL 90 tablet 3  . Multiple Vitamin (MULTIVITAMIN) tablet Take 1 tablet by mouth daily.    . potassium citrate (UROCIT-K) 10 MEQ (1080 MG) SR tablet TAKE 1 TABLET THREE TIMES DAILY 270 tablet 3  . rosuvastatin (CRESTOR) 20 MG tablet TAKE 1 TABLET EVERY DAY (NEW DOSE) 90 tablet 3  . sertraline (ZOLOFT) 50 MG tablet TAKE 1 TABLET (50 MG TOTAL) BY MOUTH DAILY. NEEDS OFFICE VISIT FOR FURTHER REFILLS 90 tablet 1  . warfarin (COUMADIN) 5 MG tablet TAKE 1 TO 1 AND 1/2 TABLETS DAILY AS DIRECTED BY COUMADIN CLINIC 135 tablet 1  . Turmeric 500 MG CAPS Take 500 mg by mouth daily.     No facility-administered medications prior to visit.    Allergies  Allergen Reactions  . Chocolate Palpitations  . Chocolate Flavor Shortness Of Breath    Other reaction(s): Respiratory  Distress  . Dextroamphetamine Shortness Of Breath    Other reaction(s): Respiratory Distress  . Dextromethorphan Palpitations  . Flomax [Tamsulosin Hcl] Other (See Comments)    ? Causes A Fib  . Floxin [Ofloxacin] Other (See Comments)    hallucinations  . Guaifenesin Shortness Of Breath  .  Lisinopril Cough  . Tavist Allergy [Clemastine Fumarate] Other (See Comments)    A fib worse  . Clemastine     Other reaction(s): Irregular Heart Rate  . Robitussin Cold+Flu Daytime  [Dm-Phenylephrine-Acetaminophen]     Other reaction(s): Respiratory Distress  . Tamsulosin Other (See Comments)    Other reaction(s): Irregular Heart Rate  . Amiodarone Other (See Comments)    Abn  thyroid  . Robitussin Cf [Pseudoephedrine-Dm-Gg] Palpitations    ROS Review of Systems  Constitutional: Negative for chills and fever.  HENT: Positive for ear discharge and ear pain. Negative for hearing loss.       Objective:    Physical Exam Vitals reviewed.  Constitutional:      Appearance: Normal appearance.  HENT:     Ears:     Comments: Left ear canal and eardrum appear normal.  Right canal reveals edema with moderate erythema and a little bit of exudate within the canal.  No significant cerumen Cardiovascular:     Rate and Rhythm: Normal rate and regular rhythm.     Pulses: Normal pulses.     Heart sounds: Normal heart sounds.  Neurological:     Mental Status: He is alert.     BP 120/60 (BP Location: Left Arm, Patient Position: Sitting, Cuff Size: Normal)   Pulse 60   Temp (!) 97.4 F (36.3 C) (Oral)   Wt 196 lb 14.4 oz (89.3 kg)   SpO2 97%   BMI 25.98 kg/m  Wt Readings from Last 3 Encounters:  01/17/21 196 lb 14.4 oz (89.3 kg)  12/08/20 196 lb (88.9 kg)  05/10/20 194 lb 6.4 oz (88.2 kg)     There are no preventive care reminders to display for this patient.  There are no preventive care reminders to display for this patient.  Lab Results  Component Value Date   TSH 4.07 08/06/2013    Lab Results  Component Value Date   WBC 7.5 11/21/2019   HGB 14.4 11/21/2019   HCT 45.6 11/21/2019   MCV 90.5 11/21/2019   PLT 174 11/21/2019   Lab Results  Component Value Date   NA 142 11/21/2019   K 3.7 11/21/2019   CO2 24 11/21/2019   GLUCOSE 84 11/21/2019   BUN 23 11/21/2019   CREATININE 0.88 11/21/2019   BILITOT 1.2 11/21/2019   ALKPHOS 52 11/21/2019   AST 21 11/21/2019   ALT 24 11/21/2019   PROT 5.7 (L) 11/21/2019   ALBUMIN 3.4 (L) 11/21/2019   CALCIUM 8.3 (L) 11/21/2019   ANIONGAP 7 11/21/2019   GFR 77.14 07/06/2015   Lab Results  Component Value Date   CHOL 165 11/12/2019   Lab Results  Component Value Date   HDL 43 11/12/2019   Lab Results  Component Value Date   LDLCALC 92 11/12/2019   Lab Results  Component Value Date   TRIG 171 (H) 11/12/2019   Lab Results  Component Value Date   CHOLHDL 3.8 11/12/2019   No results found for: HGBA1C    Assessment & Plan:   Problem List Items Addressed This Visit   None   Visit Diagnoses    Acute otitis externa of right ear, unspecified type    -  Primary    -Keep area dry as possible -Start Cipro HC otic 3 drops right ear twice daily -Touch base if not improving next couple weeks  Meds ordered this encounter  Medications  . ciprofloxacin-hydrocortisone (CIPRO HC) OTIC suspension    Sig: Place 3 drops  into the right ear 2 (two) times daily.    Dispense:  10 mL    Refill:  0    Follow-up: No follow-ups on file.    [name], MD

## 2021-01-17 NOTE — Patient Instructions (Signed)
Otitis Externa  Otitis externa is an infection of the outer ear canal. The outer ear canal is the area between the outside of the ear and the eardrum. Otitis externa is sometimes called swimmer's ear. What are the causes? Common causes of this condition include:  Swimming in dirty water.  Moisture in the ear.  An injury to the inside of the ear.  An object stuck in the ear.  A cut or scrape on the outside of the ear. What increases the risk? You are more likely to develop this condition if you go swimming often. What are the signs or symptoms? The first symptom of this condition is often itching in the ear. Later symptoms of the condition include:  Swelling of the ear.  Redness in the ear.  Ear pain. The pain may get worse when you pull on your ear.  Pus coming from the ear. How is this diagnosed? This condition may be diagnosed by examining the ear and testing fluid from the ear for bacteria and funguses. How is this treated? This condition may be treated with:  Antibiotic ear drops. These are often given for 10-14 days.  Medicines to reduce itching and swelling. Follow these instructions at home:  If you were prescribed antibiotic ear drops, use them as told by your health care provider. Do not stop using the antibiotic even if your condition improves.  Take over-the-counter and prescription medicines only as told by your health care provider.  Avoid getting water in your ears as told by your health care provider. This may include avoiding swimming or water sports for a few days.  Keep all follow-up visits as told by your health care provider. This is important. How is this prevented?  Keep your ears dry. Use the corner of a towel to dry your ears after you swim or bathe.  Avoid scratching or putting things in your ear. Doing these things can damage the ear canal or remove the protective wax that lines it, which makes it easier for bacteria and funguses to  grow.  Avoid swimming in lakes, polluted water, or pools that may not have enough chlorine. Contact a health care provider if:  You have a fever.  Your ear is still red, swollen, painful, or draining pus after 3 days.  Your redness, swelling, or pain gets worse.  You have a severe headache.  You have redness, swelling, pain, or tenderness in the area behind your ear. Summary  Otitis externa is an infection of the outer ear canal.  Common causes include swimming in dirty water, moisture in the ear, or a cut or scrape in the ear.  Symptoms include pain, redness, and swelling of the ear.  If you were prescribed antibiotic ear drops, use them as told by your health care provider. Do not stop using the antibiotic even if your condition improves. This information is not intended to replace advice given to you by your health care provider. Make sure you discuss any questions you have with your health care provider. Document Revised: 02/07/2018 Document Reviewed: 02/07/2018 Elsevier Patient Education  2021 [employer].

## 2021-01-26 ENCOUNTER — Other Ambulatory Visit: Payer: Self-pay

## 2021-01-26 ENCOUNTER — Encounter (INDEPENDENT_AMBULATORY_CARE_PROVIDER_SITE_OTHER): Payer: Self-pay | Admitting: Otolaryngology

## 2021-01-26 ENCOUNTER — Ambulatory Visit (INDEPENDENT_AMBULATORY_CARE_PROVIDER_SITE_OTHER): Payer: Medicare Other | Admitting: Otolaryngology

## 2021-01-26 VITALS — Temp 96.8°F

## 2021-01-26 DIAGNOSIS — H6123 Impacted cerumen, bilateral: Secondary | ICD-10-CM

## 2021-01-26 DIAGNOSIS — H60311 Diffuse otitis externa, right ear: Secondary | ICD-10-CM | POA: Diagnosis not present

## 2021-01-26 NOTE — Progress Notes (Signed)
HPI: Francis Doell. is a 75 y.o. male who returns today for evaluation of right ear problems.  He has been having some pain discomfort in the right ear.  He has history of external otitis which has been chronic.  He was last seen about 5 months ago..  Past Medical History:  Diagnosis Date  . Allergy   . Aortic regurgitation   . Arthritis   . Asthma    only as child  . Atrial flutter ([place])    a. Dx 09/2012.  . Cataract   . Esophageal reflux    Remotely  . History of kidney stones   . Hypercholesteremia   . Hypertension   . Paroxysmal atrial fibrillation (HCC)    Takes PRN flecainide  . 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  [name] TIA (transient ischemic attack)    ~2005 - had visual symptoms, was told by [name] this may have been a TIA but it's unclear  . Valvular heart disease    Mild AI, ASclerosis without AS by echo 10/2011   Past Surgical History:  Procedure Laterality Date  . CARDIOVERSION N/A 05/08/2013   Procedure: CARDIOVERSION;  Surgeon: Darlin Coco, MD;  Location: [facility] ENDOSCOPY;  Service: Cardiovascular;  Laterality: N/A;  . CATARACT EXTRACTION     Right 12/2019  . CATARACT EXTRACTION    . CYSTOSCOPY/RETROGRADE/URETEROSCOPY/STONE EXTRACTION WITH BASKET Right 02/12/2013   Procedure: CYSTOSCOPY/RETROGRADE/URETEROSCOPY/STONE EXTRACTION WITH BASKET, WITH  STENT PLACEMENT RIGHT URETERAL DILATION;  Surgeon: [name], MD;  Location: [facility] ORS;  Service: Urology;  Laterality: Right;  . EYE SURGERY Bilateral 1950, 1960, 1964   left, right, left  . HERNIA REPAIR  2005  . KNEE SURGERY Bilateral 1990, 2000  . LITHOTRIPSY    . ROTATOR CUFF REPAIR Left 2000  . TONSILLECTOMY     Social History   Socioeconomic History  . Marital status: Married    Spouse name: Not on file  . Number of children: 2  . Years of education: Not on file  . Highest education level: Not on file  Occupational History  . Occupation: TECHNICAL SUP    Employer:  [employer]  Tobacco Use  . Smoking status: Never Smoker  . Smokeless tobacco: Never Used  Vaping Use  . Vaping Use: Never used  Substance and Sexual Activity  . Alcohol use: No  . Drug use: No  . Sexual activity: Yes    Birth control/protection: None  Other Topics Concern  . Not on file  Social History Narrative   Non smoker  No ets          Social Determinants of Health   Financial Resource Strain: Low Risk   . Difficulty of Paying Living Expenses: Not hard at all  Food Insecurity: No Food Insecurity  . Worried About Charity fundraiser in the Last Year: Never true  . Ran Out of Food in the Last Year: Never true  Transportation Needs: No Transportation Needs  . Lack of Transportation (Medical): No  . Lack of Transportation (Non-Medical): No  Physical Activity: Inactive  . Days of Exercise per Week: 0 days  . Minutes of Exercise per Session: 0 min  Stress: No Stress Concern Present  . Feeling of Stress : Not at all  Social Connections: Socially Integrated  . Frequency of Communication with Friends and Family: More than three times a week  . Frequency of Social Gatherings with Friends and Family: More than three  times a week  . Attends Religious Services: More than 4 times per year  . Active Member of Clubs or Organizations: Yes  . Attends Archivist Meetings: More than 4 times per year  . Marital Status: Married   Family History  Problem Relation Age of Onset  . Stroke Mother   . Hypertension Mother   . Kidney disease Mother        stones  . Nephrolithiasis Brother   . Arthritis Paternal Grandmother   . Heart disease Paternal Grandmother   . Leukemia Father   . Heart disease Father   . Nephrolithiasis Father   . Kidney cancer Son   . Appendicitis Son   . Heart attack Neg Hx   . Colon cancer Neg Hx   . Esophageal cancer Neg Hx   . Pancreatic cancer Neg Hx   . Stomach cancer Neg Hx   . Liver disease Neg Hx    Allergies  Allergen Reactions  .  Chocolate Palpitations  . Chocolate Flavor Shortness Of Breath    Other reaction(s): Respiratory Distress  . Dextroamphetamine Shortness Of Breath    Other reaction(s): Respiratory Distress  . Dextromethorphan Palpitations  . Flomax [Tamsulosin Hcl] Other (See Comments)    ? Causes A Fib  . Floxin [Ofloxacin] Other (See Comments)    hallucinations  . Guaifenesin Shortness Of Breath  . Lisinopril Cough  . Tavist Allergy [Clemastine Fumarate] Other (See Comments)    A fib worse  . Clemastine     Other reaction(s): Irregular Heart Rate  . Robitussin Cold+Flu Daytime  [Dm-Phenylephrine-Acetaminophen]     Other reaction(s): Respiratory Distress  . Tamsulosin Other (See Comments)    Other reaction(s): Irregular Heart Rate  . Amiodarone Other (See Comments)    Abn  thyroid  . Robitussin Cf [Pseudoephedrine-Dm-Gg] Palpitations   Prior to Admission medications   Medication Sig Start Date End Date Taking? Authorizing Provider  ALPRAZolam (XANAX) 0.25 MG tablet TAKE 1 TABLET BY MOUTH AT BEDTIME AS NEEDED FOR ANXIETY OR SLEEP. 01/16/21   Burchette, Alinda Sierras, MD  Cholecalciferol (VITAMIN D3) 1000 UNITS CAPS Take 2,000 Units by mouth every morning.    [provider]  diltiazem (CARTIA XT) 240 MG 24 hr capsule Take 240 mg by mouth in the morning 12/31/19   Skeet Latch, MD  flecainide [employer]) 100 MG tablet TAKE 1 TABLET TWICE DAILY 11/21/20   Skeet Latch, MD  losartan (COZAAR) 25 MG tablet TAKE 1 TABLET EVERY DAY 11/21/20   Skeet Latch, MD  metoprolol succinate (TOPROL-XL) 25 MG 24 hr tablet TAKE 1 TABLET EVERY DAY WITH OR IMMEDIATELY FOLLOWING A MEAL 11/21/20   Skeet Latch, MD  Multiple Vitamin (MULTIVITAMIN) tablet Take 1 tablet by mouth daily.    [provider]  neomycin-polymyxin-hydrocortisone (CORTISPORIN) OTIC solution Place 4 drops into the right ear 4 (four) times daily. 01/17/21   Burchette, Alinda Sierras, MD  potassium citrate (UROCIT-K) 10 MEQ (1080 MG) SR  tablet TAKE 1 TABLET THREE TIMES DAILY 07/11/20   Irine Seal, MD  rosuvastatin (CRESTOR) 20 MG tablet TAKE 1 TABLET EVERY DAY (NEW DOSE) 11/21/20   Skeet Latch, MD  sertraline (ZOLOFT) 50 MG tablet TAKE 1 TABLET (50 MG TOTAL) BY MOUTH DAILY. NEEDS OFFICE VISIT FOR FURTHER REFILLS 04/15/20   [name], MD  Turmeric 500 MG CAPS Take 500 mg by mouth daily.    [provider]  warfarin (COUMADIN) 5 MG tablet TAKE 1 TO 1 AND 1/2 TABLETS DAILY  AS DIRECTED BY COUMADIN CLINIC 11/22/20   Skeet Latch, MD  digoxin (LANOXIN) 0.125 MG tablet Take 1 tablet (125 mcg total) by mouth daily. 11/26/11 11/27/11  Darlin Coco, MD  losartan-hydrochlorothiazide [facility]) 100-12.5 MG per tablet  08/26/11 11/27/11  [provider]  pantoprazole (PROTONIX) 40 MG tablet Take 1 tablet (40 mg total) by mouth daily. 10/29/11 11/27/11  Sable Feil, MD     Positive ROS: Otherwise negative  All other systems have been reviewed and were otherwise negative with the exception of those mentioned in the HPI and as above.  Physical Exam: Constitutional: Alert, well-appearing, no acute distress Ears: External ears without lesions or tenderness.  Left ear canal with minimal wax buildup that was removed with a curette.  Ear canal and TM are otherwise clear with no signs of infection.  Right ear canal with inflammatory changes and a small amount of debris and drainage.  Several hairs were removed in addition to some hairs down in the ear canal that were removed with suction.  Ear was irrigated and cleaned with suction and hydroperoxide and alcohol.  After cleaning the ear canal I applied gentian violet and CSF powder to the right ear only.  The right TM was intact and clear otherwise. Nasal: External nose without lesions. . Clear nasal passages Oral: Lips and gums without lesions. Tongue and palate mucosa without lesions. Posterior oropharynx clear. Neck: No palpable adenopathy or  masses Respiratory: Breathing comfortably  Skin: No facial/neck lesions or rash noted.  Cerumen impaction removal  Date/Time: 01/26/2021 3:09 PM Performed by: Rozetta Nunnery, MD Authorized by: Rozetta Nunnery, MD   Consent:    Consent obtained:  Verbal   Consent given by:  Patient   Risks discussed:  Pain and bleeding Procedure details:    Location:  L ear and R ear   Procedure type: curette and suction   Post-procedure details:    Inspection:  TM intact and canal normal   Hearing quality:  Improved   Patient tolerance of procedure:  Tolerated well, no immediate complications Comments:     Left ear canal with minimal wax buildup that was removed with a curette.  The right ear canal with chronic right external otitis that was cleaned with hydroperoxide and alcohol irrigations and suction.  After cleaning the right ear canal I applied gentian violet, and CSF powder to the right ear.    Assessment: Minimal wax buildup on both sides. Recurrent or chronic right external otitis.  Plan: Right ear canal was cleaned with hydroperoxide and alcohol or ear rinses and then applied gentian violet and CSF powder to the right ear only. He would like to follow-up in 4 months for recheck and cleaning. Recommended keeping the ear dry for the next week. He has Cortisporin drops he can use if develops any pain itching or drainage from the ear.   Radene Journey, MD

## 2021-02-15 ENCOUNTER — Other Ambulatory Visit: Payer: Self-pay | Admitting: Family Medicine

## 2021-02-15 NOTE — Telephone Encounter (Signed)
Last filled 01/16/2021 Last OV 01/26/2021  Ok to refill?

## 2021-02-16 ENCOUNTER — Telehealth: Payer: Self-pay | Admitting: Pharmacist

## 2021-02-16 NOTE — Chronic Care Management (AMB) (Signed)
    Chronic Care Management Pharmacy Assistant   Name: Francis Campbell.  MRN: [id number] DOB: [birth year]  02/16/21- Called patient to remind of appointment with Jeni Salles) on (02/17/21 by phone at 12:30pm)  Patient aware of appointment date, time, and type of appointment (either telephone or in person). Patient aware to have/bring all medications, supplements, blood pressure and/or blood sugar logs to visit.  Questions: Have you had any recent office visit or specialist visit outside of [place]? No. Are there any concerns you would like to discuss during your office visit? No.  Are you having any problems obtaining your medications? (Whether it pharmacy issues or cost) No. If patient has any PAP medications ask if they are having any problems getting their PAP medication or refill? Does not receive PAP.   Star Rating Drug:   Losartan 49m - [facility] Rosuvastatin 264m- last filled on 10/13/20 30DS at [facility]  Any gaps in medications fill history? Yes.   Ch[place]aranacClinical Pharmacist Assistant ([phone]

## 2021-02-17 ENCOUNTER — Ambulatory Visit (INDEPENDENT_AMBULATORY_CARE_PROVIDER_SITE_OTHER): Payer: Medicare Other | Admitting: Pharmacist

## 2021-02-17 DIAGNOSIS — E7800 Pure hypercholesterolemia, unspecified: Secondary | ICD-10-CM | POA: Diagnosis not present

## 2021-02-17 DIAGNOSIS — I480 Paroxysmal atrial fibrillation: Secondary | ICD-10-CM

## 2021-02-17 DIAGNOSIS — H9209 Otalgia, unspecified ear: Secondary | ICD-10-CM

## 2021-02-17 NOTE — Progress Notes (Signed)
Chronic Care Management Pharmacy Note  02/20/2021 Name:  Elster Corbello. MRN:  [id number] DOB:  11-Jan-1946  Summary: BP at goal <140/90 per home and office readings  Recommendations/Changes made from today's visit: -Recommend repeat lipid panel as patient is overdue -Requested referral for new ENT -Cautioned to monitor bleeding with concurrent use of warfarin and turmeric  Plan: -Follow up in 6 months  Subjective: Mycah Formica. is an 75 y.o. year old male who is a primary patient of Burchette, Alinda Sierras, MD.  The CCM team was consulted for assistance with disease management and care coordination needs.    Engaged with patient by telephone for follow up visit in response to provider referral for pharmacy case management and/or care coordination services.   Consent to Services:  The patient was given information about Chronic Care Management services, agreed to services, and gave verbal consent prior to initiation of services.  Please see initial visit note for detailed documentation.   Patient Care Team: [name], MD as PCP - General (Family Medicine) Skeet Latch, MD as PCP - Cardiology (Cardiology) Johnathan Hausen, MD as Consulting Physician (General Surgery) Milus Banister, MD as Attending Physician (Gastroenterology) Irine Seal, MD as Attending Physician (Urology) Viona Gilmore, [facility] as Pharmacist (Pharmacist)  Recent office visits: 01/17/21 [name], MD: Patient presented for ear infection. Prescribed cipro HC.  Recent consult visits: 01/26/21 Melony Overly, MD (otolaryngology): Patient presented for cerumen impaction removal.   01/11/21 Frederik Schmidt, RN (cardiology): Patient presented for anti-coag visit. INR 2.3, goal 2-3. Continued warfarin 7.5 mg (5 mg x 1.5) every Mon, Wed, Fri; 5 mg (5 mg x 1) all other days.   12/08/20 Tye Savoy, NP (gastro): Patient presented with abdominal pain.   10/05/20 Irine Seal (urology): Unable to  access notes.  Hospital visits: None in previous 6 months   Objective:  Lab Results  Component Value Date   CREATININE 0.88 11/21/2019   BUN 23 11/21/2019   GFR 77.14 07/06/2015   GFRNONAA >60 11/21/2019   GFRAA >60 11/21/2019   NA 142 11/21/2019   K 3.7 11/21/2019   CALCIUM 8.3 (L) 11/21/2019   CO2 24 11/21/2019   GLUCOSE 84 11/21/2019    Lab Results  Component Value Date/Time   GFR 77.14 07/06/2015 03:07 PM   GFR 82.79 04/08/2015 11:57 AM    Last diabetic Eye exam: No results found for: HMDIABEYEEXA  Last diabetic Foot exam: No results found for: HMDIABFOOTEX   Lab Results  Component Value Date   CHOL 165 11/12/2019   HDL 43 11/12/2019   LDLCALC 92 11/12/2019   LDLDIRECT 96.0 10/07/2017   TRIG 171 (H) 11/12/2019   CHOLHDL 3.8 11/12/2019    Hepatic Function Latest Ref Rng & Units 11/21/2019 11/20/2019 11/12/2019  Total Protein 6.5 - 8.1 g/dL 5.7(L) 7.7 6.7  Albumin 3.5 - 5.0 g/dL 3.4(L) 4.7 4.5  AST 15 - 41 U/L 21 29 26   ALT 0 - 44 U/L 24 34 33  Alk Phosphatase 38 - 126 U/L 52 69 83  Total Bilirubin 0.3 - 1.2 mg/dL 1.2 1.0 0.6  Bilirubin, Direct 0.0 - 0.3 mg/dL - - -    Lab Results  Component Value Date/Time   TSH 4.07 08/06/2013 09:37 AM   TSH 2.984 10/03/2012 08:42 PM    CBC Latest Ref Rng & Units 11/21/2019 11/20/2019 04/02/2018  WBC 4.0 - 10.5 K/uL 7.5 18.5(H) 7.0  Hemoglobin 13.0 - 17.0 g/dL 14.4 18.2(H) 16.4  Hematocrit  39.0 - 52.0 % 45.6 55.7(H) 49.4  Platelets 150 - 400 K/uL 174 221 182.0    No results found for: VD25OH  Clinical ASCVD: No  The 10-year ASCVD risk score Mikey Bussing DC Jr., et al., 2013) is: 22.4%   Values used to calculate the score:     Age: 62 years     Sex: Male     Is Non-Hispanic African American: No     Diabetic: No     Tobacco smoker: No     Systolic Blood Pressure: 683 mmHg     Is BP treated: Yes     HDL Cholesterol: 43 mg/dL     Total Cholesterol: 165 mg/dL    Depression screen [facility] 2/9 05/04/2020 10/11/2016 10/11/2016   Decreased Interest 0 0 0  Down, Depressed, Hopeless 0 0 0  PHQ - 2 Score 0 0 0  Altered sleeping 0 - -  Tired, decreased energy 0 - -  Change in appetite 0 - -  Feeling bad or failure about yourself  0 - -  Trouble concentrating 0 - -  Moving slowly or fidgety/restless 0 - -  Suicidal thoughts 0 - -  PHQ-9 Score 0 - -  Difficult doing work/chores Not difficult at all - -  Some recent data might be hidden     CHA2DS2/VAS Stroke Risk Points  Current as of 2 hours ago     2 >= 2 Points: High Risk  1 - 1.99 Points: Medium Risk  0 Points: Low Risk    Last Change: N/A      Details    This score determines the patient's risk of having a stroke if the  patient has atrial fibrillation.       Points Metrics  0 Has Congestive Heart Failure:  No    Current as of 2 hours ago  0 Has Vascular Disease:  No    Current as of 2 hours ago  1 Has Hypertension:  Yes    Current as of 2 hours ago  1 Age:  21    Current as of 2 hours ago  0 Has Diabetes:  No    Current as of 2 hours ago  0 Had Stroke:  No  Had TIA:  No  Had Thromboembolism:  No    Current as of 2 hours ago  0 Male:  No    Current as of 2 hours ago       Social History   Tobacco Use  Smoking Status Never Smoker  Smokeless Tobacco Never Used   BP Readings from Last 3 Encounters:  01/17/21 120/60  12/08/20 112/60  05/10/20 124/70   Pulse Readings from Last 3 Encounters:  01/17/21 60  12/08/20 80  05/10/20 60   Wt Readings from Last 3 Encounters:  01/17/21 196 lb 14.4 oz (89.3 kg)  12/08/20 196 lb (88.9 kg)  05/10/20 194 lb 6.4 oz (88.2 kg)   BMI Readings from Last 3 Encounters:  01/17/21 25.98 kg/m  12/08/20 25.86 kg/m  05/10/20 25.65 kg/m    Assessment/Interventions: Review of patient past medical history, allergies, medications, health status, including review of consultants reports, laboratory and other test data, was performed as part of comprehensive evaluation and provision of chronic care  management services.   SDOH:  (Social Determinants of Health) assessments and interventions performed: No  SDOH Screenings   Alcohol Screen: Low Risk   . Last Alcohol Screening Score (AUDIT): 0  Depression (PHQ2-9): Low Risk   .  PHQ-2 Score: 0  Financial Resource Strain: Low Risk   . Difficulty of Paying Living Expenses: Not hard at all  Food Insecurity: No Food Insecurity  . Worried About Charity fundraiser in the Last Year: Never true  . Ran Out of Food in the Last Year: Never true  Housing: Low Risk   . Last Housing Risk Score: 0  Physical Activity: Inactive  . Days of Exercise per Week: 0 days  . Minutes of Exercise per Session: 0 min  Social Connections: Socially Integrated  . Frequency of Communication with Friends and Family: More than three times a week  . Frequency of Social Gatherings with Friends and Family: More than three times a week  . Attends Religious Services: More than 4 times per year  . Active Member of Clubs or Organizations: Yes  . Attends Archivist Meetings: More than 4 times per year  . Marital Status: Married  Stress: No Stress Concern Present  . Feeling of Stress : Not at all  Tobacco Use: Low Risk   . Smoking Tobacco Use: Never Smoker  . Smokeless Tobacco Use: Never Used  Transportation Needs: No Transportation Needs  . Lack of Transportation (Medical): No  . Lack of Transportation (Non-Medical): No    CCM Care Plan  Allergies  Allergen Reactions  . Chocolate Palpitations  . Chocolate Flavor Shortness Of Breath    Other reaction(s): Respiratory Distress  . Dextroamphetamine Shortness Of Breath    Other reaction(s): Respiratory Distress  . Dextromethorphan Palpitations  . Flomax [Tamsulosin Hcl] Other (See Comments)    ? Causes A Fib  . Floxin [Ofloxacin] Other (See Comments)    hallucinations  . Guaifenesin Shortness Of Breath  . Lisinopril Cough  . Tavist Allergy [Clemastine Fumarate] Other (See Comments)    A fib worse   . Clemastine     Other reaction(s): Irregular Heart Rate  . Robitussin Cold+Flu Daytime  [Dm-Phenylephrine-Acetaminophen]     Other reaction(s): Respiratory Distress  . Tamsulosin Other (See Comments)    Other reaction(s): Irregular Heart Rate  . Amiodarone Other (See Comments)    Abn  thyroid  . Robitussin Cf [Pseudoephedrine-Dm-Gg] Palpitations    Medications Reviewed Today    Reviewed by Macy Mis, CMA (Certified Medical Assistant) on 02/20/21 at [place] List Status: <None>  Medication Order Taking? Sig Documenting Provider Last Dose Status Informant  ALPRAZolam (XANAX) 0.25 MG tablet [id number] No TAKE 1 TABLET BY MOUTH AT BEDTIME AS NEEDED FOR ANXIETY OR SLEEP. [name], MD Taking Active   Cholecalciferol (VITAMIN D3) 1000 UNITS CAPS [id number] No Take 2,000 Units by mouth every morning. [provider] Taking Active Self        Discontinued 11/27/11 0904 (Error)   diltiazem (CARTIA XT) 240 MG 24 hr capsule [id number] No Take 240 mg by mouth in the morning Skeet Latch, MD Taking Active   flecainide (TAMBOCOR) 100 MG tablet [id number] No TAKE 1 TABLET TWICE DAILY Skeet Latch, MD Taking Active   losartan (COZAAR) 25 MG tablet [id number] No TAKE 1 TABLET EVERY DAY Skeet Latch, MD Taking Active         Discontinued 11/27/11 [phone] (Error)   metoprolol succinate (TOPROL-XL) 25 MG 24 hr tablet [id number] No TAKE 1 TABLET EVERY DAY WITH OR IMMEDIATELY FOLLOWING A MEAL Skeet Latch, MD Taking Active   Multiple Vitamin (MULTIVITAMIN) tablet [id number] No Take 1 tablet by mouth daily. [provider] Taking Active Self  Med Note Wyatt Portela, ANAIS   Fri Jun 24, 2015  8:01 AM)     neomycin-polymyxin-hydrocortisone (CORTISPORIN) OTIC solution [id number] No Place 4 drops into the right ear 4 (four) times daily. [name], MD Taking Active         Discontinued 11/27/11 0843   potassium citrate (UROCIT-K) 10 MEQ (1080 MG) SR tablet  [id number] No TAKE 1 TABLET THREE TIMES DAILY Irine Seal, MD Taking Active   rosuvastatin (CRESTOR) 20 MG tablet [id number] No TAKE 1 TABLET EVERY DAY (NEW DOSE) Skeet Latch, MD Taking Active   sertraline (ZOLOFT) 50 MG tablet [id number] No TAKE 1 TABLET (50 MG TOTAL) BY MOUTH DAILY. NEEDS OFFICE VISIT FOR FURTHER REFILLS [name], MD Taking Active   Turmeric 500 MG CAPS [id number] No Take 500 mg by mouth daily. [provider] Taking Active Self  warfarin (COUMADIN) 5 MG tablet [id number] No TAKE 1 TO 1 AND 1/2 TABLETS DAILY AS DIRECTED BY COUMADIN CLINIC Skeet Latch, MD Taking Active           Patient Active Problem List   Diagnosis Date Noted  . Irregular bowel habits 11/21/2019  . SBO (small bowel obstruction) ([place]) 11/20/2019  . History of kidney stones 04/02/2018  . Chronic insomnia 04/07/2017  . Epigastric discomfort 11/23/2014  . LLQ discomfort 11/23/2014  . Encounter for therapeutic drug monitoring 10/22/2013  . BPH associated with nocturia 08/06/2013  . Atrial flutter ([place]) 11/17/2012  . Anxiety 10/03/2012  . Benign hypertensive heart disease without heart failure 11/28/2011  . Regional enteritis of unspecified site 11/07/2011  . Special screening for malignant neoplasms, colon 11/07/2011  . Duodenitis 11/07/2011  . Depression, recurrent ([place]) 11/06/2011  . Abdominal pain 10/22/2011  . Crohn disease ([place]) 10/22/2011  . Anticoagulated on Coumadin 10/22/2011  . AF (atrial fibrillation) ([place]) 10/22/2011  . Chest pain radiating to arm 09/14/2011  . Leucocytosis 09/09/2011  . Cough, persistent 07/30/2011  . Cough 07/30/2011  . Paroxysmal atrial fibrillation ([facility])   . Pure hypercholesterolemia   . Aortic insufficiency   . Systolic murmur     Immunization History  Administered Date(s) Administered  . Fluad Quad(high Dose 65+) 05/28/2019, 06/13/2020  . Influenza Split 07/30/2011, 07/06/2013  . Influenza, High Dose Seasonal PF 05/26/2015,  05/18/2016, 06/18/2017, 07/07/2018  . Influenza,inj,Quad PF,6+ Mos 06/16/2014  . PFIZER(Purple Top)SARS-COV-2 Vaccination 10/22/2019, 11/17/2019, 06/14/2020, 12/30/2020  . Pneumococcal Conjugate-13 09/17/2010, 08/31/2013  . Pneumococcal Polysaccharide-23 04/08/2015  . Tdap 11/27/2011  . Zoster, Live 09/28/2010   Patient and his wife requested a referral for a new ENT. They were not satisfied with the services from Dr. Lucia Gaskins.  Conditions to be addressed/monitored:  Hypertension, Hyperlipidemia, Atrial Fibrillation, Depression, Anxiety and Insomnia  Care Plan : [place]  Updates made by Viona Gilmore, [place] since 02/20/2021 12:00 AM    Problem: Problem: Hypertension, Hyperlipidemia, Atrial Fibrillation, Depression, Anxiety and Insomnia     Long-Range Goal: Patient-Specific Goal   Start Date: 02/17/2021  Expected End Date: 02/17/2022  This Visit's Progress: On track  Priority: High  Note:   Current Barriers:  . Unable to independently monitor therapeutic efficacy  Pharmacist Clinical Goal(s):  [name] Patient will achieve adherence to monitoring guidelines and medication adherence to achieve therapeutic efficacy through collaboration with PharmD and provider.   Interventions: . 1:1 collaboration with [name], MD regarding development and update of comprehensive plan of care as evidenced by provider attestation and co-signature . Inter-disciplinary care team collaboration (see longitudinal plan of  care) . Comprehensive medication review performed; medication list updated in electronic medical record  Hypertension (BP goal <140/90) -Controlled -Current treatment:  Losartan 25 mg 1 tablet daily    Diltiazem 240 mg 1 capsule in the morning  Metoprolol succinate 25 mg 1 tablet daily - in AM -Medications previously tried: none  -Current home readings: 115/70 (checking twice a day) -Current dietary habits: did not discuss -Current exercise habits: did not  discuss -Denies hypotensive/hypertensive symptoms -Educated on BP goals and benefits of medications for prevention of heart attack, stroke and kidney damage; Exercise goal of 150 minutes per week; Importance of home blood pressure monitoring; Proper BP monitoring technique; -Counseled to monitor BP at home weekly, document, and provide log at future appointments -Counseled on diet and exercise extensively Recommended to continue current medication  Hyperlipidemia: (LDL goal < 100) -Controlled -Current treatment: . Rosuvastatin 20 mg 1 tablet daily -Medications previously tried: none  -Current dietary patterns: did not discuss -Current exercise habits: did not discuss -Educated on Cholesterol goals;  Importance of limiting foods high in cholesterol; Exercise goal of 150 minutes per week; -Counseled on diet and exercise extensively Recommended to continue current medication Recommended repeat lipid panel with cardiology.  Atrial Fibrillation (Goal: prevent stroke and major bleeding) -Controlled -CHADSVASC: 2 -Current treatment:  Rate/rhythm control: Diltiazem 240 mg 1 capsule in the morning; Metoprolol succinate 25 mg 1 tablet daily in morning; Flecainide 100 mg 1 tablet twice daily . Anticoagulation: Warfarin 5 mg, 7.5 mg on Mon, Wed and Fri and 5 mg on all other days -Medications previously tried: none -Home BP and HR readings: 70s  -Counseled on importance of adherence to anticoagulant exactly as prescribed; avoidance of NSAIDs due to increased bleeding risk with anticoagulants; -Counseled on diet and exercise extensively Recommended to continue current medication  Depression/Anxiety (Goal: minimize symptoms) -Controlled -Current treatment: . Sertraline 50 mg 1 tablet daily -Medications previously tried/failed: none -PHQ9: 0 -GAD7: n/a -Educated on Benefits of medication for symptom control -Recommended to continue current medication  Insomnia (Goal: improve quality  and quantity of sleep) -Not ideally controlled -Current treatment  . Alprazolam 0.25 mg 1 tablet at bedtime as needed -Medications previously tried: none  -Counseled on long term risks of taking benzodiazepines such as increased risk for bone fractures and memory loss - patient reports taking almost every night  BPH (Goal: minimize symptoms) -Controlled -Current treatment  . No medications -Medications previously tried: Alfuzosin (flare up)  -Recommended to continue as is   Health Maintenance -Vaccine gaps: Shingrix -Current therapy:   Vitamin D 2000 units 1 capsule daily  Multivitamin 1 tablet daily  Potassium citrate 10 mEq 1 tablet 3 times daily (urology)  Turmeric 500 mg 1 capsule daily - bleeding risk -Educated on Cost vs benefit of each product must be carefully weighed by individual consumer -Patient is satisfied with current therapy and denies issues - Patient had stopped Turmeric due to bleed risk but is going to restart because he found benefit with it. Cautioned him to monitor for bleeding.  Patient Goals/Self-Care Activities . Patient will:  - check blood pressure weekly, document, and provide at future appointments target a minimum of 150 minutes of moderate intensity exercise weekly  Follow Up Plan: Telephone follow up appointment with care management team member scheduled for: 6 months       Medication Assistance: None required.  Patient affirms current coverage meets needs.  Compliance/Adherence/Medication fill history: Care Gaps: Shingrix  Star-Rating Drugs: Losartan - last filled 11/21/20 90 ds  at [facility] Rosuvastatin -  last dispensed 11/22/20 90 ds at [facility]  Patient's preferred pharmacy is:  [id number] [place] [place]Phone: [phone]: [facility] [address] [place]Phone: [phone]: [phone] Uses pill box? Yes Pt endorses 100% compliance  We discussed: Current pharmacy is preferred with insurance plan and patient is satisfied with pharmacy services Patient decided to: Continue current medication management strategy  Care Plan and Follow Up Patient Decision:  Patient agrees to Care Plan and Follow-up.  Plan: Telephone follow up appointment with care management team member scheduled for:  6 months  MJeni Salles PharmD BBrookdalePharmacist LDel Mar Heightsat [phone]

## 2021-02-20 ENCOUNTER — Ambulatory Visit (INDEPENDENT_AMBULATORY_CARE_PROVIDER_SITE_OTHER): Payer: Medicare Other | Admitting: Otolaryngology

## 2021-02-20 ENCOUNTER — Other Ambulatory Visit: Payer: Self-pay

## 2021-02-20 VITALS — Temp 97.0°F

## 2021-02-20 DIAGNOSIS — H60311 Diffuse otitis externa, right ear: Secondary | ICD-10-CM | POA: Diagnosis not present

## 2021-02-20 DIAGNOSIS — H6121 Impacted cerumen, right ear: Secondary | ICD-10-CM | POA: Diagnosis not present

## 2021-02-20 NOTE — Progress Notes (Signed)
HPI: Francis Campbell. is a 75 y.o. male who returns today for evaluation of draining and itching mostly in the right ear.  He had been seen previously about a month ago.  He has had intermittent problems with drainage mostly from the right ear with itching from the ear.  I seen him previously several months ago with a mild external otitis and treated him with antibiotic eardrops and gentian violet at that time and he seemed to respond well.  I have Last visit on 5/12 I treated him similarly but he seemed to have drainage return earlier.  He has been using eardrops continue to have some discomfort and presents today for recheck and cleaning.  Complains of mostly drainage from the right ear as well as some itching in the right ear.  Past Medical History:  Diagnosis Date  . Allergy   . Aortic regurgitation   . Arthritis   . Asthma    only as child  . Atrial flutter ([place])    a. Dx 09/2012.  . Cataract   . Esophageal reflux    Remotely  . History of kidney stones   . Hypercholesteremia   . Hypertension   . Paroxysmal atrial fibrillation (HCC)    Takes PRN flecainide  . 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  [name] TIA (transient ischemic attack)    ~2005 - had visual symptoms, was told by [name] this may have been a TIA but it's unclear  . Valvular heart disease    Mild AI, ASclerosis without AS by echo 10/2011   Past Surgical History:  Procedure Laterality Date  . CARDIOVERSION N/A 05/08/2013   Procedure: CARDIOVERSION;  Surgeon: Darlin Coco, MD;  Location: [facility] ENDOSCOPY;  Service: Cardiovascular;  Laterality: N/A;  . CATARACT EXTRACTION     Right 12/2019  . CATARACT EXTRACTION    . CYSTOSCOPY/RETROGRADE/URETEROSCOPY/STONE EXTRACTION WITH BASKET Right 02/12/2013   Procedure: CYSTOSCOPY/RETROGRADE/URETEROSCOPY/STONE EXTRACTION WITH BASKET, WITH  STENT PLACEMENT RIGHT URETERAL DILATION;  Surgeon: [name], MD;  Location: [facility] ORS;  Service: Urology;   Laterality: Right;  . EYE SURGERY Bilateral 1950, 1960, 1964   left, right, left  . HERNIA REPAIR  2005  . KNEE SURGERY Bilateral 1990, 2000  . LITHOTRIPSY    . ROTATOR CUFF REPAIR Left 2000  . TONSILLECTOMY     Social History   Socioeconomic History  . Marital status: Married    Spouse name: Not on file  . Number of children: 2  . Years of education: Not on file  . Highest education level: Not on file  Occupational History  . Occupation: TECHNICAL SUP    Employer: [employer]  Tobacco Use  . Smoking status: Never Smoker  . Smokeless tobacco: Never Used  Vaping Use  . Vaping Use: Never used  Substance and Sexual Activity  . Alcohol use: No  . Drug use: No  . Sexual activity: Yes    Birth control/protection: None  Other Topics Concern  . Not on file  Social History Narrative   Non smoker  No ets          Social Determinants of Health   Financial Resource Strain: Low Risk   . Difficulty of Paying Living Expenses: Not hard at all  Food Insecurity: No Food Insecurity  . Worried About Charity fundraiser in the Last Year: Never true  . Ran Out of Food in the Last Year: Never true  Transportation Needs:  No Transportation Needs  . Lack of Transportation (Medical): No  . Lack of Transportation (Non-Medical): No  Physical Activity: Inactive  . Days of Exercise per Week: 0 days  . Minutes of Exercise per Session: 0 min  Stress: No Stress Concern Present  . Feeling of Stress : Not at all  Social Connections: Socially Integrated  . Frequency of Communication with Friends and Family: More than three times a week  . Frequency of Social Gatherings with Friends and Family: More than three times a week  . Attends Religious Services: More than 4 times per year  . Active Member of Clubs or Organizations: Yes  . Attends Archivist Meetings: More than 4 times per year  . Marital Status: Married   Family History  Problem Relation Age of Onset  . Stroke Mother   .  Hypertension Mother   . Kidney disease Mother        stones  . Nephrolithiasis Brother   . Arthritis Paternal Grandmother   . Heart disease Paternal Grandmother   . Leukemia Father   . Heart disease Father   . Nephrolithiasis Father   . Kidney cancer Son   . Appendicitis Son   . Heart attack Neg Hx   . Colon cancer Neg Hx   . Esophageal cancer Neg Hx   . Pancreatic cancer Neg Hx   . Stomach cancer Neg Hx   . Liver disease Neg Hx    Allergies  Allergen Reactions  . Chocolate Palpitations  . Chocolate Flavor Shortness Of Breath    Other reaction(s): Respiratory Distress  . Dextroamphetamine Shortness Of Breath    Other reaction(s): Respiratory Distress  . Dextromethorphan Palpitations  . Flomax [Tamsulosin Hcl] Other (See Comments)    ? Causes A Fib  . Floxin [Ofloxacin] Other (See Comments)    hallucinations  . Guaifenesin Shortness Of Breath  . Lisinopril Cough  . Tavist Allergy [Clemastine Fumarate] Other (See Comments)    A fib worse  . Clemastine     Other reaction(s): Irregular Heart Rate  . Robitussin Cold+Flu Daytime  [Dm-Phenylephrine-Acetaminophen]     Other reaction(s): Respiratory Distress  . Tamsulosin Other (See Comments)    Other reaction(s): Irregular Heart Rate  . Amiodarone Other (See Comments)    Abn  thyroid  . Robitussin Cf [Pseudoephedrine-Dm-Gg] Palpitations   Prior to Admission medications   Medication Sig Start Date End Date Taking? Authorizing Provider  ALPRAZolam (XANAX) 0.25 MG tablet TAKE 1 TABLET BY MOUTH AT BEDTIME AS NEEDED FOR ANXIETY OR SLEEP. 02/16/21   Burchette, Alinda Sierras, MD  Cholecalciferol (VITAMIN D3) 1000 UNITS CAPS Take 2,000 Units by mouth every morning.    [provider]  diltiazem (CARTIA XT) 240 MG 24 hr capsule Take 240 mg by mouth in the morning 12/31/19   Skeet Latch, MD  flecainide [facility]) 100 MG tablet TAKE 1 TABLET TWICE DAILY 11/21/20   Skeet Latch, MD  losartan (COZAAR) 25 MG tablet TAKE 1 TABLET  EVERY DAY 11/21/20   Skeet Latch, MD  metoprolol succinate (TOPROL-XL) 25 MG 24 hr tablet TAKE 1 TABLET EVERY DAY WITH OR IMMEDIATELY FOLLOWING A MEAL 11/21/20   Skeet Latch, MD  Multiple Vitamin (MULTIVITAMIN) tablet Take 1 tablet by mouth daily.    [provider]  neomycin-polymyxin-hydrocortisone (CORTISPORIN) OTIC solution Place 4 drops into the right ear 4 (four) times daily. 01/17/21   Burchette, Alinda Sierras, MD  potassium citrate (UROCIT-K) 10 MEQ (1080 MG) SR tablet TAKE 1 TABLET  THREE TIMES DAILY 07/11/20   Irine Seal, MD  rosuvastatin (CRESTOR) 20 MG tablet TAKE 1 TABLET EVERY DAY (NEW DOSE) 11/21/20   Skeet Latch, MD  sertraline (ZOLOFT) 50 MG tablet TAKE 1 TABLET (50 MG TOTAL) BY MOUTH DAILY. NEEDS OFFICE VISIT FOR FURTHER REFILLS 04/15/20   [name], MD  Turmeric 500 MG CAPS Take 500 mg by mouth daily.    [provider]  warfarin (COUMADIN) 5 MG tablet TAKE 1 TO 1 AND 1/2 TABLETS DAILY AS DIRECTED BY COUMADIN CLINIC 11/22/20   Skeet Latch, MD  digoxin (LANOXIN) 0.125 MG tablet Take 1 tablet (125 mcg total) by mouth daily. 11/26/11 11/27/11  Darlin Coco, MD  losartan-hydrochlorothiazide [facility]) 100-12.5 MG per tablet  08/26/11 11/27/11  [provider]  pantoprazole (PROTONIX) 40 MG tablet Take 1 tablet (40 mg total) by mouth daily. 10/29/11 11/27/11  Sable Feil, MD     Positive ROS: Otherwise negative  All other systems have been reviewed and were otherwise negative with the exception of those mentioned in the HPI and as above.  Physical Exam: Constitutional: Alert, well-appearing, no acute distress Ears: External ears without lesions or tenderness. Ear canals are relatively clear bilaterally although he has some moisture in the right ear canal more so than the right left.  The left ear canal and left TM are relatively clear.  Has large amount of hairs in both ear canals.  The right ear canal was cleaned copiously with  hydroperoxide and alcohol or ear rinses and after cleaning the right ear canal I applied gentian violet, Floxin and CSF powder to the right ear only. Nasal: External nose without lesions.. Clear nasal passages Oral: Lips and gums without lesions. Tongue and palate mucosa without lesions. Posterior oropharynx clear. Neck: No palpable adenopathy or masses Respiratory: Breathing comfortably  Skin: No facial/neck lesions or rash noted.  Cerumen impaction removal  Date/Time: 02/20/2021 1:44 PM Performed by: Rozetta Nunnery, MD Authorized by: Rozetta Nunnery, MD   Consent:    Consent obtained:  Verbal   Consent given by:  Patient   Risks discussed:  Pain and bleeding Procedure details:    Location:  L ear and R ear   Procedure type: irrigation and suction   Post-procedure details:    Inspection:  TM intact and canal normal   Hearing quality:  Improved   Patient tolerance of procedure:  Tolerated well, no immediate complications Comments:     The right ear canal was cleaned with suction.  He had minimal debris within the right ear canal that was cleaned copiously with hydroperoxide, alcohol.  After cleaning the ear canal I applied gentian violet, Floxin drops.  And then applied CSF HC powder to the right ear canal only.    Assessment: Chronic right external otitis which is fairly mild today. Minimal disease on exam in the office today.  Plan: Recommended keeping the ear canal dry for the next week and not using any eardrops. Discussed with him concerning use of eardrops only if he has any drainage from the ear after a week. Also discussed with him concerning use of alcohol vinegar ear rinses versus prescription eardrops if he has any drainage. He will follow-up as needed.  I discussed with him concerning keeping the ear dry as moisture contributes to the drainage and itching. He will follow-up as needed.   Radene Journey, MD

## 2021-02-20 NOTE — Patient Instructions (Signed)
Hi Lee,  It was great to speak with you again! Below is a summary of some of the topics we discussed.   Please reach out to me if you have any questions or need anything before our follow up!  Best, Maddie  Jeni Salles, PharmD, [facility]  Visit Information  Goals Addressed   None    Patient Care Plan: CCM Pharmacy Care Plan    Problem Identified: Problem: Hypertension, Hyperlipidemia, Atrial Fibrillation, Depression, Anxiety and Insomnia     Long-Range Goal: Patient-Specific Goal   Start Date: 02/17/2021  Expected End Date: 02/17/2022  This Visit's Progress: On track  Priority: High  Note:   Current Barriers:  . Unable to independently monitor therapeutic efficacy  Pharmacist Clinical Goal(s):  [name] Patient will achieve adherence to monitoring guidelines and medication adherence to achieve therapeutic efficacy through collaboration with PharmD and provider.   Interventions: . 1:1 collaboration with [name], MD regarding development and update of comprehensive plan of care as evidenced by provider attestation and co-signature . Inter-disciplinary care team collaboration (see longitudinal plan of care) . Comprehensive medication review performed; medication list updated in electronic medical record  Hypertension (BP goal <140/90) -Controlled -Current treatment:  Losartan 25 mg 1 tablet daily    Diltiazem 240 mg 1 capsule in the morning  Metoprolol succinate 25 mg 1 tablet daily - in AM -Medications previously tried: none  -Current home readings: 115/70 (checking twice a day) -Current dietary habits: did not discuss -Current exercise habits: did not discuss -Denies hypotensive/hypertensive symptoms -Educated on BP goals and benefits of medications for prevention of heart attack, stroke and kidney damage; Exercise goal of 150 minutes per week; Importance of home blood pressure monitoring; Proper  BP monitoring technique; -Counseled to monitor BP at home weekly, document, and provide log at future appointments -Counseled on diet and exercise extensively Recommended to continue current medication  Hyperlipidemia: (LDL goal < 100) -Controlled -Current treatment: . Rosuvastatin 20 mg 1 tablet daily -Medications previously tried: none  -Current dietary patterns: did not discuss -Current exercise habits: did not discuss -Educated on Cholesterol goals;  Importance of limiting foods high in cholesterol; Exercise goal of 150 minutes per week; -Counseled on diet and exercise extensively Recommended to continue current medication Recommended repeat lipid panel with cardiology.  Atrial Fibrillation (Goal: prevent stroke and major bleeding) -Controlled -CHADSVASC: 2 -Current treatment:  Rate/rhythm control: Diltiazem 240 mg 1 capsule in the morning; Metoprolol succinate 25 mg 1 tablet daily in morning; Flecainide 100 mg 1 tablet twice daily . Anticoagulation: Warfarin 5 mg, 7.5 mg on Mon, Wed and Fri and 5 mg on all other days -Medications previously tried: none -Home BP and HR readings: 70s  -Counseled on importance of adherence to anticoagulant exactly as prescribed; avoidance of NSAIDs due to increased bleeding risk with anticoagulants; -Counseled on diet and exercise extensively Recommended to continue current medication  Depression/Anxiety (Goal: minimize symptoms) -Controlled -Current treatment: . Sertraline 50 mg 1 tablet daily -Medications previously tried/failed: none -PHQ9: 0 -GAD7: n/a -Educated on Benefits of medication for symptom control -Recommended to continue current medication  Insomnia (Goal: improve quality and quantity of sleep) -Not ideally controlled -Current treatment  . Alprazolam 0.25 mg 1 tablet at bedtime as needed -Medications previously tried: none  -Counseled on long term risks of taking benzodiazepines such as increased risk for bone  fractures and memory loss - patient reports taking almost every night  BPH (Goal: minimize symptoms) -Controlled -Current  treatment  . No medications -Medications previously tried: Alfuzosin (flare up)  -Recommended to continue as is   Health Maintenance -Vaccine gaps: Shingrix -Current therapy:   Vitamin D 2000 units 1 capsule daily  Multivitamin 1 tablet daily  Potassium citrate 10 mEq 1 tablet 3 times daily (urology)  Turmeric 500 mg 1 capsule daily - bleeding risk -Educated on Cost vs benefit of each product must be carefully weighed by individual consumer -Patient is satisfied with current therapy and denies issues - Patient had stopped Turmeric due to bleed risk but is going to restart because he found benefit with it. Cautioned him to monitor for bleeding.  Patient Goals/Self-Care Activities . Patient will:  - check blood pressure weekly, document, and provide at future appointments target a minimum of 150 minutes of moderate intensity exercise weekly  Follow Up Plan: Telephone follow up appointment with care management team member scheduled for: 6 months       Patient verbalizes understanding of instructions provided today and agrees to view in [place].  Telephone follow up appointment with pharmacy team member scheduled for: 6 months  Viona Gilmore, [facility]

## 2021-02-22 ENCOUNTER — Other Ambulatory Visit: Payer: Self-pay

## 2021-02-22 ENCOUNTER — Ambulatory Visit (INDEPENDENT_AMBULATORY_CARE_PROVIDER_SITE_OTHER): Payer: Medicare Other

## 2021-02-22 DIAGNOSIS — I480 Paroxysmal atrial fibrillation: Secondary | ICD-10-CM | POA: Diagnosis not present

## 2021-02-22 DIAGNOSIS — Z5181 Encounter for therapeutic drug level monitoring: Secondary | ICD-10-CM

## 2021-02-22 LAB — POCT INR: INR: 2.2 (ref 2.0–3.0)

## 2021-02-22 NOTE — Patient Instructions (Signed)
Continue taking 7m daily except 7.57mevery Monday, Wednesday and Friday. Recheck INR in 6 weeks

## 2021-02-22 NOTE — Addendum Note (Signed)
Addended by: Rebecca Eaton on: 02/22/2021 09:48 AM   Modules accepted: Orders

## 2021-03-13 ENCOUNTER — Other Ambulatory Visit: Payer: Self-pay | Admitting: Family Medicine

## 2021-03-27 ENCOUNTER — Other Ambulatory Visit: Payer: Self-pay | Admitting: Cardiovascular Disease

## 2021-04-05 ENCOUNTER — Other Ambulatory Visit: Payer: Self-pay

## 2021-04-05 ENCOUNTER — Ambulatory Visit (INDEPENDENT_AMBULATORY_CARE_PROVIDER_SITE_OTHER): Payer: Medicare Other

## 2021-04-05 DIAGNOSIS — I480 Paroxysmal atrial fibrillation: Secondary | ICD-10-CM

## 2021-04-05 DIAGNOSIS — Z5181 Encounter for therapeutic drug level monitoring: Secondary | ICD-10-CM | POA: Diagnosis not present

## 2021-04-05 LAB — POCT INR: INR: 3.3 — AB (ref 2.0–3.0)

## 2021-04-05 NOTE — Patient Instructions (Signed)
Continue taking 45m daily except 7.556mevery Monday, Wednesday and Friday. Recheck INR in 6 weeks.  Eat greens tonight only.

## 2021-04-10 IMAGING — CT CT ABD-PELV W/ CM
2 of 5 series · 14 of 46 positions shown, 16 images · IV contrast (OMNIPAQUE 300)
Comparison: CT of the abdomen and pelvis 07/14/2015

CLINICAL DATA: Right lower quadrant pain. Appendicitis suspected.

EXAM:
CT ABDOMEN AND PELVIS WITH CONTRAST
TECHNIQUE: Multidetector CT imaging of the abdomen and pelvis was performed
using the standard protocol following bolus administration of
intravenous contrast.
CONTRAST:  100mL OMNIPAQUE IOHEXOL 300 MG/ML  SOLN

[Series 2: axial st · axial · 0.76mm/px · z∈[+1156,+1592]mm · 11 of 101 slices shown, 13 images]
[im 7/101  soft-tissue]
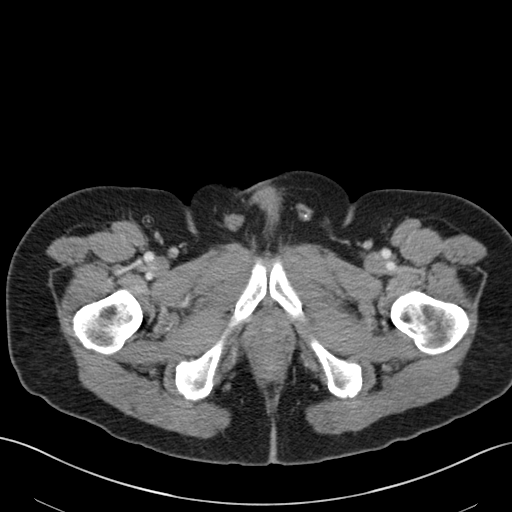
[im 7/101  bone]
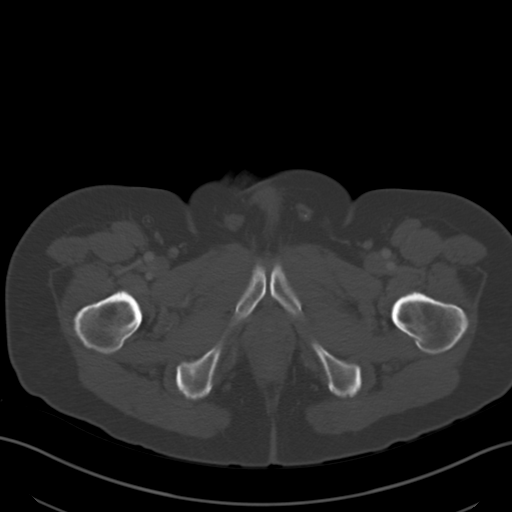
[im 19/101  soft-tissue]
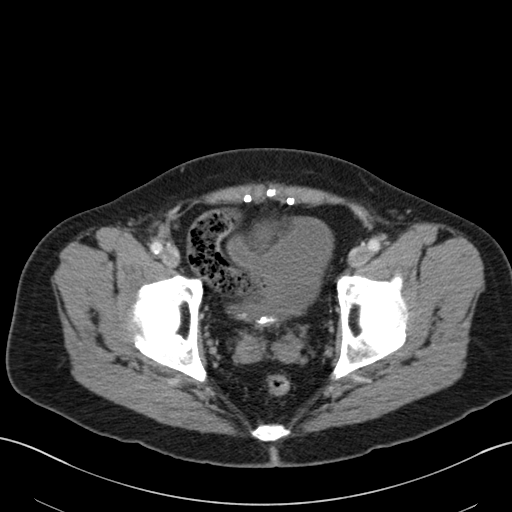
[im 26/101  soft-tissue]
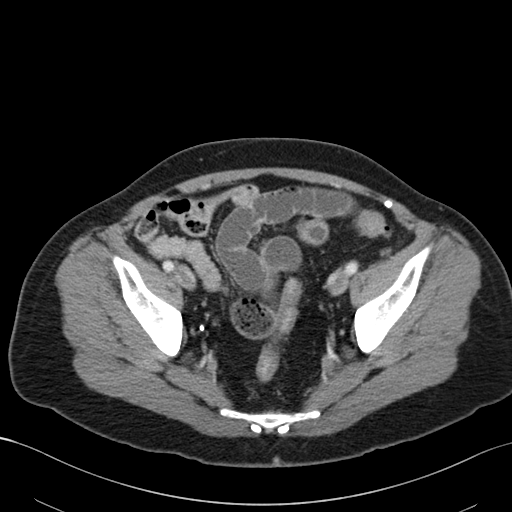
[im 32/101  soft-tissue]
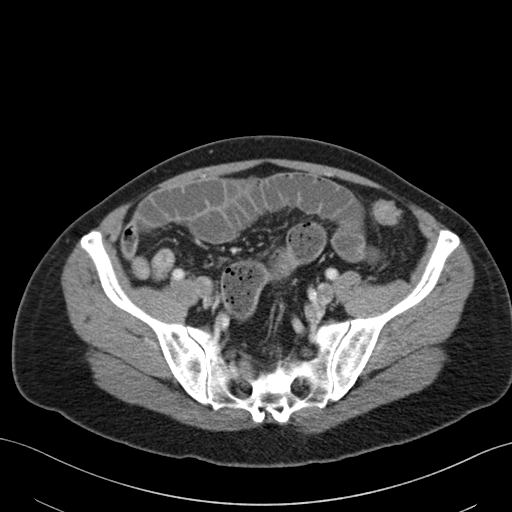
[im 44/101  soft-tissue]
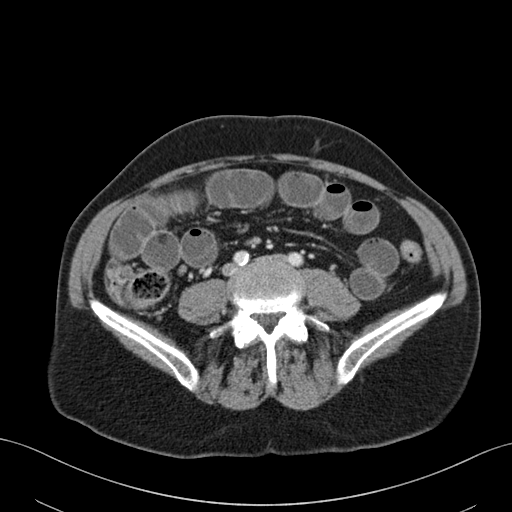
[im 51/101  soft-tissue]
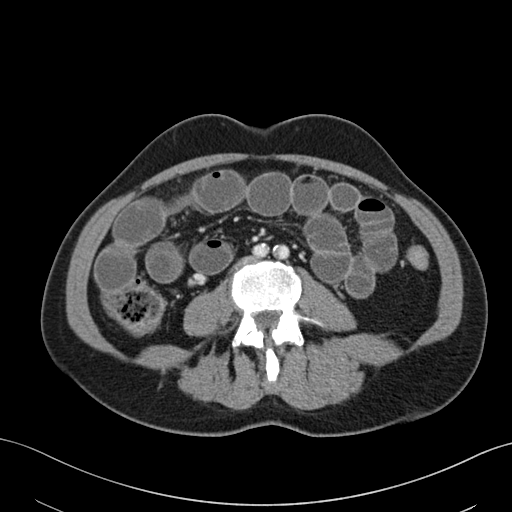
[im 57/101  soft-tissue]
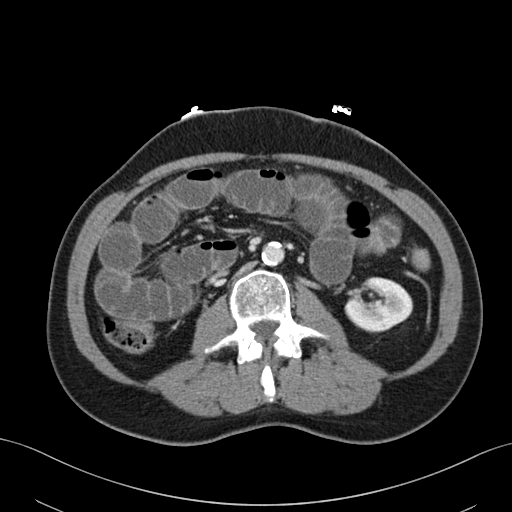
[im 69/101  soft-tissue]
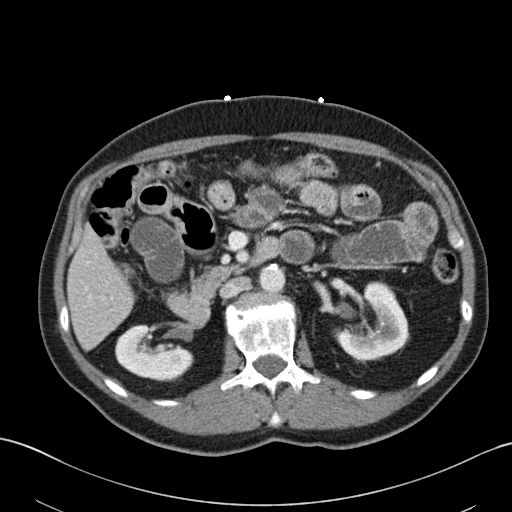
[im 76/101  soft-tissue]
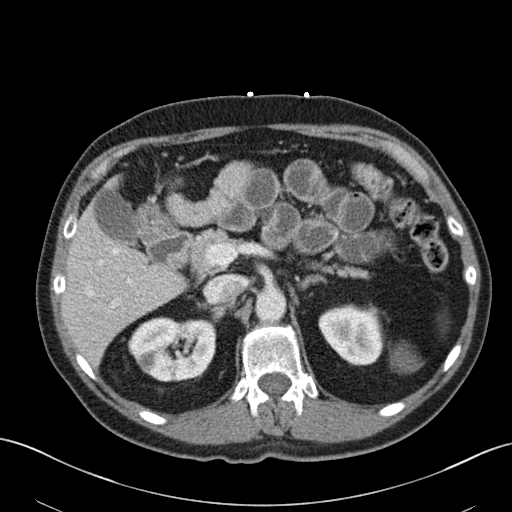
[im 76/101  bone]
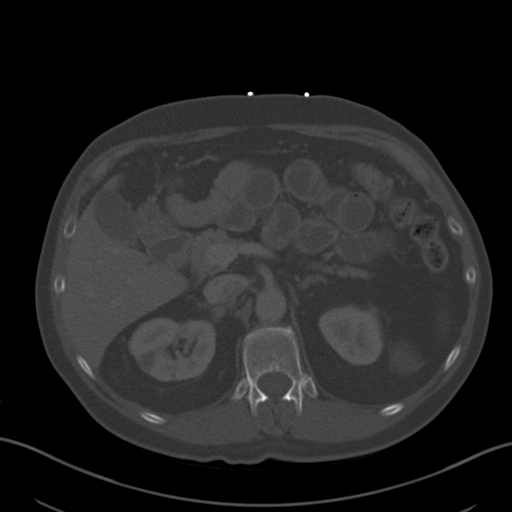
[im 82/101  soft-tissue]
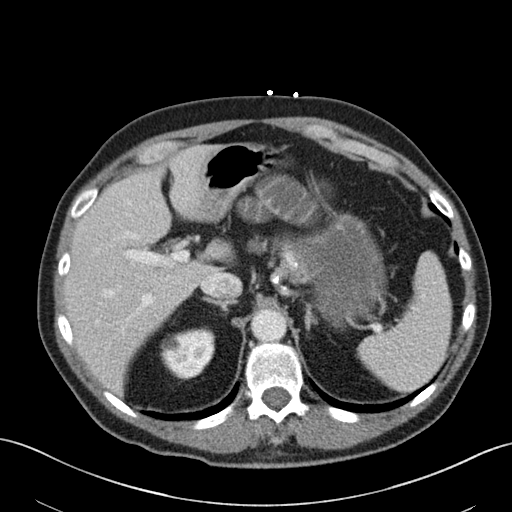
[im 94/101  soft-tissue]
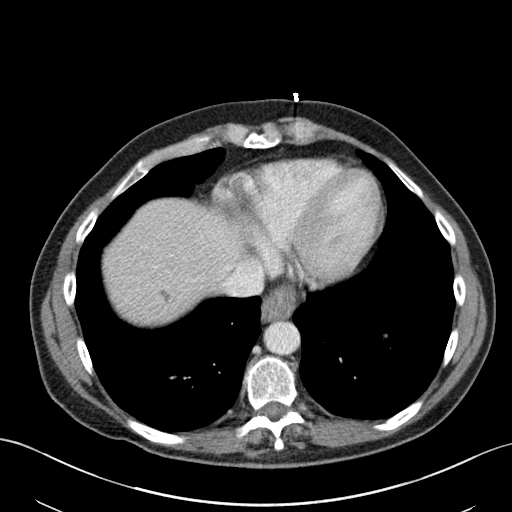

[Series 4: coronal st · coronal · 0.69mm/px · 3 of 125 slices shown]
[im 42/125  soft-tissue]
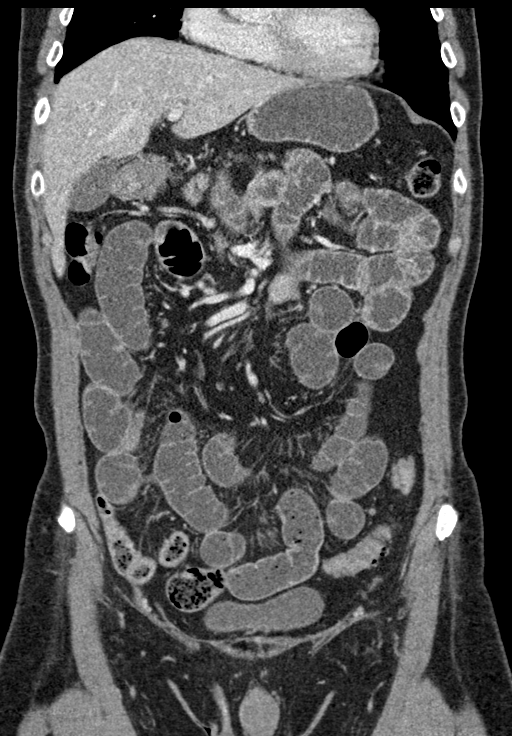
[im 56/125  soft-tissue]
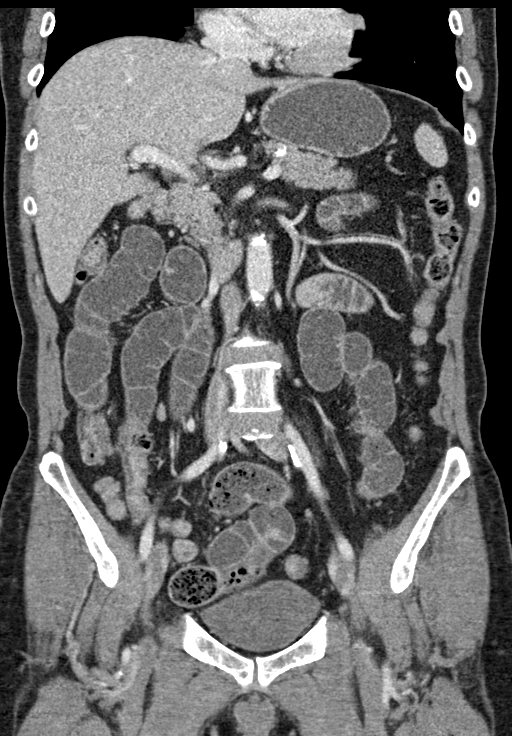
[im 69/125  soft-tissue]
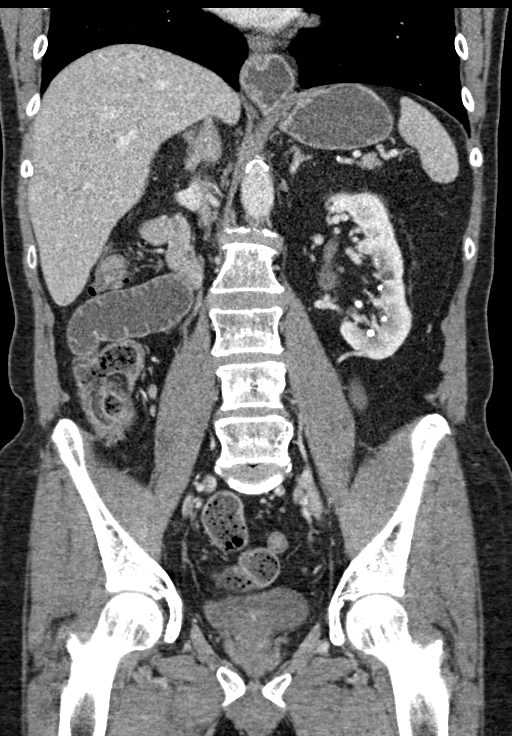

[14 of 46 positions shown; findings below may reference images not displayed]

FINDINGS: Lower chest: Calcified nodule in the right lower lobe is stable. The
lungs are otherwise clear. Heart size is normal. Artery
calcifications are evident. No significant pleural or pericardial
effusion is present.

Hepatobiliary: Small up attic cysts are stable. No new lesions are
present. Layering stones are present in the gallbladder.
Inflammatory changes are present to suggest cholecystitis. The
common bile duct is within normal.

Pancreas: Unremarkable. No pancreatic ductal dilatation or
surrounding inflammatory changes.

Spleen: Normal in size without focal abnormality.

Adrenals/Urinary Tract: Adrenal glands are normal bilaterally.
Bilateral renal cystic disease is again seen. Multiple
nonobstructing stones are present in the left kidney. Largest is at
the lower pole of the left kidney measuring up 6 mm. Ureters are
within normal limits. Layering stones or posterior bladder
calcifications are noted. The urinary bladder is otherwise within
normal limits.

Stomach/Bowel: Stomach and duodenum are within normal limits. Small
bowel is dilated to 2.7 cm. Transition point is present in the right
lower quadrant, adjacent to the previous mesh hernia repair. The
more distal small bowel is collapsed. Appendix is not discretely
visualized and may be surgically absent. No inflammatory changes are
present along the ileocolic ligament. The ascending and transverse
colon are within normal limits. The descending and sigmoid colon are
normal.

Vascular/Lymphatic: Atherosclerotic calcifications are present in
the aorta and branch vessels without aneurysm. Calcifications are
present at the origin of the celiac artery without significant
stenosis. 50% narrowing is noted at the origin of the SMA.

Reproductive: The prostate is enlarged, measuring 5.9 cm in
transverse diameter. No discrete lesion is present.

Other: No abdominal wall hernia or abnormality. No abdominopelvic
ascites.

Musculoskeletal: Vertebral body heights and alignment are
maintained. No focal lytic or blastic lesions are present. Bony
pelvis is within normal limits. The hips are located and within
normal limits.
IMPRESSION: 1. Small bowel obstruction with transition point in the right lower
quadrant adjacent to the previous mesh hernia repair.
2. No significant inflammatory changes are present along the
ileocolic ligament to suggest appendicitis. The appendix is not
discretely visualized.
3. Multiple nonobstructing stones in the left kidney.
4. Cholelithiasis without cholecystitis.
5. Stable hepatic and renal cysts.
6. Enlarged prostate gland without a discrete lesion.
7. Aortic Atherosclerosis ([id number]).

## 2021-04-16 ENCOUNTER — Other Ambulatory Visit: Payer: Self-pay | Admitting: Family Medicine

## 2021-04-16 ENCOUNTER — Other Ambulatory Visit: Payer: Self-pay | Admitting: Cardiovascular Disease

## 2021-05-08 ENCOUNTER — Ambulatory Visit (INDEPENDENT_AMBULATORY_CARE_PROVIDER_SITE_OTHER): Payer: Medicare Other

## 2021-05-08 DIAGNOSIS — Z0000 Encounter for general adult medical examination without abnormal findings: Secondary | ICD-10-CM | POA: Diagnosis not present

## 2021-05-08 NOTE — Patient Instructions (Signed)
[name] , Thank you for taking time to come for your Medicare Wellness Visit. I appreciate your ongoing commitment to your health goals. Please review the following plan we discussed and let me know if I can assist you in the future.   Screening recommendations/referrals: Colonoscopy: 11/07/2011  due 2023 Recommended yearly ophthalmology/optometry visit for glaucoma screening and checkup Recommended yearly dental visit for hygiene and checkup  Vaccinations: Influenza vaccine: due in the fall 2022  Pneumococcal vaccine: completed series  Tdap vaccine: 11/27/2011 Shingles vaccine: will consider     Advanced directives: will provide copies   Conditions/risks identified: none   Next appointment: 05/29/2021  0900am Dr. Elease Hashimoto   Preventive Care 65 Years and Older, Male Preventive care refers to lifestyle choices and visits with your health care provider that can promote health and wellness. What does preventive care include? A yearly physical exam. This is also called an annual well check. Dental exams once or twice a year. Routine eye exams. Ask your health care provider how often you should have your eyes checked. Personal lifestyle choices, including: Daily care of your teeth and gums. Regular physical activity. Eating a healthy diet. Avoiding tobacco and drug use. Limiting alcohol use. Practicing safe sex. Taking low doses of aspirin every day. Taking vitamin and mineral supplements as recommended by your health care provider. What happens during an annual well check? The services and screenings done by your health care provider during your annual well check will depend on your age, overall health, lifestyle risk factors, and family history of disease. Counseling  Your health care provider may ask you questions about your: Alcohol use. Tobacco use. Drug use. Emotional well-being. Home and relationship well-being. Sexual activity. Eating habits. History of  falls. Memory and ability to understand (cognition). Work and work Statistician. Screening  You may have the following tests or measurements: Height, weight, and BMI. Blood pressure. Lipid and cholesterol levels. These may be checked every 5 years, or more frequently if you are over 30 years old. Skin check. Lung cancer screening. You may have this screening every year starting at age 63 if you have a 30-pack-year history of smoking and currently smoke or have quit within the past 15 years. Fecal occult blood test (FOBT) of the stool. You may have this test every year starting at age 81. Flexible sigmoidoscopy or colonoscopy. You may have a sigmoidoscopy every 5 years or a colonoscopy every 10 years starting at age 79. Prostate cancer screening. Recommendations will vary depending on your family history and other risks. Hepatitis C blood test. Hepatitis B blood test. Sexually transmitted disease (STD) testing. Diabetes screening. This is done by checking your blood sugar (glucose) after you have not eaten for a while (fasting). You may have this done every 1-3 years. Abdominal aortic aneurysm (AAA) screening. You may need this if you are a current or former smoker. Osteoporosis. You may be screened starting at age 28 if you are at high risk. Talk with your health care provider about your test results, treatment options, and if necessary, the need for more tests. Vaccines  Your health care provider may recommend certain vaccines, such as: Influenza vaccine. This is recommended every year. Tetanus, diphtheria, and acellular pertussis (Tdap, Td) vaccine. You may need a Td booster every 10 years. Zoster vaccine. You may need this after age 70. Pneumococcal 13-valent conjugate (PCV13) vaccine. One dose is recommended after age 58. Pneumococcal polysaccharide (PPSV23) vaccine. One dose is recommended after age 85. Talk to your  health care provider about which screenings and vaccines you need and  how often you need them. This information is not intended to replace advice given to you by your health care provider. Make sure you discuss any questions you have with your health care provider. Document Released: 09/30/2015 Document Revised: 05/23/2016 Document Reviewed: 07/05/2015 Elsevier Interactive Patient Education  2017 [place] Prevention in the Home Falls can cause injuries. They can happen to people of all ages. There are many things you can do to make your home safe and to help prevent falls. What can I do on the outside of my home? Regularly fix the edges of walkways and driveways and fix any cracks. Remove anything that might make you trip as you walk through a door, such as a raised step or threshold. Trim any bushes or trees on the path to your home. Use bright outdoor lighting. Clear any walking paths of anything that might make someone trip, such as rocks or tools. Regularly check to see if handrails are loose or broken. Make sure that both sides of any steps have handrails. Any raised decks and porches should have guardrails on the edges. Have any leaves, snow, or ice cleared regularly. Use sand or salt on walking paths during winter. Clean up any spills in your garage right away. This includes oil or grease spills. What can I do in the bathroom? Use night lights. Install grab bars by the toilet and in the tub and shower. Do not use towel bars as grab bars. Use non-skid mats or decals in the tub or shower. If you need to sit down in the shower, use a plastic, non-slip stool. Keep the floor dry. Clean up any water that spills on the floor as soon as it happens. Remove soap buildup in the tub or shower regularly. Attach bath mats securely with double-sided non-slip rug tape. Do not have throw rugs and other things on the floor that can make you trip. What can I do in the bedroom? Use night lights. Make sure that you have a light by your bed that is easy to  reach. Do not use any sheets or blankets that are too big for your bed. They should not hang down onto the floor. Have a firm chair that has side arms. You can use this for support while you get dressed. Do not have throw rugs and other things on the floor that can make you trip. What can I do in the kitchen? Clean up any spills right away. Avoid walking on wet floors. Keep items that you use a lot in easy-to-reach places. If you need to reach something above you, use a strong step stool that has a grab bar. Keep electrical cords out of the way. Do not use floor polish or wax that makes floors slippery. If you must use wax, use non-skid floor wax. Do not have throw rugs and other things on the floor that can make you trip. What can I do with my stairs? Do not leave any items on the stairs. Make sure that there are handrails on both sides of the stairs and use them. Fix handrails that are broken or loose. Make sure that handrails are as long as the stairways. Check any carpeting to make sure that it is firmly attached to the stairs. Fix any carpet that is loose or worn. Avoid having throw rugs at the top or bottom of the stairs. If you do have throw rugs, attach them  to the floor with carpet tape. Make sure that you have a light switch at the top of the stairs and the bottom of the stairs. If you do not have them, ask someone to add them for you. What else can I do to help prevent falls? Wear shoes that: Do not have high heels. Have rubber bottoms. Are comfortable and fit you well. Are closed at the toe. Do not wear sandals. If you use a stepladder: Make sure that it is fully opened. Do not climb a closed stepladder. Make sure that both sides of the stepladder are locked into place. Ask someone to hold it for you, if possible. Clearly mark and make sure that you can see: Any grab bars or handrails. First and last steps. Where the edge of each step is. Use tools that help you move  around (mobility aids) if they are needed. These include: Canes. Walkers. Scooters. Crutches. Turn on the lights when you go into a dark area. Replace any light bulbs as soon as they burn out. Set up your furniture so you have a clear path. Avoid moving your furniture around. If any of your floors are uneven, fix them. If there are any pets around you, be aware of where they are. Review your medicines with your doctor. Some medicines can make you feel dizzy. This can increase your chance of falling. Ask your doctor what other things that you can do to help prevent falls. This information is not intended to replace advice given to you by your health care provider. Make sure you discuss any questions you have with your health care provider. Document Released: 06/30/2009 Document Revised: 02/09/2016 Document Reviewed: 10/08/2014 Elsevier Interactive Patient Education  2017 [employer].

## 2021-05-08 NOTE — Progress Notes (Signed)
Subjective:   Francis Campbell. is a 75 y.o. male who presents for an Initial Medicare Annual Wellness Visit.   I connected with Francis Campbell today by telephone and verified that I am speaking with the correct person using two identifiers. Location patient: home Location provider: work Persons participating in the virtual visit: patient, provider.   I discussed the limitations, risks, security and privacy concerns of performing an evaluation and management service by telephone and the availability of in person appointments. I also discussed with the patient that there may be a patient responsible charge related to this service. The patient expressed understanding and verbally consented to this telephonic visit.    Interactive audio and video telecommunications were attempted between this provider and patient, however failed, due to patient having technical difficulties OR patient did not have access to video capability.  We continued and completed visit with audio only.    Review of Systems    N/a       Objective:    There were no vitals filed for this visit. There is no height or weight on file to calculate BMI.  Advanced Directives 05/04/2020 11/20/2019 10/11/2016 08/18/2015 06/27/2015 06/23/2015 05/08/2013  Does Patient Have a Medical Advance Directive? Yes No No No Yes Yes Patient does not have advance directive  Type of Advance Directive [place];Living will - - - Living will - -  Does patient want to make changes to medical advance directive? No - Patient declined - - - No - Patient declined - -  Copy of [place] in Chart? No - copy requested - - - Yes Yes -  Would patient like information on creating a medical advance directive? - No - Patient declined - No - patient declined information - - -  Pre-existing out of facility DNR order (yellow form or pink MOST form) - - - - - - -    Current Medications (verified) Outpatient Encounter  Medications as of 05/08/2021  Medication Sig   ALPRAZolam (XANAX) 0.25 MG tablet TAKE 1 TABLET BY MOUTH AT BEDTIME AS NEEDED FOR ANXIETY OR SLEEP.   Cholecalciferol (VITAMIN D3) 1000 UNITS CAPS Take 2,000 Units by mouth every morning.   diltiazem (CARDIZEM CD) 240 MG 24 hr capsule TAKE 1 CAPSULE BY MOUTH EVERY MORNING   flecainide (TAMBOCOR) 100 MG tablet TAKE 1 TABLET TWICE DAILY   losartan (COZAAR) 25 MG tablet TAKE 1 TABLET EVERY DAY   metoprolol succinate (TOPROL-XL) 25 MG 24 hr tablet TAKE 1 TABLET EVERY DAY WITH OR IMMEDIATELY FOLLOWING A MEAL   Multiple Vitamin (MULTIVITAMIN) tablet Take 1 tablet by mouth daily.   neomycin-polymyxin-hydrocortisone (CORTISPORIN) OTIC solution Place 4 drops into the right ear 4 (four) times daily.   potassium citrate (UROCIT-K) 10 MEQ (1080 MG) SR tablet TAKE 1 TABLET THREE TIMES DAILY   rosuvastatin (CRESTOR) 20 MG tablet TAKE 1 TABLET EVERY DAY (NEW DOSE)   sertraline (ZOLOFT) 50 MG tablet TAKE 1 TABLET EVERY DAY   Turmeric 500 MG CAPS Take 500 mg by mouth daily.   warfarin (COUMADIN) 5 MG tablet TAKE 1 TO 1 AND 1/2 TABLETS DAILY AS DIRECTED BY COUMADIN CLINIC   [DISCONTINUED] digoxin (LANOXIN) 0.125 MG tablet Take 1 tablet (125 mcg total) by mouth daily.   [DISCONTINUED] losartan-hydrochlorothiazide (HYZAAR) 100-12.5 MG per tablet    [DISCONTINUED] pantoprazole (PROTONIX) 40 MG tablet Take 1 tablet (40 mg total) by mouth daily.   No facility-administered encounter medications on file as  of 05/08/2021.    Allergies (verified) Chocolate, Chocolate flavor, Dextroamphetamine, Dextromethorphan, Flomax [tamsulosin hcl], Floxin [ofloxacin], Guaifenesin, Lisinopril, Tavist allergy [clemastine fumarate], Clemastine, Robitussin cold+flu daytime  [dm-phenylephrine-acetaminophen], Tamsulosin, Amiodarone, and Robitussin cf [pseudoephedrine-dm-gg]   History: Past Medical History:  Diagnosis Date   Allergy    Aortic regurgitation    Arthritis    Asthma    only  as child   Atrial flutter ([place])    a. Dx 09/2012.   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   TIA (transient ischemic attack)    ~2005 - had visual symptoms, was told by [name] this may have been a TIA but it's unclear   Valvular heart disease    Mild AI, ASclerosis without AS by echo 10/2011   Past Surgical History:  Procedure Laterality Date   CARDIOVERSION N/A 05/08/2013   Procedure: CARDIOVERSION;  Surgeon: Darlin Coco, MD;  Location: [facility] ENDOSCOPY;  Service: Cardiovascular;  Laterality: N/A;   CATARACT EXTRACTION     Right 12/2019   CATARACT EXTRACTION     CYSTOSCOPY/RETROGRADE/URETEROSCOPY/STONE EXTRACTION WITH BASKET Right 02/12/2013   Procedure: CYSTOSCOPY/RETROGRADE/URETEROSCOPY/STONE EXTRACTION WITH BASKET, WITH  STENT PLACEMENT RIGHT URETERAL DILATION;  Surgeon: [name], MD;  Location: [facility] ORS;  Service: Urology;  Laterality: Right;   EYE SURGERY Bilateral 1950, 1960, 1964   left, right, left   HERNIA REPAIR  2005   KNEE SURGERY Bilateral 1990, 2000   LITHOTRIPSY     ROTATOR CUFF REPAIR Left 2000   TONSILLECTOMY     Family History  Problem Relation Age of Onset   Stroke Mother    Hypertension Mother    Kidney disease Mother        stones   Nephrolithiasis Brother    Arthritis Paternal Grandmother    Heart disease Paternal Grandmother    Leukemia Father    Heart disease Father    Nephrolithiasis Father    Kidney cancer Son    Appendicitis Son    Heart attack Neg Hx    Colon cancer Neg Hx    Esophageal cancer Neg Hx    Pancreatic cancer Neg Hx    Stomach cancer Neg Hx    Liver disease Neg Hx    Social History   Socioeconomic History   Marital status: Married    Spouse name: Not on file   Number of children: 2   Years of education: Not on file    Highest education level: Not on file  Occupational History   Occupation: TECHNICAL SUP    Employer: [employer]  Tobacco Use   Smoking status: Never   Smokeless tobacco: Never  Vaping Use   Vaping Use: Never used  Substance and Sexual Activity   Alcohol use: No   Drug use: No   Sexual activity: Yes    Birth control/protection: None  Other Topics Concern   Not on file  Social History Narrative   Non smoker  No ets          Social Determinants of Health   Financial Resource Strain: Low Risk    Difficulty of Paying Living Expenses: Not hard at all  Food Insecurity: No Food Insecurity   Worried About Estate manager/land agent of Food in the Last  Year: Never true   [place] in the Last Year: Never true  Transportation Needs: No Transportation Needs   Lack of Transportation (Medical): No   Lack of Transportation (Non-Medical): No  Physical Activity: Inactive   Days of Exercise per Week: 0 days   Minutes of Exercise per Session: 0 min  Stress: No Stress Concern Present   Feeling of Stress : Not at all  Social Connections: Socially Integrated   Frequency of Communication with Friends and Family: More than three times a week   Frequency of Social Gatherings with Friends and Family: More than three times a week   Attends Religious Services: More than 4 times per year   Active Member of [employer] or Organizations: Yes   Attends Music therapist: More than 4 times per year   Marital Status: Married    Tobacco Counseling Counseling given: Not Answered   Clinical Intake:                 Diabetic?no         Activities of Daily Living No flowsheet data found.  Patient Care Team: [name], MD as PCP - General (Family Medicine) Skeet Latch, MD as PCP - Cardiology (Cardiology) Johnathan Hausen, MD as Consulting Physician (General Surgery) Milus Banister, MD as Attending Physician (Gastroenterology) Irine Seal, MD as Attending Physician  (Urology) Viona Gilmore, [facility] as Pharmacist (Pharmacist)  Indicate any recent Medical Services you may have received from other than [facility] providers in the past year (date may be approximate).     Assessment:   This is a routine wellness examination for Francis Campbell.  Hearing/Vision screen No results found.  Dietary issues and exercise activities discussed:     Goals Addressed   None    Depression Screen PHQ 2/9 Scores 05/04/2020 10/11/2016 10/11/2016 02/21/2016 04/08/2015 07/30/2014  PHQ - 2 Score 0 0 0 0 0 0  PHQ- 9 Score 0 - - - - -    Fall Risk Fall Risk  05/04/2020 08/12/2019 08/01/2018 10/11/2016 10/11/2016  Falls in the past year? 0 0 0 No No  Comment - Emmi Telephone Survey: data to providers prior to load Emmi Telephone Survey: data to providers prior to load - -  Number falls in past yr: 0 - - - -  Injury with Fall? 0 - - - -  Risk for fall due to : Medication side effect - - - -  Follow up Falls evaluation completed;Falls prevention discussed - - - -    FALL RISK PREVENTION PERTAINING TO THE HOME:  Any stairs in or around the home? No  If so, are there any without handrails? No  Home free of loose throw rugs in walkways, pet beds, electrical cords, etc? Yes  Adequate lighting in your home to reduce risk of falls? Yes   ASSISTIVE DEVICES UTILIZED TO PREVENT FALLS:  Life alert? No  Use of a cane, walker or w/c? No  Grab bars in the bathroom? No  Shower chair or bench in shower? No  Elevated toilet seat or a handicapped toilet? Yes    Cognitive Function: Normal cognitive status assessed by direct observation by this Nurse Health Advisor. No abnormalities found.   MMSE - Mini Mental State Exam 10/11/2016  Not completed: (No Data)        Immunizations Immunization History  Administered Date(s) Administered   Fluad Quad(high Dose 65+) 05/28/2019, 06/13/2020   Influenza Split 07/30/2011, 07/06/2013   Influenza, High Dose  Seasonal PF 05/26/2015, 05/18/2016,  06/18/2017, 07/07/2018   Influenza,inj,Quad PF,6+ Mos 06/16/2014   PFIZER(Purple Top)SARS-COV-2 Vaccination 10/22/2019, 11/17/2019, 06/14/2020, 12/30/2020   Pneumococcal Conjugate-13 09/17/2010, 08/31/2013   Pneumococcal Polysaccharide-23 04/08/2015   Tdap 11/27/2011   Zoster, Live 09/28/2010    TDAP status: Up to date  Flu Vaccine status: Up to date  Pneumococcal vaccine status: Up to date  Covid-19 vaccine status: Completed vaccines  Qualifies for Shingles Vaccine? Yes   Zostavax completed No   Shingrix Completed?: No.    Education has been provided regarding the importance of this vaccine. Patient has been advised to call insurance company to determine out of pocket expense if they have not yet received this vaccine. Advised may also receive vaccine at local pharmacy or Health Dept. Verbalized acceptance and understanding.  Screening Tests Health Maintenance  Topic Date Due   Zoster Vaccines- Shingrix (1 of 2) Never done   INFLUENZA VACCINE  04/17/2021   COLONOSCOPY (Pts 45-46yr Insurance coverage will need to be confirmed)  11/06/2021   TETANUS/TDAP  11/26/2021   COVID-19 Vaccine  Completed   Hepatitis C Screening  Completed   PNA vac Low Risk Adult  Completed   HPV VACCINES  Aged Out    Health Maintenance  Health Maintenance Due  Topic Date Due   Zoster Vaccines- Shingrix (1 of 2) Never done   INFLUENZA VACCINE  04/17/2021    Colorectal cancer screening: Type of screening: Colonoscopy. Completed 11/07/2011. Repeat every 10 years  Lung Cancer Screening: (Low Dose CT Chest recommended if Age 74-80years, 30 pack-year currently smoking OR have quit w/in 15years.) does not qualify.   Lung Cancer Screening Referral: n/a  Additional Screening:  Hepatitis C Screening: does not qualify; Completed 10/11/2016  Vision Screening: Recommended annual ophthalmology exams for early detection of glaucoma and other disorders of the eye. Is the patient up to date with their  annual eye exam?  Yes  Who is the provider or what is the name of the office in which the patient attends annual eye exams? Dr. MSabra Heck If pt is not established with a provider, would they like to be referred to a provider to establish care? No .   Dental Screening: Recommended annual dental exams for proper oral hygiene  Community Resource Referral / Chronic Care Management: CRR required this visit?  No   CCM required this visit?  No      Plan:     I have personally reviewed and noted the following in the patient's chart:   Medical and social history Use of alcohol, tobacco or illicit drugs  Current medications and supplements including opioid prescriptions. Patient is not currently taking opioid prescriptions. Functional ability and status Nutritional status Physical activity Advanced directives List of other physicians Hospitalizations, surgeries, and ER visits in previous 12 months Vitals Screenings to include cognitive, depression, and falls Referrals and appointments  In addition, I have reviewed and discussed with patient certain preventive protocols, quality metrics, and best practice recommendations. A written personalized care plan for preventive services as well as general preventive health recommendations were provided to patient.     LRandel Pigg LPN   [id number]  Nurse Notes: none

## 2021-05-10 ENCOUNTER — Ambulatory Visit (INDEPENDENT_AMBULATORY_CARE_PROVIDER_SITE_OTHER): Payer: Medicare Other | Admitting: Cardiovascular Disease

## 2021-05-10 ENCOUNTER — Other Ambulatory Visit: Payer: Self-pay

## 2021-05-10 ENCOUNTER — Encounter (HOSPITAL_BASED_OUTPATIENT_CLINIC_OR_DEPARTMENT_OTHER): Payer: Self-pay | Admitting: Cardiovascular Disease

## 2021-05-10 VITALS — BP 110/62 | HR 51 | Ht 74.0 in | Wt 196.2 lb

## 2021-05-10 DIAGNOSIS — Z5181 Encounter for therapeutic drug level monitoring: Secondary | ICD-10-CM | POA: Diagnosis not present

## 2021-05-10 DIAGNOSIS — E7800 Pure hypercholesterolemia, unspecified: Secondary | ICD-10-CM

## 2021-05-10 DIAGNOSIS — E785 Hyperlipidemia, unspecified: Secondary | ICD-10-CM | POA: Diagnosis not present

## 2021-05-10 DIAGNOSIS — Z7901 Long term (current) use of anticoagulants: Secondary | ICD-10-CM | POA: Diagnosis not present

## 2021-05-10 DIAGNOSIS — I480 Paroxysmal atrial fibrillation: Secondary | ICD-10-CM

## 2021-05-10 NOTE — Patient Instructions (Signed)
Medication Instructions:  Your physician recommends that you continue on your current medications as directed. Please refer to the Current Medication list given to you today.   *If you need a refill on your cardiac medications before your next appointment, please call your pharmacy*  Lab Work: FASTING LP/CMET/MAGNESIUM WHEN YOU Omar INR   If you have labs (blood work) drawn today and your tests are completely normal, you will receive your results only by: MyChart Message (if you have MyChart) OR A paper copy in the mail If you have any lab test that is abnormal or we need to change your treatment, we will call you to review the results.  Testing/Procedures: NONE   Follow-Up: At [facility], you and your health needs are our priority.  As part of our continuing mission to provide you with exceptional heart care, we have created designated Provider Care Teams.  These Care Teams include your primary Cardiologist (physician) and Advanced Practice Providers (APPs -  Physician Assistants and Nurse Practitioners) who all work together to provide you with the care you need, when you need it.  We recommend signing up for the patient portal called "MyChart".  Sign up information is provided on this After Visit Summary.  MyChart is used to connect with patients for Virtual Visits (Telemedicine).  Patients are able to view lab/test results, encounter notes, upcoming appointments, etc.  Non-urgent messages can be sent to your provider as well.   To learn more about what you can do with MyChart, go to [email or web address].    Your next appointment:   12 month(s)  The format for your next appointment:   In Person  Provider:   Skeet Latch, MD  Other Instructions  MONITOR BLOOD PRESSURE AND HEART RATE FOR THE NEXT 1-2 WEEKS, CALL WITH READINGS

## 2021-05-10 NOTE — Progress Notes (Signed)
Cardiology Office Note   Date:  05/10/2021   ID:  Francis Campbell., DOB August 06, 1946, MRN [id number]  PCP:  Francis Post, MD  Cardiologist:   Francis Latch, MD  Cardiology APP: Francis Deforest, PA  No chief complaint on file.    History of Present Illness: Francis Campbell. is a 75 y.o. male with paroxysmal atrial fibrillation on coumadin, hypertension, hyperlipidemia, and Crohn's disease who presents for follow up.  Francis Campbell was previously a patient of Francis Campbell.   Francis Campbell was evaluated by Francis Campbell for catheter ablation in 2014 and elected to use flecainide as a "pill in the pocket."  He underwent DCCV 04/2013.  He had a negative Myoview 09/2011 and an echo 10/2011 that revealed LVEF 60% with mild AR.  He noted increasing episodes of atrial fibrillation and started on daily flecainide 04/2016.  He developed bradycardia, so metoprolol was reduced.    Francis Campbell was seen in the ED 08/2017 with intermittent chest tightness.  His symptoms were felt to be atypical and EKG was unremarkable.  Troponin and chest x-ray were within normal limits.  He followed up in clinic and was referred for an exercise Myoview 09/2017 that was negative for ischemia.  Since his last appointment Francis Campbell has been doing well.  He has been walking, biking, and doing well.  He also came out of retirement and has been Social research officer, government at [employer].  Overall he is feeling well and has no complaints.  He denies lightheadedness or dizziness.  He takes his blood pressure and heart rate about twice a month and thinks that it is around 140/80 and his heart rate is about 65.  He did have 2 episodes of atrial fibrillation.  He took an extra flecainide and within 2 to 3 hours he was back in sinus rhythm.  Past Medical History:  Diagnosis Date   Allergy    Aortic regurgitation    Arthritis    Asthma    only as child   Atrial flutter ([place])    a. Dx 09/2012.   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   TIA (transient ischemic attack)    ~2005 - had visual symptoms, was told by Francis Campbell this may have been a TIA but it's unclear   Valvular heart disease    Mild AI, ASclerosis without AS by echo 10/2011    Past Surgical History:  Procedure Laterality Date   CARDIOVERSION N/A 05/08/2013   Procedure: CARDIOVERSION;  Surgeon: Darlin Coco, MD;  Location: [facility] ENDOSCOPY;  Service: Cardiovascular;  Laterality: N/A;   CATARACT EXTRACTION     Right 12/2019   CATARACT EXTRACTION     CYSTOSCOPY/RETROGRADE/URETEROSCOPY/STONE EXTRACTION WITH BASKET Right 02/12/2013   Procedure: CYSTOSCOPY/RETROGRADE/URETEROSCOPY/STONE EXTRACTION WITH BASKET, WITH  STENT PLACEMENT RIGHT URETERAL DILATION;  Surgeon: [name], MD;  Location: [facility] ORS;  Service: Urology;  Laterality: Right;   EYE SURGERY Bilateral 1950, 1960, 1964   left, right, left   HERNIA REPAIR  2005   KNEE SURGERY Bilateral 1990, 2000   LITHOTRIPSY     ROTATOR CUFF REPAIR Left 2000   TONSILLECTOMY       Current Outpatient Medications  Medication Sig  Dispense Refill   ALPRAZolam (XANAX) 0.25 MG tablet TAKE 1 TABLET BY MOUTH AT BEDTIME AS NEEDED FOR ANXIETY OR SLEEP. 30 tablet 5   Cholecalciferol (VITAMIN D3) 1000 UNITS CAPS Take 2,000 Units by mouth every morning.     diltiazem (CARDIZEM CD) 240 MG 24 hr capsule TAKE 1 CAPSULE BY MOUTH EVERY MORNING 90 capsule 3   flecainide (TAMBOCOR) 100 MG tablet TAKE 1 TABLET TWICE DAILY 180 tablet 2   losartan (COZAAR) 25 MG tablet TAKE 1 TABLET EVERY DAY 90 tablet 3   metoprolol succinate (TOPROL-XL) 25 MG 24 hr tablet TAKE 1 TABLET EVERY DAY WITH OR IMMEDIATELY FOLLOWING A MEAL 90 tablet 3   Multiple Vitamin (MULTIVITAMIN) tablet Take 1 tablet by mouth daily.     neomycin-polymyxin-hydrocortisone  (CORTISPORIN) OTIC solution Place 4 drops into the right ear 4 (four) times daily. 10 mL 0   potassium citrate (UROCIT-K) 10 MEQ (1080 MG) SR tablet TAKE 1 TABLET THREE TIMES DAILY 270 tablet 3   rosuvastatin (CRESTOR) 20 MG tablet TAKE 1 TABLET EVERY DAY (NEW DOSE) 90 tablet 3   sertraline (ZOLOFT) 50 MG tablet TAKE 1 TABLET EVERY DAY 90 tablet 1   warfarin (COUMADIN) 5 MG tablet TAKE 1 TO 1 AND 1/2 TABLETS DAILY AS DIRECTED BY COUMADIN CLINIC 135 tablet 1   No current facility-administered medications for this visit.    Allergies:   Chocolate, Chocolate flavor, Dextroamphetamine, Dextromethorphan, Flomax [tamsulosin hcl], Floxin [ofloxacin], Guaifenesin, Lisinopril, Tavist allergy [clemastine fumarate], Clemastine, Robitussin cold+flu daytime  [dm-phenylephrine-acetaminophen], Tamsulosin, Amiodarone, and Robitussin cf [pseudoephedrine-dm-gg]    Social History:  The patient  reports that he has never smoked. He has never used smokeless tobacco. He reports that he does not drink alcohol and does not use drugs.   Family History:  The patient's family history includes Appendicitis in his son; Arthritis in his paternal grandmother; Heart disease in his father and paternal grandmother; Hypertension in his mother; Kidney cancer in his son; Kidney disease in his mother; Leukemia in his father; Nephrolithiasis in his brother and father; Stroke in his mother.    ROS:  Please see the history of present illness.   Otherwise, review of systems are positive for none.   All other systems are reviewed and negative.    PHYSICAL EXAM: VS:  BP 110/62   Pulse (!) 51   Ht 6' 2"  (1.88 m)   Wt 196 lb 3.2 oz (89 kg)   BMI 25.19 kg/m  , BMI Body mass index is 25.19 kg/m. GENERAL:  Well appearing HEENT: Pupils equal round and reactive, fundi not visualized, oral mucosa unremarkable NECK:  No jugular venous distention, waveform within normal limits, carotid upstroke brisk and symmetric, no bruits LUNGS:  Clear  to auscultation bilaterally HEART:  RRR.  PMI not displaced or sustained,S1 and S2 within normal limits, no S3, no S4, no clicks, no rubs, no murmurs ABD:  Flat, positive bowel sounds normal in frequency in pitch, no bruits, no rebound, no guarding, no midline pulsatile mass, no hepatomegaly, no splenomegaly EXT:  2 plus pulses throughout, no edema, no cyanosis no clubbing SKIN:  No rashes no nodules NEURO:  Cranial nerves II through XII grossly intact, motor grossly intact throughout PSYCH:  Cognitively intact, oriented to person place and time  EKG:  EKG is ordered today. The ekg ordered 06/26/16 demonstrates sinus bradycardia rate 52.  06/04/17: Sinus bradycardia.  Rate 48 bpm. 08/27/18: Sinus rhythm.  Rate 62 bpm.   07/14/19: Sinus rhythm.  Rate 60 bpm.  05/10/2020: Sinus rhythm.  Rate 60 bpm.  Incomplete right bundle branch block. 05/10/21: Sinus bradycardia.  Rate 51 bpm.  QTc 444 ms  Echo 01/03/18: Study Conclusions   - Left ventricle: The cavity size was normal. Wall thickness was   increased in a pattern of mild LVH. Systolic function was normal.   The estimated ejection fraction was in the range of 60% to 65%.   Wall motion was normal; there were no regional wall motion   abnormalities. Left ventricular diastolic function parameters   were normal. - Aortic valve: Dilated aortic sinus. There was mild regurgitation. - Atrial septum: No defect or patent foramen ovale was identified. - Pulmonary arteries: PA peak pressure: 31 mm Hg (S).  Exercise Myoview 09/25/17:   The patient walked for a total of 10 minutes and 15 seconds under standard Bruce protocol treadmill test. The peak heart rate is 142 which is 96% predicted maximal heart rate. There were no ST or T wave changes to suggest ischemia. Nuclear stress EF: 61%. The left ventricular ejection fraction is normal (55-65%). This is a low risk study. The study is normal.  Recent Labs: No results found for requested labs within  last 8760 hours.    Lipid Panel    Component Value Date/Time   CHOL 165 11/12/2019 0940   TRIG 171 (H) 11/12/2019 0940   HDL 43 11/12/2019 0940   CHOLHDL 3.8 11/12/2019 0940   CHOLHDL 5 10/07/2017 1510   VLDL 58.4 (H) 10/07/2017 1510   LDLCALC 92 11/12/2019 0940   LDLDIRECT 96.0 10/07/2017 1510      Wt Readings from Last 3 Encounters:  05/10/21 196 lb 3.2 oz (89 kg)  01/17/21 196 lb 14.4 oz (89.3 kg)  12/08/20 196 lb (88.9 kg)     ASSESSMENT AND PLAN:  # Paroxysmal atrial fibrillation:  # Bradycardia: Mr. Catalfamo remains in sinus rhythm on flecainide.  He had a couple brief episodes of atrial fibrillation that responded well to extra flecainide.  Continue warfarin.  He understands that he cannot take turmeric when on warfarin.  Heart rate is low today but he notes that it has been in the 60s at home and he denies any lightheadedness or dizziness.  He is no longer on digoxin.  Continue diltiazem and metoprolol.  Check a CMP and magnesium.  This patients CHA2DS2-VASc Score and unadjusted Ischemic Stroke Rate (% per year) is equal to 2.2 % stroke rate/year from a score of 2  Above score calculated as 1 point each if present [CHF, HTN, DM, Vascular=MI/PAD/Aortic Plaque, Age if 65-74, or Male] Above score calculated as 2 points each if present [Age > 75, or Stroke/TIA/TE]  # Atypical chest pain: Resolved. Exercise Myoview was negative for ischemia in 2019.    # Hypertension: Blood pressure well-controlled on diltiazem, metoprolol, and losartan.  It was a little low today.  He is going to track his pressures at home because he thinks it has been running higher.  His goal is less than 130/80.  # Hyperlipidemia:  Continue rosuvastatin.  Triglycerides are mildly elevated.  He will keep working on exercise and diet.  He will come back for fasting lipids and a CMP.  # Aortic valve sclerosis: Unchanged on echo 12/2017.  No evidence of heart failure on exam.   Current medicines are  reviewed at length with the patient today.  The patient does not have concerns regarding medicines.  The following changes have been made:  none  Labs/ tests ordered today include:   Orders Placed This Encounter  Procedures   Lipid panel   Magnesium   Comprehensive metabolic panel   EKG 75-QGBE      Disposition:   FU with Shea Kapur C. Oval Linsey, MD, [facility] in 1 year     Signed, Quatavious Rossa C. Oval Linsey, MD, [facility]  05/10/2021 8:45 AM    [place]

## 2021-05-15 DIAGNOSIS — M79645 Pain in left finger(s): Secondary | ICD-10-CM | POA: Diagnosis not present

## 2021-05-15 DIAGNOSIS — M25512 Pain in left shoulder: Secondary | ICD-10-CM | POA: Diagnosis not present

## 2021-05-15 DIAGNOSIS — M7541 Impingement syndrome of right shoulder: Secondary | ICD-10-CM | POA: Diagnosis not present

## 2021-05-15 DIAGNOSIS — M25519 Pain in unspecified shoulder: Secondary | ICD-10-CM | POA: Diagnosis not present

## 2021-05-15 DIAGNOSIS — R2232 Localized swelling, mass and lump, left upper limb: Secondary | ICD-10-CM | POA: Diagnosis not present

## 2021-05-17 ENCOUNTER — Telehealth: Payer: Self-pay

## 2021-05-17 ENCOUNTER — Other Ambulatory Visit: Payer: Self-pay

## 2021-05-17 ENCOUNTER — Ambulatory Visit (INDEPENDENT_AMBULATORY_CARE_PROVIDER_SITE_OTHER): Payer: Medicare Other

## 2021-05-17 ENCOUNTER — Other Ambulatory Visit: Payer: Self-pay | Admitting: Family Medicine

## 2021-05-17 DIAGNOSIS — I480 Paroxysmal atrial fibrillation: Secondary | ICD-10-CM | POA: Diagnosis not present

## 2021-05-17 DIAGNOSIS — Z5181 Encounter for therapeutic drug level monitoring: Secondary | ICD-10-CM

## 2021-05-17 DIAGNOSIS — E785 Hyperlipidemia, unspecified: Secondary | ICD-10-CM | POA: Diagnosis not present

## 2021-05-17 LAB — LIPID PANEL
Chol/HDL Ratio: 3 ratio (ref 0.0–5.0)
Cholesterol, Total: 137 mg/dL (ref 100–199)
HDL: 45 mg/dL (ref >39)
LDL Chol Calc (NIH): 76 mg/dL (ref 0–99)
Triglycerides: 85 mg/dL (ref 0–149)
VLDL Cholesterol Cal: 16 mg/dL (ref 5–40)

## 2021-05-17 LAB — COMPREHENSIVE METABOLIC PANEL
ALT: 21 IU/L (ref 0–44)
AST: 27 IU/L (ref 0–40)
Albumin/Globulin Ratio: 2 (ref 1.2–2.2)
Albumin: 4.4 g/dL (ref 3.7–4.7)
Alkaline Phosphatase: 82 IU/L (ref 44–121)
BUN/Creatinine Ratio: 25 — ABNORMAL HIGH (ref 10–24)
BUN: 27 mg/dL (ref 8–27)
Bilirubin Total: 0.6 mg/dL (ref 0.0–1.2)
CO2: 22 mmol/L (ref 20–29)
Calcium: 9.1 mg/dL (ref 8.6–10.2)
Chloride: 103 mmol/L (ref 96–106)
Creatinine, Ser: 1.07 mg/dL (ref 0.76–1.27)
Globulin, Total: 2.2 g/dL (ref 1.5–4.5)
Glucose: 107 mg/dL — ABNORMAL HIGH (ref 65–99)
Potassium: 4.8 mmol/L (ref 3.5–5.2)
Sodium: 141 mmol/L (ref 134–144)
Total Protein: 6.6 g/dL (ref 6.0–8.5)
eGFR: 73 mL/min/{1.73_m2} (ref >59)

## 2021-05-17 LAB — POCT INR: INR: 3 (ref 2.0–3.0)

## 2021-05-17 LAB — MAGNESIUM: Magnesium: 2.3 mg/dL (ref 1.6–2.3)

## 2021-05-17 NOTE — Telephone Encounter (Signed)
   [facility] Pre-operative Risk Assessment    Patient Name: Francis Campbell.  DOB: [birth year] MRN: [id number]  HEARTCARE STAFF:  - IMPORTANT!!!!!! Under Visit Info/Reason for Call, type in Other and utilize the format Clearance MM/DD/YY or Clearance TBD. Do not use dashes or single digits. - Please review there is not already an duplicate clearance open for this procedure. - If request is for dental extraction, please clarify the # of teeth to be extracted. - If the patient is currently at the dentist's office, call Pre-Op Callback Staff (MA/nurse) to input urgent request.  - If the patient is not currently in the dentist office, please route to the Pre-Op pool.  Request for surgical clearance:  What type of surgery is being performed? Excision Left Index Finger Cystic Mass Ulnar   When is this surgery scheduled? TBD  What type of clearance is required (medical clearance vs. Pharmacy clearance to hold med vs. Both)? Pharmacy   Are there any medications that need to be held prior to surgery and how long? Coumadin 5 days prior to procedure  Practice name and name of physician performing surgery? [facility] Dr. Roseanne Kaufman   What is the office phone number? [phone]   7.   What is the office fax number? [phone] Attn: Derl Barrow  8.   Anesthesia type (None, local, MAC, general) ? Unknown    Francis Campbell 05/17/2021, 3:38 PM  _________________________________________________________________   (provider comments below)

## 2021-05-17 NOTE — Patient Instructions (Signed)
Continue taking 28m daily except 7.565mevery Monday, Wednesday and Friday. Recheck INR in 6 weeks.

## 2021-05-18 ENCOUNTER — Encounter (HOSPITAL_BASED_OUTPATIENT_CLINIC_OR_DEPARTMENT_OTHER): Payer: Self-pay

## 2021-05-19 NOTE — Telephone Encounter (Signed)
Patient with diagnosis of warfarin on afib for anticoagulation.    Procedure: Excision Left Index Finger Cystic Mass Ulnar  Date of procedure: TBD  CHA2DS2-VASc Score = 3  This indicates a 3.2% annual risk of stroke. The patient's score is based upon: CHF History: No HTN History: Yes Diabetes History: No Stroke History: No Vascular Disease History: Yes (aortic atherosclerosis noted on 11/2019 abdominal CT) Age Score: 1 Gender Score: 0   CrCl 74m/min Platelet count 174K  Request is to hold warfarin for 5 days. PMH states "~2005 - had visual symptoms, was told by Dr. BMare Ferrarithis may have been a TIA but it's unclear." If TIA is counted in pt's risk, his CHADS2VASc score would be 5 and he would require bridging with Lovenox. If it's not counted, his score would be 3 and he would not require bridging with Lovenox. Will defer to Dr ROval Linseyfor input.

## 2021-05-21 ENCOUNTER — Other Ambulatory Visit: Payer: Self-pay | Admitting: Cardiovascular Disease

## 2021-05-23 NOTE — Telephone Encounter (Signed)
   Name: Francis Campbell.  DOB: Jan 07, 1946  MRN: [id number]   Primary Cardiologist: Skeet Latch, MD  Chart reviewed as part of pre-operative protocol coverage.   We have been asked for guidance to hold [place]. Per Dr. Oval Linsey: OK to hold warfarin without bridging.   Resume after surgery.  I will route this recommendation to the requesting party via Epic fax function and remove from pre-op pool. Please call with questions.  [place], PA 05/23/2021, 4:10 PM

## 2021-05-29 ENCOUNTER — Ambulatory Visit (INDEPENDENT_AMBULATORY_CARE_PROVIDER_SITE_OTHER): Payer: Medicare Other | Admitting: Family Medicine

## 2021-05-29 ENCOUNTER — Other Ambulatory Visit: Payer: Self-pay

## 2021-05-29 VITALS — BP 110/60 | HR 55 | Temp 97.6°F | Ht 74.0 in | Wt 196.0 lb

## 2021-05-29 DIAGNOSIS — F5104 Psychophysiologic insomnia: Secondary | ICD-10-CM | POA: Diagnosis not present

## 2021-05-29 DIAGNOSIS — Z23 Encounter for immunization: Secondary | ICD-10-CM

## 2021-05-29 DIAGNOSIS — F339 Major depressive disorder, recurrent, unspecified: Secondary | ICD-10-CM

## 2021-05-29 NOTE — Patient Instructions (Addendum)
Consider Shingrix vaccine and check at pharmacy if interested.

## 2021-05-29 NOTE — Progress Notes (Signed)
Established Patient Office Visit  Subjective:  Patient ID: Francis Sedgwick., male    DOB: 05/02/1946  Age: 75 y.o. MRN: [id number]  CC:  Chief Complaint  Patient presents with   Annual Exam    HPI Francis Campbell. was initially scheduled for a "complete physical ".  However, he has Medicare primary and already had Medicare wellness visit earlier this year.  We addressed his chronic medical issues as below  History of chronic insomnia.  For many years he is taking very low-dose alprazolam 0.25 mg at night.  He has not gotten relief with over-the-counter medication such as melatonin.  Should avoid Benadryl and other anticholinergics.  Has not had any issues with balance and no history of issues with low-dose alprazolam.  When he leaves this fall he has tremendous difficulties with sleep.  History of recurrent depression.  He is on sertraline 50 mg daily has been on this for several years.  He tried tapering off couple times in the past but had recurrent depression issues.  He feels like depression symptoms are currently very stable.  No suicidal ideation.  He retired 7 years ago and has gone back to work part-time [employer] recently and is enjoying that.  He is cycling outdoors several times per week and that is his major form of exercise.  Health maintenance reviewed.  He does need flu vaccine.  He had previous Zostavax but no history of Shingrix.  He is considering getting it this at the pharmacy.  Past Medical History:  Diagnosis Date   Allergy    Aortic regurgitation    Arthritis    Asthma    only as child   Atrial flutter ([place])    a. Dx 09/2012.   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   TIA (transient ischemic attack)    ~2005 - had visual symptoms, was told  by [name] this may have been a TIA but it's unclear   Valvular heart disease    Mild AI, ASclerosis without AS by echo 10/2011    Past Surgical History:  Procedure Laterality Date   CARDIOVERSION N/A 05/08/2013   Procedure: CARDIOVERSION;  Surgeon: Darlin Coco, MD;  Location: [facility] ENDOSCOPY;  Service: Cardiovascular;  Laterality: N/A;   CATARACT EXTRACTION     Right 12/2019   CATARACT EXTRACTION     CYSTOSCOPY/RETROGRADE/URETEROSCOPY/STONE EXTRACTION WITH BASKET Right 02/12/2013   Procedure: CYSTOSCOPY/RETROGRADE/URETEROSCOPY/STONE EXTRACTION WITH BASKET, WITH  STENT PLACEMENT RIGHT URETERAL DILATION;  Surgeon: [name], MD;  Location: [facility] ORS;  Service: Urology;  Laterality: Right;   EYE SURGERY Bilateral 1950, 1960, 1964   left, right, left   HERNIA REPAIR  2005   KNEE SURGERY Bilateral 1990, 2000   LITHOTRIPSY     ROTATOR CUFF REPAIR Left 2000   TONSILLECTOMY      Family History  Problem Relation Age of Onset   Stroke Mother    Hypertension Mother    Kidney disease Mother        stones   Nephrolithiasis Brother    Arthritis Paternal Grandmother    Heart disease Paternal Grandmother    Leukemia Father    Heart disease Father    Nephrolithiasis Father  Kidney cancer Son    Appendicitis Son    Heart attack Neg Hx    Colon cancer Neg Hx    Esophageal cancer Neg Hx    Pancreatic cancer Neg Hx    Stomach cancer Neg Hx    Liver disease Neg Hx     Social History   Socioeconomic History   Marital status: Married    Spouse name: Not on file   Number of children: 2   Years of education: Not on file   Highest education level: Not on file  Occupational History   Occupation: TECHNICAL SUP    Employer: [employer]  Tobacco Use   Smoking status: Never   Smokeless tobacco: Never  Vaping Use   Vaping Use: Never used  Substance and Sexual Activity   Alcohol use: No   Drug use: No   Sexual activity: Yes    Birth control/protection: None  Other Topics Concern    Not on file  Social History Narrative   Non smoker  No ets          Social Determinants of Radio broadcast assistant Strain: Low Risk    Difficulty of Paying Living Expenses: Not hard at all  Food Insecurity: No Food Insecurity   Worried About Charity fundraiser in the Last Year: Never true   [place] in the Last Year: Never true  Transportation Needs: No Transportation Needs   Lack of Transportation (Medical): No   Lack of Transportation (Non-Medical): No  Physical Activity: Insufficiently Active   Days of Exercise per Week: 2 days   Minutes of Exercise per Session: 20 min  Stress: No Stress Concern Present   Feeling of Stress : Not at all  Social Connections: Socially Integrated   Frequency of Communication with Friends and Family: Three times a week   Frequency of Social Gatherings with Friends and Family: Three times a week   Attends Religious Services: More than 4 times per year   Active Member of Clubs or Organizations: Yes   Attends Music therapist: More than 4 times per year   Marital Status: Married  Human resources officer Violence: Not At Risk   Fear of Current or Ex-Partner: No   Emotionally Abused: No   Physically Abused: No   Sexually Abused: No    Outpatient Medications Prior to Visit  Medication Sig Dispense Refill   ALPRAZolam (XANAX) 0.25 MG tablet TAKE 1 TABLET BY MOUTH AT BEDTIME AS NEEDED FOR ANXIETY OR SLEEP. 30 tablet 5   Cholecalciferol (VITAMIN D3) 1000 UNITS CAPS Take 2,000 Units by mouth every morning.     diltiazem (CARDIZEM CD) 240 MG 24 hr capsule TAKE 1 CAPSULE BY MOUTH EVERY MORNING 90 capsule 3   flecainide (TAMBOCOR) 100 MG tablet TAKE 1 TABLET TWICE DAILY 180 tablet 2   losartan (COZAAR) 25 MG tablet TAKE 1 TABLET EVERY DAY 90 tablet 3   metoprolol succinate (TOPROL-XL) 25 MG 24 hr tablet TAKE 1 TABLET EVERY DAY WITH OR IMMEDIATELY FOLLOWING A MEAL 90 tablet 3   Multiple Vitamin (MULTIVITAMIN) tablet Take 1 tablet by mouth  daily.     neomycin-polymyxin-hydrocortisone (CORTISPORIN) OTIC solution Place 4 drops into the right ear 4 (four) times daily. 10 mL 0   potassium citrate (UROCIT-K) 10 MEQ (1080 MG) SR tablet TAKE 1 TABLET THREE TIMES DAILY 270 tablet 3   rosuvastatin (CRESTOR) 20 MG tablet TAKE 1 TABLET EVERY DAY (NEW DOSE) 90 tablet 3  sertraline (ZOLOFT) 50 MG tablet TAKE 1 TABLET EVERY DAY 90 tablet 1   warfarin (COUMADIN) 5 MG tablet TAKE 1 TO 1 AND 1/2 TABLETS DAILY AS DIRECTED BY COUMADIN CLINIC 135 tablet 1   No facility-administered medications prior to visit.    Allergies  Allergen Reactions   Chocolate Palpitations   Chocolate Flavor Shortness Of Breath    Other reaction(s): Respiratory Distress   Dextroamphetamine Shortness Of Breath    Other reaction(s): Respiratory Distress   Dextromethorphan Palpitations   Flomax [Tamsulosin Hcl] Other (See Comments)    ? Causes A Fib   Floxin [Ofloxacin] Other (See Comments)    hallucinations   Guaifenesin Shortness Of Breath   Lisinopril Cough   Tavist Allergy [Clemastine Fumarate] Other (See Comments)    A fib worse   Clemastine     Other reaction(s): Irregular Heart Rate   Robitussin Cold+Flu Daytime  [Dm-Phenylephrine-Acetaminophen]     Other reaction(s): Respiratory Distress   Tamsulosin Other (See Comments)    Other reaction(s): Irregular Heart Rate   Amiodarone Other (See Comments)    Abn  thyroid   Robitussin Cf [Pseudoephedrine-Dm-Gg] Palpitations    ROS Review of Systems  Constitutional:  Negative for fatigue.  Eyes:  Negative for visual disturbance.  Respiratory:  Negative for cough, chest tightness and shortness of breath.   Cardiovascular:  Negative for chest pain, palpitations and leg swelling.  Endocrine: Negative for polydipsia and polyuria.  Neurological:  Negative for dizziness, syncope, weakness, light-headedness and headaches.  Psychiatric/Behavioral:  Negative for confusion and suicidal ideas.      Objective:     Physical Exam Vitals reviewed.  Constitutional:      Appearance: Normal appearance.  Cardiovascular:     Rate and Rhythm: Normal rate and regular rhythm.  Pulmonary:     Effort: Pulmonary effort is normal.     Breath sounds: Normal breath sounds.  Neurological:     General: No focal deficit present.     Mental Status: He is alert.     Cranial Nerves: No cranial nerve deficit.  Psychiatric:        Mood and Affect: Mood normal.        Thought Content: Thought content normal.    BP 110/60 (BP Location: Left Arm, Patient Position: Sitting, Cuff Size: Normal)   Pulse (!) 55   Temp 97.6 F (36.4 C) (Oral)   Ht _0  (1.88 m)   Wt 196 lb (88.9 kg)   SpO2 98%   BMI 25.16 kg/m  Wt Readings from Last 3 Encounters:  05/29/21 196 lb (88.9 kg)  05/10/21 196 lb 3.2 oz (89 kg)  01/17/21 196 lb 14.4 oz (89.3 kg)     Health Maintenance Due  Topic Date Due   Zoster Vaccines- Shingrix (1 of 2) Never done   INFLUENZA VACCINE  04/17/2021    There are no preventive care reminders to display for this patient.  Lab Results  Component Value Date   TSH 4.07 08/06/2013   Lab Results  Component Value Date   WBC 7.5 11/21/2019   HGB 14.4 11/21/2019   HCT 45.6 11/21/2019   MCV 90.5 11/21/2019   PLT 174 11/21/2019   Lab Results  Component Value Date   NA 141 05/17/2021   K 4.8 05/17/2021   CO2 22 05/17/2021   GLUCOSE 107 (H) 05/17/2021   BUN 27 05/17/2021   CREATININE 1.07 05/17/2021   BILITOT 0.6 05/17/2021   ALKPHOS 82 05/17/2021   AST 27  05/17/2021   ALT 21 05/17/2021   PROT 6.6 05/17/2021   ALBUMIN 4.4 05/17/2021   CALCIUM 9.1 05/17/2021   ANIONGAP 7 11/21/2019   EGFR 73 05/17/2021   GFR 77.14 07/06/2015   Lab Results  Component Value Date   CHOL 137 05/17/2021   Lab Results  Component Value Date   HDL 45 05/17/2021   Lab Results  Component Value Date   LDLCALC 76 05/17/2021   Lab Results  Component Value Date   TRIG 85 05/17/2021   Lab Results   Component Value Date   CHOLHDL 3.0 05/17/2021   No results found for: HGBA1C    Assessment & Plan:   #1 history of recurrent depression currently stable on sertraline 50 mg daily -Continue current dose of sertraline.  Follow-up for any recurrent or worsening depression symptoms.  He does have history of recurrent depression and we decided on chronic therapy at this time given multiple recurrences in the past  #2 chronic insomnia.  He is tried multiple over-the-counter things without relief.  He is on very low-dose alprazolam.  He is fully aware of cautions of benzodiazepines such as fall risk  #3 health maintenance.  Patient consents to flu vaccine and this was given.  We have encouraged him to consider Shingrix and he will check at pharmacy regarding this   No orders of the defined types were placed in this encounter.   Follow-up: No follow-ups on file.    [name], MD

## 2021-06-01 ENCOUNTER — Other Ambulatory Visit: Payer: Self-pay

## 2021-06-01 ENCOUNTER — Ambulatory Visit (INDEPENDENT_AMBULATORY_CARE_PROVIDER_SITE_OTHER): Payer: Medicare Other | Admitting: Otolaryngology

## 2021-06-01 DIAGNOSIS — H60311 Diffuse otitis externa, right ear: Secondary | ICD-10-CM

## 2021-06-01 DIAGNOSIS — H6123 Impacted cerumen, bilateral: Secondary | ICD-10-CM | POA: Diagnosis not present

## 2021-06-01 NOTE — Progress Notes (Signed)
HPI: Francis Campbell. is a 75 y.o. male who returns today for evaluation of ear canals..  He has had a chronic problem with his ear canals especially on the right side where he has had more itching and discomfort.  His ear canals were last cleaned about 3 to 4 months ago.  Past Medical History:  Diagnosis Date   Allergy    Aortic regurgitation    Arthritis    Asthma    only as child   Atrial flutter ([place])    a. Dx 09/2012.   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   TIA (transient ischemic attack)    ~2005 - had visual symptoms, was told by [name] this may have been a TIA but it's unclear   Valvular heart disease    Mild AI, ASclerosis without AS by echo 10/2011   Past Surgical History:  Procedure Laterality Date   CARDIOVERSION N/A 05/08/2013   Procedure: CARDIOVERSION;  Surgeon: Darlin Coco, MD;  [facility];  Service: Cardiovascular;  Laterality: N/A;   CATARACT EXTRACTION     Right 12/2019   CATARACT EXTRACTION     CYSTOSCOPY/RETROGRADE/URETEROSCOPY/STONE EXTRACTION WITH BASKET Right 02/12/2013   Procedure: CYSTOSCOPY/RETROGRADE/URETEROSCOPY/STONE EXTRACTION WITH BASKET, WITH  STENT PLACEMENT RIGHT URETERAL DILATION;  Surgeon: [name], MD;  Location: [facility] ORS;  Service: Urology;  Laterality: Right;   EYE SURGERY Bilateral 1950, 1960, 1964   left, right, left   HERNIA REPAIR  2005   KNEE SURGERY Bilateral 1990, 2000   LITHOTRIPSY     ROTATOR CUFF REPAIR Left 2000   TONSILLECTOMY     Social History   Socioeconomic History   Marital status: Married    Spouse name: Not on file   Number of children: 2   Years of education: Not on file   Highest education level: Not on file  Occupational History   Occupation: TECHNICAL SUP    Employer: [employer]   Tobacco Use   Smoking status: Never   Smokeless tobacco: Never  Vaping Use   Vaping Use: Never used  Substance and Sexual Activity   Alcohol use: No   Drug use: No   Sexual activity: Yes    Birth control/protection: None  Other Topics Concern   Not on file  Social History Narrative   Non smoker  No ets          Social Determinants of Radio broadcast assistant Strain: Low Risk    Difficulty of Paying Living Expenses: Not hard at all  Food Insecurity: No Food Insecurity   Worried About Charity fundraiser in the Last Year: Never true   [place] in the Last Year: Never true  Transportation Needs: No Transportation Needs   Lack of Transportation (Medical): No   Lack of Transportation (Non-Medical): No  Physical Activity: Insufficiently Active   Days of Exercise per Week: 2 days   Minutes of Exercise per Session: 20 min  Stress: No Stress Concern Present   Feeling of Stress : Not at all  Social Connections: Socially Integrated   Frequency of Communication with Friends and Family: Three times a week   Frequency of Social Gatherings with Friends and Family: Three times  a week   Attends Religious Services: More than 4 times per year   Active Member of Clubs or Organizations: Yes   Attends Music therapist: More than 4 times per year   Marital Status: Married   Family History  Problem Relation Age of Onset   Stroke Mother    Hypertension Mother    Kidney disease Mother        stones   Nephrolithiasis Brother    Arthritis Paternal Grandmother    Heart disease Paternal Grandmother    Leukemia Father    Heart disease Father    Nephrolithiasis Father    Kidney cancer Son    Appendicitis Son    Heart attack Neg Hx    Colon cancer Neg Hx    Esophageal cancer Neg Hx    Pancreatic cancer Neg Hx    Stomach cancer Neg Hx    Liver disease Neg Hx    Allergies  Allergen Reactions   Chocolate Palpitations   Chocolate Flavor Shortness Of Breath     Other reaction(s): Respiratory Distress   Dextroamphetamine Shortness Of Breath    Other reaction(s): Respiratory Distress   Dextromethorphan Palpitations   Flomax [Tamsulosin Hcl] Other (See Comments)    ? Causes A Fib   Floxin [Ofloxacin] Other (See Comments)    hallucinations   Guaifenesin Shortness Of Breath   Lisinopril Cough   Tavist Allergy [Clemastine Fumarate] Other (See Comments)    A fib worse   Clemastine     Other reaction(s): Irregular Heart Rate   Robitussin Cold+Flu Daytime  [Dm-Phenylephrine-Acetaminophen]     Other reaction(s): Respiratory Distress   Tamsulosin Other (See Comments)    Other reaction(s): Irregular Heart Rate   Amiodarone Other (See Comments)    Abn  thyroid   Robitussin Cf [Pseudoephedrine-Dm-Gg] Palpitations   Prior to Admission medications   Medication Sig Start Date End Date Taking? Authorizing Provider  ALPRAZolam (XANAX) 0.25 MG tablet TAKE 1 TABLET BY MOUTH AT BEDTIME AS NEEDED FOR ANXIETY OR SLEEP. 02/16/21   Burchette, Alinda Sierras, MD  Cholecalciferol (VITAMIN D3) 1000 UNITS CAPS Take 2,000 Units by mouth every morning.    [provider]  diltiazem (CARDIZEM CD) 240 MG 24 hr capsule TAKE 1 CAPSULE BY MOUTH EVERY MORNING 04/17/21   Skeet Latch, MD  flecainide [facility]) 100 MG tablet TAKE 1 TABLET TWICE DAILY 11/21/20   Skeet Latch, MD  losartan (COZAAR) 25 MG tablet TAKE 1 TABLET EVERY DAY 11/21/20   Skeet Latch, MD  metoprolol succinate (TOPROL-XL) 25 MG 24 hr tablet TAKE 1 TABLET EVERY DAY WITH OR IMMEDIATELY FOLLOWING A MEAL 11/21/20   Skeet Latch, MD  Multiple Vitamin (MULTIVITAMIN) tablet Take 1 tablet by mouth daily.    [provider]  neomycin-polymyxin-hydrocortisone (CORTISPORIN) OTIC solution Place 4 drops into the right ear 4 (four) times daily. 01/17/21   Burchette, Alinda Sierras, MD  potassium citrate (UROCIT-K) 10 MEQ (1080 MG) SR tablet TAKE 1 TABLET THREE TIMES DAILY 07/11/20   Irine Seal, MD   rosuvastatin (CRESTOR) 20 MG tablet TAKE 1 TABLET EVERY DAY (NEW DOSE) 11/21/20   Skeet Latch, MD  sertraline (ZOLOFT) 50 MG tablet TAKE 1 TABLET EVERY DAY 03/13/21   Burchette, Alinda Sierras, MD  warfarin (COUMADIN) 5 MG tablet TAKE 1 TO 1 AND 1/2 TABLETS DAILY AS DIRECTED BY COUMADIN CLINIC 05/23/21   Skeet Latch, MD     Positive ROS: Otherwise negative  All other systems have been reviewed and were otherwise  negative with the exception of those mentioned in the HPI and as above.  Physical Exam: Constitutional: Alert, well-appearing, no acute distress Ears: External ears without lesions or tenderness.  Right ear canal is little bit smaller than the left ear canal.  He had a large amount of hairs in the external portion of both ear canals which was partially removed with forceps.  Has slight moisture in the right ear canal and after cleaning the right ear canal with hydroperoxide and suction I applied gentian violet actually to both ear canals along with some CSF powder.  He had minimal debris on the left side which was cleaned with suction in hairs removed with forceps.  Both TMs were clear. Nasal: External nose without lesions.. Clear nasal passages Oral: Lips and gums without lesions. Tongue and palate mucosa without lesions. Posterior oropharynx clear. Neck: No palpable adenopathy or masses Respiratory: Breathing comfortably  Skin: No facial/neck lesions or rash noted.  Cerumen impaction removal  Date/Time: 06/01/2021 5:55 PM Performed by: Rozetta Nunnery, MD Authorized by: Rozetta Nunnery, MD   Consent:    Consent obtained:  Verbal   Consent given by:  Patient   Risks discussed:  Pain and bleeding Procedure details:    Location:  L ear and R ear   Procedure type: suction and forceps   Post-procedure details:    Inspection:  TM intact and canal normal   Hearing quality:  Improved   Procedure completion:  Tolerated well, no immediate complications Comments:      Both ear canals were cleaned with forceps and suction.  After cleaning the ear canals I applied gentian violet and CSF powder to both ear canals.  Assessment: Mild wax buildup with mild chronic right external otitis.  Plan: Cleaned both ear canals in the office today and applied gentian violet and CSF powder to both ear canals.  Recommend keeping the ear canals dry. He will follow-up as needed.   Radene Journey, MD

## 2021-06-02 ENCOUNTER — Telehealth: Payer: Self-pay | Admitting: Pharmacist

## 2021-06-02 NOTE — Chronic Care Management (AMB) (Signed)
Chronic Care Management Pharmacy Assistant   Name: Francis Campbell.  MRN: [id number] DOB: 01/16/1946  Reason for Encounter: Disease State/ Hypertension Assessment Call   Conditions to be addressed/monitored: HTN  Recent office visits:  05-29-2021 [name], MD - Patient presented for Chronic insomnia and other concerns. No medication changes.  05-08-2021 Randel Pigg, LPN - Patient presented for Medicare Wellness Exam. No medication changes.  Recent consult visits:  06-01-2021 Rozetta Nunnery, MD (Otolaryngology) - Patient presented for Bilateral impacted cerumen. No medication changes.  05-17-2021 Anti Coag Visit- Patient presented for Anti Coag Visit reading was 3.0  05-10-2021 Skeet Latch, MD (Cardiology) - Patient presented for Paroxysmal atrial fibrillation and other concerns. Stopped Digoxin 125 mcg, Losartan HCTZ 12.5 mg, Pantoprazole 40 mg & Tumeric.  02-20-2021 Rozetta Nunnery, MD (Otolaryngology) - Patient presented forChronic diffuse otitis externa of right ear and other concerns. No medication changes.  Hospital visits:  None in previous 6 months  Medications: Outpatient Encounter Medications as of 06/02/2021  Medication Sig   ALPRAZolam (XANAX) 0.25 MG tablet TAKE 1 TABLET BY MOUTH AT BEDTIME AS NEEDED FOR ANXIETY OR SLEEP.   Cholecalciferol (VITAMIN D3) 1000 UNITS CAPS Take 2,000 Units by mouth every morning.   diltiazem (CARDIZEM CD) 240 MG 24 hr capsule TAKE 1 CAPSULE BY MOUTH EVERY MORNING   flecainide (TAMBOCOR) 100 MG tablet TAKE 1 TABLET TWICE DAILY   losartan (COZAAR) 25 MG tablet TAKE 1 TABLET EVERY DAY   metoprolol succinate (TOPROL-XL) 25 MG 24 hr tablet TAKE 1 TABLET EVERY DAY WITH OR IMMEDIATELY FOLLOWING A MEAL   Multiple Vitamin (MULTIVITAMIN) tablet Take 1 tablet by mouth daily.   neomycin-polymyxin-hydrocortisone (CORTISPORIN) OTIC solution Place 4 drops into the right ear 4 (four) times daily.   potassium citrate (UROCIT-K)  10 MEQ (1080 MG) SR tablet TAKE 1 TABLET THREE TIMES DAILY   rosuvastatin (CRESTOR) 20 MG tablet TAKE 1 TABLET EVERY DAY (NEW DOSE)   sertraline (ZOLOFT) 50 MG tablet TAKE 1 TABLET EVERY DAY   warfarin (COUMADIN) 5 MG tablet TAKE 1 TO 1 AND 1/2 TABLETS DAILY AS DIRECTED BY COUMADIN CLINIC   No facility-administered encounter medications on file as of 06/02/2021.  Reviewed chart prior to disease state call. Spoke with patient regarding BP  Recent Office Vitals: BP Readings from Last 3 Encounters:  05/29/21 110/60  05/10/21 110/62  01/17/21 120/60   Pulse Readings from Last 3 Encounters:  05/29/21 (!) 55  05/10/21 (!) 51  01/17/21 60    Wt Readings from Last 3 Encounters:  05/29/21 196 lb (88.9 kg)  05/10/21 196 lb 3.2 oz (89 kg)  01/17/21 196 lb 14.4 oz (89.3 kg)     Kidney Function Lab Results  Component Value Date/Time   CREATININE 1.07 05/17/2021 08:35 AM   CREATININE 0.88 11/21/2019 05:02 AM   CREATININE 0.97 12/06/2016 09:50 AM   CREATININE 1.00 04/24/2016 08:59 AM   GFR 77.14 07/06/2015 03:07 PM   GFRNONAA >60 11/21/2019 05:02 AM   GFRAA >60 11/21/2019 05:02 AM    BMP Latest Ref Rng & Units 05/17/2021 11/21/2019 11/20/2019  Glucose 65 - 99 mg/dL 107(H) 84 192(H)  BUN 8 - 27 mg/dL 27 23 27(H)  Creatinine 0.76 - 1.27 mg/dL 1.07 0.88 1.03  BUN/Creat Ratio 10 - 24 25(H) - -  Sodium 134 - 144 mmol/L 141 142 136  Potassium 3.5 - 5.2 mmol/L 4.8 3.7 3.8  Chloride 96 - 106 mmol/L 103 111 104  CO2  20 - 29 mmol/L 22 24 18(L)  Calcium 8.6 - 10.2 mg/dL 9.1 8.3(L) 9.9    Current antihypertensive regimen:  Losartan 25 mg 1 tablet daily   Diltiazem 240 mg 1 capsule in the morning Metoprolol succinate 25 mg 1 tablet daily - in AM How often are you checking your Blood Pressure? Patient reports he is checking his pressures about once a week. Current home BP readings: 115/60 was last reading patient reports he remains in that range. What recent interventions/DTPs have been made by  any provider to improve Blood Pressure control since last CPP Visit: Patient reports he has recently seen Dr's Burchette and Oval Linsey no changes. Any recent hospitalizations or ED visits since last visit with CPP? None What diet changes have been made to improve Blood Pressure Control?  Patient reports he is retired and his wife prepares his meals with no salt he reports half of their meals are eaten at home and the other half they do go out. What exercise is being done to improve your Blood Pressure Control?  Patient reports he is doing yard work, he rides a bike and is working at [employer] with [employer] and walks all over campus.  Adherence Review: Is the patient currently on ACE/ARB medication? Yes Does the patient have >5 day gap between last estimated fill dates? No Patient was advised of upcoming appointment with clinical pharmacist and was in agreement.  Care Gaps: Zoster Vaccine - Overdue CCM - 08-25-2021 AWV - Done 05-08-2021  Star Rating Drugs: Rosuvastatin (Crestor) 20 mg - Last filled 02-18-2021 90 DS Losartan (Cozaar) 25 mg - Last filled 02-18-2021 90 DS   Fill History Gaps :Yes Call to Pharmacy spoke to [facility] she reports  Rosuvastatin (Crestor) 20 mg - Last filled 05-20-2021 90 DS at [place] Losartan (Cozaar) 25 mg - Last filled 05-20-2021 90 DS at [place] Pharmacist Assistant [phone]

## 2021-06-07 ENCOUNTER — Encounter: Payer: Self-pay | Admitting: Family Medicine

## 2021-06-13 ENCOUNTER — Encounter: Payer: Self-pay | Admitting: Family Medicine

## 2021-06-13 MED ORDER — NEOMYCIN-POLYMYXIN-HC 3.5-10000-1 OT SOLN
4.0000 [drp] | Freq: Four times a day (QID) | OTIC | 0 refills | Status: DC
Start: 2021-06-13 — End: 2021-12-05

## 2021-06-19 DIAGNOSIS — Z23 Encounter for immunization: Secondary | ICD-10-CM | POA: Diagnosis not present

## 2021-06-20 ENCOUNTER — Ambulatory Visit (INDEPENDENT_AMBULATORY_CARE_PROVIDER_SITE_OTHER): Payer: Medicare Other | Admitting: Family Medicine

## 2021-06-20 ENCOUNTER — Other Ambulatory Visit: Payer: Self-pay | Admitting: Family Medicine

## 2021-06-20 ENCOUNTER — Other Ambulatory Visit: Payer: Self-pay

## 2021-06-20 VITALS — BP 130/60 | HR 70 | Temp 98.2°F | Wt 200.1 lb

## 2021-06-20 DIAGNOSIS — R229 Localized swelling, mass and lump, unspecified: Secondary | ICD-10-CM

## 2021-06-20 DIAGNOSIS — L929 Granulomatous disorder of the skin and subcutaneous tissue, unspecified: Secondary | ICD-10-CM | POA: Diagnosis not present

## 2021-06-20 NOTE — Patient Instructions (Signed)
Keep wound dry for the first 24 hours then clean daily with soap and water for one week. Apply vaseline daily for 6-7 days. Keep covered with clean dressing for 6-7 days. Follow up promptly for any signs of infection such as redness, warmth, pain, or drainage. We will call with pathology results- usually in 7 to 10 days.

## 2021-06-20 NOTE — Progress Notes (Signed)
Established Patient Office Visit  Subjective:  Patient ID: Francis Campbell., male    DOB: 1945-12-08  Age: 75 y.o. MRN: [id number]  CC:  Chief Complaint  Patient presents with   Cyst    Left elbow, x 6 months, no pain/discomfort unless he hits it, tender to the touch    HPI [employer]. presents for nodular lesion left elbow for about 6 months.  No associated pain other than the fact he has little bit of sensitivity when he bumps this.  This has been slowly growing in size.  No drainage.  Does not recall any injury to this region.  No past history of known skin cancer.  He does take Coumadin but has been off the past few days for anticipation of possible hand surgery later this week.  Past Medical History:  Diagnosis Date   Allergy    Aortic regurgitation    Arthritis    Asthma    only as child   Atrial flutter ([place])    a. Dx 09/2012.   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   TIA (transient ischemic attack)    ~2005 - had visual symptoms, was told by [name] this may have been a TIA but it's unclear   Valvular heart disease    Mild AI, ASclerosis without AS by echo 10/2011    Past Surgical History:  Procedure Laterality Date   CARDIOVERSION N/A 05/08/2013   Procedure: CARDIOVERSION;  Surgeon: Darlin Coco, MD;  Location: [facility] ENDOSCOPY;  Service: Cardiovascular;  Laterality: N/A;   CATARACT EXTRACTION     Right 12/2019   CATARACT EXTRACTION     CYSTOSCOPY/RETROGRADE/URETEROSCOPY/STONE EXTRACTION WITH BASKET Right 02/12/2013   Procedure: CYSTOSCOPY/RETROGRADE/URETEROSCOPY/STONE EXTRACTION WITH BASKET, WITH  STENT PLACEMENT RIGHT URETERAL DILATION;  Surgeon: [name], MD;  Location: [facility] ORS;  Service: Urology;  Laterality: Right;   EYE SURGERY Bilateral  1950, 1960, 1964   left, right, left   HERNIA REPAIR  2005   KNEE SURGERY Bilateral 1990, 2000   LITHOTRIPSY     ROTATOR CUFF REPAIR Left 2000   TONSILLECTOMY      Family History  Problem Relation Age of Onset   Stroke Mother    Hypertension Mother    Kidney disease Mother        stones   Nephrolithiasis Brother    Arthritis Paternal Grandmother    Heart disease Paternal Grandmother    Leukemia Father    Heart disease Father    Nephrolithiasis Father    Kidney cancer Son    Appendicitis Son    Heart attack Neg Hx    Colon cancer Neg Hx    Esophageal cancer Neg Hx    Pancreatic cancer Neg Hx    Stomach cancer Neg Hx    Liver disease Neg Hx     Social History   Socioeconomic History   Marital status: Married    Spouse name: Not on file   Number of children: 2   Years of education: Not on file   Highest education level: Not on file  Occupational History   Occupation: TECHNICAL SUP    Employer: [employer]  Tobacco Use   Smoking status: Never  Smokeless tobacco: Never  Vaping Use   Vaping Use: Never used  Substance and Sexual Activity   Alcohol use: No   Drug use: No   Sexual activity: Yes    Birth control/protection: None  Other Topics Concern   Not on file  Social History Narrative   Non smoker  No ets          Social Determinants of Radio broadcast assistant Strain: Low Risk    Difficulty of Paying Living Expenses: Not hard at all  Food Insecurity: No Food Insecurity   Worried About Charity fundraiser in the Last Year: Never true   [place] in the Last Year: Never true  Transportation Needs: No Transportation Needs   Lack of Transportation (Medical): No   Lack of Transportation (Non-Medical): No  Physical Activity: Insufficiently Active   Days of Exercise per Week: 2 days   Minutes of Exercise per Session: 20 min  Stress: No Stress Concern Present   Feeling of Stress : Not at all  Social Connections: Socially Integrated   Frequency of  Communication with Friends and Family: Three times a week   Frequency of Social Gatherings with Friends and Family: Three times a week   Attends Religious Services: More than 4 times per year   Active Member of Clubs or Organizations: Yes   Attends Music therapist: More than 4 times per year   Marital Status: Married  Human resources officer Violence: Not At Risk   Fear of Current or Ex-Partner: No   Emotionally Abused: No   Physically Abused: No   Sexually Abused: No    Outpatient Medications Prior to Visit  Medication Sig Dispense Refill   ALPRAZolam (XANAX) 0.25 MG tablet TAKE 1 TABLET BY MOUTH AT BEDTIME AS NEEDED FOR ANXIETY OR SLEEP. 30 tablet 5   Cholecalciferol (VITAMIN D3) 1000 UNITS CAPS Take 2,000 Units by mouth every morning.     diltiazem (CARDIZEM CD) 240 MG 24 hr capsule TAKE 1 CAPSULE BY MOUTH EVERY MORNING 90 capsule 3   flecainide (TAMBOCOR) 100 MG tablet TAKE 1 TABLET TWICE DAILY 180 tablet 2   losartan (COZAAR) 25 MG tablet TAKE 1 TABLET EVERY DAY 90 tablet 3   metoprolol succinate (TOPROL-XL) 25 MG 24 hr tablet TAKE 1 TABLET EVERY DAY WITH OR IMMEDIATELY FOLLOWING A MEAL 90 tablet 3   Multiple Vitamin (MULTIVITAMIN) tablet Take 1 tablet by mouth daily.     neomycin-polymyxin-hydrocortisone (CORTISPORIN) OTIC solution Place 4 drops into the right ear 4 (four) times daily. 10 mL 0   potassium citrate (UROCIT-K) 10 MEQ (1080 MG) SR tablet TAKE 1 TABLET THREE TIMES DAILY 270 tablet 3   rosuvastatin (CRESTOR) 20 MG tablet TAKE 1 TABLET EVERY DAY (NEW DOSE) 90 tablet 3   sertraline (ZOLOFT) 50 MG tablet TAKE 1 TABLET EVERY DAY 90 tablet 1   warfarin (COUMADIN) 5 MG tablet TAKE 1 TO 1 AND 1/2 TABLETS DAILY AS DIRECTED BY COUMADIN CLINIC 135 tablet 1   No facility-administered medications prior to visit.    Allergies  Allergen Reactions   Chocolate Palpitations   Chocolate Flavor Shortness Of Breath    Other reaction(s): Respiratory Distress   Dextroamphetamine  Shortness Of Breath    Other reaction(s): Respiratory Distress   Dextromethorphan Palpitations   Flomax [Tamsulosin Hcl] Other (See Comments)    ? Causes A Fib   Floxin [Ofloxacin] Other (See Comments)    hallucinations   Guaifenesin Shortness Of Breath  Lisinopril Cough   Tavist Allergy [Clemastine Fumarate] Other (See Comments)    A fib worse   Clemastine     Other reaction(s): Irregular Heart Rate   Robitussin Cold+Flu Daytime  [Dm-Phenylephrine-Acetaminophen]     Other reaction(s): Respiratory Distress   Tamsulosin Other (See Comments)    Other reaction(s): Irregular Heart Rate   Amiodarone Other (See Comments)    Abn  thyroid   Robitussin Cf [Pseudoephedrine-Dm-Gg] Palpitations    ROS Review of Systems  Constitutional:  Negative for appetite change, chills, fever and unexpected weight change.  Hematological:  Negative for adenopathy.     Objective:    Physical Exam Vitals reviewed.  Constitutional:      Appearance: Normal appearance.  Cardiovascular:     Rate and Rhythm: Normal rate.  Skin:    Comments: 1 cm nodular lesion left elbow.  He has slightly scabbed center.  Neurological:     Mental Status: He is alert.    BP 130/60 (BP Location: Left Arm, Patient Position: Sitting, Cuff Size: Normal)   Pulse 70   Temp 98.2 F (36.8 C) (Oral)   Wt 200 lb 1.6 oz (90.8 kg)   SpO2 98%   BMI 25.69 kg/m  Wt Readings from Last 3 Encounters:  06/20/21 200 lb 1.6 oz (90.8 kg)  05/29/21 196 lb (88.9 kg)  05/10/21 196 lb 3.2 oz (89 kg)     Health Maintenance Due  Topic Date Due   Zoster Vaccines- Shingrix (1 of 2) Never done    There are no preventive care reminders to display for this patient.  Lab Results  Component Value Date   TSH 4.07 08/06/2013   Lab Results  Component Value Date   WBC 7.5 11/21/2019   HGB 14.4 11/21/2019   HCT 45.6 11/21/2019   MCV 90.5 11/21/2019   PLT 174 11/21/2019   Lab Results  Component Value Date   NA 141 05/17/2021    K 4.8 05/17/2021   CO2 22 05/17/2021   GLUCOSE 107 (H) 05/17/2021   BUN 27 05/17/2021   CREATININE 1.07 05/17/2021   BILITOT 0.6 05/17/2021   ALKPHOS 82 05/17/2021   AST 27 05/17/2021   ALT 21 05/17/2021   PROT 6.6 05/17/2021   ALBUMIN 4.4 05/17/2021   CALCIUM 9.1 05/17/2021   ANIONGAP 7 11/21/2019   EGFR 73 05/17/2021   GFR 77.14 07/06/2015   Lab Results  Component Value Date   CHOL 137 05/17/2021   Lab Results  Component Value Date   HDL 45 05/17/2021   Lab Results  Component Value Date   LDLCALC 76 05/17/2021   Lab Results  Component Value Date   TRIG 85 05/17/2021   Lab Results  Component Value Date   CHOLHDL 3.0 05/17/2021   No results found for: HGBA1C    Assessment & Plan:   Problem List Items Addressed This Visit   None Visit Diagnoses     Skin nodule    -  Primary   Relevant Orders   Surgical pathology     Nodular lesion left elbow.  Present for several months.  Rule out squamous cell carcinoma  Discussed risks and benefits of shave excision including risks of bleeding, bruising, scar formation, and infection and patient consented to proceed.  Anesthesia with 1% plain xylocaine.  Skin prepped with betadine and alcohol and shave excision of lesion with #15 blade .  [name]  Patient tolerated well.  Vaseline and dressing  applied. Follow-up promptly for any signs of  secondary infection. Further disposition depending on pathology results.   No orders of the defined types were placed in this encounter.   Follow-up: No follow-ups on file.    [name], MD

## 2021-06-21 ENCOUNTER — Encounter (HOSPITAL_BASED_OUTPATIENT_CLINIC_OR_DEPARTMENT_OTHER): Payer: Self-pay

## 2021-06-22 DIAGNOSIS — Z4789 Encounter for other orthopedic aftercare: Secondary | ICD-10-CM | POA: Diagnosis not present

## 2021-07-04 ENCOUNTER — Ambulatory Visit (INDEPENDENT_AMBULATORY_CARE_PROVIDER_SITE_OTHER): Payer: Medicare Other | Admitting: *Deleted

## 2021-07-04 ENCOUNTER — Other Ambulatory Visit: Payer: Self-pay

## 2021-07-04 DIAGNOSIS — Z5181 Encounter for therapeutic drug level monitoring: Secondary | ICD-10-CM

## 2021-07-04 DIAGNOSIS — I480 Paroxysmal atrial fibrillation: Secondary | ICD-10-CM

## 2021-07-04 LAB — POCT INR: INR: 2.1 (ref 2.0–3.0)

## 2021-07-04 NOTE — Patient Instructions (Signed)
Description   Continue taking 49m daily except 7.519mevery Monday, Wednesday and Friday. Recheck INR in 5 weeks. [facility] [phone]

## 2021-07-17 ENCOUNTER — Other Ambulatory Visit: Payer: Self-pay | Admitting: Family Medicine

## 2021-07-27 DIAGNOSIS — Z8669 Personal history of other diseases of the nervous system and sense organs: Secondary | ICD-10-CM | POA: Diagnosis not present

## 2021-07-27 DIAGNOSIS — H938X3 Other specified disorders of ear, bilateral: Secondary | ICD-10-CM | POA: Diagnosis not present

## 2021-07-27 DIAGNOSIS — J342 Deviated nasal septum: Secondary | ICD-10-CM | POA: Diagnosis not present

## 2021-07-27 DIAGNOSIS — H61893 Other specified disorders of external ear, bilateral: Secondary | ICD-10-CM | POA: Diagnosis not present

## 2021-08-08 ENCOUNTER — Other Ambulatory Visit: Payer: Self-pay

## 2021-08-08 ENCOUNTER — Ambulatory Visit (INDEPENDENT_AMBULATORY_CARE_PROVIDER_SITE_OTHER): Payer: Medicare Other | Admitting: Pharmacist

## 2021-08-08 DIAGNOSIS — Z5181 Encounter for therapeutic drug level monitoring: Secondary | ICD-10-CM

## 2021-08-08 DIAGNOSIS — I480 Paroxysmal atrial fibrillation: Secondary | ICD-10-CM

## 2021-08-08 LAB — POCT INR: INR: 3.3 — AB (ref 2.0–3.0)

## 2021-08-08 NOTE — Patient Instructions (Signed)
Description   Hold warfarin tomorrow and then continue taking 29m daily except 7.54mevery Monday, Wednesday and Friday. Recheck INR in 2 weeks. [facility] [phone]

## 2021-08-20 ENCOUNTER — Other Ambulatory Visit: Payer: Self-pay | Admitting: Family Medicine

## 2021-08-22 ENCOUNTER — Other Ambulatory Visit: Payer: Self-pay

## 2021-08-22 ENCOUNTER — Ambulatory Visit (INDEPENDENT_AMBULATORY_CARE_PROVIDER_SITE_OTHER): Payer: Medicare Other | Admitting: *Deleted

## 2021-08-22 DIAGNOSIS — Z5181 Encounter for therapeutic drug level monitoring: Secondary | ICD-10-CM

## 2021-08-22 DIAGNOSIS — I480 Paroxysmal atrial fibrillation: Secondary | ICD-10-CM

## 2021-08-22 LAB — POCT INR: INR: 2.5 (ref 2.0–3.0)

## 2021-08-22 NOTE — Patient Instructions (Signed)
Description   Continue taking 41m daily except 7.543mevery Monday, Wednesday and Friday. Recheck INR in 4 weeks. [facility] [phone]

## 2021-08-24 ENCOUNTER — Telehealth: Payer: Self-pay | Admitting: Pharmacist

## 2021-08-24 NOTE — Chronic Care Management (AMB) (Signed)
    Chronic Care Management Pharmacy Assistant   Name: Francis Campbell.  MRN: [id number] DOB: Feb 09, 1946  08/24/21 APPOINTMENT REMINDER      Patient  was reminded to have all medications, supplements and any blood glucose and blood pressure readings available for review with Jeni Salles, Pharm. D, for telephone visit on 08/25/21 at 12.    Care Gaps: Zoster Vaccine - Overdue AWV - Done 04/2021 BP- 130/60 (06/20/21)  Star Rating Drug:  Rosuvastatin (Crestor) 20 mg - Last filled 05/20/2021 90 DS at [facility] Losartan (Cozaar) 25 mg - Last filled 05/20/2021 90 DS at [facility]   Any gaps in medications fill history?  < 5 days did not verify    Medications: Outpatient Encounter Medications as of 08/24/2021  Medication Sig   ALPRAZolam (XANAX) 0.25 MG tablet TAKE 1 TABLET BY MOUTH AT BEDTIME AS NEEDED FOR ANXIETY OR SLEEP.   Cholecalciferol (VITAMIN D3) 1000 UNITS CAPS Take 2,000 Units by mouth every morning.   diltiazem (CARDIZEM CD) 240 MG 24 hr capsule TAKE 1 CAPSULE BY MOUTH EVERY MORNING   flecainide (TAMBOCOR) 100 MG tablet TAKE 1 TABLET TWICE DAILY   losartan (COZAAR) 25 MG tablet TAKE 1 TABLET EVERY DAY   metoprolol succinate (TOPROL-XL) 25 MG 24 hr tablet TAKE 1 TABLET EVERY DAY WITH OR IMMEDIATELY FOLLOWING A MEAL   Multiple Vitamin (MULTIVITAMIN) tablet Take 1 tablet by mouth daily.   neomycin-polymyxin-hydrocortisone (CORTISPORIN) OTIC solution Place 4 drops into the right ear 4 (four) times daily.   potassium citrate (UROCIT-K) 10 MEQ (1080 MG) SR tablet TAKE 1 TABLET THREE TIMES DAILY   rosuvastatin (CRESTOR) 20 MG tablet TAKE 1 TABLET EVERY DAY (NEW DOSE)   sertraline (ZOLOFT) 50 MG tablet TAKE 1 TABLET BY MOUTH EVERY DAY   warfarin (COUMADIN) 5 MG tablet TAKE 1 TO 1 AND 1/2 TABLETS DAILY AS DIRECTED BY COUMADIN CLINIC   No facility-administered encounter medications on file as of 08/24/2021.       [place] Clinical Pharmacist  Assistant [phone]

## 2021-08-25 ENCOUNTER — Ambulatory Visit: Payer: Medicare Other | Admitting: Pharmacist

## 2021-08-25 DIAGNOSIS — I480 Paroxysmal atrial fibrillation: Secondary | ICD-10-CM

## 2021-08-25 DIAGNOSIS — F339 Major depressive disorder, recurrent, unspecified: Secondary | ICD-10-CM

## 2021-08-25 NOTE — Patient Instructions (Signed)
Hi Lee,  It was great to catch up with you again! I am glad everything is going so well!  Please reach out to me if you have any questions or need anything before our follow up!  Best, Maddie  Jeni Salles, PharmD, [facility]   Visit Information   Goals Addressed   None    Patient Care Plan: CCM Pharmacy Care Plan     Problem Identified: Problem: Hypertension, Hyperlipidemia, Atrial Fibrillation, Depression, Anxiety and Insomnia      Long-Range Goal: Patient-Specific Goal   Start Date: 02/17/2021  Expected End Date: 02/17/2022  Recent Progress: On track  Priority: High  Note:   Current Barriers:  Unable to independently monitor therapeutic efficacy  Pharmacist Clinical Goal(s):  Patient will achieve adherence to monitoring guidelines and medication adherence to achieve therapeutic efficacy through collaboration with PharmD and provider.   Interventions: 1:1 collaboration with [name], MD regarding development and update of comprehensive plan of care as evidenced by provider attestation and co-signature Inter-disciplinary care team collaboration (see longitudinal plan of care) Comprehensive medication review performed; medication list updated in electronic medical record  Hypertension (BP goal <140/90) -Controlled -Current treatment: Losartan 25 mg 1 tablet daily   Diltiazem 240 mg 1 capsule in the morning Metoprolol succinate 25 mg 1 tablet daily - in AM -Medications previously tried: none  -Current home readings: 115/70 (checking once a month) -Current dietary habits: did not discuss -Current exercise habits: did not discuss -Denies hypotensive/hypertensive symptoms -Educated on BP goals and benefits of medications for prevention of heart attack, stroke and kidney damage; Exercise goal of 150 minutes per week; Importance of home blood pressure monitoring; Proper BP monitoring  technique; -Counseled to monitor BP at home weekly, document, and provide log at future appointments -Counseled on diet and exercise extensively Recommended to continue current medication  Hyperlipidemia: (LDL goal < 100) -Controlled -Current treatment: Rosuvastatin 20 mg 1 tablet daily -Medications previously tried: none  -Current dietary patterns: did not discuss -Current exercise habits: did not discuss -Educated on Cholesterol goals;  Importance of limiting foods high in cholesterol; Exercise goal of 150 minutes per week; -Counseled on diet and exercise extensively Recommended to continue current medication Recommended repeat lipid panel with cardiology.  Atrial Fibrillation (Goal: prevent stroke and major bleeding) -Controlled -CHADSVASC: 2 -Current treatment: Rate/rhythm control: Diltiazem 240 mg 1 capsule in the morning; Metoprolol succinate 25 mg 1 tablet daily in morning; Flecainide 100 mg 1 tablet twice daily Anticoagulation: Warfarin 5 mg, 7.5 mg on Mon, Wed and Fri and 5 mg on all other days -Medications previously tried: none -Home BP and HR readings: 70s (checking once a month) -Counseled on importance of adherence to anticoagulant exactly as prescribed; avoidance of NSAIDs due to increased bleeding risk with anticoagulants; -Counseled on diet and exercise extensively Recommended to continue current medication  Depression/Anxiety (Goal: minimize symptoms) -Controlled -Current treatment: Sertraline 50 mg 1 tablet daily -Medications previously tried/failed: none -PHQ9: 0 -GAD7: n/a -Educated on Benefits of medication for symptom control -Recommended to continue current medication  Insomnia (Goal: improve quality and quantity of sleep) -Not ideally controlled -Current treatment  Alprazolam 0.25 mg 1 tablet at bedtime as needed -Medications previously tried: none  -Counseled on long term risks of taking benzodiazepines such as increased risk for bone fractures  and memory loss - patient reports taking almost every night  BPH (Goal: minimize symptoms) -Controlled -Current treatment  No medications -Medications previously tried: Alfuzosin (flare up)  -  Recommended to continue as is  Health Maintenance -Vaccine gaps: shingrix (2nd dose) -Current therapy:  Vitamin D 2000 units 1 capsule daily Multivitamin 1 tablet daily Potassium citrate 10 mEq 1 tablet 3 times daily (urology) -Educated on Cost vs benefit of each product must be carefully weighed by individual consumer -Patient is satisfied with current therapy and denies issues -Recommended to continue current medication  Patient Goals/Self-Care Activities Patient will:  - check blood pressure weekly, document, and provide at future appointments target a minimum of 150 minutes of moderate intensity exercise weekly  Follow Up Plan: Telephone follow up appointment with care management team member scheduled for: 1 year       Patient verbalizes understanding of instructions provided today and agrees to view in [place].  Telephone follow up appointment with pharmacy team member scheduled for: 1 year  Viona Gilmore, [facility]

## 2021-08-25 NOTE — Progress Notes (Signed)
Chronic Care Management Pharmacy Note  08/25/2021 Name:  Francis Campbell. MRN:  [id number] DOB:  Jan 22, 1946  Summary: BP at goal <140/90 per home and office readings  Recommendations/Changes made from today's visit: -Recommend routine BP monitoring at home  Plan: BP assessment in 3-4 months  Subjective: Francis Campbell. is an 75 y.o. year old male who is a primary patient of Burchette, Alinda Sierras, MD.  The CCM team was consulted for assistance with disease management and care coordination needs.    Engaged with patient by telephone for follow up visit in response to provider referral for pharmacy case management and/or care coordination services.   Consent to Services:  The patient was given information about Chronic Care Management services, agreed to services, and gave verbal consent prior to initiation of services.  Please see initial visit note for detailed documentation.   Patient Care Team: [name], MD as PCP - General (Family Medicine) Skeet Latch, MD as PCP - Cardiology (Cardiology) Johnathan Hausen, MD as Consulting Physician (General Surgery) Milus Banister, MD as Attending Physician (Gastroenterology) Irine Seal, MD as Attending Physician (Urology) Viona Gilmore, [facility] as Pharmacist (Pharmacist)  Recent office visits: 06/20/21 [name], MD: Patient presented for cyst on left elbow. Will forward sample to pathology.  05-29-2021 Burchette, Alinda Sierras, MD - Patient presented for annual exam. Influenza vaccine administered. No medication changes.   05-08-2021 Randel Pigg, LPN - Patient presented for Medicare Wellness Exam. No medication changes.  Recent consult visits: 08/22/21 Johny Shock, RN (cardiology): Patient presented for anti-coag visit. INR 2.5, goal 2-3. Continued warfarin 7.5 mg (5 mg x 1.5) every Mon, Wed, Fri; 5 mg (5 mg x 1) all other days.   07/27/21 Pietro Cassis, PA-C (otolaryngology): Patient presented for cerumen  impaction removal as an initial patient.  07/27/21 Havery Moros (ortho): Patient presented for follow up. Unable to access notes.  06/01/21 Rozetta Nunnery, MD (Otolaryngology) - Patient presented for Bilateral impacted cerumen. No medication changes.  05/10/21 Skeet Latch, MD (Cardiology) - Patient presented for Paroxysmal atrial fibrillation and other concerns. Stopped Digoxin 125 mcg, Losartan HCTZ 12.5 mg, Pantoprazole 40 mg & Tumeric.   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 No medication changes.  12/08/20 Tye Savoy, NP (gastro): Patient presented with abdominal pain.   10/05/20 Irine Seal (urology): Unable to access notes.  Hospital visits: None in previous 6 months  Objective:  Lab Results  Component Value Date   CREATININE 1.07 05/17/2021   BUN 27 05/17/2021   GFR 77.14 07/06/2015   GFRNONAA >60 11/21/2019   GFRAA >60 11/21/2019   NA 141 05/17/2021   K 4.8 05/17/2021   CALCIUM 9.1 05/17/2021   CO2 22 05/17/2021   GLUCOSE 107 (H) 05/17/2021    Lab Results  Component Value Date/Time   GFR 77.14 07/06/2015 03:07 PM   GFR 82.79 04/08/2015 11:57 AM    Last diabetic Eye exam: No results found for: HMDIABEYEEXA  Last diabetic Foot exam: No results found for: HMDIABFOOTEX   Lab Results  Component Value Date   CHOL 137 05/17/2021   HDL 45 05/17/2021   LDLCALC 76 05/17/2021   LDLDIRECT 96.0 10/07/2017   TRIG 85 05/17/2021   CHOLHDL 3.0 05/17/2021    Hepatic Function Latest Ref Rng & Units 05/17/2021 11/21/2019 11/20/2019  Total Protein 6.0 - 8.5 g/dL 6.6 5.7(L) 7.7  Albumin 3.7 - 4.7 g/dL  4.4 3.4(L) 4.7  AST 0 - 40 IU/L 27 21 29   ALT 0 - 44 IU/L 21 24 34  Alk Phosphatase 44 - 121 IU/L 82 52 69  Total Bilirubin 0.0 - 1.2 mg/dL 0.6 1.2 1.0  Bilirubin, Direct 0.0 - 0.3 mg/dL - - -    Lab Results  Component Value Date/Time   TSH 4.07 08/06/2013 09:37 AM    TSH 2.984 10/03/2012 08:42 PM    CBC Latest Ref Rng & Units 11/21/2019 11/20/2019 04/02/2018  WBC 4.0 - 10.5 K/uL 7.5 18.5(H) 7.0  Hemoglobin 13.0 - 17.0 g/dL 14.4 18.2(H) 16.4  Hematocrit 39.0 - 52.0 % 45.6 55.7(H) 49.4  Platelets 150 - 400 K/uL 174 221 182.0    No results found for: VD25OH  Clinical ASCVD: No  The 10-year ASCVD risk score (Arnett DK, et al., 2019) is: 25%   Values used to calculate the score:     Age: 49 years     Sex: Male     Is Non-Hispanic African American: No     Diabetic: No     Tobacco smoker: No     Systolic Blood Pressure: 384 mmHg     Is BP treated: Yes     HDL Cholesterol: 45 mg/dL     Total Cholesterol: 137 mg/dL    Depression screen [facility] 2/9 05/08/2021 05/04/2020 10/11/2016  Decreased Interest 0 0 0  Down, Depressed, Hopeless 0 0 0  PHQ - 2 Score 0 0 0  Altered sleeping - 0 -  Tired, decreased energy - 0 -  Change in appetite - 0 -  Feeling bad or failure about yourself  - 0 -  Trouble concentrating - 0 -  Moving slowly or fidgety/restless - 0 -  Suicidal thoughts - 0 -  PHQ-9 Score - 0 -  Difficult doing work/chores - Not difficult at all -  Some recent data might be hidden     CHA2DS2/VAS Stroke Risk Points  Current as of 2 hours ago     2 >= 2 Points: High Risk  1 - 1.99 Points: Medium Risk  0 Points: Low Risk    Last Change: N/A      Details    This score determines the patient's risk of having a stroke if the  patient has atrial fibrillation.       Points Metrics  0 Has Congestive Heart Failure:  No    Current as of 2 hours ago  0 Has Vascular Disease:  No    Current as of 2 hours ago  1 Has Hypertension:  Yes    Current as of 2 hours ago  1 Age:  18    Current as of 2 hours ago  0 Has Diabetes:  No    Current as of 2 hours ago  0 Had Stroke:  No  Had TIA:  No  Had Thromboembolism:  No    Current as of 2 hours ago  0 Male:  No    Current as of 2 hours ago       Social History   Tobacco Use  Smoking Status Never   Smokeless Tobacco Never   BP Readings from Last 3 Encounters:  06/20/21 130/60  05/29/21 110/60  05/10/21 110/62   Pulse Readings from Last 3 Encounters:  06/20/21 70  05/29/21 (!) 55  05/10/21 (!) 51   Wt Readings from Last 3 Encounters:  06/20/21 200 lb 1.6 oz (90.8 kg)  05/29/21 196 lb (88.9  kg)  05/10/21 196 lb 3.2 oz (89 kg)   BMI Readings from Last 3 Encounters:  06/20/21 25.69 kg/m  05/29/21 25.16 kg/m  05/10/21 25.19 kg/m    Assessment/Interventions: Review of patient past medical history, allergies, medications, health status, including review of consultants reports, laboratory and other test data, was performed as part of comprehensive evaluation and provision of chronic care management services.   SDOH:  (Social Determinants of Health) assessments and interventions performed: No  SDOH Screenings   Alcohol Screen: Low Risk    Last Alcohol Screening Score (AUDIT): 0  Depression (PHQ2-9): Low Risk    PHQ-2 Score: 0  Financial Resource Strain: Low Risk    Difficulty of Paying Living Expenses: Not hard at all  Food Insecurity: No Food Insecurity   Worried About Charity fundraiser in the Last Year: Never true   Ran Out of Food in the Last Year: Never true  Housing: Low Risk    Last Housing Risk Score: 0  Physical Activity: Insufficiently Active   Days of Exercise per Week: 2 days   Minutes of Exercise per Session: 20 min  Social Connections: Socially Integrated   Frequency of Communication with Friends and Family: Three times a week   Frequency of Social Gatherings with Friends and Family: Three times a week   Attends Religious Services: More than 4 times per year   Active Member of Clubs or Organizations: Yes   Attends Music therapist: More than 4 times per year   Marital Status: Married  Stress: No Stress Concern Present   Feeling of Stress : Not at all  Tobacco Use: Low Risk    Smoking Tobacco Use: Never   Smokeless Tobacco Use: Never    Passive Exposure: Not on file  Transportation Needs: No Transportation Needs   Lack of Transportation (Medical): No   Lack of Transportation (Non-Medical): No    CCM Care Plan  Allergies  Allergen Reactions   Chocolate Palpitations   Chocolate Flavor Shortness Of Breath    Other reaction(s): Respiratory Distress   Dextroamphetamine Shortness Of Breath    Other reaction(s): Respiratory Distress   Dextromethorphan Palpitations   Flomax [Tamsulosin Hcl] Other (See Comments)    ? Causes A Fib   Floxin [Ofloxacin] Other (See Comments)    hallucinations   Guaifenesin Shortness Of Breath   Lisinopril Cough   Tavist Allergy [Clemastine Fumarate] Other (See Comments)    A fib worse   Clemastine     Other reaction(s): Irregular Heart Rate   Robitussin Cold+Flu Daytime  [Dm-Phenylephrine-Acetaminophen]     Other reaction(s): Respiratory Distress   Tamsulosin Other (See Comments)    Other reaction(s): Irregular Heart Rate   Amiodarone Other (See Comments)    Abn  thyroid   Robitussin Cf [Pseudoephedrine-Dm-Gg] Palpitations    Medications Reviewed Today     Reviewed by Viona Gilmore, [facility] (Pharmacist) on 08/25/21 at 1211  Med List Status: <None>   Medication Order Taking? Sig Documenting Provider Last Dose Status Informant  ALPRAZolam (XANAX) 0.25 MG tablet [id number]  TAKE 1 TABLET BY MOUTH AT BEDTIME AS NEEDED FOR ANXIETY OR SLEEP. [name], MD  Active   Cholecalciferol (VITAMIN D3) 1000 UNITS CAPS [id number] No Take 2,000 Units by mouth every morning. [provider] Taking Active Self  diltiazem (CARDIZEM CD) 240 MG 24 hr capsule [id number] No TAKE 1 CAPSULE BY MOUTH EVERY MORNING Skeet Latch, MD Taking Active   flecainide (TAMBOCOR) 100 MG tablet [id number]  No TAKE 1 TABLET TWICE DAILY Skeet Latch, MD Taking Active   losartan (COZAAR) 25 MG tablet [id number] No TAKE 1 TABLET EVERY DAY Skeet Latch, MD Taking Active   metoprolol succinate  (TOPROL-XL) 25 MG 24 hr tablet [id number] No TAKE 1 TABLET EVERY DAY WITH OR IMMEDIATELY FOLLOWING A MEAL Skeet Latch, MD Taking Active   Multiple Vitamin (MULTIVITAMIN) tablet [id number] No Take 1 tablet by mouth daily. [provider] Taking Active Self           Med Note Wyatt Portela, ANAIS   Fri Jun 24, 2015  8:01 AM)     neomycin-polymyxin-hydrocortisone (CORTISPORIN) OTIC solution [id number] No Place 4 drops into the right ear 4 (four) times daily. [name], MD Taking Active   potassium citrate (UROCIT-K) 10 MEQ (1080 MG) SR tablet [id number] No TAKE 1 TABLET THREE TIMES DAILY Irine Seal, MD Taking Active   rosuvastatin (CRESTOR) 20 MG tablet [id number] No TAKE 1 TABLET EVERY DAY (NEW DOSE) Skeet Latch, MD Taking Active   sertraline (ZOLOFT) 50 MG tablet [id number]  TAKE 1 TABLET BY MOUTH EVERY DAY [name], MD  Active   warfarin (COUMADIN) 5 MG tablet [id number] No TAKE 1 TO 1 AND 1/2 TABLETS DAILY AS DIRECTED BY COUMADIN CLINIC Skeet Latch, MD Taking Active             Patient Active Problem List   Diagnosis Date Noted   Irregular bowel habits 11/21/2019   SBO (small bowel obstruction) ([place]) 11/20/2019   History of kidney stones 04/02/2018   Chronic insomnia 04/07/2017   Epigastric discomfort 11/23/2014   LLQ discomfort 11/23/2014   Encounter for therapeutic drug monitoring 10/22/2013   BPH associated with nocturia 08/06/2013   Atrial flutter ([place]) 11/17/2012   Anxiety 10/03/2012   Benign hypertensive heart disease without heart failure 11/28/2011   Regional enteritis of unspecified site 11/07/2011   Duodenitis 11/07/2011   Depression, recurrent ([place]) 11/06/2011   Abdominal pain 10/22/2011   Crohn disease ([place]) 10/22/2011   Anticoagulated on Coumadin 10/22/2011   Chest pain radiating to arm 09/14/2011   Leucocytosis 09/09/2011   Cough, persistent 07/30/2011   Paroxysmal atrial fibrillation (H[facility])    Pure hypercholesterolemia    Aortic  insufficiency    Systolic murmur     Immunization History  Administered Date(s) Administered   Fluad Quad(high Dose 65+) 05/28/2019, 06/13/2020, 05/29/2021   Influenza Split 07/30/2011, 07/06/2013   Influenza, High Dose Seasonal PF 05/26/2015, 05/18/2016, 06/18/2017, 07/07/2018   Influenza,inj,Quad PF,6+ Mos 06/16/2014   PFIZER(Purple Top)SARS-COV-2 Vaccination 10/22/2019, 11/17/2019, 06/14/2020, 12/30/2020   Pfizer Covid-19 Vaccine Bivalent Booster 85yr & up 06/19/2021   Pneumococcal Conjugate-13 09/17/2010, 08/31/2013   Pneumococcal Polysaccharide-23 04/08/2015   Tdap 11/27/2011   Zoster Recombinat (Shingrix) 06/19/2021   Zoster, Live 09/28/2010    Conditions to be addressed/monitored:  Hypertension, Hyperlipidemia, Atrial Fibrillation, Depression, Anxiety and Insomnia  Conditions addressed this visit: Hypertension, Atrial fibrillation  Care Plan : CCM Pharmacy Care Plan  Updates made by PViona Gilmore [place] 08/25/2021 12:00 AM     Problem: Problem: Hypertension, Hyperlipidemia, Atrial Fibrillation, Depression, Anxiety and Insomnia      Long-Range Goal: Patient-Specific Goal   Start Date: 02/17/2021  Expected End Date: 02/17/2022  Recent Progress: On track  Priority: High  Note:   Current Barriers:  Unable to independently monitor therapeutic efficacy  Pharmacist Clinical Goal(s):  Patient will achieve adherence to monitoring guidelines and medication adherence to achieve therapeutic efficacy through collaboration with PharmD and provider.  Interventions: 1:1 collaboration with [name], MD regarding development and update of comprehensive plan of care as evidenced by provider attestation and co-signature Inter-disciplinary care team collaboration (see longitudinal plan of care) Comprehensive medication review performed; medication list updated in electronic medical record  Hypertension (BP goal <140/90) -Controlled -Current treatment: Losartan 25  mg 1 tablet daily   Diltiazem 240 mg 1 capsule in the morning Metoprolol succinate 25 mg 1 tablet daily - in AM -Medications previously tried: none  -Current home readings: 115/70 (checking once a month) -Current dietary habits: did not discuss -Current exercise habits: did not discuss -Denies hypotensive/hypertensive symptoms -Educated on BP goals and benefits of medications for prevention of heart attack, stroke and kidney damage; Exercise goal of 150 minutes per week; Importance of home blood pressure monitoring; Proper BP monitoring technique; -Counseled to monitor BP at home weekly, document, and provide log at future appointments -Counseled on diet and exercise extensively Recommended to continue current medication  Hyperlipidemia: (LDL goal < 100) -Controlled -Current treatment: Rosuvastatin 20 mg 1 tablet daily -Medications previously tried: none  -Current dietary patterns: did not discuss -Current exercise habits: did not discuss -Educated on Cholesterol goals;  Importance of limiting foods high in cholesterol; Exercise goal of 150 minutes per week; -Counseled on diet and exercise extensively Recommended to continue current medication Recommended repeat lipid panel with cardiology.  Atrial Fibrillation (Goal: prevent stroke and major bleeding) -Controlled -CHADSVASC: 2 -Current treatment: Rate/rhythm control: Diltiazem 240 mg 1 capsule in the morning; Metoprolol succinate 25 mg 1 tablet daily in morning; Flecainide 100 mg 1 tablet twice daily Anticoagulation: Warfarin 5 mg, 7.5 mg on Mon, Wed and Fri and 5 mg on all other days -Medications previously tried: none -Home BP and HR readings: 70s (checking once a month) -Counseled on importance of adherence to anticoagulant exactly as prescribed; avoidance of NSAIDs due to increased bleeding risk with anticoagulants; -Counseled on diet and exercise extensively Recommended to continue current  medication  Depression/Anxiety (Goal: minimize symptoms) -Controlled -Current treatment: Sertraline 50 mg 1 tablet daily -Medications previously tried/failed: none -PHQ9: 0 -GAD7: n/a -Educated on Benefits of medication for symptom control -Recommended to continue current medication  Insomnia (Goal: improve quality and quantity of sleep) -Not ideally controlled -Current treatment  Alprazolam 0.25 mg 1 tablet at bedtime as needed -Medications previously tried: none  -Counseled on long term risks of taking benzodiazepines such as increased risk for bone fractures and memory loss - patient reports taking almost every night  BPH (Goal: minimize symptoms) -Controlled -Current treatment  No medications -Medications previously tried: Alfuzosin (flare up)  -Recommended to continue as is  Health Maintenance -Vaccine gaps: shingrix (2nd dose) -Current therapy:  Vitamin D 2000 units 1 capsule daily Multivitamin 1 tablet daily Potassium citrate 10 mEq 1 tablet 3 times daily (urology) -Educated on Cost vs benefit of each product must be carefully weighed by individual consumer -Patient is satisfied with current therapy and denies issues -Recommended to continue current medication  Patient Goals/Self-Care Activities Patient will:  - check blood pressure weekly, document, and provide at future appointments target a minimum of 150 minutes of moderate intensity exercise weekly  Follow Up Plan: Telephone follow up appointment with care management team member scheduled for: 1 year        Medication Assistance: None required.  Patient affirms current coverage meets needs.  Compliance/Adherence/Medication fill history: Care Gaps: Shingrix (second dose)  Star-Rating Drugs: Rosuvastatin (Crestor) 20 mg - Last filled 05/20/2021 90 DS at [facility] (  Cozaar) 25 mg - Last [facility]/20/2021 90 DS [facility]  Patient's preferred pharmacy is:  [id number] [place], [place] [place]Phone: [phone]: [phone] [place]Phone: [phone]: [phone] Uses pill box? Yes Pt endorses 100% compliance  We discussed: Current pharmacy is preferred with insurance plan and patient is satisfied with pharmacy services Patient decided to: Continue current medication management strategy  Care Plan and Follow Up Patient Decision:  Patient agrees to Care Plan and Follow-up.  Plan: Telephone follow up appointment with care management team member scheduled for:  1 year  MJeni Salles PharmD BEl CenizoPharmacist LOccidental Petroleumat BGrand [phone]

## 2021-08-30 ENCOUNTER — Ambulatory Visit (INDEPENDENT_AMBULATORY_CARE_PROVIDER_SITE_OTHER): Payer: Medicare Other | Admitting: Otolaryngology

## 2021-09-19 ENCOUNTER — Encounter: Payer: Self-pay | Admitting: Family Medicine

## 2021-09-19 MED ORDER — ALPRAZOLAM 0.25 MG PO TABS: ORAL_TABLET | ORAL | 5 refills | Status: DC

## 2021-09-19 NOTE — Telephone Encounter (Signed)
Pharmacy updated.

## 2021-09-19 NOTE — Telephone Encounter (Signed)
Refills sent.   I see no reason they (pharmacy) cannot refill by Wednesday, since last rx was 08-21-21.

## 2021-09-20 ENCOUNTER — Ambulatory Visit (INDEPENDENT_AMBULATORY_CARE_PROVIDER_SITE_OTHER): Payer: Medicare Other

## 2021-09-20 ENCOUNTER — Other Ambulatory Visit: Payer: Self-pay

## 2021-09-20 DIAGNOSIS — I480 Paroxysmal atrial fibrillation: Secondary | ICD-10-CM | POA: Diagnosis not present

## 2021-09-20 DIAGNOSIS — Z5181 Encounter for therapeutic drug level monitoring: Secondary | ICD-10-CM | POA: Diagnosis not present

## 2021-09-20 LAB — POCT INR: INR: 1.8 — AB (ref 2.0–3.0)

## 2021-09-20 NOTE — Patient Instructions (Signed)
TAKE extra 0.5 tablet today only and then Continue taking 80m daily except 7.570mevery Monday, Wednesday and Friday. Recheck INR in 4 weeks. [facility] [phone]

## 2021-10-16 ENCOUNTER — Other Ambulatory Visit: Payer: Self-pay | Admitting: Family Medicine

## 2021-10-18 ENCOUNTER — Other Ambulatory Visit: Payer: Self-pay

## 2021-10-18 ENCOUNTER — Ambulatory Visit (INDEPENDENT_AMBULATORY_CARE_PROVIDER_SITE_OTHER): Payer: Medicare Other

## 2021-10-18 DIAGNOSIS — I480 Paroxysmal atrial fibrillation: Secondary | ICD-10-CM | POA: Diagnosis not present

## 2021-10-18 DIAGNOSIS — Z5181 Encounter for therapeutic drug level monitoring: Secondary | ICD-10-CM | POA: Diagnosis not present

## 2021-10-18 LAB — POCT INR: INR: 3 (ref 2.0–3.0)

## 2021-10-18 NOTE — Patient Instructions (Signed)
Continue taking 27m daily except 7.579mevery Monday, Wednesday and Friday. Recheck INR in 6 weeks. [facility] [phone]

## 2021-10-22 ENCOUNTER — Other Ambulatory Visit: Payer: Self-pay | Admitting: Cardiovascular Disease

## 2021-10-24 DIAGNOSIS — R972 Elevated prostate specific antigen [PSA]: Secondary | ICD-10-CM | POA: Diagnosis not present

## 2021-10-24 DIAGNOSIS — N202 Calculus of kidney with calculus of ureter: Secondary | ICD-10-CM | POA: Diagnosis not present

## 2021-10-24 DIAGNOSIS — N5201 Erectile dysfunction due to arterial insufficiency: Secondary | ICD-10-CM | POA: Diagnosis not present

## 2021-10-24 DIAGNOSIS — R3121 Asymptomatic microscopic hematuria: Secondary | ICD-10-CM | POA: Diagnosis not present

## 2021-11-24 ENCOUNTER — Telehealth: Payer: Self-pay | Admitting: Pharmacist

## 2021-11-24 NOTE — Chronic Care Management (AMB) (Signed)
? ? ?Chronic Care Management ?Pharmacy Assistant  ? ?Name: Oluwafemi Villella.  MRN: [id number] DOB: 05-15-1946 ? ?Reason for Encounter: Disease State Hypertension Assessment ?  ?Conditions to be addressed/monitored: ?HTN ? ?Recent office visits:  ?None ? ?Recent consult visits:  ?10/18/21 Patient presented for Anti Coag Visit reading was 3.0 ? ?09/20/21 Patient presented for Anti Coag Visit reading was 1.8 ? ?Hospital visits:  ?None in previous 6 months ? ?Medications: ?Outpatient Encounter Medications as of 11/24/2021  ?Medication Sig  ? ALPRAZolam (XANAX) 0.25 MG tablet TAKE 1 TABLET BY MOUTH AT BEDTIME AS NEEDED FOR ANXIETY OR SLEEP.  ? Cholecalciferol (VITAMIN D3) 1000 UNITS CAPS Take 2,000 Units by mouth every morning.  ? diltiazem (CARDIZEM CD) 240 MG 24 hr capsule TAKE 1 CAPSULE BY MOUTH EVERY MORNING  ? flecainide (TAMBOCOR) 100 MG tablet TAKE 1 TABLET TWICE DAILY  ? losartan (COZAAR) 25 MG tablet TAKE 1 TABLET EVERY DAY  ? metoprolol succinate (TOPROL-XL) 25 MG 24 hr tablet TAKE 1 TABLET EVERY DAY WITH OR IMMEDIATELY FOLLOWING A MEAL  ? Multiple Vitamin (MULTIVITAMIN) tablet Take 1 tablet by mouth daily.  ? neomycin-polymyxin-hydrocortisone (CORTISPORIN) OTIC solution Place 4 drops into the right ear 4 (four) times daily.  ? potassium citrate (UROCIT-K) 10 MEQ (1080 MG) SR tablet TAKE 1 TABLET THREE TIMES DAILY  ? rosuvastatin (CRESTOR) 20 MG tablet TAKE 1 TABLET EVERY DAY (NEW DOSE)  ? sertraline (ZOLOFT) 50 MG tablet TAKE 1 TABLET BY MOUTH EVERY DAY  ? warfarin (COUMADIN) 5 MG tablet TAKE 1 TO 1 AND 1/2 TABLETS DAILY AS DIRECTED BY COUMADIN CLINIC  ? ?No facility-administered encounter medications on file as of 11/24/2021.  ?Reviewed chart prior to disease state call. Spoke with patient regarding BP ? ?Recent Office Vitals: ?BP Readings from Last 3 Encounters:  ?06/20/21 130/60  ?05/29/21 110/60  ?05/10/21 110/62  ? ?Pulse Readings from Last 3 Encounters:  ?06/20/21 70  ?05/29/21 (!) 55  ?05/10/21 (!) 51  ?  ?Wt  Readings from Last 3 Encounters:  ?06/20/21 200 lb 1.6 oz (90.8 kg)  ?05/29/21 196 lb (88.9 kg)  ?05/10/21 196 lb 3.2 oz (89 kg)  ?  ? ?Kidney Function ?Lab Results  ?Component Value Date/Time  ? CREATININE 1.07 05/17/2021 08:35 AM  ? CREATININE 0.88 11/21/2019 05:02 AM  ? CREATININE 0.97 12/06/2016 09:50 AM  ? CREATININE 1.00 04/24/2016 08:59 AM  ? GFR 77.14 07/06/2015 03:07 PM  ? GFRNONAA >60 11/21/2019 05:02 AM  ? GFRAA >60 11/21/2019 05:02 AM  ? ? ?BMP Latest Ref Rng & Units 05/17/2021 11/21/2019 11/20/2019  ?Glucose 65 - 99 mg/dL 107(H) 84 192(H)  ?BUN 8 - 27 mg/dL 27 23 27(H)  ?Creatinine 0.76 - 1.27 mg/dL 1.07 0.88 1.03  ?BUN/Creat Ratio 10 - 24 25(H) - -  ?Sodium 134 - 144 mmol/L 141 142 136  ?Potassium 3.5 - 5.2 mmol/L 4.8 3.7 3.8  ?Chloride 96 - 106 mmol/L 103 111 104  ?CO2 20 - 29 mmol/L 22 24 18(L)  ?Calcium 8.6 - 10.2 mg/dL 9.1 8.3(L) 9.9  ? ? ?Current antihypertensive regimen:  ?Losartan 25 mg 1 tablet daily   ?Diltiazem 240 mg 1 capsule in the morning ?Metoprolol succinate 25 mg 1 tablet daily - in AM ?How often are you checking your Blood Pressure? infrequently ?Current home BP readings:  ?BP Readings from Last 3 Encounters:  ?06/20/21 130/60  ?05/29/21 110/60  ?05/10/21 110/62  ?Patient reports he has not checked recently he reports only checking when  he is feeling symptomatic. He denies any hypo/hypertensive symptoms. ?What recent interventions/DTPs have been made by any provider to improve Blood Pressure control since last CPP Visit: Patient reports none ?Any recent hospitalizations or ED visits since last visit with CPP? No ? ?Adherence Review: ?Is the patient currently on ACE/ARB medication? Yes ?Does the patient have >5 day gap between last estimated fill dates? No ? ? ?Care Gaps: ?BP- 130/60 ( 06/20/21) ?AWV- 8/22 ?CCM-12/22 ?Zoster Vaccine - Overdue ?Colonoscopy - Overdue ? ?Star Rating Drugs: ?Losartan 25 mg - Last filled 08/16/21 90 DS at [facility] (Verified) ?Rosuvastatin 20 mg - Last filled  11/01/21 90 DS at [facility] ? ? ? ?Ned Clines CMA ?Clinical Pharmacist Assistant ?[phone] ? ?

## 2021-11-28 DIAGNOSIS — N200 Calculus of kidney: Secondary | ICD-10-CM | POA: Diagnosis not present

## 2021-11-28 DIAGNOSIS — R972 Elevated prostate specific antigen [PSA]: Secondary | ICD-10-CM | POA: Diagnosis not present

## 2021-11-29 ENCOUNTER — Ambulatory Visit (INDEPENDENT_AMBULATORY_CARE_PROVIDER_SITE_OTHER): Payer: Medicare Other

## 2021-11-29 ENCOUNTER — Other Ambulatory Visit: Payer: Self-pay

## 2021-11-29 DIAGNOSIS — Z5181 Encounter for therapeutic drug level monitoring: Secondary | ICD-10-CM

## 2021-11-29 DIAGNOSIS — I480 Paroxysmal atrial fibrillation: Secondary | ICD-10-CM

## 2021-11-29 LAB — POCT INR: INR: 2.2 (ref 2.0–3.0)

## 2021-11-29 NOTE — Patient Instructions (Signed)
Continue taking 33m daily except 7.543mevery Monday, Wednesday and Friday. Recheck INR in 6 weeks. Coumadin Clinic [phone]

## 2021-12-05 ENCOUNTER — Encounter: Payer: Self-pay | Admitting: Family Medicine

## 2021-12-05 ENCOUNTER — Ambulatory Visit (INDEPENDENT_AMBULATORY_CARE_PROVIDER_SITE_OTHER): Payer: Medicare Other | Admitting: Family Medicine

## 2021-12-05 VITALS — BP 120/76 | HR 55 | Temp 97.9°F | Ht 74.0 in | Wt 202.6 lb

## 2021-12-05 DIAGNOSIS — H9203 Otalgia, bilateral: Secondary | ICD-10-CM

## 2021-12-05 DIAGNOSIS — N202 Calculus of kidney with calculus of ureter: Secondary | ICD-10-CM | POA: Diagnosis not present

## 2021-12-05 MED ORDER — NEOMYCIN-POLYMYXIN-HC 3.5-10000-1 OT SOLN: OTIC | 0 refills | Status: DC

## 2021-12-05 NOTE — Progress Notes (Signed)
Established Patient Office Visit  Subjective:  Patient ID: Francis Campbell., male    DOB: February 22, 1946  Age: 76 y.o. MRN: [id number]  CC:  Chief Complaint  Patient presents with   Otalgia    HPI [name]. presents for bilateral earache.  Has had similar symptoms frequently in the past.  Denies any drainage.  He feels this is more in the canal.  No swimming.  No fever.  He applied some hydrogen peroxide last night without much improvement.  No hearing changes.  No dizziness.  No nasal congestive symptoms.  He retired several years ago.  He has taken a job at [employer] in [employer] center for sports.  Working 4 days/week.  He enjoys that very much.  Past Medical History:  Diagnosis Date   Allergy    Aortic regurgitation    Arthritis    Asthma    only as child   Atrial flutter (HCC)    a. Dx 09/2012.   Cataract    Esophageal reflux    Remotely   History of kidney stones    Hypercholesteremia    Hypertension    Paroxysmal atrial fibrillation (HCC)    Takes PRN flecainide   Pituitary abnormality (HCC)    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   TIA (transient ischemic attack)    ~2005 - had visual symptoms, was told by Dr. Patty Sermons this may have been a TIA but it's unclear   Valvular heart disease    Mild AI, ASclerosis without AS by echo 10/2011    Past Surgical History:  Procedure Laterality Date   CARDIOVERSION N/A 05/08/2013   Procedure: CARDIOVERSION;  Surgeon: Cassell Clement, MD;  Location: [facility] ENDOSCOPY;  Service: Cardiovascular;  Laterality: N/A;   CATARACT EXTRACTION     Right 12/2019   CATARACT EXTRACTION     CYSTOSCOPY/RETROGRADE/URETEROSCOPY/STONE EXTRACTION WITH BASKET Right 02/12/2013   Procedure: CYSTOSCOPY/RETROGRADE/URETEROSCOPY/STONE EXTRACTION WITH BASKET, WITH  STENT PLACEMENT RIGHT URETERAL DILATION;  Surgeon: Anner Crete, MD;  Location: [facility] ORS;  Service: Urology;  Laterality: Right;   EYE SURGERY Bilateral 1950, 1960, 1964   left,  right, left   HERNIA REPAIR  2005   KNEE SURGERY Bilateral 1990, 2000   LITHOTRIPSY     ROTATOR CUFF REPAIR Left 2000   TONSILLECTOMY      Family History  Problem Relation Age of Onset   Stroke Mother    Hypertension Mother    Kidney disease Mother        stones   Nephrolithiasis Brother    Arthritis Paternal Grandmother    Heart disease Paternal Grandmother    Leukemia Father    Heart disease Father    Nephrolithiasis Father    Kidney cancer Son    Appendicitis Son    Heart attack Neg Hx    Colon cancer Neg Hx    Esophageal cancer Neg Hx    Pancreatic cancer Neg Hx    Stomach cancer Neg Hx    Liver disease Neg Hx     Social History   Socioeconomic History   Marital status: Married    Spouse name: Not on file   Number of children: 2   Years of education: Not on file   Highest education level: Not on file  Occupational History   Occupation: TECHNICAL SUP    Employer: [employer]  Tobacco Use   Smoking status: Never   Smokeless tobacco: Never  Vaping Use   Vaping  Use: Never used  Substance and Sexual Activity   Alcohol use: No   Drug use: No   Sexual activity: Yes    Birth control/protection: None  Other Topics Concern   Not on file  Social History Narrative   Non smoker  No ets          Social Determinants of Corporate investment banker Strain: Not on file  Food Insecurity: No Food Insecurity   Worried About Programme researcher, broadcasting/film/video in the Last Year: Never true   Ran Out of Food in the Last Year: Never true  Transportation Needs: No Transportation Needs   Lack of Transportation (Medical): No   Lack of Transportation (Non-Medical): No  Physical Activity: Insufficiently Active   Days of Exercise per Week: 2 days   Minutes of Exercise per Session: 20 min  Stress: No Stress Concern Present   Feeling of Stress : Not at all  Social Connections: Socially Integrated   Frequency of Communication with Friends and Family: Three times a week   Frequency of Social  Gatherings with Friends and Family: Three times a week   Attends Religious Services: More than 4 times per year   Active Member of Clubs or Organizations: Yes   Attends Engineer, structural: More than 4 times per year   Marital Status: Married  Catering manager Violence: Not At Risk   Fear of Current or Ex-Partner: No   Emotionally Abused: No   Physically Abused: No   Sexually Abused: No    Outpatient Medications Prior to Visit  Medication Sig Dispense Refill   ALPRAZolam (XANAX) 0.25 MG tablet TAKE 1 TABLET BY MOUTH AT BEDTIME AS NEEDED FOR ANXIETY OR SLEEP. 30 tablet 5   Cholecalciferol (VITAMIN D3) 1000 UNITS CAPS Take 2,000 Units by mouth every morning.     diltiazem (CARDIZEM CD) 240 MG 24 hr capsule TAKE 1 CAPSULE BY MOUTH EVERY MORNING 90 capsule 3   flecainide (TAMBOCOR) 100 MG tablet TAKE 1 TABLET TWICE DAILY 180 tablet 2   losartan (COZAAR) 25 MG tablet TAKE 1 TABLET EVERY DAY 90 tablet 3   metoprolol succinate (TOPROL-XL) 25 MG 24 hr tablet TAKE 1 TABLET EVERY DAY WITH OR IMMEDIATELY FOLLOWING A MEAL 90 tablet 3   Multiple Vitamin (MULTIVITAMIN) tablet Take 1 tablet by mouth daily.     potassium citrate (UROCIT-K) 10 MEQ (1080 MG) SR tablet TAKE 1 TABLET THREE TIMES DAILY 270 tablet 3   rosuvastatin (CRESTOR) 20 MG tablet TAKE 1 TABLET EVERY DAY (NEW DOSE) 90 tablet 3   sertraline (ZOLOFT) 50 MG tablet TAKE 1 TABLET BY MOUTH EVERY DAY 90 tablet 0   warfarin (COUMADIN) 5 MG tablet TAKE 1 TO 1 AND 1/2 TABLETS DAILY AS DIRECTED BY COUMADIN CLINIC 135 tablet 1   neomycin-polymyxin-hydrocortisone (CORTISPORIN) OTIC solution Place 4 drops into the right ear 4 (four) times daily. 10 mL 0   No facility-administered medications prior to visit.    Allergies  Allergen Reactions   Chocolate Palpitations   Chocolate Flavor Shortness Of Breath    Other reaction(s): Respiratory Distress   Dextroamphetamine Shortness Of Breath    Other reaction(s): Respiratory Distress    Dextromethorphan Palpitations   Flomax [Tamsulosin Hcl] Other (See Comments)    ? Causes A Fib   Floxin [Ofloxacin] Other (See Comments)    hallucinations   Guaifenesin Shortness Of Breath   Lisinopril Cough   Tavist Allergy [Clemastine Fumarate] Other (See Comments)    A fib  worse   Clemastine     Other reaction(s): Irregular Heart Rate   Robitussin Cold+Flu Daytime  [Dm-Phenylephrine-Acetaminophen]     Other reaction(s): Respiratory Distress   Tamsulosin Other (See Comments)    Other reaction(s): Irregular Heart Rate   Amiodarone Other (See Comments)    Abn  thyroid   Robitussin Cf [Pseudoephedrine-Dm-Gg] Palpitations    ROS Review of Systems  Constitutional:  Negative for chills and fever.  HENT:  Positive for ear pain. Negative for congestion, ear discharge, hearing loss and sore throat.      Objective:    Physical Exam Vitals reviewed.  Constitutional:      Appearance: Normal appearance.  HENT:     Ears:     Comments: Only very minimal cerumen right canal.  Essentially none left canal.  He has some very mild erythema inferior portion of the right external canal.  No exudate.  Eardrums appear normal bilaterally Cardiovascular:     Rate and Rhythm: Normal rate.  Neurological:     Mental Status: He is alert.    BP 120/76 (BP Location: Left Arm, Patient Position: Sitting, Cuff Size: Normal)   Pulse (!) 55   Temp 97.9 F (36.6 C) (Oral)   Ht 6\' 2"  (1.88 m)   Wt 202 lb 9.6 oz (91.9 kg)   SpO2 96%   BMI 26.01 kg/m  Wt Readings from Last 3 Encounters:  12/05/21 202 lb 9.6 oz (91.9 kg)  06/20/21 200 lb 1.6 oz (90.8 kg)  05/29/21 196 lb (88.9 kg)     Health Maintenance Due  Topic Date Due   Zoster Vaccines- Shingrix (2 of 2) 08/14/2021   COLONOSCOPY (Pts 45-27yrs Insurance coverage will need to be confirmed)  11/06/2021   TETANUS/TDAP  11/26/2021    There are no preventive care reminders to display for this patient.  Lab Results  Component Value Date    TSH 4.07 08/06/2013   Lab Results  Component Value Date   WBC 7.5 11/21/2019   HGB 14.4 11/21/2019   HCT 45.6 11/21/2019   MCV 90.5 11/21/2019   PLT 174 11/21/2019   Lab Results  Component Value Date   NA 141 05/17/2021   K 4.8 05/17/2021   CO2 22 05/17/2021   GLUCOSE 107 (H) 05/17/2021   BUN 27 05/17/2021   CREATININE 1.07 05/17/2021   BILITOT 0.6 05/17/2021   ALKPHOS 82 05/17/2021   AST 27 05/17/2021   ALT 21 05/17/2021   PROT 6.6 05/17/2021   ALBUMIN 4.4 05/17/2021   CALCIUM 9.1 05/17/2021   ANIONGAP 7 11/21/2019   EGFR 73 05/17/2021   GFR 77.14 07/06/2015   Lab Results  Component Value Date   CHOL 137 05/17/2021   Lab Results  Component Value Date   HDL 45 05/17/2021   Lab Results  Component Value Date   LDLCALC 76 05/17/2021   Lab Results  Component Value Date   TRIG 85 05/17/2021   Lab Results  Component Value Date   CHOLHDL 3.0 05/17/2021   No results found for: HGBA1C    Assessment & Plan:   Problem List Items Addressed This Visit   None Visit Diagnoses     Otalgia of both ears    -  Primary     -He has history of frequent otitis externa.  Only very mild erythema inferior portion of right external canal.  No significant cerumen.  No evidence for otitis media.  Keep ear canals dry.  Send in refill of Cortisporin otic  solution to use 4 drops each ear canal 3 times daily.  Touch base for any persistent or worsening symptoms.  Meds ordered this encounter  Medications   neomycin-polymyxin-hydrocortisone (CORTISPORIN) OTIC solution    Sig: Use 4 drops into both ears three times daily    Dispense:  10 mL    Refill:  0    Follow-up: No follow-ups on file.    Evelena Peat, MD

## 2021-12-19 ENCOUNTER — Other Ambulatory Visit: Payer: Self-pay | Admitting: Family Medicine

## 2021-12-19 MED ORDER — ALPRAZOLAM 0.25 MG PO TABS: ORAL_TABLET | ORAL | 5 refills | Status: DC

## 2021-12-19 NOTE — Telephone Encounter (Signed)
Pt called to ask for Rx Alprazolam (Xanax) 0.25 MG tablet to be sent to [place], [place], [place] [address] since any of the [facility] within a 10 mile radius from where he lives currently have this medication. ? ?Please advise.  ?

## 2021-12-20 DIAGNOSIS — R310 Gross hematuria: Secondary | ICD-10-CM | POA: Diagnosis not present

## 2021-12-20 DIAGNOSIS — N200 Calculus of kidney: Secondary | ICD-10-CM | POA: Diagnosis not present

## 2021-12-20 DIAGNOSIS — N210 Calculus in bladder: Secondary | ICD-10-CM | POA: Diagnosis not present

## 2021-12-20 DIAGNOSIS — N201 Calculus of ureter: Secondary | ICD-10-CM | POA: Diagnosis not present

## 2021-12-20 DIAGNOSIS — K8020 Calculus of gallbladder without cholecystitis without obstruction: Secondary | ICD-10-CM | POA: Diagnosis not present

## 2021-12-29 ENCOUNTER — Encounter: Payer: Self-pay | Admitting: Family Medicine

## 2021-12-29 ENCOUNTER — Telehealth: Payer: Medicare Other | Admitting: Physician Assistant

## 2021-12-29 ENCOUNTER — Telehealth: Payer: Medicare Other | Admitting: Family Medicine

## 2021-12-29 VITALS — BP 142/93 | HR 73 | Ht 74.0 in | Wt 202.0 lb

## 2021-12-29 DIAGNOSIS — B9689 Other specified bacterial agents as the cause of diseases classified elsewhere: Secondary | ICD-10-CM

## 2021-12-29 DIAGNOSIS — R0981 Nasal congestion: Secondary | ICD-10-CM

## 2021-12-29 DIAGNOSIS — J028 Acute pharyngitis due to other specified organisms: Secondary | ICD-10-CM

## 2021-12-29 DIAGNOSIS — R051 Acute cough: Secondary | ICD-10-CM

## 2021-12-29 MED ORDER — AMOXICILLIN 500 MG PO CAPS: 500.0000 mg | ORAL_CAPSULE | Freq: Two times a day (BID) | ORAL | 0 refills | Status: AC

## 2021-12-29 MED ORDER — BENZONATATE 100 MG PO CAPS: ORAL_CAPSULE | ORAL | 0 refills | Status: DC

## 2021-12-29 NOTE — Progress Notes (Signed)
?Virtual Visit Consent  ? ?Francis Pauls., you are scheduled for a virtual visit with a [facility] provider today.   ?  ?Just as with appointments in the office, your consent must be obtained to participate.  Your consent will be active for this visit and any virtual visit you may have with one of our providers in the next 365 days.   ?  ?If you have a MyChart account, a copy of this consent can be sent to you electronically.  All virtual visits are billed to your insurance company just like a traditional visit in the office.   ? ?As this is a virtual visit, video technology does not allow for your provider to perform a traditional examination.  This may limit your provider's ability to fully assess your condition.  If your provider identifies any concerns that need to be evaluated in person or the need to arrange testing (such as labs, EKG, etc.), we will make arrangements to do so.   ?  ?Although advances in technology are sophisticated, we cannot ensure that it will always work on either your end or our end.  If the connection with a video visit is poor, the visit may have to be switched to a telephone visit.  With either a video or telephone visit, we are not always able to ensure that we have a secure connection.    ? ?I need to obtain your verbal consent now.   Are you willing to proceed with your visit today?  ?  ?Francis Francom. has provided verbal consent on 12/29/2021 for a virtual visit (video or telephone). ?  ?Mar Daring, PA-C  ? ?Date: 12/29/2021 11:20 AM ? ? ?Virtual Visit via Video Note  ? ?Francis Campbell, connected with  Francis Campbell.  ([id number], May 13, 1946) on 12/29/21 at 11:45 AM EDT by a video-enabled telemedicine application and verified that I am speaking with the correct person using two identifiers. ? ?Location: ?Patient: Virtual Visit Location Patient: Home ?Provider: Virtual Visit Location Provider: Home Office ?  ?I discussed the limitations of evaluation and  management by telemedicine and the availability of in person appointments. The patient expressed understanding and agreed to proceed.   ? ?History of Present Illness: ?Francis Chlebowski. is a 76 y.o. who identifies as a male who was assigned male at birth, and is being seen today for sore throat. ? ?HPI: Sore Throat  ?This is a new problem. The current episode started in the past 7 days (3 days). There has been no fever. Associated symptoms include congestion, coughing, swollen glands and trouble swallowing. Pertinent negatives include no ear discharge, ear pain or headaches. Treatments tried: dual, Mucinex, cough drops. The treatment provided no relief.   ?At home covid testing is negative. ? ?Problems:  ?Patient Active Problem List  ? Diagnosis Date Noted  ? Irregular bowel habits 11/21/2019  ? SBO (small bowel obstruction) ([place]) 11/20/2019  ? History of kidney stones 04/02/2018  ? Chronic insomnia 04/07/2017  ? Epigastric discomfort 11/23/2014  ? LLQ discomfort 11/23/2014  ? Encounter for therapeutic drug monitoring 10/22/2013  ? BPH associated with nocturia 08/06/2013  ? Atrial flutter ([place]) 11/17/2012  ? Anxiety 10/03/2012  ? Benign hypertensive heart disease without heart failure 11/28/2011  ? Regional enteritis of unspecified site 11/07/2011  ? Duodenitis 11/07/2011  ? Depression, recurrent ([place]) 11/06/2011  ? Abdominal pain 10/22/2011  ? Crohn disease ([place]) 10/22/2011  ? Anticoagulated on Coumadin 10/22/2011  ?  Chest pain radiating to arm 09/14/2011  ? Leucocytosis 09/09/2011  ? Cough, persistent 07/30/2011  ? Paroxysmal atrial fibrillation (HCC)   ? Pure hypercholesterolemia   ? Aortic insufficiency   ? Systolic murmur   ?  ?Allergies:  ?Allergies  ?Allergen Reactions  ? Chocolate Palpitations  ? Chocolate Flavor Shortness Of Breath  ?  Other reaction(s): Respiratory Distress  ? Dextroamphetamine Shortness Of Breath  ?  Other reaction(s): Respiratory Distress  ? Dextromethorphan Palpitations  ? Flomax  [Tamsulosin Hcl] Other (See Comments)  ?  ? Causes A Fib  ? Floxin [Ofloxacin] Other (See Comments)  ?  hallucinations  ? Guaifenesin Shortness Of Breath  ? Lisinopril Cough  ? Tavist Allergy [Clemastine Fumarate] Other (See Comments)  ?  A fib worse  ? Clemastine   ?  Other reaction(s): Irregular Heart Rate  ? Robitussin Cold+Flu Daytime  [Dm-Phenylephrine-Acetaminophen]   ?  Other reaction(s): Respiratory Distress  ? Tamsulosin Other (See Comments)  ?  Other reaction(s): Irregular Heart Rate  ? Amiodarone Other (See Comments)  ?  Abn  thyroid  ? Robitussin Cf [Pseudoephedrine-Dm-Gg] Palpitations  ? ?Medications:  ?Current Outpatient Medications:  ?  amoxicillin (AMOXIL) 500 MG capsule, Take 1 capsule (500 mg total) by mouth 2 (two) times daily for 10 days., Disp: 20 capsule, Rfl: 0 ?  ALPRAZolam (XANAX) 0.25 MG tablet, TAKE 1 TABLET BY MOUTH AT BEDTIME AS NEEDED FOR ANXIETY OR SLEEP., Disp: 30 tablet, Rfl: 5 ?  Cholecalciferol (VITAMIN D3) 1000 UNITS CAPS, Take 2,000 Units by mouth every morning., Disp: , Rfl:  ?  diltiazem (CARDIZEM CD) 240 MG 24 hr capsule, TAKE 1 CAPSULE BY MOUTH EVERY MORNING, Disp: 90 capsule, Rfl: 3 ?  flecainide (TAMBOCOR) 100 MG tablet, TAKE 1 TABLET TWICE DAILY, Disp: 180 tablet, Rfl: 2 ?  losartan (COZAAR) 25 MG tablet, TAKE 1 TABLET EVERY DAY, Disp: 90 tablet, Rfl: 3 ?  metoprolol succinate (TOPROL-XL) 25 MG 24 hr tablet, TAKE 1 TABLET EVERY DAY WITH OR IMMEDIATELY FOLLOWING A MEAL, Disp: 90 tablet, Rfl: 3 ?  Multiple Vitamin (MULTIVITAMIN) tablet, Take 1 tablet by mouth daily., Disp: , Rfl:  ?  neomycin-polymyxin-hydrocortisone (CORTISPORIN) OTIC solution, Use 4 drops into both ears three times daily, Disp: 10 mL, Rfl: 0 ?  potassium citrate (UROCIT-K) 10 MEQ (1080 MG) SR tablet, TAKE 1 TABLET THREE TIMES DAILY, Disp: 270 tablet, Rfl: 3 ?  rosuvastatin (CRESTOR) 20 MG tablet, TAKE 1 TABLET EVERY DAY (NEW DOSE), Disp: 90 tablet, Rfl: 3 ?  sertraline (ZOLOFT) 50 MG tablet, TAKE 1 TABLET  BY MOUTH EVERY DAY, Disp: 90 tablet, Rfl: 0 ?  warfarin (COUMADIN) 5 MG tablet, TAKE 1 TO 1 AND 1/2 TABLETS DAILY AS DIRECTED BY COUMADIN CLINIC, Disp: 135 tablet, Rfl: 1 ? ?Observations/Objective: ?Patient is well-developed, well-nourished in no acute distress.  ?Resting comfortably at home.  ?Head is normocephalic, atraumatic.  ?No labored breathing.  ?Speech is clear and coherent with logical content.  ?Patient is alert and oriented at baseline.  ? ? ?Assessment and Plan: ?1. Bacterial pharyngitis ?- amoxicillin (AMOXIL) 500 MG capsule; Take 1 capsule (500 mg total) by mouth 2 (two) times daily for 10 days.  Dispense: 20 capsule; Refill: 0 ? ?- Suspect bacterial pharyngitis ?- Amoxicillin prescribed ?- Salt water gargles ?- Tylenol ok ?- Push fluids ?- Keep INR check for adjustments after antibiotics ?- Seek in person evaluation if not improving or if symptoms worsen ? ?Follow Up Instructions: ?I discussed the assessment and treatment  plan with the patient. The patient was provided an opportunity to ask questions and all were answered. The patient agreed with the plan and demonstrated an understanding of the instructions.  A copy of instructions were sent to the patient via MyChart unless otherwise noted below.  ? ? ?The patient was advised to call back or seek an in-person evaluation if the symptoms worsen or if the condition fails to improve as anticipated. ? ?Time:  ?I spent 10 minutes with the patient via telehealth technology discussing the above problems/concerns.   ? ?Mar Daring, PA-C ?

## 2021-12-29 NOTE — Progress Notes (Signed)
Virtual Visit via Video Note ? ?I connected with Francis Campbell ? on 12/29/21 at 11:20 AM EDT by a video enabled telemedicine application and verified that I am speaking with the correct person using two identifiers. ? Location patient: [place] ?Location provider:work or home office ?Persons participating in the virtual visit: patient, provider ? ?I discussed the limitations and requested verbal permission for telemedicine visit. The patient expressed understanding and agreed to proceed. ? ? ?HPI: ? ?Acute telemedicine visit for cough and congestion: ?-patient is unsure what is going on - as he was waiting for his visit with me he reports a "nurse called and sent in amoxicillin" ?-he still wanted to do visit with me as was wanting visit with doctor from PCP office ?-when I started visit no note was in the system ?-Onset: 3 days, no symptoms prior ?-Symptoms include: nasal congestion, cough, sore throat ?-Denies: fever, CP, SOB, body aches, NVD ?-no known sick contacts, but has been around folks ?-covid test last night negative ?-Has tried: musinex, cough drops ?-Pertinent past medical history: see below ?-Pertinent medication allergies: ?Allergies  ?Allergen Reactions  ? Chocolate Palpitations  ? Chocolate Flavor Shortness Of Breath  ?  Other reaction(s): Respiratory Distress  ? Dextroamphetamine Shortness Of Breath  ?  Other reaction(s): Respiratory Distress  ? Dextromethorphan Palpitations  ? Flomax [Tamsulosin Hcl] Other (See Comments)  ?  ? Causes A Fib  ? Floxin [Ofloxacin] Other (See Comments)  ?  hallucinations  ? Guaifenesin Shortness Of Breath  ? Lisinopril Cough  ? Tavist Allergy [Clemastine Fumarate] Other (See Comments)  ?  A fib worse  ? Clemastine   ?  Other reaction(s): Irregular Heart Rate  ? Robitussin Cold+Flu Daytime  [Dm-Phenylephrine-Acetaminophen]   ?  Other reaction(s): Respiratory Distress  ? Tamsulosin Other (See Comments)  ?  Other reaction(s): Irregular Heart Rate  ? Amiodarone Other (See Comments)  ?   Abn  thyroid  ? Robitussin Cf [Pseudoephedrine-Dm-Gg] Palpitations  ? ?-COVID-19 vaccine status:  ?Immunization History  ?Administered Date(s) Administered  ? Fluad Quad(high Dose 65+) 05/28/2019, 06/13/2020, 05/29/2021  ? Influenza Split 07/30/2011, 07/06/2013  ? Influenza, High Dose Seasonal PF 05/26/2015, 05/18/2016, 06/18/2017, 07/07/2018  ? Influenza,inj,Quad PF,6+ Mos 06/16/2014  ? PFIZER(Purple Top)SARS-COV-2 Vaccination 10/22/2019, 11/17/2019, 06/14/2020, 12/30/2020  ? Pension scheme manager 11yr & up 06/19/2021  ? Pneumococcal Conjugate-13 09/17/2010, 08/31/2013  ? Pneumococcal Polysaccharide-23 04/08/2015  ? Tdap 11/27/2011  ? Zoster Recombinat (Shingrix) 06/19/2021  ? Zoster, Live 09/28/2010  ? ? ? ?ROS: See pertinent positives and negatives per HPI. ? ?Past Medical History:  ?Diagnosis Date  ? Allergy   ? Aortic regurgitation   ? Arthritis   ? Asthma   ? only as child  ? Atrial flutter ([facility]   ? a. Dx 09/2012.  ? Cataract   ? Esophageal reflux   ? Remotely  ? History of kidney stones   ? Hypercholesteremia   ? Hypertension   ? Paroxysmal atrial fibrillation (HCC)   ? Takes PRN flecainide  ? 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  ? TIA (transient ischemic attack)   ? ~2005 - had visual symptoms, was told by Dr. BMare Ferrarithis may have been a TIA but it's unclear  ? Valvular heart disease   ? Mild AI, ASclerosis without AS by echo 10/2011  ? ? ?Past Surgical History:  ?Procedure Laterality Date  ? CARDIOVERSION N/A 05/08/2013  ? Procedure: CARDIOVERSION;  Surgeon: [name] MD;  Location:  [facility];  Service: Cardiovascular;  Laterality: N/A;  ? CATARACT EXTRACTION    ? Right 12/2019  ? CATARACT EXTRACTION    ? CYSTOSCOPY/RETROGRADE/URETEROSCOPY/STONE EXTRACTION WITH BASKET Right 02/12/2013  ? Procedure: CYSTOSCOPY/RETROGRADE/URETEROSCOPY/STONE EXTRACTION WITH BASKET, WITH  STENT PLACEMENT RIGHT URETERAL DILATION;  Surgeon: [name], MD;  Location:  [facility];  Service: Urology;  Laterality: Right;  ? EYE SURGERY Bilateral 1950, 1960, 1964  ? left, right, left  ? HERNIA REPAIR  2005  ? KNEE SURGERY Bilateral 1990, 2000  ? LITHOTRIPSY    ? ROTATOR CUFF REPAIR Left 2000  ? TONSILLECTOMY    ? ? ? ?Current Outpatient Medications:  ?  benzonatate (TESSALON PERLES) 100 MG capsule, 1-2 capsules up to twice daily as needed for cough, Disp: 30 capsule, Rfl: 0 ?  ALPRAZolam (XANAX) 0.25 MG tablet, TAKE 1 TABLET BY MOUTH AT BEDTIME AS NEEDED FOR ANXIETY OR SLEEP., Disp: 30 tablet, Rfl: 5 ?  amoxicillin (AMOXIL) 500 MG capsule, Take 1 capsule (500 mg total) by mouth 2 (two) times daily for 10 days., Disp: 20 capsule, Rfl: 0 ?  Cholecalciferol (VITAMIN D3) 1000 UNITS CAPS, Take 2,000 Units by mouth every morning., Disp: , Rfl:  ?  diltiazem (CARDIZEM CD) 240 MG 24 hr capsule, TAKE 1 CAPSULE BY MOUTH EVERY MORNING, Disp: 90 capsule, Rfl: 3 ?  flecainide (TAMBOCOR) 100 MG tablet, TAKE 1 TABLET TWICE DAILY, Disp: 180 tablet, Rfl: 2 ?  losartan (COZAAR) 25 MG tablet, TAKE 1 TABLET EVERY DAY, Disp: 90 tablet, Rfl: 3 ?  metoprolol succinate (TOPROL-XL) 25 MG 24 hr tablet, TAKE 1 TABLET EVERY DAY WITH OR IMMEDIATELY FOLLOWING A MEAL, Disp: 90 tablet, Rfl: 3 ?  Multiple Vitamin (MULTIVITAMIN) tablet, Take 1 tablet by mouth daily., Disp: , Rfl:  ?  neomycin-polymyxin-hydrocortisone (CORTISPORIN) OTIC solution, Use 4 drops into both ears three times daily, Disp: 10 mL, Rfl: 0 ?  potassium citrate (UROCIT-K) 10 MEQ (1080 MG) SR tablet, TAKE 1 TABLET THREE TIMES DAILY, Disp: 270 tablet, Rfl: 3 ?  rosuvastatin (CRESTOR) 20 MG tablet, TAKE 1 TABLET EVERY DAY (NEW DOSE), Disp: 90 tablet, Rfl: 3 ?  sertraline (ZOLOFT) 50 MG tablet, TAKE 1 TABLET BY MOUTH EVERY DAY, Disp: 90 tablet, Rfl: 0 ?  warfarin (COUMADIN) 5 MG tablet, TAKE 1 TO 1 AND 1/2 TABLETS DAILY AS DIRECTED BY COUMADIN CLINIC, Disp: 135 tablet, Rfl: 1 ? ?EXAM: ? ?VITALS per patient if applicable: ? ?GENERAL: alert, oriented,  appears well and in no acute distress ? ?HEENT: atraumatic, conjunttiva clear, no obvious abnormalities on inspection of external nose and ears, on video visit inspection of oropharynx - moist mucus membranes, pnd, no tonsillar edema or exudate ? ?NECK: normal movements of the head and neck ? ?LUNGS: on inspection no signs of respiratory distress, breathing rate appears normal, no obvious gross SOB, gasping or wheezing ? ?CV: no obvious cyanosis ? ?MS: moves all visible extremities without noticeable abnormality ? ?PSYCH/NEURO: pleasant and cooperative, no obvious depression or anxiety, speech and thought processing grossly intact ? ?ASSESSMENT AND PLAN: ? ?Discussed the following assessment and plan: ? ?Nasal congestion ? ?Acute cough ? ?-we discussed possible serious and likely etiologies, options for evaluation and workup, limitations of telemedicine visit vs in person visit, treatment, treatment risks and precautions. Pt is agreeable to treatment via telemedicine at this moment. Suspect viral URI vs other. Discussed possibility covid with false neg testing given current cdc recs for multiple tests. Advised of options for obtaining antiviral if  obtains positive test. Discussed risks and appropriate use of abx and opted for nasal saline rinses, tessalon for cough, warm salt water gargles for now.  ?Advised to seek prompt virtual visit or in person care if worsening, new symptoms arise, or if is not improving with treatment as expected per our conversation of expected course. Discussed options for follow up care.  ?Note: after visit do see visit note note and charge for virtual visit with another provider. I am not sure exactly how this happened. Will not charge for this visit and did send note to practice admin.  ?I discussed the assessment and treatment plan with the patient. The patient was provided an opportunity to ask questions and all were answered. The patient agreed with the plan and demonstrated an  understanding of the instructions. ?  ? ? ?Lucretia Kern, DO  ? ?

## 2021-12-29 NOTE — Patient Instructions (Signed)
?  HOME CARE TIPS: ? ?-COVID19 testing information: ?ForwardDrop.tn ? ?Most pharmacies also offer testing and home test kits. If the Covid19 test is positive and you desire antiviral treatment, please contact a [facility] or schedule a follow up virtual visit through your primary care office or through the [facility].  ?Other test to treat options: ?ConnectRV.is?click_source=alert ? ?-I sent the medication(s) we discussed to your pharmacy: ?Meds ordered this encounter  ?Medications  ? benzonatate (TESSALON PERLES) 100 MG capsule  ?  Sig: 1-2 capsules up to twice daily as needed for cough  ?  Dispense:  30 capsule  ?  Refill:  0  ?  ? ?-can use tylenol if needed for fevers, aches and pains per instructions ? ?-nasal saline sinus rinses twice daily ? ?-warm salt water gargles twice daily  ? ?-stay hydrated, drink plenty of fluids and eat small healthy meals - avoid dairy ? ?-follow up with your doctor unless improving as expected ? ?-stay home while sick, except to seek medical care. If you have COVID19, you will likely be contagious for 7-10 days. Flu or Influenza is likely contagious for about 7 days. Other respiratory viral infections remain contagious for 5-10+ days depending on the virus and many other factors. Wear a good mask that fits snugly (such as N95 or KN95) if around others to reduce the risk of transmission. ? ?It was nice to meet you today, and I really hope you are feeling better soon. I help [facility] out with telemedicine visits on Tuesdays and Thursdays and am happy to help if you need a follow up virtual visit on those days. Otherwise, if you have any concerns or questions following this visit please schedule a follow up visit with your Primary Care doctor or seek care at a local urgent care clinic to avoid delays in care.  ? ? ?Seek in person care or schedule a follow up video visit promptly if your symptoms worsen, new concerns  arise or you are not improving with treatment over the next several days. Call 911 and/or seek emergency care if your symptoms are severe or life threatening. ? ? ?

## 2021-12-29 NOTE — Patient Instructions (Signed)
?Francis Pauls., thank you for joining Mar Daring, PA-C for today's virtual visit.  While this provider is not your primary care provider (PCP), if your PCP is located in our provider database this encounter information will be shared with them immediately following your visit. ? ?Consent: ?(Patient) Francis Pauls. provided verbal consent for this virtual visit at the beginning of the encounter. ? ?Current Medications: ? ?Current Outpatient Medications:  ?  amoxicillin (AMOXIL) 500 MG capsule, Take 1 capsule (500 mg total) by mouth 2 (two) times daily for 10 days., Disp: 20 capsule, Rfl: 0 ?  ALPRAZolam (XANAX) 0.25 MG tablet, TAKE 1 TABLET BY MOUTH AT BEDTIME AS NEEDED FOR ANXIETY OR SLEEP., Disp: 30 tablet, Rfl: 5 ?  Cholecalciferol (VITAMIN D3) 1000 UNITS CAPS, Take 2,000 Units by mouth every morning., Disp: , Rfl:  ?  diltiazem (CARDIZEM CD) 240 MG 24 hr capsule, TAKE 1 CAPSULE BY MOUTH EVERY MORNING, Disp: 90 capsule, Rfl: 3 ?  flecainide (TAMBOCOR) 100 MG tablet, TAKE 1 TABLET TWICE DAILY, Disp: 180 tablet, Rfl: 2 ?  losartan (COZAAR) 25 MG tablet, TAKE 1 TABLET EVERY DAY, Disp: 90 tablet, Rfl: 3 ?  metoprolol succinate (TOPROL-XL) 25 MG 24 hr tablet, TAKE 1 TABLET EVERY DAY WITH OR IMMEDIATELY FOLLOWING A MEAL, Disp: 90 tablet, Rfl: 3 ?  Multiple Vitamin (MULTIVITAMIN) tablet, Take 1 tablet by mouth daily., Disp: , Rfl:  ?  neomycin-polymyxin-hydrocortisone (CORTISPORIN) OTIC solution, Use 4 drops into both ears three times daily, Disp: 10 mL, Rfl: 0 ?  potassium citrate (UROCIT-K) 10 MEQ (1080 MG) SR tablet, TAKE 1 TABLET THREE TIMES DAILY, Disp: 270 tablet, Rfl: 3 ?  rosuvastatin (CRESTOR) 20 MG tablet, TAKE 1 TABLET EVERY DAY (NEW DOSE), Disp: 90 tablet, Rfl: 3 ?  sertraline (ZOLOFT) 50 MG tablet, TAKE 1 TABLET BY MOUTH EVERY DAY, Disp: 90 tablet, Rfl: 0 ?  warfarin (COUMADIN) 5 MG tablet, TAKE 1 TO 1 AND 1/2 TABLETS DAILY AS DIRECTED BY COUMADIN CLINIC, Disp: 135 tablet, Rfl: 1   ? ?Medications ordered in this encounter:  ?Meds ordered this encounter  ?Medications  ? amoxicillin (AMOXIL) 500 MG capsule  ?  Sig: Take 1 capsule (500 mg total) by mouth 2 (two) times daily for 10 days.  ?  Dispense:  20 capsule  ?  Refill:  0  ?  Order Specific Question:   Supervising Provider  ?  Answer:   Noemi Chapel [[id number]]  ?  ? ?*If you need refills on other medications prior to your next appointment, please contact your pharmacy* ? ?Follow-Up: ?Call back or seek an in-person evaluation if the symptoms worsen or if the condition fails to improve as anticipated. ? ?Other Instructions ? ?Pharyngitis ? ?Pharyngitis is inflammation of the throat (pharynx). It is a very common cause of sore throat. Pharyngitis can be caused by a bacteria, but it is usually caused by a virus. Most cases of pharyngitis get better on their own without treatment. ?What are the causes? ?This condition may be caused by: ?Infection by viruses (viral). Viral pharyngitis spreads easily from person to person (is contagious) through coughing, sneezing, and sharing of personal items or utensils such as cups, forks, spoons, and toothbrushes. ?Infection by bacteria (bacterial). Bacterial pharyngitis may be spread by touching the nose or face after coming in contact with the bacteria, or through close contact, such as kissing. ?Allergies. Allergies can cause buildup of mucus in the throat (post-nasal drip), leading to inflammation and irritation.  Allergies can also cause blocked nasal passages, forcing breathing through the mouth, which dries and irritates the throat. ?What increases the risk? ?You are more likely to develop this condition if: ?You are 38-28 years old. ?You are exposed to crowded environments such as daycare, school, or dormitory living. ?You live in a cold climate. ?You have a weakened disease-fighting (immune) system. ?What are the signs or symptoms? ?Symptoms of this condition vary by the cause. Common symptoms of this  condition include: ?Sore throat. ?Fatigue. ?Low-grade fever. ?Stuffy nose (nasal congestion) and cough. ?Headache. ?Other symptoms may include: ?Glands in the neck (lymph nodes) that are swollen. ?Skin rashes. ?Plaque-like film on the throat or tonsils. This is often a symptom of bacterial pharyngitis. ?Vomiting. ?Red, itchy eyes (conjunctivitis). ?Loss of appetite. ?Joint pain and muscle aches. ?Enlarged tonsils. ?How is this diagnosed? ?This condition may be diagnosed based on your medical history and a physical exam. Your health care provider will ask you questions about your illness and your symptoms. ?A swab of your throat may be done to check for bacteria (rapid strep test). Other lab tests may also be done, depending on the suspected cause, but these are rare. ?How is this treated? ?Many times, treatment is not needed for this condition. Pharyngitis usually gets better in 3-4 days without treatment. ?Bacterial pharyngitis may be treated with antibiotic medicines. ?Follow these instructions at home: ?Medicines ?Take over-the-counter and prescription medicines only as told by your health care provider. ?If you were prescribed an antibiotic medicine, take it as told by your health care provider. Do not stop taking the antibiotic even if you start to feel better. ?Use throat sprays to soothe your throat as told by your health care provider. ?Children can get pharyngitis. Do not give your child aspirin because of the association with Reye's syndrome. ?Managing pain ?To help with pain, try: ?Sipping warm liquids, such as broth, herbal tea, or warm water. ?Eating or drinking cold or frozen liquids, such as frozen ice pops. ?Gargling with a mixture of salt and water 3-4 times a day or as needed. To make salt water, completely dissolve ?-1 tsp (3-6 g) of salt in 1 cup (237 mL) of warm water. ?Sucking on hard candy or throat lozenges. ?Putting a cool-mist humidifier in your bedroom at night to moisten the air. ?Sitting  in the bathroom with the door closed for 5-10 minutes while you run hot water in the shower. ? ?General instructions ? ?Do not use any products that contain nicotine or tobacco. These products include cigarettes, chewing tobacco, and vaping devices, such as e-cigarettes. If you need help quitting, ask your health care provider. ?Rest as told by your health care provider. ?Drink enough fluid to keep your urine pale yellow. ?How is this prevented? ?To help prevent becoming infected or spreading infection: ?Wash your hands often with soap and water for at least 20 seconds. If soap and water are not available, use hand sanitizer. ?Do not touch your eyes, nose, or mouth with unwashed hands, and wash hands after touching these areas. ?Do not share cups or eating utensils. ?Avoid close contact with people who are sick. ?Contact a health care provider if: ?You have large, tender lumps in your neck. ?You have a rash. ?You cough up green, yellow-brown, or bloody mucus. ?Get help right away if: ?Your neck becomes stiff. ?You drool or are unable to swallow liquids. ?You cannot drink or take medicines without vomiting. ?You have severe pain that does not go away, even after  you take medicine. ?You have trouble breathing, and it is not caused by a stuffy nose. ?You have new pain and swelling in your joints such as the knees, ankles, wrists, or elbows. ?These symptoms may represent a serious problem that is an emergency. Do not wait to see if the symptoms will go away. Get medical help right away. Call your local emergency services (911 in the [place] Do not drive yourself to the hospital. ?Summary ?Pharyngitis is redness, pain, and swelling (inflammation) of the throat (pharynx). ?While pharyngitis can be caused by a bacteria, the most common causes are viral. ?Most cases of pharyngitis get better on their own without treatment. ?Bacterial pharyngitis is treated with antibiotic medicines. ?This information is not intended to  replace advice given to you by your health care provider. Make sure you discuss any questions you have with your health care provider. ?Document Revised: 11/30/2020 Document Reviewed: 11/30/2020 ?[employer]vier Patient Edu

## 2021-12-31 ENCOUNTER — Encounter (HOSPITAL_BASED_OUTPATIENT_CLINIC_OR_DEPARTMENT_OTHER): Payer: Self-pay | Admitting: Cardiovascular Disease

## 2022-01-01 MED ORDER — FLECAINIDE ACETATE 100 MG PO TABS
100.0000 mg | ORAL_TABLET | Freq: Two times a day (BID) | ORAL | 3 refills | Status: DC
  Filled 2022-07-02: qty 180, 90d supply, fill #0
  Filled 2022-09-24: qty 180, 90d supply, fill #1

## 2022-01-09 ENCOUNTER — Encounter: Payer: Self-pay | Admitting: Family Medicine

## 2022-01-10 MED ORDER — BENZONATATE 100 MG PO CAPS
ORAL_CAPSULE | ORAL | 0 refills | Status: DC
Start: 2022-01-10 — End: 2022-02-05

## 2022-01-12 ENCOUNTER — Telehealth (INDEPENDENT_AMBULATORY_CARE_PROVIDER_SITE_OTHER): Payer: Medicare Other | Admitting: Family Medicine

## 2022-01-12 ENCOUNTER — Encounter: Payer: Self-pay | Admitting: Family Medicine

## 2022-01-12 ENCOUNTER — Ambulatory Visit (INDEPENDENT_AMBULATORY_CARE_PROVIDER_SITE_OTHER): Payer: Medicare Other

## 2022-01-12 DIAGNOSIS — Z5181 Encounter for therapeutic drug level monitoring: Secondary | ICD-10-CM

## 2022-01-12 DIAGNOSIS — I480 Paroxysmal atrial fibrillation: Secondary | ICD-10-CM

## 2022-01-12 DIAGNOSIS — R0982 Postnasal drip: Secondary | ICD-10-CM | POA: Diagnosis not present

## 2022-01-12 DIAGNOSIS — J029 Acute pharyngitis, unspecified: Secondary | ICD-10-CM | POA: Diagnosis not present

## 2022-01-12 DIAGNOSIS — R052 Subacute cough: Secondary | ICD-10-CM | POA: Diagnosis not present

## 2022-01-12 LAB — POCT INR: INR: 3 (ref 2.0–3.0)

## 2022-01-12 MED ORDER — FEXOFENADINE HCL 180 MG PO TABS: 180.0000 mg | ORAL_TABLET | Freq: Every day | ORAL | 0 refills | Status: DC

## 2022-01-12 NOTE — Patient Instructions (Signed)
Continue taking 23m daily except 7.537mevery Monday, Wednesday and Friday. Recheck INR in 6 weeks. [facility] [phone]

## 2022-01-12 NOTE — Progress Notes (Signed)
Virtual Visit via Video Note ? ?I connected with Francis Campbell on 01/12/22 at 11:45 AM EDT by a video enabled telemedicine application 2/2 [id number] pandemic and verified that I am speaking with the correct person using two identifiers. ? Location patient: home ?Location provider:work or home office ?Persons participating in the virtual visit: patient, provider ? ?I discussed the limitations of evaluation and management by telemedicine and the availability of in person appointments. The patient expressed understanding and agreed to proceed. ?Chief Complaint  ?Patient presents with  ? Cough  ?  Going on 2-3 weeks, dry cough as nothing has come up. States it sounds loose. On second dose of tessalon and also taking mucinex, has not made it worse, just seems to be lingering on.   ? ? ? ?HPI: ?Pt with cough and sore throat x 3 wks.  Seen virtually 12/29/21.  Felt slightly better then came back.  Also with some rhinorrhea, and ear pain/ pressure.   ?Denies fever, chills, HA, facial pain/pressure, diarrhea, n/v. ?Pt tried tessalon, mucinex, flonase for symptoms.  Flonase seems to help. ? ?ROS: See pertinent positives and negatives per HPI. ? ?Past Medical History:  ?Diagnosis Date  ? Allergy   ? Aortic regurgitation   ? Arthritis   ? Asthma   ? only as child  ? Atrial flutter ([place])   ? a. Dx 09/2012.  ? Cataract   ? Esophageal reflux   ? Remotely  ? History of kidney stones   ? Hypercholesteremia   ? Hypertension   ? Paroxysmal atrial fibrillation (HCC)   ? Takes PRN flecainide  ? 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  ? TIA (transient ischemic attack)   ? ~2005 - had visual symptoms, was told by [name] this may have been a TIA but it's unclear  ? Valvular heart disease   ? Mild AI, ASclerosis without AS by echo 10/2011  ? ? ?Past Surgical History:  ?Procedure Laterality Date  ? CARDIOVERSION N/A 05/08/2013  ? Procedure: CARDIOVERSION;  Surgeon: Darlin Coco, MD;  Location: [facility];   Service: Cardiovascular;  Laterality: N/A;  ? CATARACT EXTRACTION    ? Right 12/2019  ? CATARACT EXTRACTION    ? CYSTOSCOPY/RETROGRADE/URETEROSCOPY/STONE EXTRACTION WITH BASKET Right 02/12/2013  ? Procedure: CYSTOSCOPY/RETROGRADE/URETEROSCOPY/STONE EXTRACTION WITH BASKET, WITH  STENT PLACEMENT RIGHT URETERAL DILATION;  Surgeon: [name], MD;  Location: [facility];  Service: Urology;  Laterality: Right;  ? EYE SURGERY Bilateral 1950, 1960, 1964  ? left, right, left  ? HERNIA REPAIR  2005  ? KNEE SURGERY Bilateral 1990, 2000  ? LITHOTRIPSY    ? ROTATOR CUFF REPAIR Left 2000  ? TONSILLECTOMY    ? ? ?Family History  ?Problem Relation Age of Onset  ? Stroke Mother   ? Hypertension Mother   ? Kidney disease Mother   ?     stones  ? Nephrolithiasis Brother   ? Arthritis Paternal Grandmother   ? Heart disease Paternal Grandmother   ? Leukemia Father   ? Heart disease Father   ? Nephrolithiasis Father   ? Kidney cancer Son   ? Appendicitis Son   ? Heart attack Neg Hx   ? Colon cancer Neg Hx   ? Esophageal cancer Neg Hx   ? Pancreatic cancer Neg Hx   ? Stomach cancer Neg Hx   ? Liver disease Neg Hx   ? ? ?Current Outpatient Medications:  ?  ALPRAZolam (XANAX) 0.25 MG tablet, TAKE  1 TABLET BY MOUTH AT BEDTIME AS NEEDED FOR ANXIETY OR SLEEP., Disp: 30 tablet, Rfl: 5 ?  benzonatate (TESSALON PERLES) 100 MG capsule, 1-2 capsules up to twice daily as needed for cough, Disp: 30 capsule, Rfl: 0 ?  Cholecalciferol (VITAMIN D3) 1000 UNITS CAPS, Take 2,000 Units by mouth every morning., Disp: , Rfl:  ?  diltiazem (CARDIZEM CD) 240 MG 24 hr capsule, TAKE 1 CAPSULE BY MOUTH EVERY MORNING, Disp: 90 capsule, Rfl: 3 ?  flecainide (TAMBOCOR) 100 MG tablet, Take 1 tablet (100 mg total) by mouth 2 (two) times daily., Disp: 180 tablet, Rfl: 3 ?  losartan (COZAAR) 25 MG tablet, TAKE 1 TABLET EVERY DAY, Disp: 90 tablet, Rfl: 3 ?  metoprolol succinate (TOPROL-XL) 25 MG 24 hr tablet, TAKE 1 TABLET EVERY DAY WITH OR IMMEDIATELY FOLLOWING A MEAL, Disp:  90 tablet, Rfl: 3 ?  Multiple Vitamin (MULTIVITAMIN) tablet, Take 1 tablet by mouth daily., Disp: , Rfl:  ?  neomycin-polymyxin-hydrocortisone (CORTISPORIN) OTIC solution, Use 4 drops into both ears three times daily, Disp: 10 mL, Rfl: 0 ?  potassium citrate (UROCIT-K) 10 MEQ (1080 MG) SR tablet, TAKE 1 TABLET THREE TIMES DAILY, Disp: 270 tablet, Rfl: 3 ?  rosuvastatin (CRESTOR) 20 MG tablet, TAKE 1 TABLET EVERY DAY (NEW DOSE), Disp: 90 tablet, Rfl: 3 ?  sertraline (ZOLOFT) 50 MG tablet, TAKE 1 TABLET BY MOUTH EVERY DAY, Disp: 90 tablet, Rfl: 0 ?  warfarin (COUMADIN) 5 MG tablet, TAKE 1 TO 1 AND 1/2 TABLETS DAILY AS DIRECTED BY COUMADIN CLINIC, Disp: 135 tablet, Rfl: 1 ? ?EXAM: ? ?VITALS per patient if applicable:  RR between 12-20 bpm ? ?GENERAL: alert, oriented, appears well and in no acute distress ? ?HEENT: atraumatic, conjunctiva clear, no obvious abnormalities on inspection of external nose and ears ? ?NECK: normal movements of the head and neck ? ?LUNGS: on inspection no signs of respiratory distress, breathing rate appears normal, no obvious gross SOB, gasping or wheezing ? ?CV: no obvious cyanosis ? ?MS: moves all visible extremities without noticeable abnormality ? ?PSYCH/NEURO: pleasant and cooperative, no obvious depression or anxiety, speech and thought processing grossly intact ? ?ASSESSMENT AND PLAN: ? ?Discussed the following assessment and plan: ? ?Post-nasal drainage  ?- Plan: fexofenadine (ALLEGRA) 180 MG tablet ? ?Subacute cough  ?- Plan: fexofenadine (ALLEGRA) 180 MG tablet ? ?Sore throat ? ?Advised symptoms likely 2/2 allergic rhinitis causing post nasal drainage, throat irritation and cough.  Continue expectant management of symptoms with OTC meds including Flonase, Mucinex..  Will start [place].  Rx sent to pharmacy.  Okay to continue Tessalon as needed.  Continue worsening symptoms needed.  Given precautions. ?  ?I discussed the assessment and treatment plan with the patient. The patient was  provided an opportunity to ask questions and all were answered. The patient agreed with the plan and demonstrated an understanding of the instructions. ?  ?The patient was advised to call back or seek an in-person evaluation if the symptoms worsen or if the condition fails to improve as anticipated. ? ?Visit started via video.  Last 3 minutes of visit completed via phone as patient's audio and video when out. ? ?Billie Ruddy, MD  ? ?

## 2022-01-16 ENCOUNTER — Encounter: Payer: Self-pay | Admitting: Family Medicine

## 2022-01-16 ENCOUNTER — Ambulatory Visit: Payer: Medicare Other | Admitting: Family Medicine

## 2022-01-16 ENCOUNTER — Ambulatory Visit (INDEPENDENT_AMBULATORY_CARE_PROVIDER_SITE_OTHER): Payer: Medicare Other | Admitting: Family Medicine

## 2022-01-16 VITALS — BP 122/60 | HR 68 | Temp 97.5°F | Ht 74.0 in | Wt 195.3 lb

## 2022-01-16 DIAGNOSIS — H109 Unspecified conjunctivitis: Secondary | ICD-10-CM | POA: Diagnosis not present

## 2022-01-16 DIAGNOSIS — R052 Subacute cough: Secondary | ICD-10-CM

## 2022-01-16 DIAGNOSIS — H6690 Otitis media, unspecified, unspecified ear: Secondary | ICD-10-CM | POA: Diagnosis not present

## 2022-01-16 DIAGNOSIS — J019 Acute sinusitis, unspecified: Secondary | ICD-10-CM | POA: Diagnosis not present

## 2022-01-16 DIAGNOSIS — B9689 Other specified bacterial agents as the cause of diseases classified elsewhere: Secondary | ICD-10-CM | POA: Diagnosis not present

## 2022-01-16 MED ORDER — METHYLPREDNISOLONE ACETATE 80 MG/ML IJ SUSP
80.0000 mg | Freq: Once | INTRAMUSCULAR | Status: AC
  Administered 2022-01-16: 80 mg via INTRAMUSCULAR

## 2022-01-16 MED ORDER — AMOXICILLIN-POT CLAVULANATE 875-125 MG PO TABS: 1.0000 | ORAL_TABLET | Freq: Two times a day (BID) | ORAL | 0 refills | Status: DC

## 2022-01-16 MED ORDER — POLYMYXIN B-TRIMETHOPRIM 10000-0.1 UNIT/ML-% OP SOLN: 2.0000 [drp] | OPHTHALMIC | 0 refills | Status: DC

## 2022-01-16 NOTE — Progress Notes (Signed)
? ?Established Patient Office Visit ? ?Subjective   ?Patient ID: Francis Campbell., male    DOB: 21-Feb-1946  Age: 76 y.o. MRN: [id number] ? ?Chief Complaint  ?Patient presents with  ? URI  ? Cough  ?  Patient complains of cough, x3 weeks, Non-productive, Tried Mucinex and Tessalon pearls with little relief  ? ? ?HPI ? ? ?Seen with at least 3-week history of cough.  He states around April night developed sore throat and some nasal congestion followed by dry cough.  He had some decreased hearing of both ears since then.  Has had some persistent nasal congestion.  Has tried multiple things including Mucinex, Allegra, and Flonase without much improvement.  Nasal discharge mostly clear.  No fever.  No chills.  No dyspnea.  Also relates bilateral eye drainage and congestion.  He has crusting especially early mornings.  No blurred vision.  Wife has had similar symptoms. ? ?Past Medical History:  ?Diagnosis Date  ? Allergy   ? Aortic regurgitation   ? Arthritis   ? Asthma   ? only as child  ? Atrial flutter ([place])   ? a. Dx 09/2012.  ? Cataract   ? Esophageal reflux   ? Remotely  ? History of kidney stones   ? Hypercholesteremia   ? Hypertension   ? Paroxysmal atrial fibrillation (HCC)   ? Takes PRN flecainide  ? 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  ? TIA (transient ischemic attack)   ? ~2005 - had visual symptoms, was told by [name] this may have been a TIA but it's unclear  ? Valvular heart disease   ? Mild AI, ASclerosis without AS by echo 10/2011  ? ?Past Surgical History:  ?Procedure Laterality Date  ? CARDIOVERSION N/A 05/08/2013  ? Procedure: CARDIOVERSION;  Surgeon: Darlin Coco, MD;  Location: [facility];  Service: Cardiovascular;  Laterality: N/A;  ? CATARACT EXTRACTION    ? Right 12/2019  ? CATARACT EXTRACTION    ? CYSTOSCOPY/RETROGRADE/URETEROSCOPY/STONE EXTRACTION WITH BASKET Right 02/12/2013  ? Procedure: CYSTOSCOPY/RETROGRADE/URETEROSCOPY/STONE EXTRACTION WITH BASKET, WITH   STENT PLACEMENT RIGHT URETERAL DILATION;  Surgeon: [name], MD;  Location[facility];  Service: Urology;  Laterality: Right;  ? EYE SURGERY Bilateral 1950, 1960, 1964  ? left, right, left  ? HERNIA REPAIR  2005  ? KNEE SURGERY Bilateral 1990, 2000  ? LITHOTRIPSY    ? ROTATOR CUFF REPAIR Left 2000  ? TONSILLECTOMY    ? ? reports that he has never smoked. He has never used smokeless tobacco. He reports that he does not drink alcohol and does not use drugs. ?family history includes Appendicitis in his son; Arthritis in his paternal grandmother; Heart disease in his father and paternal grandmother; Hypertension in his mother; Kidney cancer in his son; Kidney disease in his mother; Leukemia in his father; Nephrolithiasis in his brother and father; Stroke in his mother. ?Allergies  ?Allergen Reactions  ? Chocolate Palpitations  ? Chocolate Flavor Shortness Of Breath  ?  Other reaction(s): Respiratory Distress  ? Dextroamphetamine Shortness Of Breath  ?  Other reaction(s): Respiratory Distress  ? Dextromethorphan Palpitations  ? Flomax [Tamsulosin Hcl] Other (See Comments)  ?  ? Causes A Fib  ? Floxin [Ofloxacin] Other (See Comments)  ?  hallucinations  ? Guaifenesin Shortness Of Breath  ? Lisinopril Cough  ? Tavist Allergy [Clemastine Fumarate] Other (See Comments)  ?  A fib worse  ? Clemastine   ?  Other reaction(s): Irregular Heart Rate  ? Robitussin Cold+Flu Daytime  [Dm-Phenylephrine-Acetaminophen]   ?  Other reaction(s): Respiratory Distress  ? Tamsulosin Other (See Comments)  ?  Other reaction(s): Irregular Heart Rate  ? Amiodarone Other (See Comments)  ?  Abn  thyroid  ? Robitussin Cf [Pseudoephedrine-Dm-Gg] Palpitations  ? ? ?Review of Systems  ?Constitutional:  Negative for chills and fever.  ?HENT:  Positive for congestion.   ?Eyes:  Positive for discharge and redness. Negative for blurred vision and double vision.  ?Respiratory:  Positive for cough. Negative for shortness of breath and wheezing.    ?Cardiovascular:  Negative for chest pain.  ? ?  ?Objective:  ?  ? ?BP 122/60 (BP Location: Left Arm, Patient Position: Sitting, Cuff Size: Normal)   Pulse 68   Temp (!) 97.5 ?F (36.4 ?C) (Oral)   Ht 6' 2"  (1.88 m)   Wt 195 lb 4.8 oz (88.6 kg)   SpO2 96%   BMI 25.08 kg/m?  ? ? ?Physical Exam ?Vitals reviewed.  ?HENT:  ?   Ears:  ?   Comments: Right eardrum is gray.  Left is erythematous with distorted landmarks.  Slight bulging near the superior aspect.  No visible perforation. ?Eyes:  ?   Pupils: Pupils are equal, round, and reactive to light.  ?   Comments: Conjunctive are slightly red bilaterally.  He does have some thick crusted drainage around both eyes  ?Cardiovascular:  ?   Rate and Rhythm: Normal rate and regular rhythm.  ?Pulmonary:  ?   Effort: Pulmonary effort is normal.  ?   Breath sounds: Normal breath sounds. No wheezing or rales.  ?Musculoskeletal:  ?   Cervical back: Neck supple.  ?Lymphadenopathy:  ?   Cervical: No cervical adenopathy.  ?Neurological:  ?   Mental Status: He is alert.  ? ? ? ?No results found for any visits on 01/16/22. ? ? ? ?The 10-year ASCVD risk score (Arnett DK, et al., 2019) is: 22.7% ? ?  ?Assessment & Plan:  ? ?Problem List Items Addressed This Visit   ?None ?Visit Diagnoses   ? ? Acute non-recurrent sinusitis, unspecified location    -  Primary  ? Relevant Medications  ? amoxicillin-clavulanate (AUGMENTIN) 875-125 MG tablet  ? methylPREDNISolone acetate (DEPO-MEDROL) injection 80 mg (Completed)  ? Subacute cough      ? Relevant Medications  ? methylPREDNISolone acetate (DEPO-MEDROL) injection 80 mg (Completed)  ? Bacterial conjunctivitis of both eyes      ? ?  ?Patient presents with approximately 3-week history of upper respiratory symptoms.  Has evidence for likely bacterial conjunctivitis of both eyes.  Also evidence for left otitis media ? ?-Polytrim ophthalmic drops 2 drops each eye every 4 hours while awake ?-Augmentin 875 mg twice daily with food for 10  days ?-Depo-Medrol 80 mg IM given.  We decided to give the steroids because of his persistent sinusitis symptoms as well as cannot rule out component of severe allergies not responding to nasal steroids or antihistamines. ? ?Return in about 2 weeks (around 01/30/2022).  ? ? ?[name], MD ? ?

## 2022-01-22 ENCOUNTER — Encounter: Payer: Self-pay | Admitting: Family Medicine

## 2022-02-04 ENCOUNTER — Encounter: Payer: Self-pay | Admitting: Family Medicine

## 2022-02-05 MED ORDER — BENZONATATE 100 MG PO CAPS: ORAL_CAPSULE | ORAL | 0 refills | Status: DC

## 2022-02-06 DIAGNOSIS — H6983 Other specified disorders of Eustachian tube, bilateral: Secondary | ICD-10-CM | POA: Diagnosis not present

## 2022-02-14 ENCOUNTER — Other Ambulatory Visit: Payer: Self-pay | Admitting: Cardiovascular Disease

## 2022-02-14 NOTE — Telephone Encounter (Signed)
Rx(s) sent to pharmacy electronically.  

## 2022-02-22 ENCOUNTER — Encounter: Payer: Self-pay | Admitting: Family Medicine

## 2022-02-23 ENCOUNTER — Ambulatory Visit (INDEPENDENT_AMBULATORY_CARE_PROVIDER_SITE_OTHER): Payer: Medicare Other | Admitting: *Deleted

## 2022-02-23 DIAGNOSIS — I480 Paroxysmal atrial fibrillation: Secondary | ICD-10-CM

## 2022-02-23 DIAGNOSIS — Z5181 Encounter for therapeutic drug level monitoring: Secondary | ICD-10-CM

## 2022-02-23 LAB — POCT INR: INR: 4 — AB (ref 2.0–3.0)

## 2022-02-23 NOTE — Patient Instructions (Signed)
Description   Do not take any Warfarin today then start taking 65m daily except 7.557mevery Monday and Friday. Recheck INR in 4 weeks. [facility] [phone]

## 2022-02-26 ENCOUNTER — Telehealth: Payer: Self-pay | Admitting: Cardiovascular Disease

## 2022-02-26 NOTE — Telephone Encounter (Signed)
Pt c/o BP issue: STAT if pt c/o blurred vision, one-sided weakness or slurred speech  1. What are your last 5 BP readings?  6/11 - 152/101, HR 77 164/90, HR 80 162/91, HR 78 154/97, HR 71 148/99, HR 98  2. Are you having any other symptoms (ex. Dizziness, headache, blurred vision, passed out)? Lightheadedness  3. What is your BP issue?  Hypertension and tachycardia.  Patient states he's not dizzy but when he tries to turn around he gets lightheaded.  Patient has appt scheduled on 6/15 w/Caitlin Walker regarding this.

## 2022-02-26 NOTE — Telephone Encounter (Signed)
NP recommendations provided in mychart message

## 2022-02-26 NOTE — Telephone Encounter (Signed)
Dr. Oval Linsey patient, has upcoming appointment with you on 6/15, would you like to change anything before then?

## 2022-02-26 NOTE — Telephone Encounter (Signed)
No hypotension (low blood pressure) that would cause low blood pressure readings. Heart rate routinely less than 100bpm which is good. Ensure staying well hydrated, making position changes slowly. Follow up as scheduled.   Loel Dubonnet, NP

## 2022-03-01 ENCOUNTER — Encounter (HOSPITAL_BASED_OUTPATIENT_CLINIC_OR_DEPARTMENT_OTHER): Payer: Self-pay | Admitting: Family

## 2022-03-01 ENCOUNTER — Ambulatory Visit (INDEPENDENT_AMBULATORY_CARE_PROVIDER_SITE_OTHER): Payer: Medicare Other | Admitting: Family

## 2022-03-01 VITALS — BP 128/62 | HR 57 | Ht 74.0 in | Wt 196.0 lb

## 2022-03-01 DIAGNOSIS — D6859 Other primary thrombophilia: Secondary | ICD-10-CM | POA: Diagnosis not present

## 2022-03-01 DIAGNOSIS — I480 Paroxysmal atrial fibrillation: Secondary | ICD-10-CM

## 2022-03-01 DIAGNOSIS — E782 Mixed hyperlipidemia: Secondary | ICD-10-CM | POA: Diagnosis not present

## 2022-03-01 DIAGNOSIS — I10 Essential (primary) hypertension: Secondary | ICD-10-CM | POA: Diagnosis not present

## 2022-03-01 NOTE — Progress Notes (Unsigned)
Office Visit    Patient Name: Francis Campbell. Date of Encounter: 03/01/2022  PCP:  [name], MD   [facility]  Cardiologist:  Skeet Latch, MD  Advanced Practice Provider:  No care team member to display Electrophysiologist:  None      Chief Complaint    Francis Campbell. is a 76 y.o. male with a hx of *** presents today for ***   Past Medical History    Past Medical History:  Diagnosis Date   Allergy    Aortic regurgitation    Arthritis    Asthma    only as child   Atrial flutter ([place])    a. Dx 09/2012.   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   TIA (transient ischemic attack)    ~2005 - had visual symptoms, was told by [name] this may have been a TIA but it's unclear   Valvular heart disease    Mild AI, ASclerosis without AS by echo 10/2011   Past Surgical History:  Procedure Laterality Date   CARDIOVERSION N/A 05/08/2013   Procedure: CARDIOVERSION;  Surgeon: Darlin Coco, MD;  Location: [facility];  Service: Cardiovascular;  Laterality: N/A;   CATARACT EXTRACTION     Right 12/2019   CATARACT EXTRACTION     CYSTOSCOPY/RETROGRADE/URETEROSCOPY/STONE EXTRACTION WITH BASKET Right 02/12/2013   Procedure: CYSTOSCOPY/RETROGRADE/URETEROSCOPY/STONE EXTRACTION WITH BASKET, WITH  STENT PLACEMENT RIGHT URETERAL DILATION;  Surgeon: [name], MD;  Location[facility];  Service: Urology;  Laterality: Right;   EYE SURGERY Bilateral 1950, 1960, 1964   left, right, left   HERNIA REPAIR  2005   KNEE SURGERY Bilateral 1990, 2000   LITHOTRIPSY     ROTATOR CUFF REPAIR Left 2000   TONSILLECTOMY      Allergies  Allergies  Allergen Reactions   Chocolate Palpitations   Chocolate Flavor Shortness Of Breath    Other  reaction(s): Respiratory Distress   Dextroamphetamine Shortness Of Breath    Other reaction(s): Respiratory Distress   Dextromethorphan Palpitations   Flomax [Tamsulosin Hcl] Other (See Comments)    ? Causes A Fib   Floxin [Ofloxacin] Other (See Comments)    hallucinations   Guaifenesin Shortness Of Breath   Lisinopril Cough   Tavist Allergy [Clemastine Fumarate] Other (See Comments)    A fib worse   Clemastine     Other reaction(s): Irregular Heart Rate   Robitussin Cold+Flu Daytime  [Dm-Phenylephrine-Acetaminophen]     Other reaction(s): Respiratory Distress   Tamsulosin Other (See Comments)    Other reaction(s): Irregular Heart Rate   Amiodarone Other (See Comments)    Abn  thyroid   Robitussin Cf [Pseudoephedrine-Dm-Gg] Palpitations    History of Present Illness    Francis Campbell. is a 76 y.o. male with a hx of *** last seen ***.  Wife amster gardeneer  170/105 also had heartburn can kick into atrial fibrillation - lightheaded   2-3 das ago suddenly felt better.       EKGs/Labs/Other Studies Reviewed:   The following studies were reviewed today: ***  EKG:  EKG is ordered today.  The ekg ordered today demonstrates SB 57 bpm with no acute ST/T wave changes.  Recent Labs: 05/17/2021: ALT 21; BUN 27; Creatinine, Ser 1.07; Magnesium 2.3; Potassium 4.8; Sodium 141  Recent Lipid Panel    Component Value Date/Time   CHOL 137 05/17/2021 0835   TRIG 85 05/17/2021 0835   HDL 45 05/17/2021 0835   CHOLHDL 3.0 05/17/2021 0835   CHOLHDL 5 10/07/2017 1510   VLDL 58.4 (H) 10/07/2017 1510   LDLCALC 76 05/17/2021 0835   LDLDIRECT 96.0 10/07/2017 1510    Risk Assessment/Calculations:  {Does this patient have ATRIAL FIBRILLATION?:[phone]}  Home Medications   Current Meds  Medication Sig   ALPRAZolam (XANAX) 0.25 MG tablet TAKE 1 TABLET BY MOUTH AT BEDTIME AS NEEDED FOR ANXIETY OR SLEEP.   Cholecalciferol (VITAMIN D3) 1000 UNITS CAPS Take 2,000 Units by mouth  every morning.   diltiazem (CARDIZEM CD) 240 MG 24 hr capsule TAKE 1 CAPSULE BY MOUTH EVERY MORNING   flecainide (TAMBOCOR) 100 MG tablet Take 1 tablet (100 mg total) by mouth 2 (two) times daily.   losartan (COZAAR) 25 MG tablet TAKE 1 TABLET EVERY DAY   metoprolol succinate (TOPROL-XL) 25 MG 24 hr tablet TAKE 1 TABLET EVERY DAY WITH OR IMMEDIATELY FOLLOWING A MEAL   Multiple Vitamin (MULTIVITAMIN) tablet Take 1 tablet by mouth daily.   neomycin-polymyxin-hydrocortisone (CORTISPORIN) OTIC solution Use 4 drops into both ears three times daily   potassium citrate (UROCIT-K) 10 MEQ (1080 MG) SR tablet TAKE 1 TABLET THREE TIMES DAILY   rosuvastatin (CRESTOR) 20 MG tablet TAKE 1 TABLET EVERY DAY (NEW DOSE)   sertraline (ZOLOFT) 50 MG tablet TAKE 1 TABLET BY MOUTH EVERY DAY   warfarin (COUMADIN) 5 MG tablet TAKE 1 TO 1 AND 1/2 TABLETS DAILY AS DIRECTED BY COUMADIN CLINIC     Review of Systems   ***   All other systems reviewed and are otherwise negative except as noted above.  Physical Exam    VS:  BP 128/62   Pulse (!) 57   Ht 6' 2"  (1.88 m)   Wt 196 lb (88.9 kg)   BMI 25.16 kg/m  , BMI Body mass index is 25.16 kg/m.  Wt Readings from Last 3 Encounters:  03/01/22 196 lb (88.9 kg)  01/16/22 195 lb 4.8 oz (88.6 kg)  12/29/21 202 lb (91.6 kg)     GEN: Well nourished, well developed, in no acute distress. HEENT: normal. Neck: Supple, no JVD, carotid bruits, or masses. Cardiac: ***RRR, no murmurs, rubs, or gallops. No clubbing, cyanosis, edema.  ***Radials/PT 2+ and equal bilaterally.  Respiratory:  ***Respirations regular and unlabored, clear to auscultation bilaterally. GI: Soft, nontender, nondistended. MS: No deformity or atrophy. Skin: Warm and dry, no rash. Neuro:  Strength and sensation are intact. Psych: Normal affect.  Assessment & Plan    ***     Disposition: Follow up {follow up:15908} with Skeet Latch, MD or APP.  Signed, Loel Dubonnet, NP 03/01/2022,  2:42 PM [place]

## 2022-03-01 NOTE — Patient Instructions (Signed)
Medication Instructions:  Your physician has recommended you make the following change in your medication:   If you have a breakthrough episode of atrial fibrillation not resolved by extra Flecainide - could trial half tablet of Metoprolol.   *If you need a refill on your cardiac medications before your next appointment, please call your pharmacy*   Lab Work: Your physician recommends that you return for lab work today: magnesium, TSH, CBC, BMP   If you have labs (blood work) drawn today and your tests are completely normal, you will receive your results only by: [place] (if you have MyChart) OR A paper copy in the mail If you have any lab test that is abnormal or we need to change your treatment, we will call you to review the results.   Testing/Procedures: None ordered today. Your EKG showed sinus bradycardia which is a stable finding.    Follow-Up: At [facility], you and your health needs are our priority.  As part of our continuing mission to provide you with exceptional heart care, we have created designated Provider Care Teams.  These Care Teams include your primary Cardiologist (physician) and Advanced Practice Providers (APPs -  Physician Assistants and Nurse Practitioners) who all work together to provide you with the care you need, when you need it.  We recommend signing up for the patient portal called "MyChart".  Sign up information is provided on this After Visit Summary.  MyChart is used to connect with patients for Virtual Visits (Telemedicine).  Patients are able to view lab/test results, encounter notes, upcoming appointments, etc.  Non-urgent messages can be sent to your provider as well.   To learn more about what you can do with MyChart, go to [email or web address].    Your next appointment:   In August as scheduled   Other Instructions  Heart Healthy Diet Recommendations: A low-salt diet is recommended. Meats should be grilled, baked, or boiled.  Avoid fried foods. Focus on lean protein sources like fish or chicken with vegetables and fruits. The American Heart Association is a Microbiologist!  American Heart Association Diet and Lifeystyle Recommendations   Exercise recommendations: The American Heart Association recommends 150 minutes of moderate intensity exercise weekly. Try 30 minutes of moderate intensity exercise 4-5 times per week. This could include walking, jogging, or swimming.   Important Information About Sugar

## 2022-03-02 ENCOUNTER — Telehealth (HOSPITAL_BASED_OUTPATIENT_CLINIC_OR_DEPARTMENT_OTHER): Payer: Self-pay

## 2022-03-02 DIAGNOSIS — Z5181 Encounter for therapeutic drug level monitoring: Secondary | ICD-10-CM

## 2022-03-02 LAB — BASIC METABOLIC PANEL
BUN/Creatinine Ratio: 17 (ref 10–24)
BUN: 23 mg/dL (ref 8–27)
CO2: 24 mmol/L (ref 20–29)
Calcium: 9.6 mg/dL (ref 8.6–10.2)
Chloride: 101 mmol/L (ref 96–106)
Creatinine, Ser: 1.38 mg/dL — ABNORMAL HIGH (ref 0.76–1.27)
Glucose: 126 mg/dL — ABNORMAL HIGH (ref 70–99)
Potassium: 4.6 mmol/L (ref 3.5–5.2)
Sodium: 142 mmol/L (ref 134–144)
eGFR: 54 mL/min/{1.73_m2} — ABNORMAL LOW (ref >59)

## 2022-03-02 LAB — CBC
Hematocrit: 50.9 % (ref 37.5–51.0)
Hemoglobin: 17.6 g/dL (ref 13.0–17.7)
MCH: 29.6 pg (ref 26.6–33.0)
MCHC: 34.6 g/dL (ref 31.5–35.7)
MCV: 86 fL (ref 79–97)
Platelets: 230 10*3/uL (ref 150–450)
RBC: 5.94 x10E6/uL — ABNORMAL HIGH (ref 4.14–5.80)
RDW: 13.4 % (ref 11.6–15.4)
WBC: 9.3 10*3/uL (ref 3.4–10.8)

## 2022-03-02 LAB — TSH: TSH: 2.11 u[IU]/mL (ref 0.450–4.500)

## 2022-03-02 LAB — MAGNESIUM: Magnesium: 2.3 mg/dL (ref 1.6–2.3)

## 2022-03-02 NOTE — Telephone Encounter (Addendum)
Results called to patient who verbalizes understanding! Lab slips mailed to patient.      ----- Message from Loel Dubonnet, NP sent at 03/02/2022  8:09 AM EDT ----- CBC with no evidence of anemia nor infection.  Normal thyroid, electrolytes.  Kidney function slightly decreased from previous likely indicative of dehydration.  Recommend increasing oral fluid intake. Repeat BMP in 1 week.

## 2022-03-09 DIAGNOSIS — Z5181 Encounter for therapeutic drug level monitoring: Secondary | ICD-10-CM | POA: Diagnosis not present

## 2022-03-10 LAB — BASIC METABOLIC PANEL
BUN/Creatinine Ratio: 15 (ref 10–24)
BUN: 15 mg/dL (ref 8–27)
CO2: 19 mmol/L — ABNORMAL LOW (ref 20–29)
Calcium: 9.4 mg/dL (ref 8.6–10.2)
Chloride: 103 mmol/L (ref 96–106)
Creatinine, Ser: 0.97 mg/dL (ref 0.76–1.27)
Glucose: 129 mg/dL — ABNORMAL HIGH (ref 70–99)
Potassium: 4.3 mmol/L (ref 3.5–5.2)
Sodium: 142 mmol/L (ref 134–144)
eGFR: 82 mL/min/{1.73_m2} (ref >59)

## 2022-03-12 ENCOUNTER — Telehealth (HOSPITAL_BASED_OUTPATIENT_CLINIC_OR_DEPARTMENT_OTHER): Payer: Self-pay

## 2022-03-16 DIAGNOSIS — M25511 Pain in right shoulder: Secondary | ICD-10-CM | POA: Diagnosis not present

## 2022-03-18 ENCOUNTER — Other Ambulatory Visit: Payer: Self-pay | Admitting: Family Medicine

## 2022-03-18 ENCOUNTER — Other Ambulatory Visit: Payer: Self-pay | Admitting: Cardiovascular Disease

## 2022-03-18 DIAGNOSIS — I480 Paroxysmal atrial fibrillation: Secondary | ICD-10-CM

## 2022-03-19 NOTE — Telephone Encounter (Signed)
Warfarin refill please

## 2022-03-21 ENCOUNTER — Telehealth: Payer: Self-pay | Admitting: Pharmacist

## 2022-03-21 NOTE — Progress Notes (Signed)
Chronic Care Management Pharmacy Assistant   Name: Francis Campbell.  MRN: [id number] DOB: 1946-07-06  Reason for Encounter: Disease State   Conditions to be addressed/monitored: General Assessment  Recent office visits:  01/16/22 [name], MD - Patient presented for Acute non- recurrent sinusitis and other concerns. Prescribed Amoxicillin. Prescribed Polymyxin.   01/12/22 Billie Ruddy, MD - Patient presented for Post-nasal drainage and other concerns. Prescribed Fexofenadine HCL.  01/12/22 Patient presented for Anti Coag visit. Reading was 3.0.  12/29/21 Lucretia Kern, DO - Patient presented for Nasal congestion and other concerns. Prescribed Benzonatate 100 mg.   12/05/21 Burchette, Alinda Sierras, MD - Patient presented for Otalgia of both ears. Changed Neomycin-Polymyxin HC.   11/29/21 Patient presented for Anti Coag visit. Reading was 2.2  Recent consult visits:  03/23/22 Hemphill, Myrtis Hopping, RN - Patient presented for Anti Coag Visit. Reading was 2.6.  03/16/22 Strader, Rometta Emery, PA ([facility]) - Patient presented for Right Shoulder Pain with possible rotator cuff tear. No medication changes  03/01/22 Loel Dubonnet, NP (Cardiology) - Patient presented for Paroxysmal Atrial Fibrillation and other concerns. Stopped Amoxicillin- Pot Clavulanate. Stopped Benzonatate. Stopped Fexofenadine HCL. Stopped Polymyxin B- Trimethoprim.  02/23/22 Hemphill, Candance B, RN - Patient presented for Anti Coag Visit. Reading was 4.0.  02/06/22 Spainhour, Ann Lions, PA-C - Patient presented for Eustachian tube dysfunction bilateral. Prescribed fluticasone. Recommended Saline  12/29/21 Mar Daring, PA-C [facility]) - Patient presented via Video for Bacterial Pharyngitis. Prescribed Amoxicillin 500 mg.   Hospital visits:  None in previous 6 months  Medications: Outpatient Encounter Medications as of 03/21/2022  Medication Sig   ALPRAZolam (XANAX) 0.25 MG tablet  TAKE 1 TABLET BY MOUTH AT BEDTIME AS NEEDED FOR ANXIETY OR SLEEP.   Cholecalciferol (VITAMIN D3) 1000 UNITS CAPS Take 2,000 Units by mouth every morning.   diltiazem (CARDIZEM CD) 240 MG 24 hr capsule TAKE 1 CAPSULE EVERY MORNING   flecainide (TAMBOCOR) 100 MG tablet Take 1 tablet (100 mg total) by mouth 2 (two) times daily.   losartan (COZAAR) 25 MG tablet TAKE 1 TABLET EVERY DAY   metoprolol succinate (TOPROL-XL) 25 MG 24 hr tablet TAKE 1 TABLET EVERY DAY WITH OR IMMEDIATELY FOLLOWING A MEAL   Multiple Vitamin (MULTIVITAMIN) tablet Take 1 tablet by mouth daily.   neomycin-polymyxin-hydrocortisone (CORTISPORIN) OTIC solution Use 4 drops into both ears three times daily   potassium citrate (UROCIT-K) 10 MEQ (1080 MG) SR tablet TAKE 1 TABLET THREE TIMES DAILY   rosuvastatin (CRESTOR) 20 MG tablet TAKE 1 TABLET EVERY DAY (NEW DOSE)   sertraline (ZOLOFT) 50 MG tablet TAKE 1 TABLET EVERY DAY   warfarin (COUMADIN) 5 MG tablet TAKE 1 TO 1 AND 1/2 TABLETS DAILY AS DIRECTED BY COUMADIN CLINIC   No facility-administered encounter medications on file as of 03/21/2022.  [place]. for General Review Call  Adherence Review:  Does the Clinical Pharmacist Assistant have access to adherence rates? Yes Adherence rates for medications indicated for disease state being reviewed  Losartan 25 mg - Last filled 12/25/21 90 DS at [place]  Losartan 25 mg - Last filled 08/16/22 90 DS at [facility]  Rosuvastatin 20 mg - Last filled 03/19/22 90 DS at [place] Rosuvastatin 20 mg - Last filled 01/08/22 90 DS at [place] Does the patient have >5 day gap between last estimated fill dates for any of the above medications or other medication gaps? No    Disease State  Questions:  Able to connect with Patient? Yes Did patient have any problems with their health recently? No  Have you had any admissions or emergency room visits or worsening of your condition(s) since last visit? No  Have  you had any visits with new specialists or providers since your last visit? No  Have you had any new health care problem(s) since your last visit? No  Have you run out of any of your medications since you last spoke with clinical pharmacist? No  Are there any medications you are not taking as prescribed? No  Are you having any issues or side effects with your medications? No  Do you have any other health concerns or questions you want to discuss with your Clinical Pharmacist before your next visit? No  Are there any health concerns that you feel we can do a better job addressing? No  Are you having any problems with any of the following since the last visit: (select all that apply)  None  12. Any falls since last visit? No   13. Any increased or uncontrolled pain since last visit? No   14. Next visit Type: Call       Visit with: Jeni Salles        Date:12/23        51. Additional Details? No    Care Gaps: Zoster Vaccine - Overdue COVID Booster - Overdue Colonoscopy - Overdue TDAP - Overdue CCM- 12/23 AWV-8/22  Star Rating Drugs: Losartan 25 mg - Last filled 03/19/22 90 DS at [place]  Rosuvastatin 20 mg - Last filled 03/19/22 90 DS at [place] Pharmacist Assistant [phone]

## 2022-03-23 ENCOUNTER — Ambulatory Visit (INDEPENDENT_AMBULATORY_CARE_PROVIDER_SITE_OTHER): Payer: Medicare Other | Admitting: *Deleted

## 2022-03-23 DIAGNOSIS — I480 Paroxysmal atrial fibrillation: Secondary | ICD-10-CM | POA: Diagnosis not present

## 2022-03-23 DIAGNOSIS — Z5181 Encounter for therapeutic drug level monitoring: Secondary | ICD-10-CM | POA: Diagnosis not present

## 2022-03-23 LAB — POCT INR: INR: 2.6 (ref 2.0–3.0)

## 2022-03-23 NOTE — Patient Instructions (Signed)
Description   Continue taking 69m daily except 7.564mevery Monday and Friday. Recheck INR in 5 weeks. [facility] [phone]

## 2022-03-24 ENCOUNTER — Other Ambulatory Visit: Payer: Self-pay | Admitting: Family Medicine

## 2022-03-28 DIAGNOSIS — M25511 Pain in right shoulder: Secondary | ICD-10-CM | POA: Diagnosis not present

## 2022-04-10 ENCOUNTER — Telehealth: Payer: Self-pay | Admitting: Cardiovascular Disease

## 2022-04-10 ENCOUNTER — Telehealth: Payer: Self-pay | Admitting: *Deleted

## 2022-04-10 NOTE — Telephone Encounter (Signed)
FYI--Patient states he is having rotator cuff surgery and it is pending clearance at this time. The office performing the procedure will be sending a formal request. Please contact patient with any updates.

## 2022-04-10 NOTE — Telephone Encounter (Signed)
   Pre-operative Risk Assessment    Patient Name: Francis Campbell.  DOB: 04/23/1946 MRN: [id number]      Request for Surgical Clearance    Procedure:   RIGHT SHOULDER SCOPE, SAD DCR, RCR  Date of Surgery:  Clearance TBD                                 Surgeon:  DR. Hart Robinsons Surgeon's Group or Practice Name:  [facility] Phone number:  [phone] Fax number:  [phone] ATTN: KERRI MAZE   Type of Clearance Requested:   - Medical  - Pharmacy:  Hold Warfarin (Coumadin)     Type of Anesthesia:  General  WITH INTERSCALENE BLOCK   Additional requests/questions:    Jiles Prows   04/10/2022, 12:35 PM

## 2022-04-11 NOTE — Telephone Encounter (Signed)
Clinical pharmacist to review Coumadin

## 2022-04-12 ENCOUNTER — Encounter (HOSPITAL_BASED_OUTPATIENT_CLINIC_OR_DEPARTMENT_OTHER): Payer: Self-pay | Admitting: Cardiovascular Disease

## 2022-04-13 NOTE — Telephone Encounter (Signed)
   Patient Name: Francis Campbell.  DOB: 1945-11-05 MRN: [id number]  Primary Cardiologist: Skeet Latch, MD  Chart reviewed as part of pre-operative protocol coverage. Given past medical history and time since last visit, based on ACC/AHA guidelines, Navin Dogan. would be at acceptable risk for the planned procedure without further cardiovascular testing.   He was last seen 03/01/22 and since that time has done well from a cardiac perspective.   The patient was advised that if he develops new symptoms prior to surgery to contact our office to arrange for a follow-up visit, and he verbalized understanding.  Please call with questions.  Loel Dubonnet, NP 04/13/2022, 8:11 AM

## 2022-04-13 NOTE — Telephone Encounter (Signed)
Patient with diagnosis of afib on warfarin for anticoagulation.    Procedure: RIGHT SHOULDER SCOPE, SAD DCR, RCR Date of procedure: TBD  CHA2DS2-VASc Score = 4  This indicates a 4.8% annual risk of stroke. The patient's score is based upon: CHF History: 0 HTN History: 1 Diabetes History: 0 Stroke History: 0 Vascular Disease History: 1 Age Score: 2 Gender Score: 0   Unclear PMH regarding CVA. "~2005 - had visual symptoms, was told by [name] this may have been a TIA but it's unclear." Forwarded previous clearance last year to Dr Oval Linsey to comment on potential need for Lovenox bridge, she cleared pt to hold warfarin without bridging. Will carry forward same recommendation for this procedure.  CrCl 65m/min Platelet count 230K  Per office protocol, patient can hold warfarin for 5 days prior to procedure. Patient will not need bridging with Lovenox (enoxaparin) around procedure.  **This guidance is not considered finalized until pre-operative APP has relayed final recommendations.**

## 2022-04-13 NOTE — Telephone Encounter (Signed)
   Patient Name: Francis Campbell.  DOB: 1946/01/11 MRN: [id number]  Primary Cardiologist: Skeet Latch, MD  Clinical pharmacists have reviewed the patient's past medical history, labs, and current medications as part of pre-operative protocol coverage. Given past medical history and time since last visit, based on ACC/AHA guidelines, Francis Campbell. would be at acceptable risk for the planned procedure without further cardiovascular testing.   Patient with diagnosis of afib on warfarin for anticoagulation.     Procedure: RIGHT SHOULDER SCOPE, SAD DCR, RCR Date of procedure: TBD   CHA2DS2-VASc Score = 4  This indicates a 4.8% annual risk of stroke. The patient's score is based upon: CHF History: 0 HTN History: 1 Diabetes History: 0 Stroke History: 0 Vascular Disease History: 1 Age Score: 2 Gender Score: 0   Unclear PMH regarding CVA. "~2005 - had visual symptoms, was told by [name] this may have been a TIA but it's unclear." Forwarded previous clearance last year to Dr Oval Linsey to comment on potential need for Lovenox bridge, she cleared pt to hold warfarin without bridging. Will carry forward same recommendation for this procedure.   CrCl 11m/min Platelet count 230K   Per office protocol, patient can hold warfarin for 5 days prior to procedure. Patient will not need bridging with Lovenox (enoxaparin) around procedure.  I will route this recommendation to the requesting party via Epic fax function and remove from pre-op pool.  Please call with questions.  ELenna Sciara NP 04/13/2022, 12:04 PM

## 2022-04-16 ENCOUNTER — Ambulatory Visit (INDEPENDENT_AMBULATORY_CARE_PROVIDER_SITE_OTHER): Payer: Medicare Other | Admitting: Family Medicine

## 2022-04-16 ENCOUNTER — Encounter: Payer: Self-pay | Admitting: Family Medicine

## 2022-04-16 VITALS — BP 124/70 | HR 60 | Temp 97.9°F | Ht 74.0 in | Wt 195.8 lb

## 2022-04-16 DIAGNOSIS — Z01818 Encounter for other preprocedural examination: Secondary | ICD-10-CM | POA: Diagnosis not present

## 2022-04-16 DIAGNOSIS — I480 Paroxysmal atrial fibrillation: Secondary | ICD-10-CM

## 2022-04-16 DIAGNOSIS — E7800 Pure hypercholesterolemia, unspecified: Secondary | ICD-10-CM | POA: Diagnosis not present

## 2022-04-16 DIAGNOSIS — I119 Hypertensive heart disease without heart failure: Secondary | ICD-10-CM

## 2022-04-16 NOTE — Progress Notes (Signed)
Established Patient Office Visit  Subjective   Patient ID: Francis Campbell., male    DOB: 07/28/1946  Age: 76 y.o. MRN: [id number]  Chief Complaint  Patient presents with   Pre-op Exam    HPI   Here for preoperative clearance for right shoulder surgery.  He states about a month ago he was picking up his mower and felt sharp pain right shoulder.  Later that day he went to throw something for his dog and felt a "pop "in the right shoulder.  Had some immediate pain.  Decreased strength.  Went to orthopedic provider promptly and MRI confirmed torn right rotator cuff.  He is being scheduled for surgery.  He is somewhat ambidextrous.     He does have history of atrial fibrillation and is on Coumadin.  He apparently has been cleared by cardiology already.  He states he knows when he is in atrial fibrillation and has had no recent bouts.  Denies any recent chest pains.  No history of diabetes.  He has hyperlipidemia treated with Crestor.  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  He had multiple recent labs including basic metabolic panel, magnesium, TSH, CBC.  These were all stable.  Past Medical History:  Diagnosis Date   Allergy    Aortic regurgitation    Arthritis    Asthma    only as child   Atrial flutter ([place])    a. Dx 09/2012.   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   TIA (transient ischemic attack)    ~2005 - had visual symptoms, was told by [name] this may have been a TIA but it's unclear   Valvular heart disease    Mild AI, ASclerosis without AS by echo 10/2011   Past Surgical History:  Procedure Laterality Date   CARDIOVERSION N/A 05/08/2013   Procedure: CARDIOVERSION;  Surgeon: Darlin Coco, MD;  Location: [facility] ENDOSCOPY;   Service: Cardiovascular;  Laterality: N/A;   CATARACT EXTRACTION     Right 12/2019   CATARACT EXTRACTION     CYSTOSCOPY/RETROGRADE/URETEROSCOPY/STONE EXTRACTION WITH BASKET Right 02/12/2013   Procedure: CYSTOSCOPY/RETROGRADE/URETEROSCOPY/STONE EXTRACTION WITH BASKET, WITH  STENT PLACEMENT RIGHT URETERAL DILATION;  Surgeon: [name], MD;  Location: [facility] ORS;  Service: Urology;  Laterality: Right;   EYE SURGERY Bilateral 1950, 1960, 1964   left, right, left   HERNIA REPAIR  2005   KNEE SURGERY Bilateral 1990, 2000   LITHOTRIPSY     ROTATOR CUFF REPAIR Left 2000   TONSILLECTOMY      reports that he has never smoked. He has never used smokeless tobacco. He reports that he does not drink alcohol and does not use drugs. family history includes Appendicitis in his son; Arthritis in his paternal grandmother; Heart disease in his father and paternal grandmother; Hypertension in his mother; Kidney cancer in his son; Kidney disease in his mother; Leukemia in his father; Nephrolithiasis in his brother and father; Stroke in his mother. Allergies  Allergen Reactions   Chocolate Palpitations   Chocolate Flavor Shortness Of Breath    Other reaction(s): Respiratory Distress   Dextroamphetamine Shortness Of Breath    Other reaction(s): Respiratory Distress   Dextromethorphan Palpitations   Flomax [Tamsulosin Hcl]  Other (See Comments)    ? Causes A Fib   Floxin [Ofloxacin] Other (See Comments)    hallucinations   Guaifenesin Shortness Of Breath   Lisinopril Cough   Tavist Allergy [Clemastine Fumarate] Other (See Comments)    A fib worse   Clemastine     Other reaction(s): Irregular Heart Rate   Robitussin Cold+Flu Daytime  [Dm-Phenylephrine-Acetaminophen]     Other reaction(s): Respiratory Distress   Tamsulosin Other (See Comments)    Other reaction(s): Irregular Heart Rate   Amiodarone Other (See Comments)    Abn  thyroid   Robitussin Cf [Pseudoephedrine-Dm-Gg] Palpitations    Review of  Systems  Constitutional:  Negative for malaise/fatigue.  Eyes:  Negative for blurred vision.  Respiratory:  Negative for shortness of breath.   Cardiovascular:  Negative for chest pain.  Gastrointestinal:  Negative for abdominal pain.  Neurological:  Negative for dizziness, weakness and headaches.      Objective:     BP 124/70 (BP Location: Left Arm, Patient Position: Sitting, Cuff Size: Normal)   Pulse 60   Temp 97.9 F (36.6 C) (Oral)   Ht 6' 2"  (1.88 m)   Wt 195 lb 12.8 oz (88.8 kg)   SpO2 98%   BMI 25.14 kg/m  BP Readings from Last 3 Encounters:  04/16/22 124/70  03/01/22 128/62  01/16/22 122/60   Wt Readings from Last 3 Encounters:  04/16/22 195 lb 12.8 oz (88.8 kg)  03/01/22 196 lb (88.9 kg)  01/16/22 195 lb 4.8 oz (88.6 kg)      Physical Exam Vitals reviewed.  Constitutional:      Appearance: Normal appearance.  Cardiovascular:     Rate and Rhythm: Normal rate and regular rhythm.     Heart sounds:     No gallop.  Pulmonary:     Effort: Pulmonary effort is normal.     Breath sounds: Normal breath sounds. No wheezing or rales.  Abdominal:     Palpations: Abdomen is soft. There is no mass.     Tenderness: There is no abdominal tenderness. There is no guarding or rebound.  Musculoskeletal:     Right lower leg: No edema.     Left lower leg: No edema.  Neurological:     Mental Status: He is alert.      No results found for any visits on 04/16/22.  Last CBC Lab Results  Component Value Date   WBC 9.3 03/01/2022   HGB 17.6 03/01/2022   HCT 50.9 03/01/2022   MCV 86 03/01/2022   MCH 29.6 03/01/2022   RDW 13.4 03/01/2022   PLT 230 59/56/3875   Last metabolic panel Lab Results  Component Value Date   GLUCOSE 129 (H) 03/09/2022   NA 142 03/09/2022   K 4.3 03/09/2022   CL 103 03/09/2022   CO2 19 (L) 03/09/2022   BUN 15 03/09/2022   CREATININE 0.97 03/09/2022   EGFR 82 03/09/2022   CALCIUM 9.4 03/09/2022   PROT 6.6 05/17/2021   ALBUMIN 4.4  05/17/2021   LABGLOB 2.2 05/17/2021   AGRATIO 2.0 05/17/2021   BILITOT 0.6 05/17/2021   ALKPHOS 82 05/17/2021   AST 27 05/17/2021   ALT 21 05/17/2021   ANIONGAP 7 11/21/2019   Last lipids Lab Results  Component Value Date   CHOL 137 05/17/2021   HDL 45 05/17/2021   LDLCALC 76 05/17/2021   LDLDIRECT 96.0 10/07/2017   TRIG 85 05/17/2021   CHOLHDL 3.0 05/17/2021   Last thyroid functions Lab Results  Component Value Date   TSH 2.110 03/01/2022      The 10-year ASCVD risk score (Arnett DK, et al., 2019) is: 24.9%    Assessment & Plan:   #1 preoperative clearance for right rotator cuff surgery.  He has already been cleared by cardiology.  No known medical contraindications.  No history of diabetes.  No recent concerning cardiac or neurologic symptoms. -Forms completed with no contraindications. -Patient is on Coumadin and knows that he will need to hold this for at least 5 days prior to surgery.  He is followed through Coumadin clinic through cardiology -We will not need to bridge with Lovenox  #2 history of intermittent atrial fibrillation.  Currently in sinus rhythm.  #3 history of hypertension currently stable on low-dose losartan and metoprolol as well as Cardizem  No follow-ups on file.    [name], MD

## 2022-04-24 DIAGNOSIS — G8918 Other acute postprocedural pain: Secondary | ICD-10-CM | POA: Diagnosis not present

## 2022-04-24 DIAGNOSIS — Y999 Unspecified external cause status: Secondary | ICD-10-CM | POA: Diagnosis not present

## 2022-04-24 DIAGNOSIS — X58XXXA Exposure to other specified factors, initial encounter: Secondary | ICD-10-CM | POA: Diagnosis not present

## 2022-04-24 DIAGNOSIS — M19011 Primary osteoarthritis, right shoulder: Secondary | ICD-10-CM | POA: Diagnosis not present

## 2022-04-24 DIAGNOSIS — M7541 Impingement syndrome of right shoulder: Secondary | ICD-10-CM | POA: Diagnosis not present

## 2022-04-24 DIAGNOSIS — S46011A Strain of muscle(s) and tendon(s) of the rotator cuff of right shoulder, initial encounter: Secondary | ICD-10-CM | POA: Diagnosis not present

## 2022-04-24 HISTORY — PX: SHOULDER SURGERY: SHX246

## 2022-04-30 ENCOUNTER — Encounter (HOSPITAL_BASED_OUTPATIENT_CLINIC_OR_DEPARTMENT_OTHER): Payer: Self-pay | Admitting: Cardiovascular Disease

## 2022-04-30 ENCOUNTER — Ambulatory Visit (INDEPENDENT_AMBULATORY_CARE_PROVIDER_SITE_OTHER): Payer: PPO | Admitting: Cardiovascular Disease

## 2022-04-30 DIAGNOSIS — I10 Essential (primary) hypertension: Secondary | ICD-10-CM | POA: Diagnosis not present

## 2022-04-30 DIAGNOSIS — E7800 Pure hypercholesterolemia, unspecified: Secondary | ICD-10-CM | POA: Diagnosis not present

## 2022-04-30 DIAGNOSIS — I7121 Aneurysm of the ascending aorta, without rupture: Secondary | ICD-10-CM

## 2022-04-30 HISTORY — DX: Aneurysm of the ascending aorta, without rupture: I71.21

## 2022-04-30 NOTE — Assessment & Plan Note (Signed)
He is due for fasting lipids.  He will come back for fasting lipids and a CMP.  Continue rosuvastatin.

## 2022-04-30 NOTE — Assessment & Plan Note (Signed)
Blood pressure is very well controlled on diltiazem, losartan, and metoprolol.

## 2022-04-30 NOTE — Patient Instructions (Signed)
Medication Instructions:  Your Physician recommend you continue on your current medication as directed.    *If you need a refill on your cardiac medications before your next appointment, please call your pharmacy*   Lab Work: Please return for Lab work for fasting lipid panel and CMP within the next couple weeks. You may come to the...   [facility] (3rd floor) [address], [place], [place] [address]  Open: 8am-Noon and 1pm-4:30pm  Please ring the doorbell on the small table when you exit the elevator and the Lab Tech will come get you  [place] [facility] [address] [place], [place], [place] [address] Open: 8am-1pm, then 2pm-4:30pm   [place]- Please see attached locations sheet stapled to your lab work with address and hours.   If you have labs (blood work) drawn today and your tests are completely normal, you will receive your results only by: [place] (if you have MyChart) OR A paper copy in the mail If you have any lab test that is abnormal or we need to change your treatment, we will call you to review the results.  Testing:  Your physician has requested that you have an echocardiogram. Echocardiography is a painless test that uses sound waves to create images of your heart. It provides your doctor with information about the size and shape of your heart and how well your heart's chambers and valves are working. This procedure takes approximately one hour. There are no restrictions for this procedure. [place], you and your health needs are our priority.  As part of our continuing mission to provide you with exceptional heart care, we have created designated Provider Care Teams.  These Care Teams include your primary Cardiologist (physician) and Advanced Practice Providers (APPs -  Physician Assistants and Nurse Practitioners) who all work together to provide you with  the care you need, when you need it.  We recommend signing up for the patient portal called "MyChart".  Sign up information is provided on this After Visit Summary.  MyChart is used to connect with patients for Virtual Visits (Telemedicine).  Patients are able to view lab/test results, encounter notes, upcoming appointments, etc.  Non-urgent messages can be sent to your provider as well.   To learn more about what you can do with MyChart, go to [email or web address].    Your next appointment:   Follow up 6 months with Laurann Montana, NP   Follow up 1 year with Dr. Oval Linsey   Other Instructions Heart Healthy Diet Recommendations: A low-salt diet is recommended. Meats should be grilled, baked, or boiled. Avoid fried foods. Focus on lean protein sources like fish or chicken with vegetables and fruits. The American Heart Association is a Microbiologist!  American Heart Association Diet and Lifeystyle Recommendations   Exercise recommendations: The American Heart Association recommends 150 minutes of moderate intensity exercise weekly. Try 30 minutes of moderate intensity exercise 4-5 times per week. This could include walking, jogging, or swimming.   Important Information About Sugar      \

## 2022-04-30 NOTE — Progress Notes (Signed)
Cardiology Office Note   Date:  04/30/2022   ID:  Francis Stai., DOB [birth year], MRN [id number]  PCP:  Francis Post, MD  Cardiologist:   Francis Latch, MD  Cardiology APP: Francis Deforest, PA  No chief complaint on file.    History of Present Illness: Francis Tedesco. is a 76 y.o. male with paroxysmal atrial fibrillation on coumadin, hypertension, hyperlipidemia, mild ascending aortic aneurysm, and Crohn's disease who presents for follow up.  Francis Campbell was previously a patient of [name].   Francis Campbell was evaluated by [name] for catheter ablation in 2014 and elected to use flecainide as a "pill in the pocket."  He underwent DCCV 04/2013.  He had a negative Myoview 09/2011 and an echo 10/2011 that revealed LVEF 60% with mild AR.  He noted increasing episodes of atrial fibrillation and started on daily flecainide 04/2016.  He developed bradycardia, so metoprolol was reduced.    Francis Campbell was seen in the ED 08/2017 with intermittent chest tightness.  His symptoms were felt to be atypical and EKG was unremarkable.  Troponin and chest x-ray were within normal limits.  He followed up in clinic and was referred for an exercise Myoview 09/2017 that was negative for ischemia.  At his last appointment he was doing well.  He was exercising regularly.  He did report a couple episodes of atrial fibrillation.  He called the office due to elevated blood pressures and lightheadedness.  He saw Francis Montana, NP on 02/2022 but his blood pressure was improved and he was feeling better by that time.  Given that he had a recent episode of atrial fibrillation he had TSH and electrolytes checked, all of which were within normal limits.  He had rotator cuff surgery last week.  He starts PT tomorrow.  He is managing the pain with ibuprofen and acetaminophen.  He hasn't had any atrial fibrillation lately.  His breathing has been stable.  He has no chest pain, shortness of breath, lower extremity edema,  orthopnea, or PND.  He did not have any postoperative atrial fibrillation.  Past Medical History:  Diagnosis Date   Allergy    Aortic regurgitation    Arthritis    Ascending aortic aneurysm ([place]) 04/30/2022   Asthma    only as child   Atrial flutter ([place])    a. Dx 09/2012.   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   TIA (transient ischemic attack)    ~2005 - had visual symptoms, was told by [name] this may have been a TIA but it's unclear   Valvular heart disease    Mild AI, ASclerosis without AS by echo 10/2011    Past Surgical History:  Procedure Laterality Date   CARDIOVERSION N/A 05/08/2013   Procedure: CARDIOVERSION;  Surgeon: Darlin Coco, MD;  Location: [facility]COPY;  Service: Cardiovascular;  Laterality: N/A;   CATARACT EXTRACTION     Right 12/2019   CATARACT EXTRACTION     CYSTOSCOPY/RETROGRADE/URETEROSCOPY/STONE EXTRACTION WITH BASKET Right 02/12/2013   Procedure: CYSTOSCOPY/RETROGRADE/URETEROSCOPY/STONE EXTRACTION WITH BASKET, WITH  STENT PLACEMENT RIGHT URETERAL DILATION;  Surgeon: [name], MD;  Location: [facility] ORS;  Service: Urology;  Laterality: Right;   EYE SURGERY Bilateral  [place]   left, right, left   HERNIA REPAIR  2005   KNEE SURGERY Bilateral 1990, 2000   LITHOTRIPSY     ROTATOR CUFF REPAIR Left 2000   TONSILLECTOMY       Current Outpatient Medications  Medication Sig Dispense Refill   ALPRAZolam (XANAX) 0.25 MG tablet TAKE 1 TABLET BY MOUTH AT BEDTIME AS NEEDED FOR ANXIETY OR SLEEP. 30 tablet 5   Cholecalciferol (VITAMIN D3) 1000 UNITS CAPS Take 2,000 Units by mouth every morning.     diltiazem (CARDIZEM CD) 240 MG 24 hr capsule TAKE 1 CAPSULE EVERY MORNING 90 capsule 3   flecainide (TAMBOCOR) 100 MG tablet Take 1 tablet (100 mg  total) by mouth 2 (two) times daily. 180 tablet 3   losartan (COZAAR) 25 MG tablet TAKE 1 TABLET EVERY DAY 90 tablet 3   metoprolol succinate (TOPROL-XL) 25 MG 24 hr tablet TAKE 1 TABLET EVERY DAY WITH OR IMMEDIATELY FOLLOWING A MEAL 90 tablet 0   Multiple Vitamin (MULTIVITAMIN) tablet Take 1 tablet by mouth daily.     neomycin-polymyxin-hydrocortisone (CORTISPORIN) OTIC solution Use 4 drops into both ears three times daily 10 mL 0   potassium citrate (UROCIT-K) 10 MEQ (1080 MG) SR tablet TAKE 1 TABLET THREE TIMES DAILY 270 tablet 3   rosuvastatin (CRESTOR) 20 MG tablet TAKE 1 TABLET EVERY DAY (NEW DOSE) 90 tablet 3   sertraline (ZOLOFT) 50 MG tablet TAKE 1 TABLET EVERY DAY 90 tablet 0   warfarin (COUMADIN) 5 MG tablet TAKE 1 TO 1 AND 1/2 TABLETS DAILY AS DIRECTED BY COUMADIN CLINIC 135 tablet 1   No current facility-administered medications for this visit.    Allergies:   Chocolate, Chocolate flavor, Dextroamphetamine, Dextromethorphan, Flomax [tamsulosin hcl], Floxin [ofloxacin], Guaifenesin, Lisinopril, Tavist allergy [clemastine fumarate], Clemastine, Robitussin cold+flu daytime  [dm-phenylephrine-acetaminophen], Tamsulosin, Amiodarone, and Robitussin cf [pseudoephedrine-dm-gg]    Social History:  The patient  reports that he has never smoked. He has never used smokeless tobacco. He reports that he does not drink alcohol and does not use drugs.   Family History:  The patient's family history includes Appendicitis in his son; Arthritis in his paternal grandmother; Heart disease in his father and paternal grandmother; Hypertension in his mother; Kidney cancer in his son; Kidney disease in his mother; Leukemia in his father; Nephrolithiasis in his brother and father; Stroke in his mother.    ROS:  Please see the history of present illness.   Otherwise, review of systems are positive for none.   All other systems are reviewed and negative.    PHYSICAL EXAM: VS:  BP 120/66 (BP Location: Left  Arm, Patient Position: Sitting, Cuff Size: Normal)   Pulse 66   Ht 6' 2"  (1.88 m)   Wt 198 lb (89.8 kg)   SpO2 97%   BMI 25.42 kg/m  , BMI Body mass index is 25.42 kg/m. GENERAL:  Well appearing HEENT: Pupils equal round and reactive, fundi not visualized, oral mucosa unremarkable NECK:  No jugular venous distention, waveform within normal limits, carotid upstroke brisk and symmetric, no bruits LUNGS:  Clear to auscultation bilaterally HEART:  RRR.  PMI not displaced or sustained,S1 and S2 within normal limits, no S3, no S4, no clicks, no rubs, no murmurs ABD:  Flat, positive bowel sounds normal in frequency in pitch, no bruits, no rebound, no guarding, no midline pulsatile mass, no hepatomegaly, no splenomegaly EXT:  2 plus pulses throughout, no edema, no cyanosis no clubbing SKIN:  No rashes  no nodules NEURO:  Cranial nerves II through XII grossly intact, motor grossly intact throughout PSYCH:  Cognitively intact, oriented to person place and time  EKG:  EKG is ordered today. The ekg ordered 06/26/16 demonstrates sinus bradycardia rate 52.  06/04/17: Sinus bradycardia.  Rate 48 bpm. 08/27/18: Sinus rhythm.  Rate 62 bpm.   07/14/19: Sinus rhythm.  Rate 60 bpm.  05/10/2020: Sinus rhythm.  Rate 60 bpm.  Incomplete right bundle branch block. 05/10/21: Sinus bradycardia.  Rate 51 bpm.  QTc 444 ms  Echo 01/03/18: Study Conclusions   - Left ventricle: The cavity size was normal. Wall thickness was   increased in a pattern of mild LVH. Systolic function was normal.   The estimated ejection fraction was in the range of 60% to 65%.   Wall motion was normal; there were no regional wall motion   abnormalities. Left ventricular diastolic function parameters   were normal. - Aortic valve: Dilated aortic sinus. There was mild regurgitation. - Atrial septum: No defect or patent foramen ovale was identified. - Pulmonary arteries: PA peak pressure: 31 mm Hg (S).  Exercise Myoview 09/25/17:   The  patient walked for a total of 10 minutes and 15 seconds under standard Bruce protocol treadmill test. The peak heart rate is 142 which is 96% predicted maximal heart rate. There were no ST or T wave changes to suggest ischemia. Nuclear stress EF: 61%. The left ventricular ejection fraction is normal (55-65%). This is a low risk study. The study is normal.  Recent Labs: 05/17/2021: ALT 21 03/01/2022: Hemoglobin 17.6; Magnesium 2.3; Platelets 230; TSH 2.110 03/09/2022: BUN 15; Creatinine, Ser 0.97; Potassium 4.3; Sodium 142    Lipid Panel    Component Value Date/Time   CHOL 137 05/17/2021 0835   TRIG 85 05/17/2021 0835   HDL 45 05/17/2021 0835   CHOLHDL 3.0 05/17/2021 0835   CHOLHDL 5 10/07/2017 1510   VLDL 58.4 (H) 10/07/2017 1510   LDLCALC 76 05/17/2021 0835   LDLDIRECT 96.0 10/07/2017 1510      Wt Readings from Last 3 Encounters:  04/30/22 198 lb (89.8 kg)  04/16/22 195 lb 12.8 oz (88.8 kg)  03/01/22 196 lb (88.9 kg)     ASSESSMENT AND PLAN:  Ascending aortic aneurysm (HCC) Mild ascending aortic aneurysm on prior echo.  We will repeat his echocardiogram.  He also has a history of aortic regurgitation and this will allow [place] to look at that as well.  He has no heart failure symptoms.  Blood pressure is well controlled.  Paroxysmal atrial fibrillation He has a history of paroxysmal atrial fibrillation.  He has not had any episodes lately.  He is on warfarin for anticoagulation.  He is maintaining sinus rhythm on flecainide.  Continue diltiazem and metoprolol.  Pure hypercholesterolemia He is due for fasting lipids.  He will come back for fasting lipids and a CMP.  Continue rosuvastatin.  Essential hypertension Blood pressure is very well controlled on diltiazem, losartan, and metoprolol.   Current medicines are reviewed at length with the patient today.  The patient does not have concerns regarding medicines.  The following changes have been made:  none  Labs/ tests  ordered today include:   Orders Placed This Encounter  Procedures   Lipid panel   Comprehensive metabolic panel   ECHOCARDIOGRAM COMPLETE      Disposition:   FU with Ardon Franklin C. Oval Linsey, MD, [facility] in 1 year.  APP in 6 months.     Signed, Balraj Brayfield C.  Oval Linsey, MD, [facility]  04/30/2022 12:21 PM    [place]

## 2022-04-30 NOTE — Assessment & Plan Note (Addendum)
Mild ascending aortic aneurysm on prior echo.  We will repeat his echocardiogram.  He also has a history of aortic regurgitation and this will allow [place] to look at that as well.  He has no heart failure symptoms.  Blood pressure is well controlled.

## 2022-04-30 NOTE — Assessment & Plan Note (Signed)
He has a history of paroxysmal atrial fibrillation.  He has not had any episodes lately.  He is on warfarin for anticoagulation.  He is maintaining sinus rhythm on flecainide.  Continue diltiazem and metoprolol.

## 2022-05-01 DIAGNOSIS — M25511 Pain in right shoulder: Secondary | ICD-10-CM | POA: Diagnosis not present

## 2022-05-01 DIAGNOSIS — M25611 Stiffness of right shoulder, not elsewhere classified: Secondary | ICD-10-CM | POA: Diagnosis not present

## 2022-05-02 ENCOUNTER — Ambulatory Visit (INDEPENDENT_AMBULATORY_CARE_PROVIDER_SITE_OTHER): Payer: PPO

## 2022-05-02 DIAGNOSIS — Z5181 Encounter for therapeutic drug level monitoring: Secondary | ICD-10-CM

## 2022-05-02 DIAGNOSIS — I480 Paroxysmal atrial fibrillation: Secondary | ICD-10-CM

## 2022-05-02 DIAGNOSIS — I7121 Aneurysm of the ascending aorta, without rupture: Secondary | ICD-10-CM | POA: Diagnosis not present

## 2022-05-02 LAB — LIPID PANEL
Chol/HDL Ratio: 3.8 ratio (ref 0.0–5.0)
Cholesterol, Total: 156 mg/dL (ref 100–199)
HDL: 41 mg/dL (ref >39)
LDL Chol Calc (NIH): 82 mg/dL (ref 0–99)
Triglycerides: 192 mg/dL — ABNORMAL HIGH (ref 0–149)
VLDL Cholesterol Cal: 33 mg/dL (ref 5–40)

## 2022-05-02 LAB — COMPREHENSIVE METABOLIC PANEL
ALT: 19 IU/L (ref 0–44)
AST: 19 IU/L (ref 0–40)
Albumin/Globulin Ratio: 2 (ref 1.2–2.2)
Albumin: 4.3 g/dL (ref 3.8–4.8)
Alkaline Phosphatase: 90 IU/L (ref 44–121)
BUN/Creatinine Ratio: 26 — ABNORMAL HIGH (ref 10–24)
BUN: 25 mg/dL (ref 8–27)
Bilirubin Total: 0.6 mg/dL (ref 0.0–1.2)
CO2: 22 mmol/L (ref 20–29)
Calcium: 9 mg/dL (ref 8.6–10.2)
Chloride: 105 mmol/L (ref 96–106)
Creatinine, Ser: 0.95 mg/dL (ref 0.76–1.27)
Globulin, Total: 2.1 g/dL (ref 1.5–4.5)
Glucose: 97 mg/dL (ref 70–99)
Potassium: 4.4 mmol/L (ref 3.5–5.2)
Sodium: 140 mmol/L (ref 134–144)
Total Protein: 6.4 g/dL (ref 6.0–8.5)
eGFR: 83 mL/min/{1.73_m2} (ref >59)

## 2022-05-02 LAB — POCT INR: INR: 2.3 (ref 2.0–3.0)

## 2022-05-02 NOTE — Patient Instructions (Signed)
Description   Continue taking 62m daily except 7.530mevery Mondays and Fridays. Recheck INR in 6 weeks. [facility] [phone]

## 2022-05-04 DIAGNOSIS — M25611 Stiffness of right shoulder, not elsewhere classified: Secondary | ICD-10-CM | POA: Diagnosis not present

## 2022-05-04 DIAGNOSIS — M25511 Pain in right shoulder: Secondary | ICD-10-CM | POA: Diagnosis not present

## 2022-05-07 DIAGNOSIS — Z4789 Encounter for other orthopedic aftercare: Secondary | ICD-10-CM | POA: Diagnosis not present

## 2022-05-08 ENCOUNTER — Telehealth: Payer: Self-pay | Admitting: Family Medicine

## 2022-05-08 NOTE — Telephone Encounter (Signed)
Left message for patient to call back and schedule Medicare Annual Wellness Visit (AWV) either virtually or in office. Left  my Herbie Drape number [phone]   Last AWV ;05/08/21  please schedule at anytime with [facility] Nurse Health Advisor 1 or 2

## 2022-05-09 ENCOUNTER — Telehealth (HOSPITAL_BASED_OUTPATIENT_CLINIC_OR_DEPARTMENT_OTHER): Payer: Self-pay

## 2022-05-09 DIAGNOSIS — E7800 Pure hypercholesterolemia, unspecified: Secondary | ICD-10-CM

## 2022-05-09 MED ORDER — ROSUVASTATIN CALCIUM 40 MG PO TABS: 40.0000 mg | ORAL_TABLET | Freq: Every day | ORAL | 3 refills | Status: DC

## 2022-05-09 NOTE — Telephone Encounter (Addendum)
Results called to patient who verbalizes understanding! Labs ordered and mailed, prescriptions updated and sent to pharmacy on file.    ----- Message from Loel Dubonnet, NP sent at 05/08/2022 12:51 PM EDT ----- Normal kidneys, liver, electrolytes.  Triglycerides mildly elevated-recommend decrease intake of carbs, sweets, sugars.  LDL of 82 which is above goal of less than 70.  Recommend increase rosuvastatin to 40 mg daily with repeat FLP/LFT in 2 months.

## 2022-05-10 ENCOUNTER — Other Ambulatory Visit (HOSPITAL_BASED_OUTPATIENT_CLINIC_OR_DEPARTMENT_OTHER): Payer: PPO

## 2022-05-14 ENCOUNTER — Ambulatory Visit (INDEPENDENT_AMBULATORY_CARE_PROVIDER_SITE_OTHER): Payer: PPO

## 2022-05-14 ENCOUNTER — Encounter (HOSPITAL_BASED_OUTPATIENT_CLINIC_OR_DEPARTMENT_OTHER): Payer: Self-pay

## 2022-05-14 VITALS — Ht 73.0 in | Wt 194.0 lb

## 2022-05-14 DIAGNOSIS — Z0000 Encounter for general adult medical examination without abnormal findings: Secondary | ICD-10-CM

## 2022-05-14 MED ORDER — ROSUVASTATIN CALCIUM 40 MG PO TABS
40.0000 mg | ORAL_TABLET | Freq: Every day | ORAL | 3 refills | Status: DC
  Filled 2022-05-25 – 2023-02-25 (×2): qty 90, 90d supply, fill #0

## 2022-05-14 NOTE — Progress Notes (Signed)
I connected with Francis Campbell. today by telephone and verified that I am speaking with the correct person using two identifiers. Location patient: home Location provider: work Persons participating in the virtual visit: Dimitry Holsworth., Glenna Durand LPN.   I discussed the limitations, risks, security and privacy concerns of performing an evaluation and management service by telephone and the availability of in person appointments. I also discussed with the patient that there may be a patient responsible charge related to this service. The patient expressed understanding and verbally consented to this telephonic visit.    Interactive audio and video telecommunications were attempted between this provider and patient, however failed, due to patient having technical difficulties OR patient did not have access to video capability.  We continued and completed visit with audio only.     Vital signs may be patient reported or missing.  Subjective:   Francis Campbell. is a 76 y.o. male who presents for Medicare Annual/Subsequent preventive examination.  Review of Systems     Cardiac Risk Factors include: advanced age (>50mn, >>44women);hypertension;male gender     Objective:    Today's Vitals   05/14/22 0826  Weight: 194 lb (88 kg)  Height: 6' 1"  (1.854 m)   Body mass index is 25.6 kg/m.     05/14/2022    8:31 AM 05/08/2021    2:37 PM 05/04/2020    2:53 PM 11/20/2019    5:21 AM 10/11/2016   10:42 AM 08/18/2015    5:00 PM 06/27/2015   10:35 AM  Advanced Directives  Does Patient Have a Medical Advance Directive? Yes Yes Yes No No No Yes  Type of AParamedicof AFort SupplyLiving will HDe SotoLiving will HWolf[place]Living will    Living will  Does patient want to make changes to medical advance directive?   No - Patient declined    No - Patient declined  Copy of [place] Chart? No - copy requested No  - copy requested No - copy requested    Yes  Would patient like information on creating a medical advance directive?    No - Patient declined  No - patient declined information     Current Medications (verified) Outpatient Encounter Medications as of 05/14/2022  Medication Sig   ALPRAZolam (XANAX) 0.25 MG tablet TAKE 1 TABLET BY MOUTH AT BEDTIME AS NEEDED FOR ANXIETY OR SLEEP.   Cholecalciferol (VITAMIN D3) 1000 UNITS CAPS Take 2,000 Units by mouth every morning.   diltiazem (CARDIZEM CD) 240 MG 24 hr capsule TAKE 1 CAPSULE EVERY MORNING   flecainide (TAMBOCOR) 100 MG tablet Take 1 tablet (100 mg total) by mouth 2 (two) times daily.   losartan (COZAAR) 25 MG tablet TAKE 1 TABLET EVERY DAY   metoprolol succinate (TOPROL-XL) 25 MG 24 hr tablet TAKE 1 TABLET EVERY DAY WITH OR IMMEDIATELY FOLLOWING A MEAL   Multiple Vitamin (MULTIVITAMIN) tablet Take 1 tablet by mouth daily.   neomycin-polymyxin-hydrocortisone (CORTISPORIN) OTIC solution Use 4 drops into both ears three times daily   potassium citrate (UROCIT-K) 10 MEQ (1080 MG) SR tablet TAKE 1 TABLET THREE TIMES DAILY   rosuvastatin (CRESTOR) 40 MG tablet Take 1 tablet (40 mg total) by mouth daily.   sertraline (ZOLOFT) 50 MG tablet TAKE 1 TABLET EVERY DAY   warfarin (COUMADIN) 5 MG tablet TAKE 1 TO 1 AND 1/2 TABLETS DAILY AS DIRECTED BY COUMADIN CLINIC   No facility-administered encounter medications on file  as of 05/14/2022.    Allergies (verified) Chocolate, Chocolate flavor, Dextroamphetamine, Dextromethorphan, Flomax [tamsulosin hcl], Floxin [ofloxacin], Guaifenesin, Lisinopril, Tavist allergy [clemastine fumarate], Clemastine, Robitussin cold+flu daytime  [dm-phenylephrine-acetaminophen], Tamsulosin, Amiodarone, and Robitussin cf [pseudoephedrine-dm-gg]   History: Past Medical History:  Diagnosis Date   Allergy    Aortic regurgitation    Arthritis    Ascending aortic aneurysm ([place]) 04/30/2022   Asthma    only as child   Atrial  flutter ([place])    a. Dx 09/2012.   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   TIA (transient ischemic attack)    ~2005 - had visual symptoms, was told by [name] this may have been a TIA but it's unclear   Valvular heart disease    Mild AI, ASclerosis without AS by echo 10/2011   Past Surgical History:  Procedure Laterality Date   CARDIOVERSION N/A 05/08/2013   Procedure: CARDIOVERSION;  Surgeon: Darlin Coco, MD;  Location: [facility] ENDOSCOPY;  Service: Cardiovascular;  Laterality: N/A;   CATARACT EXTRACTION     Right 12/2019   CATARACT EXTRACTION     CYSTOSCOPY/RETROGRADE/URETEROSCOPY/STONE EXTRACTION WITH BASKET Right 02/12/2013   Procedure: CYSTOSCOPY/RETROGRADE/URETEROSCOPY/STONE EXTRACTION WITH BASKET, WITH  STENT PLACEMENT RIGHT URETERAL DILATION;  Surgeon: [name], MD;  Location: [facility] ORS;  Service: Urology;  Laterality: Right;   EYE SURGERY Bilateral 1950, 1960, 1964   left, right, left   HERNIA REPAIR  2005   KNEE SURGERY Bilateral 1990, 2000   LITHOTRIPSY     ROTATOR CUFF REPAIR Left 2000   SHOULDER SURGERY Right 04/24/2022   TONSILLECTOMY     Family History  Problem Relation Age of Onset   Stroke Mother    Hypertension Mother    Kidney disease Mother        stones   Nephrolithiasis Brother    Arthritis Paternal Grandmother    Heart disease Paternal Grandmother    Leukemia Father    Heart disease Father    Nephrolithiasis Father    Kidney cancer Son    Appendicitis Son    Heart attack Neg Hx    Colon cancer Neg Hx    Esophageal cancer Neg Hx    Pancreatic cancer Neg Hx    Stomach cancer Neg Hx    Liver disease Neg Hx    Social History   Socioeconomic History   Marital status: Married    Spouse name: Not on file   Number of children: 2   Years of  education: Not on file   Highest education level: Not on file  Occupational History   Occupation: TECHNICAL SUP    Employer: [employer]  Tobacco Use   Smoking status: Never   Smokeless tobacco: Never  Vaping Use   Vaping Use: Never used  Substance and Sexual Activity   Alcohol use: No   Drug use: No   Sexual activity: Yes    Birth control/protection: None  Other Topics Concern   Not on file  Social History Narrative   Non smoker  No ets          Social Determinants of Health   Financial Resource Strain: Low Risk  (05/14/2022)   Overall Financial Resource Strain (CARDIA)    Difficulty of Paying  Living Expenses: Not hard at all  Food Insecurity: No Food Insecurity (05/14/2022)   Hunger Vital Sign    Worried About Running Out of Food in the Last Year: Never true    Ran Out of Food in the Last Year: Never true  Transportation Needs: No Transportation Needs (05/14/2022)   PRAPARE - Hydrologist (Medical): No    Lack of Transportation (Non-Medical): No  Physical Activity: Inactive (05/14/2022)   Exercise Vital Sign    Days of Exercise per Week: 0 days    Minutes of Exercise per Session: 0 min  Stress: No Stress Concern Present (05/14/2022)   Bend    Feeling of Stress : Not at all  Social Connections: [place] (05/08/2021)   Social Connection and Isolation Panel [NHANES]    Frequency of Communication with Friends and Family: Three times a week    Frequency of Social Gatherings with Friends and Family: Three times a week    Attends Religious Services: More than 4 times per year    Active Member of Clubs or Organizations: Yes    Attends Music therapist: More than 4 times per year    Marital Status: Married    Tobacco Counseling Counseling given: Not Answered   Clinical Intake:  Pre-visit preparation completed: Yes  Pain : No/denies pain      Nutritional Status: BMI 25 -29 Overweight Nutritional Risks: None Diabetes: No  How often do you need to have someone help you when you read instructions, pamphlets, or other written materials from your doctor or pharmacy?: 1 - Never  Diabetic? no  Interpreter Needed?: No  Information entered by :: 15yrcollege   Activities of Daily Living    05/14/2022    8:32 AM 05/12/2022   10:44 AM  In your present state of health, do you have any difficulty performing the following activities:  Hearing? 0 0  Comment has hearing aids   Vision? 0 0  Difficulty concentrating or making decisions? 0 0  Walking or climbing stairs? 0 0  Dressing or bathing? 0 0  Doing errands, shopping? 0 0  Preparing Food and eating ? N N  Using the Toilet? N N  In the past six months, have you accidently leaked urine? N N  Do you have problems with loss of bowel control? N N  Managing your Medications? N N  Managing your Finances? N N  Housekeeping or managing your Housekeeping? N N    Patient Care Team: BEulas Post MD as PCP - General (Family Medicine) RSkeet Latch MD as PCP - Cardiology (Cardiology) MJohnathan Hausen MD as Consulting Physician (General Surgery) JMilus Banister MD as Attending Physician (Gastroenterology) WIrine Seal MD as Attending Physician (Urology) PViona Gilmore [facility]as Pharmacist (Pharmacist)  Indicate any recent Medical Services you may have received from other than [facility] providers in the past year (date may be approximate).     Assessment:   [facility]  Hearing/Vision screen Vision Screening - Comments:: Regular eye exams, SMarica Otter Dietary issues and exercise activities discussed: Current Exercise Habits: The patient does not participate in regular exercise at present   Goals Addressed             This Visit's Progress    Patient Stated       05/14/2022, get out of sling and get full mobility of  arm  Depression Screen    05/14/2022    8:32 AM 04/16/2022   10:41 AM 01/16/2022    9:13 AM 05/08/2021    2:35 PM 05/04/2020    2:55 PM 10/11/2016   10:50 AM 10/11/2016   10:39 AM  PHQ 2/9 Scores  PHQ - 2 Score 0 0 1 0 0 0 0  PHQ- 9 Score  0 5  0      Fall Risk    05/14/2022    8:32 AM 05/12/2022   10:44 AM 05/08/2021    2:37 PM 05/04/2020    2:54 PM 08/12/2019   10:14 AM  Fall Risk   Falls in the past year? 0 0 0 0 0  Comment     Emmi Telephone Survey: data to providers prior to load  Number falls in past yr: 0 0 0 0   Injury with Fall? 0 0 0 0   Risk for fall due to : Medication side effect  No Fall Risks Medication side effect   Follow up Falls evaluation completed;Education provided;Falls prevention discussed  Falls evaluation completed Falls evaluation completed;Falls prevention discussed     FALL RISK PREVENTION PERTAINING TO THE HOME:  Any stairs in or around the home? Yes  If so, are there any without handrails? No  Home free of loose throw rugs in walkways, pet beds, electrical cords, etc? Yes  Adequate lighting in your home to reduce risk of falls? Yes   ASSISTIVE DEVICES UTILIZED TO PREVENT FALLS:  Life alert? No  Use of a cane, walker or w/c? No  Grab bars in the bathroom? Yes  Shower chair or bench in shower? No  Elevated toilet seat or a handicapped toilet? Yes   TIMED UP AND GO:  Was the test performed? No .      Cognitive Function:        05/14/2022    8:33 AM  6CIT Screen  What Year? 0 points  What month? 0 points  What time? 0 points  Count back from 20 0 points  Months in reverse 0 points  Repeat phrase 0 points  Total Score 0 points    Immunizations Immunization History  Administered Date(s) Administered   Fluad Quad(high Dose 65+) 05/28/2019, 06/13/2020, 05/29/2021   Influenza Split 07/30/2011, 07/06/2013   Influenza, High Dose Seasonal PF 05/26/2015, 05/18/2016, 06/18/2017, 07/07/2018   Influenza,inj,Quad PF,6+ Mos  06/16/2014   PFIZER(Purple Top)SARS-COV-2 Vaccination 10/22/2019, 11/17/2019, 06/14/2020, 12/30/2020   Pfizer Covid-19 Vaccine Bivalent Booster 50yr & up 06/19/2021   Pneumococcal Conjugate-13 09/17/2010, 08/31/2013   Pneumococcal Polysaccharide-23 04/08/2015   Tdap 11/27/2011   Zoster Recombinat (Shingrix) 06/19/2021   Zoster, Live 09/28/2010    TDAP status: Due, Education has been provided regarding the importance of this vaccine. Advised may receive this vaccine at local pharmacy or Health Dept. Aware to provide a copy of the vaccination record if obtained from local pharmacy or Health Dept. Verbalized acceptance and understanding.  Flu Vaccine status: Due, Education has been provided regarding the importance of this vaccine. Advised may receive this vaccine at local pharmacy or Health Dept. Aware to provide a copy of the vaccination record if obtained from local pharmacy or Health Dept. Verbalized acceptance and understanding.  Pneumococcal vaccine status: Up to date  Covid-19 vaccine status: Completed vaccines  Qualifies for Shingles Vaccine? Yes   Zostavax completed Yes   Shingrix Completed?: needs second dose  Screening Tests Health Maintenance  Topic Date Due   Zoster Vaccines- Shingrix (2 of 2)  08/14/2021   COVID-19 Vaccine (6 - Pfizer series) 10/20/2021   COLONOSCOPY (Pts 45-80yr Insurance coverage will need to be confirmed)  11/06/2021   TETANUS/TDAP  11/26/2021   INFLUENZA VACCINE  04/17/2022   Pneumonia Vaccine 75 Years old  Completed   Hepatitis C Screening  Completed   HPV VACCINES  Aged Out    Health Maintenance  Health Maintenance Due  Topic Date Due   Zoster Vaccines- Shingrix (2 of 2) 08/14/2021   COVID-19 Vaccine (6 - Pfizer series) 10/20/2021   COLONOSCOPY (Pts 45-477yrInsurance coverage will need to be confirmed)  11/06/2021   TETANUS/TDAP  11/26/2021   INFLUENZA VACCINE  04/17/2022    Colorectal cancer screening: Type of screening: Colonoscopy.  Completed 11/07/2011. Repeat every 10 years  Lung Cancer Screening: (Low Dose CT Chest recommended if Age 75-80ears, 30 pack-year currently smoking OR have quit w/in 15years.) does not qualify.   Lung Cancer Screening Referral: no  Additional Screening:  Hepatitis C Screening: does qualify; Completed 10/11/2016  Vision Screening: Recommended annual ophthalmology exams for early detection of glaucoma and other disorders of the eye. Is the patient up to date with their annual eye exam?  Yes  Who is the provider or what is the name of the office in which the patient attends annual eye exams? SaMarica OtterIf pt is not established with a provider, would they like to be referred to a provider to establish care? No .   Dental Screening: Recommended annual dental exams for proper oral hygiene  Community Resource Referral / Chronic Care Management: CRR required this visit?  No   CCM required this visit?  No      Plan:     I have personally reviewed and noted the following in the patient's chart:   Medical and social history Use of alcohol, tobacco or illicit drugs  Current medications and supplements including opioid prescriptions. Patient is not currently taking opioid prescriptions. Functional ability and status Nutritional status Physical activity Advanced directives List of other physicians Hospitalizations, surgeries, and ER visits in previous 12 months Vitals Screenings to include cognitive, depression, and falls Referrals and appointments  In addition, I have reviewed and discussed with patient certain preventive protocols, quality metrics, and best practice recommendations. A written personalized care plan for preventive services as well as general preventive health recommendations were provided to patient.     NiKellie SimmeringLPN   04/24/19/7218 Nurse Notes: none  Due to this being a virtual visit, the after visit summary with patients personalized plan was offered  to patient via mail or my-chart.Patient would like to access on my-chart

## 2022-05-14 NOTE — Patient Instructions (Signed)
Francis Campbell , Thank you for taking time to come for your Medicare Wellness Visit. I appreciate your ongoing commitment to your health goals. Please review the following plan we discussed and let me know if I can assist you in the future.   Screening recommendations/referrals: Colonoscopy: completed 11/07/2011, due now Recommended yearly ophthalmology/optometry visit for glaucoma screening and checkup Recommended yearly dental visit for hygiene and checkup  Vaccinations: Influenza vaccine: due Pneumococcal vaccine: completed 04/08/2015 Tdap vaccine: due Shingles vaccine: needs second dose   Covid-19:  06/19/2021, 12/30/2020, 06/14/2020, 11/17/2019, 10/22/2019  Advanced directives: Please bring a copy of your POA (Power of Attorney) and/or Living Will to your next appointment.   Conditions/risks identified: none  Next appointment: Follow up in one year for your annual wellness visit.   Preventive Care 75 Years and Older, Male Preventive care refers to lifestyle choices and visits with your health care provider that can promote health and wellness. What does preventive care include? A yearly physical exam. This is also called an annual well check. Dental exams once or twice a year. Routine eye exams. Ask your health care provider how often you should have your eyes checked. Personal lifestyle choices, including: Daily care of your teeth and gums. Regular physical activity. Eating a healthy diet. Avoiding tobacco and drug use. Limiting alcohol use. Practicing safe sex. Taking low doses of aspirin every day. Taking vitamin and mineral supplements as recommended by your health care provider. What happens during an annual well check? The services and screenings done by your health care provider during your annual well check will depend on your age, overall health, lifestyle risk factors, and family history of disease. Counseling  Your health care provider may ask you questions about  your: Alcohol use. Tobacco use. Drug use. Emotional well-being. Home and relationship well-being. Sexual activity. Eating habits. History of falls. Memory and ability to understand (cognition). Work and work Statistician. Screening  You may have the following tests or measurements: Height, weight, and BMI. Blood pressure. Lipid and cholesterol levels. These may be checked every 5 years, or more frequently if you are over 35 years old. Skin check. Lung cancer screening. You may have this screening every year starting at age 39 if you have a 30-pack-year history of smoking and currently smoke or have quit within the past 15 years. Fecal occult blood test (FOBT) of the stool. You may have this test every year starting at age 26. Flexible sigmoidoscopy or colonoscopy. You may have a sigmoidoscopy every 5 years or a colonoscopy every 10 years starting at age 71. Prostate cancer screening. Recommendations will vary depending on your family history and other risks. Hepatitis C blood test. Hepatitis B blood test. Sexually transmitted disease (STD) testing. Diabetes screening. This is done by checking your blood sugar (glucose) after you have not eaten for a while (fasting). You may have this done every 1-3 years. Abdominal aortic aneurysm (AAA) screening. You may need this if you are a current or former smoker. Osteoporosis. You may be screened starting at age 80 if you are at high risk. Talk with your health care provider about your test results, treatment options, and if necessary, the need for more tests. Vaccines  Your health care provider may recommend certain vaccines, such as: Influenza vaccine. This is recommended every year. Tetanus, diphtheria, and acellular pertussis (Tdap, Td) vaccine. You may need a Td booster every 10 years. Zoster vaccine. You may need this after age 53. Pneumococcal 13-valent conjugate (PCV13) vaccine. One dose  is recommended after age 70. Pneumococcal  polysaccharide (PPSV23) vaccine. One dose is recommended after age 75. Talk to your health care provider about which screenings and vaccines you need and how often you need them. This information is not intended to replace advice given to you by your health care provider. Make sure you discuss any questions you have with your health care provider. Document Released: 09/30/2015 Document Revised: 05/23/2016 Document Reviewed: 07/05/2015 Elsevier Interactive Patient Education  2017 [place] Prevention in the Home Falls can cause injuries. They can happen to people of all ages. There are many things you can do to make your home safe and to help prevent falls. What can I do on the outside of my home? Regularly fix the edges of walkways and driveways and fix any cracks. Remove anything that might make you trip as you walk through a door, such as a raised step or threshold. Trim any bushes or trees on the path to your home. Use bright outdoor lighting. Clear any walking paths of anything that might make someone trip, such as rocks or tools. Regularly check to see if handrails are loose or broken. Make sure that both sides of any steps have handrails. Any raised decks and porches should have guardrails on the edges. Have any leaves, snow, or ice cleared regularly. Use sand or salt on walking paths during winter. Clean up any spills in your garage right away. This includes oil or grease spills. What can I do in the bathroom? Use night lights. Install grab bars by the toilet and in the tub and shower. Do not use towel bars as grab bars. Use non-skid mats or decals in the tub or shower. If you need to sit down in the shower, use a plastic, non-slip stool. Keep the floor dry. Clean up any water that spills on the floor as soon as it happens. Remove soap buildup in the tub or shower regularly. Attach bath mats securely with double-sided non-slip rug tape. Do not have throw rugs and other  things on the floor that can make you trip. What can I do in the bedroom? Use night lights. Make sure that you have a light by your bed that is easy to reach. Do not use any sheets or blankets that are too big for your bed. They should not hang down onto the floor. Have a firm chair that has side arms. You can use this for support while you get dressed. Do not have throw rugs and other things on the floor that can make you trip. What can I do in the kitchen? Clean up any spills right away. Avoid walking on wet floors. Keep items that you use a lot in easy-to-reach places. If you need to reach something above you, use a strong step stool that has a grab bar. Keep electrical cords out of the way. Do not use floor polish or wax that makes floors slippery. If you must use wax, use non-skid floor wax. Do not have throw rugs and other things on the floor that can make you trip. What can I do with my stairs? Do not leave any items on the stairs. Make sure that there are handrails on both sides of the stairs and use them. Fix handrails that are broken or loose. Make sure that handrails are as long as the stairways. Check any carpeting to make sure that it is firmly attached to the stairs. Fix any carpet that is loose or worn. Avoid  having throw rugs at the top or bottom of the stairs. If you do have throw rugs, attach them to the floor with carpet tape. Make sure that you have a light switch at the top of the stairs and the bottom of the stairs. If you do not have them, ask someone to add them for you. What else can I do to help prevent falls? Wear shoes that: Do not have high heels. Have rubber bottoms. Are comfortable and fit you well. Are closed at the toe. Do not wear sandals. If you use a stepladder: Make sure that it is fully opened. Do not climb a closed stepladder. Make sure that both sides of the stepladder are locked into place. Ask someone to hold it for you, if possible. Clearly  mark and make sure that you can see: Any grab bars or handrails. First and last steps. Where the edge of each step is. Use tools that help you move around (mobility aids) if they are needed. These include: Canes. Walkers. Scooters. Crutches. Turn on the lights when you go into a dark area. Replace any light bulbs as soon as they burn out. Set up your furniture so you have a clear path. Avoid moving your furniture around. If any of your floors are uneven, fix them. If there are any pets around you, be aware of where they are. Review your medicines with your doctor. Some medicines can make you feel dizzy. This can increase your chance of falling. Ask your doctor what other things that you can do to help prevent falls. This information is not intended to replace advice given to you by your health care provider. Make sure you discuss any questions you have with your health care provider. Document Released: 06/30/2009 Document Revised: 02/09/2016 Document Reviewed: 10/08/2014 Elsevier Interactive Patient Education  2017 [employer].

## 2022-05-17 DIAGNOSIS — M25511 Pain in right shoulder: Secondary | ICD-10-CM | POA: Diagnosis not present

## 2022-05-17 DIAGNOSIS — M25611 Stiffness of right shoulder, not elsewhere classified: Secondary | ICD-10-CM | POA: Diagnosis not present

## 2022-05-22 ENCOUNTER — Encounter (HOSPITAL_BASED_OUTPATIENT_CLINIC_OR_DEPARTMENT_OTHER): Payer: Self-pay | Admitting: Cardiovascular Disease

## 2022-05-22 MED ORDER — METOPROLOL SUCCINATE ER 25 MG PO TB24: ORAL_TABLET | ORAL | 3 refills | Status: DC

## 2022-05-23 ENCOUNTER — Other Ambulatory Visit (HOSPITAL_COMMUNITY): Payer: Self-pay

## 2022-05-24 DIAGNOSIS — M25511 Pain in right shoulder: Secondary | ICD-10-CM | POA: Diagnosis not present

## 2022-05-24 DIAGNOSIS — M25611 Stiffness of right shoulder, not elsewhere classified: Secondary | ICD-10-CM | POA: Diagnosis not present

## 2022-05-25 ENCOUNTER — Other Ambulatory Visit (HOSPITAL_COMMUNITY): Payer: Self-pay

## 2022-05-25 MED ORDER — METOPROLOL SUCCINATE ER 25 MG PO TB24
ORAL_TABLET | ORAL | 1 refills | Status: DC
  Filled 2022-05-25 – 2022-11-19 (×2): qty 90, 90d supply, fill #0

## 2022-05-29 ENCOUNTER — Other Ambulatory Visit: Payer: Self-pay | Admitting: Family Medicine

## 2022-05-29 DIAGNOSIS — M25511 Pain in right shoulder: Secondary | ICD-10-CM | POA: Diagnosis not present

## 2022-05-29 DIAGNOSIS — M25611 Stiffness of right shoulder, not elsewhere classified: Secondary | ICD-10-CM | POA: Diagnosis not present

## 2022-05-31 ENCOUNTER — Ambulatory Visit (INDEPENDENT_AMBULATORY_CARE_PROVIDER_SITE_OTHER): Payer: PPO

## 2022-05-31 ENCOUNTER — Other Ambulatory Visit (HOSPITAL_COMMUNITY): Payer: Self-pay

## 2022-05-31 DIAGNOSIS — I7121 Aneurysm of the ascending aorta, without rupture: Secondary | ICD-10-CM | POA: Diagnosis not present

## 2022-05-31 LAB — ECHOCARDIOGRAM COMPLETE
Area-P 1/2: 3.12 cm2
P 1/2 time: 959 msec
S' Lateral: 2.48 cm

## 2022-06-01 ENCOUNTER — Other Ambulatory Visit: Payer: Self-pay | Admitting: Family Medicine

## 2022-06-01 DIAGNOSIS — M25611 Stiffness of right shoulder, not elsewhere classified: Secondary | ICD-10-CM | POA: Diagnosis not present

## 2022-06-01 DIAGNOSIS — M25511 Pain in right shoulder: Secondary | ICD-10-CM | POA: Diagnosis not present

## 2022-06-05 ENCOUNTER — Ambulatory Visit: Payer: PPO | Admitting: Family Medicine

## 2022-06-05 ENCOUNTER — Ambulatory Visit (INDEPENDENT_AMBULATORY_CARE_PROVIDER_SITE_OTHER): Payer: PPO | Admitting: Family Medicine

## 2022-06-05 ENCOUNTER — Ambulatory Visit (INDEPENDENT_AMBULATORY_CARE_PROVIDER_SITE_OTHER): Payer: PPO

## 2022-06-05 ENCOUNTER — Encounter: Payer: Self-pay | Admitting: Family Medicine

## 2022-06-05 ENCOUNTER — Other Ambulatory Visit (HOSPITAL_COMMUNITY): Payer: Self-pay

## 2022-06-05 VITALS — BP 116/60 | HR 67 | Temp 98.0°F | Ht 73.0 in | Wt 198.6 lb

## 2022-06-05 DIAGNOSIS — M25611 Stiffness of right shoulder, not elsewhere classified: Secondary | ICD-10-CM | POA: Diagnosis not present

## 2022-06-05 DIAGNOSIS — R053 Chronic cough: Secondary | ICD-10-CM | POA: Diagnosis not present

## 2022-06-05 DIAGNOSIS — Z23 Encounter for immunization: Secondary | ICD-10-CM | POA: Diagnosis not present

## 2022-06-05 DIAGNOSIS — M25511 Pain in right shoulder: Secondary | ICD-10-CM | POA: Diagnosis not present

## 2022-06-05 MED FILL — Warfarin Sodium Tab 5 MG: ORAL | 90 days supply | Qty: 135 | Fill #0 | Status: AC

## 2022-06-05 NOTE — Progress Notes (Signed)
Established Patient Office Visit  Subjective   Patient ID: Francis Campbell., male    DOB: [birth year]  Age: 76 y.o. MRN: [id number]  Chief Complaint  Patient presents with   Cough    Patient complains of cough, x2 months, Non productive     HPI   Francis Campbell is seen with 6 to 8-week history of dry cough.  He states that he has paroxysms of cough which can last for several minutes and then resolves.  No associated wheezing.  No hemoptysis.  No appetite or weight changes.  Denies any recent postnasal drip symptoms.  He has history of intermittent reflux but no consistent active reflux symptoms. Non-smoker.  Does not take ACE inhibitor but does take low-dose losartan.  He had recent echocardiogram with EF 65 to 70%.    He had recent right rotator cuff surgery and is doing well with that.  He thinks his cough started shortly after the surgery.  He has never had any associated fever.  No dyspnea.  No pleuritic pain.  Past Medical History:  Diagnosis Date   Allergy    Aortic regurgitation    Arthritis    Ascending aortic aneurysm ([place]) 04/30/2022   Asthma    only as child   Atrial flutter ([place])    a. Dx 09/2012.   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   TIA (transient ischemic attack)    ~2005 - had visual symptoms, was told by [name] this may have been a TIA but it's unclear   Valvular heart disease    Mild AI, ASclerosis without AS by echo 10/2011   Past Surgical History:  Procedure Laterality Date   CARDIOVERSION N/A 05/08/2013   Procedure: CARDIOVERSION;  Surgeon: Darlin Coco, MD;  Location: [facility] ENDOSCOPY;  Service: Cardiovascular;  Laterality: N/A;   CATARACT EXTRACTION     Right 12/2019   CATARACT EXTRACTION     CYSTOSCOPY/RETROGRADE/URETEROSCOPY/STONE EXTRACTION WITH  BASKET Right 02/12/2013   Procedure: CYSTOSCOPY/RETROGRADE/URETEROSCOPY/STONE EXTRACTION WITH BASKET, WITH  STENT PLACEMENT RIGHT URETERAL DILATION;  Surgeon: [name], MD;  Location: [facility] ORS;  Service: Urology;  Laterality: Right;   EYE SURGERY Bilateral 1950, 1960, 1964   left, right, left   HERNIA REPAIR  2005   KNEE SURGERY Bilateral 1990, 2000   LITHOTRIPSY     ROTATOR CUFF REPAIR Left 2000   SHOULDER SURGERY Right 04/24/2022   TONSILLECTOMY      reports that he has never smoked. He has never used smokeless tobacco. He reports that he does not drink alcohol and does not use drugs. family history includes Appendicitis in his son; Arthritis in his paternal grandmother; Heart disease in his father and paternal grandmother; Hypertension in his mother; Kidney cancer in his son; Kidney disease in his mother; Leukemia in his father; Nephrolithiasis in his brother and father; Stroke in his mother. Allergies  Allergen Reactions   Chocolate Palpitations   Chocolate Flavor Shortness Of Breath    Other reaction(s): Respiratory Distress   Dextroamphetamine Shortness Of Breath    Other reaction(s): Respiratory Distress   Dextromethorphan Palpitations   Flomax [Tamsulosin Hcl] Other (See Comments)    ? Causes A Fib   Floxin [Ofloxacin] Other (See Comments)  hallucinations   Guaifenesin Shortness Of Breath   Lisinopril Cough   Tavist Allergy [Clemastine Fumarate] Other (See Comments)    A fib worse   Clemastine     Other reaction(s): Irregular Heart Rate   Robitussin Cold+Flu Daytime  [Dm-Phenylephrine-Acetaminophen]     Other reaction(s): Respiratory Distress   Tamsulosin Other (See Comments)    Other reaction(s): Irregular Heart Rate   Amiodarone Other (See Comments)    Abn  thyroid   Robitussin Cf [Pseudoephedrine-Dm-Gg] Palpitations    Review of Systems  Constitutional:  Negative for chills, fever and weight loss.  Respiratory:  Positive for cough. Negative for hemoptysis,  sputum production, shortness of breath and wheezing.   Cardiovascular:  Negative for chest pain and leg swelling.      Objective:     BP 116/60 (BP Location: Left Arm, Patient Position: Sitting, Cuff Size: Normal)   Pulse 67   Temp 98 F (36.7 C) (Oral)   Ht 6' 1"  (1.854 m)   Wt 198 lb 9.6 oz (90.1 kg)   SpO2 98%   BMI 26.20 kg/m    Physical Exam Vitals reviewed.  Constitutional:      Appearance: Normal appearance.  Cardiovascular:     Rate and Rhythm: Normal rate and regular rhythm.  Pulmonary:     Effort: Pulmonary effort is normal. No respiratory distress.     Breath sounds: Normal breath sounds. No wheezing or rales.  Musculoskeletal:     Cervical back: Neck supple.  Neurological:     Mental Status: He is alert.      No results found for any visits on 06/05/22.    The 10-year ASCVD risk score (Arnett DK, et al., 2019) is: 24%    Assessment & Plan:   Problem List Items Addressed This Visit   None Visit Diagnoses     Chronic cough    -  Primary   Relevant Orders   DG Chest 2 View   Need for influenza vaccination       Relevant Orders   Flu Vaccine QUAD High Dose(Fluad) (Completed)     Patient is seen with almost 23-monthhistory of intermittent dry cough.  Etiology unclear.  No evidence for reactive airway changes on exam.  Does have history of occasional GERD but no consistent symptoms.  No postnasal drip symptoms.  Does not any red flags such as appetite change or weight loss or fever.  -Hold losartan 25 mg daily for the next couple weeks to see if cough improves.  His blood pressure is already well controlled and we explained that even though ARB's are not frequently associated with cough can be associated -If cough persist after the above consider trial of over-the-counter Prilosec or Nexium 20 mg daily -Given duration of cough chest x-ray obtained.  No follow-ups on file.    BCarolann Littler MD

## 2022-06-05 NOTE — Patient Instructions (Signed)
Consider holding the Losartan for couple of weeks if CXR is normal.    Consider OTC Nexium or Prilosec 20 mg once daily.

## 2022-06-08 DIAGNOSIS — M25611 Stiffness of right shoulder, not elsewhere classified: Secondary | ICD-10-CM | POA: Diagnosis not present

## 2022-06-08 DIAGNOSIS — M25511 Pain in right shoulder: Secondary | ICD-10-CM | POA: Diagnosis not present

## 2022-06-11 DIAGNOSIS — M25611 Stiffness of right shoulder, not elsewhere classified: Secondary | ICD-10-CM | POA: Diagnosis not present

## 2022-06-11 DIAGNOSIS — M25511 Pain in right shoulder: Secondary | ICD-10-CM | POA: Diagnosis not present

## 2022-06-13 ENCOUNTER — Ambulatory Visit: Payer: PPO | Attending: Cardiovascular Disease

## 2022-06-13 DIAGNOSIS — Z5181 Encounter for therapeutic drug level monitoring: Secondary | ICD-10-CM | POA: Diagnosis not present

## 2022-06-13 DIAGNOSIS — I480 Paroxysmal atrial fibrillation: Secondary | ICD-10-CM | POA: Diagnosis not present

## 2022-06-13 LAB — POCT INR: INR: 3.4 — AB (ref 2.0–3.0)

## 2022-06-13 NOTE — Patient Instructions (Signed)
Description   Hold tomorrow's dose and then continue taking 93m daily except 7.544mevery Mondays and Fridays. Recheck INR in 4 weeks. [facility] [phone]

## 2022-06-15 DIAGNOSIS — M25611 Stiffness of right shoulder, not elsewhere classified: Secondary | ICD-10-CM | POA: Diagnosis not present

## 2022-06-15 DIAGNOSIS — M25511 Pain in right shoulder: Secondary | ICD-10-CM | POA: Diagnosis not present

## 2022-06-18 ENCOUNTER — Other Ambulatory Visit (HOSPITAL_COMMUNITY): Payer: Self-pay

## 2022-06-18 ENCOUNTER — Other Ambulatory Visit: Payer: Self-pay | Admitting: Family Medicine

## 2022-06-18 DIAGNOSIS — M25611 Stiffness of right shoulder, not elsewhere classified: Secondary | ICD-10-CM | POA: Diagnosis not present

## 2022-06-18 DIAGNOSIS — M25511 Pain in right shoulder: Secondary | ICD-10-CM | POA: Diagnosis not present

## 2022-06-18 NOTE — Telephone Encounter (Signed)
Last refill-12/19/21--30 tabs, 5 refills Last OV-06/05/22   No future OV scheduled.

## 2022-06-21 DIAGNOSIS — M25511 Pain in right shoulder: Secondary | ICD-10-CM | POA: Diagnosis not present

## 2022-06-21 DIAGNOSIS — M25611 Stiffness of right shoulder, not elsewhere classified: Secondary | ICD-10-CM | POA: Diagnosis not present

## 2022-06-25 DIAGNOSIS — M25611 Stiffness of right shoulder, not elsewhere classified: Secondary | ICD-10-CM | POA: Diagnosis not present

## 2022-06-25 DIAGNOSIS — M25511 Pain in right shoulder: Secondary | ICD-10-CM | POA: Diagnosis not present

## 2022-06-28 DIAGNOSIS — M25511 Pain in right shoulder: Secondary | ICD-10-CM | POA: Diagnosis not present

## 2022-06-28 DIAGNOSIS — M25611 Stiffness of right shoulder, not elsewhere classified: Secondary | ICD-10-CM | POA: Diagnosis not present

## 2022-06-29 ENCOUNTER — Encounter (HOSPITAL_BASED_OUTPATIENT_CLINIC_OR_DEPARTMENT_OTHER): Payer: Self-pay | Admitting: Cardiovascular Disease

## 2022-07-02 ENCOUNTER — Other Ambulatory Visit (HOSPITAL_COMMUNITY): Payer: Self-pay

## 2022-07-02 DIAGNOSIS — M25511 Pain in right shoulder: Secondary | ICD-10-CM | POA: Diagnosis not present

## 2022-07-02 DIAGNOSIS — M25611 Stiffness of right shoulder, not elsewhere classified: Secondary | ICD-10-CM | POA: Diagnosis not present

## 2022-07-02 MED FILL — Losartan Potassium Tab 25 MG: ORAL | 90 days supply | Qty: 90 | Fill #0 | Status: AC

## 2022-07-03 ENCOUNTER — Other Ambulatory Visit (HOSPITAL_COMMUNITY): Payer: Self-pay

## 2022-07-04 DIAGNOSIS — E7800 Pure hypercholesterolemia, unspecified: Secondary | ICD-10-CM | POA: Diagnosis not present

## 2022-07-05 LAB — HEPATIC FUNCTION PANEL
ALT: 41 IU/L (ref 0–44)
AST: 36 IU/L (ref 0–40)
Albumin: 4.5 g/dL (ref 3.8–4.8)
Alkaline Phosphatase: 87 IU/L (ref 44–121)
Bilirubin Total: 0.3 mg/dL (ref 0.0–1.2)
Bilirubin, Direct: 0.11 mg/dL (ref 0.00–0.40)
Total Protein: 6.8 g/dL (ref 6.0–8.5)

## 2022-07-05 LAB — LIPID PANEL
Chol/HDL Ratio: 3.7 ratio (ref 0.0–5.0)
Cholesterol, Total: 144 mg/dL (ref 100–199)
HDL: 39 mg/dL — ABNORMAL LOW (ref >39)
LDL Chol Calc (NIH): 76 mg/dL (ref 0–99)
Triglycerides: 168 mg/dL — ABNORMAL HIGH (ref 0–149)
VLDL Cholesterol Cal: 29 mg/dL (ref 5–40)

## 2022-07-06 DIAGNOSIS — M25611 Stiffness of right shoulder, not elsewhere classified: Secondary | ICD-10-CM | POA: Diagnosis not present

## 2022-07-06 DIAGNOSIS — M25511 Pain in right shoulder: Secondary | ICD-10-CM | POA: Diagnosis not present

## 2022-07-11 ENCOUNTER — Ambulatory Visit: Payer: PPO | Attending: Cardiovascular Disease | Admitting: *Deleted

## 2022-07-11 DIAGNOSIS — Z5181 Encounter for therapeutic drug level monitoring: Secondary | ICD-10-CM | POA: Diagnosis not present

## 2022-07-11 DIAGNOSIS — I480 Paroxysmal atrial fibrillation: Secondary | ICD-10-CM | POA: Diagnosis not present

## 2022-07-11 LAB — POCT INR: INR: 3.2 — AB (ref 2.0–3.0)

## 2022-07-11 NOTE — Patient Instructions (Addendum)
Description   Hold tomorrow's dose (already today's dose) and then start taking 65m daily except 7.525mevery Fridays. Recheck INR in 4 weeks. [facility] [phone]

## 2022-07-12 DIAGNOSIS — M25611 Stiffness of right shoulder, not elsewhere classified: Secondary | ICD-10-CM | POA: Diagnosis not present

## 2022-07-12 DIAGNOSIS — M25511 Pain in right shoulder: Secondary | ICD-10-CM | POA: Diagnosis not present

## 2022-07-19 DIAGNOSIS — M25511 Pain in right shoulder: Secondary | ICD-10-CM | POA: Diagnosis not present

## 2022-07-19 DIAGNOSIS — H6123 Impacted cerumen, bilateral: Secondary | ICD-10-CM | POA: Diagnosis not present

## 2022-07-19 DIAGNOSIS — M25611 Stiffness of right shoulder, not elsewhere classified: Secondary | ICD-10-CM | POA: Diagnosis not present

## 2022-07-23 ENCOUNTER — Telehealth: Payer: Self-pay | Admitting: Family Medicine

## 2022-07-23 MED ORDER — ALPRAZOLAM 0.25 MG PO TABS: ORAL_TABLET | ORAL | 5 refills | Status: DC

## 2022-07-23 NOTE — Telephone Encounter (Signed)
Requesting refill for ALPRAZolam (XANAX) 0.25 MG tablet switched over to  [id number] [place], [place] - [address] [place] [phone] Fax: [phone]    He has 3 tablets

## 2022-07-23 NOTE — Telephone Encounter (Signed)
Noted  

## 2022-07-23 NOTE — Telephone Encounter (Signed)
Refills of alprazolam sent to new pharmacy  [name] MD [facility]

## 2022-07-27 DIAGNOSIS — M25511 Pain in right shoulder: Secondary | ICD-10-CM | POA: Diagnosis not present

## 2022-07-27 DIAGNOSIS — M25611 Stiffness of right shoulder, not elsewhere classified: Secondary | ICD-10-CM | POA: Diagnosis not present

## 2022-08-03 DIAGNOSIS — M25511 Pain in right shoulder: Secondary | ICD-10-CM | POA: Diagnosis not present

## 2022-08-03 DIAGNOSIS — M25611 Stiffness of right shoulder, not elsewhere classified: Secondary | ICD-10-CM | POA: Diagnosis not present

## 2022-08-07 DIAGNOSIS — M25611 Stiffness of right shoulder, not elsewhere classified: Secondary | ICD-10-CM | POA: Diagnosis not present

## 2022-08-07 DIAGNOSIS — M25511 Pain in right shoulder: Secondary | ICD-10-CM | POA: Diagnosis not present

## 2022-08-08 ENCOUNTER — Ambulatory Visit: Payer: PPO | Attending: Cardiology

## 2022-08-08 DIAGNOSIS — Z5181 Encounter for therapeutic drug level monitoring: Secondary | ICD-10-CM

## 2022-08-08 DIAGNOSIS — I480 Paroxysmal atrial fibrillation: Secondary | ICD-10-CM

## 2022-08-08 LAB — POCT INR: INR: 2.7 (ref 2.0–3.0)

## 2022-08-08 NOTE — Patient Instructions (Signed)
Description   Continue taking 61m daily except 7.570mevery Fridays.  Recheck INR in 6 weeks. [facility] [phone]

## 2022-08-14 DIAGNOSIS — M25611 Stiffness of right shoulder, not elsewhere classified: Secondary | ICD-10-CM | POA: Diagnosis not present

## 2022-08-14 DIAGNOSIS — M25511 Pain in right shoulder: Secondary | ICD-10-CM | POA: Diagnosis not present

## 2022-08-15 DIAGNOSIS — Z4789 Encounter for other orthopedic aftercare: Secondary | ICD-10-CM | POA: Diagnosis not present

## 2022-08-24 ENCOUNTER — Telehealth: Payer: Self-pay | Admitting: Pharmacist

## 2022-08-24 NOTE — Chronic Care Management (AMB) (Signed)
    Chronic Care Management Pharmacy Assistant   Name: Francis Campbell.  MRN: [id number] DOB: 1945-10-22  08/24/22 APPOINTMENT REMINDER  Patient was reminded to have all medications, supplements and any blood glucose and blood pressure readings available for review with Jeni Salles, Pharm. D, for telephone visit on 08/27/22 at 1:30.    Care Gaps: Zoster Vaccine - Overdue Colonoscopy - Overdue DAP - Overdue COVID Booster - Overdue AWV- 05/14/22  Star Rating Drug: Losartan 25 mg - Last filled 07/03/22 90 at [facility]     Medications: Outpatient Encounter Medications as of 08/24/2022  Medication Sig   ALPRAZolam (XANAX) 0.25 MG tablet Take 1 tablet by mouth at night as needed for insomnia   Cholecalciferol (VITAMIN D3) 1000 UNITS CAPS Take 2,000 Units by mouth every morning.   diltiazem (CARDIZEM CD) 240 MG 24 hr capsule TAKE 1 CAPSULE BY MOUTH EVERY MORNING   flecainide (TAMBOCOR) 100 MG tablet Take 1 tablet (100 mg total) by mouth 2 (two) times daily.   losartan (COZAAR) 25 MG tablet TAKE 1 TABLET BY MOUTH EVERY DAY   metoprolol succinate (TOPROL-XL) 25 MG 24 hr tablet TAKE 1 TABLET EVERY DAY WITH OR IMMEDIATELY FOLLOWING A MEAL   metoprolol succinate (TOPROL-XL) 25 MG 24 hr tablet Take 1 tablet by mouth daily with or immediately after a meal   Multiple Vitamin (MULTIVITAMIN) tablet Take 1 tablet by mouth daily.   neomycin-polymyxin-hydrocortisone (CORTISPORIN) OTIC solution Use 4 drops into both ears three times daily   potassium citrate (UROCIT-K) 10 MEQ (1080 MG) SR tablet TAKE 1 TABLET BY MOUTH THREE TIMES DAILY   rosuvastatin (CRESTOR) 40 MG tablet Take 1 tablet (40 mg total) by mouth daily.   sertraline (ZOLOFT) 50 MG tablet TAKE 1 TABLET BY MOUTH EVERY DAY   warfarin (COUMADIN) 5 MG tablet TAKE 1 TO 1 AND 1/2 TABLETS DAILY AS DIRECTED BY COUMADIN CLINIC   No facility-administered encounter medications on file as of 08/24/2022.      [place] Clinical  Pharmacist Assistant [phone]

## 2022-08-27 ENCOUNTER — Ambulatory Visit (INDEPENDENT_AMBULATORY_CARE_PROVIDER_SITE_OTHER): Payer: Medicare Other | Admitting: Pharmacist

## 2022-08-27 DIAGNOSIS — I480 Paroxysmal atrial fibrillation: Secondary | ICD-10-CM

## 2022-08-27 DIAGNOSIS — E7800 Pure hypercholesterolemia, unspecified: Secondary | ICD-10-CM

## 2022-08-27 NOTE — Patient Instructions (Signed)
Hi Francis Campbell,  It was great to catch up again! Please try to work on checking your blood pressure more regularly like we discussed.  Please reach out to me if you have any questions or need anything before our follow up!  Best, Maddie  Jeni Salles, PharmD, [place] [facility]   Visit Information   Goals Addressed   None    Patient Care Plan: CCM Pharmacy Care Plan     Problem Identified: Problem: Hypertension, Hyperlipidemia, Atrial Fibrillation, Depression, Anxiety and Insomnia      Long-Range Goal: Patient-Specific Goal   Start Date: 02/17/2021  Expected End Date: 02/17/2022  Recent Progress: On track  Priority: High  Note:   Current Barriers:  Unable to independently monitor therapeutic efficacy  Pharmacist Clinical Goal(s):  Patient will achieve adherence to monitoring guidelines and medication adherence to achieve therapeutic efficacy through collaboration with PharmD and provider.   Interventions: 1:1 collaboration with [name], MD regarding development and update of comprehensive plan of care as evidenced by provider attestation and co-signature Inter-disciplinary care team collaboration (see longitudinal plan of care) Comprehensive medication review performed; medication list updated in electronic medical record  Hypertension (BP goal <140/90) -Controlled -Current treatment: Losartan 25 mg 1 tablet daily  - Appropriate, Effective, Safe, Accessible Diltiazem 240 mg 1 capsule in the morning - Appropriate, Effective, Safe, Accessible Metoprolol succinate 25 mg 1 tablet daily - in AM - Appropriate, Effective, Safe, Accessible -Medications previously tried: none  -Current home readings: 141/74 (checking not often)  -Current dietary habits: did not discuss -Current exercise habits: did not discuss -Denies hypotensive/hypertensive symptoms -Educated on BP goals and benefits of medications for prevention of  heart attack, stroke and kidney damage; Exercise goal of 150 minutes per week; Importance of home blood pressure monitoring; Proper BP monitoring technique; -Counseled to monitor BP at home weekly, document, and provide log at future appointments -Counseled on diet and exercise extensively Recommended to continue current medication  Hyperlipidemia: (LDL goal < 100) -Controlled -Current treatment: Rosuvastatin 20 mg 1 tablet daily - Appropriate, Effective, Safe, Accessible -Medications previously tried: none  -Current dietary patterns: did not discuss -Current exercise habits: did not discuss -Educated on Cholesterol goals;  Importance of limiting foods high in cholesterol; Exercise goal of 150 minutes per week; -Counseled on diet and exercise extensively Recommended to continue current medication Recommended repeat lipid panel with cardiology.  Atrial Fibrillation (Goal: prevent stroke and major bleeding) -Controlled -CHADSVASC: 2 -Current treatment: Rate/rhythm control: Diltiazem 240 mg 1 capsule in the morning; Metoprolol succinate 25 mg 1 tablet daily in morning; Flecainide 100 mg 1 tablet twice daily - Appropriate, Effective, Safe, Accessible Anticoagulation: Warfarin 5 mg, 7.5 mg on Mon, Wed and Fri and 5 mg on all other days - Appropriate, Effective, Safe, Accessible -Medications previously tried: none -Home BP and HR readings: 70s (checking once a month) -Counseled on importance of adherence to anticoagulant exactly as prescribed; avoidance of NSAIDs due to increased bleeding risk with anticoagulants; -Counseled on diet and exercise extensively Recommended to continue current medication  Depression/Anxiety (Goal: minimize symptoms) -Controlled -Current treatment: Sertraline 50 mg 1 tablet daily - Appropriate, Effective, Safe, Accessible -Medications previously tried/failed: none -PHQ9: 0 -GAD7: n/a -Educated on Benefits of medication for symptom control -Recommended  to continue current medication  Insomnia (Goal: improve quality and quantity of sleep) -Not ideally controlled -Current treatment  Alprazolam 0.25 mg 1 tablet at bedtime as needed - Appropriate, Effective, Safe, Accessible -Medications previously tried: none  -  Counseled on long term risks of taking benzodiazepines such as increased risk for bone fractures and memory loss - patient reports taking almost every night  BPH (Goal: minimize symptoms) -Controlled -Current treatment  No medications -Medications previously tried: Alfuzosin (flare up)  -Recommended to continue as is  Health Maintenance -Vaccine gaps: shingrix (2nd dose) -Current therapy:  Vitamin D 2000 units 1 capsule daily Multivitamin 1 tablet daily Potassium citrate 10 mEq 1 tablet 3 times daily (urology) -Educated on Cost vs benefit of each product must be carefully weighed by individual consumer -Patient is satisfied with current therapy and denies issues -Recommended to continue current medication  Patient Goals/Self-Care Activities Patient will:  - check blood pressure weekly, document, and provide at future appointments target a minimum of 150 minutes of moderate intensity exercise weekly  Follow Up Plan: Telephone follow up appointment with care management team member scheduled for: 1 year       Patient verbalizes understanding of instructions and care plan provided today and agrees to view in [place]. Active MyChart status and patient understanding of how to access instructions and care plan via MyChart confirmed with patient.    Telephone follow up appointment with pharmacy team member scheduled for: 1 year  Viona Gilmore, [facility]

## 2022-08-27 NOTE — Progress Notes (Signed)
Chronic Care Management Pharmacy Note  08/27/2022 Name:  Francis Campbell. MRN:  [id number] DOB:  [birth year]  Summary: BP at goal <140/90 per home and office readings Pt is walking more routinely  Recommendations/Changes made from today's visit: -Recommend routine BP monitoring at home  Plan: BP assessment in 3-4 months  Subjective: Francis Campbell. is an 76 y.o. year old male who is a primary patient of Burchette, Alinda Sierras, MD.  The CCM team was consulted for assistance with disease management and care coordination needs.    Engaged with patient by telephone for follow up visit in response to provider referral for pharmacy case management and/or care coordination services.   Consent to Services:  The patient was given information about Chronic Care Management services, agreed to services, and gave verbal consent prior to initiation of services.  Please see initial visit note for detailed documentation.   Patient Care Team: [name], MD as PCP - General (Family Medicine) Skeet Latch, MD as PCP - Cardiology (Cardiology) Johnathan Hausen, MD as Consulting Physician (General Surgery) Milus Banister, MD as Attending Physician (Gastroenterology) Irine Seal, MD as Attending Physician (Urology) Viona Gilmore, [facility] as Pharmacist (Pharmacist)  Recent office visits: 06/05/22 [name], MD: Patient presented for chronic cough. Held losartan for a couple of weeks to see if cough improves.  Consider OTC Nexium or Prilosec if cough continues.  05/14/22 Glenna Durand, LPN: Patient presented for AWV.  04/16/22 [name], MD: Patient presented for pre-op exam.   Recent consult visits: 07/19/22 Lisette Abu, RN (cardiology): Patient presented for anti-coag visit. INR 2.7, goal 2-3. Continued warfarin 7.5 mg (5 mg x 1.5) every Fri; 5 mg (5 mg x 1) all other days.   08/03/22 [place] [facility]): Patient presented for pain of right shoulder joint.  Unable to access notes.  07/19/22 Pietro Cassis, PA-C (otolaryngology): Patient presented for cerumen impaction removal.  04/30/22 Skeet Latch, MD (Cardiology) - Patient presented for aneurysm of ascending aorta without rupture follow up. Recommend increase rosuvastatin to 40 mg daily with repeat FLP/LFT in 2 months. Follow up in 6 months.  03/29/22 Pietro Cassis, PA-C (otolaryngology): Patient presented for cerumen impaction removal.  03/16/22 Strader, Rometta Emery, PA ([facility]) - Patient presented for Right Shoulder Pain with possible rotator cuff tear. No medication changes   03/01/22 Loel Dubonnet, NP (Cardiology) - Patient presented for Paroxysmal Atrial Fibrillation and other concerns. Stopped Amoxicillin- Pot Clavulanate. Stopped Benzonatate. Stopped Fexofenadine HCL. Stopped Polymyxin B- Trimethoprim.  Hospital visits: None in previous 6 months  Objective:  Lab Results  Component Value Date   CREATININE 0.95 05/02/2022   BUN 25 05/02/2022   GFR 77.14 07/06/2015   GFRNONAA >60 11/21/2019   GFRAA >60 11/21/2019   NA 140 05/02/2022   K 4.4 05/02/2022   CALCIUM 9.0 05/02/2022   CO2 22 05/02/2022   GLUCOSE 97 05/02/2022    Lab Results  Component Value Date/Time   GFR 77.14 07/06/2015 03:07 PM   GFR 82.79 04/08/2015 11:57 AM    Last diabetic Eye exam: No results found for: "HMDIABEYEEXA"  Last diabetic Foot exam: No results found for: "HMDIABFOOTEX"   Lab Results  Component Value Date   CHOL 144 07/04/2022   HDL 39 (L) 07/04/2022   LDLCALC 76 07/04/2022   LDLDIRECT 96.0 10/07/2017   TRIG 168 (H) 07/04/2022   CHOLHDL 3.7 07/04/2022       Latest Ref Rng & Units 07/04/2022    9:39  AM 05/02/2022    9:00 AM 05/17/2021    8:35 AM  Hepatic Function  Total Protein 6.0 - 8.5 g/dL 6.8  6.4  6.6   Albumin 3.8 - 4.8 g/dL 4.5  4.3  4.4   AST 0 - 40 IU/L 36  19  27   ALT 0 - 44 IU/L 41  19  21   Alk Phosphatase 44 - 121 IU/L 87  90  82   Total  Bilirubin 0.0 - 1.2 mg/dL 0.3  0.6  0.6   Bilirubin, Direct 0.00 - 0.40 mg/dL 0.11       Lab Results  Component Value Date/Time   TSH 2.110 03/01/2022 03:15 PM   TSH 4.07 08/06/2013 09:37 AM       Latest Ref Rng & Units 03/01/2022    3:15 PM 11/21/2019    5:02 AM 11/20/2019    5:33 AM  CBC  WBC 3.4 - 10.8 x10E3/uL 9.3  7.5  18.5   Hemoglobin 13.0 - 17.7 g/dL 17.6  14.4  18.2   Hematocrit 37.5 - 51.0 % 50.9  45.6  55.7   Platelets 150 - 450 x10E3/uL 230  174  221     No results found for: "VD25OH"  Clinical ASCVD: No  The 10-year ASCVD risk score (Arnett DK, et al., 2019) is: 25.8%   Values used to calculate the score:     Age: 33 years     Sex: Male     Is Non-Hispanic African American: No     Diabetic: No     Tobacco smoker: No     Systolic Blood Pressure: 976 mmHg     Is BP treated: Yes     HDL Cholesterol: 39 mg/dL     Total Cholesterol: 144 mg/dL       05/14/2022    8:32 AM 04/16/2022   10:41 AM 01/16/2022    9:13 AM  Depression screen PHQ 2/9  Decreased Interest 0 0 1  Down, Depressed, Hopeless 0 0 0  PHQ - 2 Score 0 0 1  Altered sleeping  0 1  Tired, decreased energy  0 1  Change in appetite  0 1  Feeling bad or failure about yourself   0 0  Trouble concentrating  0 1  Moving slowly or fidgety/restless  0 0  Suicidal thoughts  0 0  PHQ-9 Score  0 5  Difficult doing work/chores   Not difficult at all     CHA2DS2/VAS Stroke Risk Points  Current as of 2 hours ago     2 >= 2 Points: High Risk  1 - 1.99 Points: Medium Risk  0 Points: Low Risk    Last Change: N/A      Details    This score determines the patient's risk of having a stroke if the  patient has atrial fibrillation.       Points Metrics  0 Has Congestive Heart Failure:  No    Current as of 2 hours ago  0 Has Vascular Disease:  No    Current as of 2 hours ago  1 Has Hypertension:  Yes    Current as of 2 hours ago  1 Age:  4    Current as of 2 hours ago  0 Has Diabetes:  No    Current as  of 2 hours ago  0 Had Stroke:  No  Had TIA:  No  Had Thromboembolism:  No    Current as of 2  hours ago  0 Male:  No    Current as of 2 hours ago       Social History   Tobacco Use  Smoking Status Never  Smokeless Tobacco Never   BP Readings from Last 3 Encounters:  06/05/22 116/60  04/30/22 120/66  04/16/22 124/70   Pulse Readings from Last 3 Encounters:  06/05/22 67  04/30/22 66  04/16/22 60   Wt Readings from Last 3 Encounters:  06/05/22 198 lb 9.6 oz (90.1 kg)  05/14/22 194 lb (88 kg)  04/30/22 198 lb (89.8 kg)   BMI Readings from Last 3 Encounters:  06/05/22 26.20 kg/m  05/14/22 25.60 kg/m  04/30/22 25.42 kg/m    Assessment/Interventions: Review of patient past medical history, allergies, medications, health status, including review of consultants reports, laboratory and other test data, was performed as part of comprehensive evaluation and provision of chronic care management services.   SDOH:  (Social Determinants of Health) assessments and interventions performed: No SDOH Interventions    Flowsheet Row Clinical Support from 05/08/2021 in [place] at [place] Management from 08/24/2020 in [place] at [facility] from 05/04/2020 in [place] at [place] Interventions Intervention Not Indicated -- Intervention Not Indicated  Housing Interventions Intervention Not Indicated -- Intervention Not Indicated  Transportation Interventions Intervention Not Indicated Intervention Not Indicated Intervention Not Indicated  Depression Interventions/Treatment  -- -- PHQ2-9 Score <4 Follow-up Not Indicated  Financial Strain Interventions -- Intervention Not Indicated Intervention Not Indicated  Physical Activity Interventions Intervention Not Indicated -- Other (Comments)  [Patient states that he is really active doing things around his home no set routine for exercise]   Stress Interventions Intervention Not Indicated -- Intervention Not Indicated  Social Connections Interventions Intervention Not Indicated -- Intervention Not Indicated      SDOH Screenings   Food Insecurity: No Food Insecurity (05/14/2022)  Housing: Low Risk  (05/08/2021)  Transportation Needs: No Transportation Needs (05/14/2022)  Alcohol Screen: Low Risk  (05/08/2021)  Depression (PHQ2-9): Low Risk  (05/14/2022)  Financial Resource Strain: Low Risk  (05/14/2022)  Physical Activity: Inactive (05/14/2022)  Social Connections: Socially Integrated (05/08/2021)  Stress: No Stress Concern Present (05/14/2022)  Tobacco Use: Low Risk  (06/05/2022)    [place]  Allergies  Allergen Reactions   Chocolate Palpitations   Chocolate Flavor Shortness Of Breath    Other reaction(s): Respiratory Distress   Dextroamphetamine Shortness Of Breath    Other reaction(s): Respiratory Distress   Dextromethorphan Palpitations   Flomax [Tamsulosin Hcl] Other (See Comments)    ? Causes A Fib   Floxin [Ofloxacin] Other (See Comments)    hallucinations   Guaifenesin Shortness Of Breath   Lisinopril Cough   Tavist Allergy [Clemastine Fumarate] Other (See Comments)    A fib worse   Clemastine     Other reaction(s): Irregular Heart Rate   Robitussin Cold+Flu Daytime  [Dm-Phenylephrine-Acetaminophen]     Other reaction(s): Respiratory Distress   Tamsulosin Other (See Comments)    Other reaction(s): Irregular Heart Rate   Amiodarone Other (See Comments)    Abn  thyroid   Robitussin Cf [Pseudoephedrine-Dm-Gg] Palpitations    Medications Reviewed Today     Reviewed by Viona Gilmore, [facility] (Pharmacist) on 08/27/22 at 1316  Med List Status: <None>   Medication Order Taking? Sig Documenting Provider Last Dose Status Informant  ALPRAZolam (XANAX) 0.25 MG tablet [id number]  Take 1 tablet by mouth at night  as needed for insomnia [name], MD  Active   Cholecalciferol (VITAMIN D3) 1000 UNITS CAPS  [id number] No Take 2,000 Units by mouth every morning. [provider] Taking Active Self  diltiazem (CARDIZEM CD) 240 MG 24 hr capsule [id number] No TAKE 1 CAPSULE BY MOUTH EVERY MORNING Skeet Latch, MD Taking Active   flecainide (TAMBOCOR) 100 MG tablet [id number] No Take 1 tablet (100 mg total) by mouth 2 (two) times daily. Skeet Latch, MD Taking Active   losartan (COZAAR) 25 MG tablet [id number] No TAKE 1 TABLET BY MOUTH EVERY DAY Skeet Latch, MD Taking Active   metoprolol succinate (TOPROL-XL) 25 MG 24 hr tablet [id number] No TAKE 1 TABLET EVERY DAY WITH OR IMMEDIATELY FOLLOWING A MEAL Skeet Latch, MD Taking Active   metoprolol succinate (TOPROL-XL) 25 MG 24 hr tablet [id number] No Take 1 tablet by mouth daily with or immediately after a meal Skeet Latch, MD Taking Active   Multiple Vitamin (MULTIVITAMIN) tablet [id number] No Take 1 tablet by mouth daily. [provider] Taking Active Self           Med Note Wyatt Portela, ANAIS   Fri Jun 24, 2015  8:01 AM)     neomycin-polymyxin-hydrocortisone (CORTISPORIN) OTIC solution [id number] No Use 4 drops into both ears three times daily Burchette, Alinda Sierras, MD Taking Active   potassium citrate (UROCIT-K) 10 MEQ (1080 MG) SR tablet [id number] No TAKE 1 TABLET BY MOUTH THREE TIMES DAILY Irine Seal, MD Taking Active   rosuvastatin (CRESTOR) 40 MG tablet [id number] No Take 1 tablet (40 mg total) by mouth daily. Loel Dubonnet, NP Taking Active   sertraline (ZOLOFT) 50 MG tablet [id number] No TAKE 1 TABLET BY MOUTH EVERY DAY Burchette, Alinda Sierras, MD Taking Active   warfarin (COUMADIN) 5 MG tablet [id number] No TAKE 1 TO 1 AND 1/2 TABLETS DAILY AS DIRECTED BY COUMADIN CLINIC Skeet Latch, MD Taking Active             Patient Active Problem List   Diagnosis Date Noted   Ascending aortic aneurysm ([place]) 04/30/2022   Irregular bowel habits 11/21/2019   SBO (small bowel obstruction) ([place]) 11/20/2019   History of kidney  stones 04/02/2018   Chronic insomnia 04/07/2017   Epigastric discomfort 11/23/2014   LLQ discomfort 11/23/2014   Encounter for therapeutic drug monitoring 10/22/2013   BPH associated with nocturia 08/06/2013   Atrial flutter ([place]) 11/17/2012   Anxiety 10/03/2012   Benign hypertensive heart disease without heart failure 11/28/2011   Regional enteritis of unspecified site 11/07/2011   Duodenitis 11/07/2011   Depression, recurrent ([place]) 11/06/2011   Abdominal pain 10/22/2011   Crohn disease ([place]) 10/22/2011   Anticoagulated on Coumadin 10/22/2011   Chest pain radiating to arm 09/14/2011   Leucocytosis 09/09/2011   Cough, persistent 07/30/2011   Paroxysmal atrial fibrillation ([place])    Pure hypercholesterolemia    Essential hypertension    Aortic insufficiency    Systolic murmur     Immunization History  Administered Date(s) Administered   Fluad Quad(high Dose 65+) 05/28/2019, 06/13/2020, 05/29/2021, 06/05/2022   Influenza Split 07/30/2011, 07/06/2013   Influenza, High Dose Seasonal PF 05/26/2015, 05/18/2016, 06/18/2017, 07/07/2018   Influenza,inj,Quad PF,6+ Mos 06/16/2014   PFIZER(Purple Top)SARS-COV-2 Vaccination 10/22/2019, 11/17/2019, 06/14/2020, 12/30/2020   Pfizer Covid-19 Vaccine Bivalent Booster 38yr & up 06/19/2021   Pneumococcal Conjugate-13 09/17/2010, 08/31/2013   Pneumococcal Polysaccharide-23 04/08/2015   Tdap 11/27/2011   Zoster Recombinat (Shingrix) 06/19/2021   Zoster, Live 09/28/2010   He was not  walking as he should per recommendations from cardiology. His dog's vet recommended exercise for dog and now he is walking 1-2 miles per day every day with the dog. He is enjoying doing this now.   Conditions to be addressed/monitored:  Hypertension, Hyperlipidemia, Atrial Fibrillation, Depression, Anxiety and Insomnia  Conditions addressed this visit: Hypertension, Atrial fibrillation  Care Plan : CCM Pharmacy Care Plan  Updates made by Viona Gilmore, [place]  since 08/27/2022 12:00 AM     Problem: Problem: Hypertension, Hyperlipidemia, Atrial Fibrillation, Depression, Anxiety and Insomnia      Long-Range Goal: Patient-Specific Goal   Start Date: 02/17/2021  Expected End Date: 02/17/2022  Recent Progress: On track  Priority: High  Note:   Current Barriers:  Unable to independently monitor therapeutic efficacy  Pharmacist Clinical Goal(s):  Patient will achieve adherence to monitoring guidelines and medication adherence to achieve therapeutic efficacy through collaboration with PharmD and provider.   Interventions: 1:1 collaboration with [name], MD regarding development and update of comprehensive plan of care as evidenced by provider attestation and co-signature Inter-disciplinary care team collaboration (see longitudinal plan of care) Comprehensive medication review performed; medication list updated in electronic medical record  Hypertension (BP goal <140/90) -Controlled -Current treatment: Losartan 25 mg 1 tablet daily  - Appropriate, Effective, Safe, Accessible Diltiazem 240 mg 1 capsule in the morning - Appropriate, Effective, Safe, Accessible Metoprolol succinate 25 mg 1 tablet daily - in AM - Appropriate, Effective, Safe, Accessible -Medications previously tried: none  -Current home readings: 141/74 (checking not often)  -Current dietary habits: did not discuss -Current exercise habits: did not discuss -Denies hypotensive/hypertensive symptoms -Educated on BP goals and benefits of medications for prevention of heart attack, stroke and kidney damage; Exercise goal of 150 minutes per week; Importance of home blood pressure monitoring; Proper BP monitoring technique; -Counseled to monitor BP at home weekly, document, and provide log at future appointments -Counseled on diet and exercise extensively Recommended to continue current medication  Hyperlipidemia: (LDL goal < 100) -Controlled -Current  treatment: Rosuvastatin 20 mg 1 tablet daily - Appropriate, Effective, Safe, Accessible -Medications previously tried: none  -Current dietary patterns: did not discuss -Current exercise habits: did not discuss -Educated on Cholesterol goals;  Importance of limiting foods high in cholesterol; Exercise goal of 150 minutes per week; -Counseled on diet and exercise extensively Recommended to continue current medication Recommended repeat lipid panel with cardiology.  Atrial Fibrillation (Goal: prevent stroke and major bleeding) -Controlled -CHADSVASC: 2 -Current treatment: Rate/rhythm control: Diltiazem 240 mg 1 capsule in the morning; Metoprolol succinate 25 mg 1 tablet daily in morning; Flecainide 100 mg 1 tablet twice daily - Appropriate, Effective, Safe, Accessible Anticoagulation: Warfarin 5 mg, 7.5 mg on Mon, Wed and Fri and 5 mg on all other days - Appropriate, Effective, Safe, Accessible -Medications previously tried: none -Home BP and HR readings: 70s (checking once a month) -Counseled on importance of adherence to anticoagulant exactly as prescribed; avoidance of NSAIDs due to increased bleeding risk with anticoagulants; -Counseled on diet and exercise extensively Recommended to continue current medication  Depression/Anxiety (Goal: minimize symptoms) -Controlled -Current treatment: Sertraline 50 mg 1 tablet daily - Appropriate, Effective, Safe, Accessible -Medications previously tried/failed: none -PHQ9: 0 -GAD7: n/a -Educated on Benefits of medication for symptom control -Recommended to continue current medication  Insomnia (Goal: improve quality and quantity of sleep) -Not ideally controlled -Current treatment  Alprazolam 0.25 mg 1 tablet at bedtime as needed - Appropriate, Effective, Safe, Accessible -Medications previously tried: none  -  Counseled on long term risks of taking benzodiazepines such as increased risk for bone fractures and memory loss - patient reports  taking almost every night  BPH (Goal: minimize symptoms) -Controlled -Current treatment  No medications -Medications previously tried: Alfuzosin (flare up)  -Recommended to continue as is  Health Maintenance -Vaccine gaps: shingrix (2nd dose) -Current therapy:  Vitamin D 2000 units 1 capsule daily Multivitamin 1 tablet daily Potassium citrate 10 mEq 1 tablet 3 times daily (urology) -Educated on Cost vs benefit of each product must be carefully weighed by individual consumer -Patient is satisfied with current therapy and denies issues -Recommended to continue current medication  Patient Goals/Self-Care Activities Patient will:  - check blood pressure weekly, document, and provide at future appointments target a minimum of 150 minutes of moderate intensity exercise weekly  Follow Up Plan: Telephone follow up appointment with care management team member scheduled for: 1 year      Medication Assistance: None required.  Patient affirms current coverage meets needs.  Compliance/Adherence/Medication fill history: Care Gaps: Shingrix (second dose), colonoscopy, tetanus, COVID booster Last BP: 116/70 (06/05/22)  Star-Rating Drugs: Rosuvastatin (Crestor) 20 mg - [facility]/22 [facility] Losartan 25 mg - Last filled 07/03/22 90 at [facility]   Patient's preferred pharmacy is:  [facility], [address] [place] [address] Phone: [phone] Fax: [phone]  [place], [place] - [address] AT [place] [address] Phone: [phone] Fax: [phone]  [id number] [address] [address] [address]: [phone]: [phone]  Uses pill box? Yes Pt endorses 100% compliance  We discussed: Current pharmacy is preferred  with insurance plan and patient is satisfied with pharmacy services Patient decided to: Continue current medication management strategy  Care Plan and Follow Up Patient Decision:  Patient agrees to Care Plan and Follow-up.  Plan: Telephone follow up appointment with care management team member scheduled for:  1 year  MJeni Salles PharmD BTuckerPharmacist LOccidental Pet[place]umat [phone]

## 2022-09-16 DIAGNOSIS — E7800 Pure hypercholesterolemia, unspecified: Secondary | ICD-10-CM

## 2022-09-16 DIAGNOSIS — I480 Paroxysmal atrial fibrillation: Secondary | ICD-10-CM

## 2022-09-19 ENCOUNTER — Ambulatory Visit: Payer: PPO | Attending: Cardiovascular Disease

## 2022-09-19 DIAGNOSIS — Z5181 Encounter for therapeutic drug level monitoring: Secondary | ICD-10-CM | POA: Diagnosis not present

## 2022-09-19 DIAGNOSIS — I480 Paroxysmal atrial fibrillation: Secondary | ICD-10-CM

## 2022-09-19 LAB — POCT INR: INR: 2.4 (ref 2.0–3.0)

## 2022-09-19 NOTE — Patient Instructions (Signed)
Description   Continue taking '5mg'$  daily except 7.'5mg'$  every Fridays.  Recheck INR in 7 weeks. Coumadin Clinic [phone]

## 2022-09-24 ENCOUNTER — Other Ambulatory Visit: Payer: Self-pay

## 2022-09-24 ENCOUNTER — Other Ambulatory Visit (HOSPITAL_COMMUNITY): Payer: Self-pay

## 2022-09-24 ENCOUNTER — Telehealth: Payer: Self-pay | Admitting: Family Medicine

## 2022-09-24 MED ORDER — ALPRAZOLAM 0.25 MG PO TABS
ORAL_TABLET | ORAL | 5 refills | Status: DC
  Filled 2022-09-24: qty 30, 30d supply, fill #0
  Filled 2022-10-22: qty 30, 30d supply, fill #1
  Filled 2022-11-19: qty 30, 30d supply, fill #2
  Filled 2022-12-24: qty 30, 30d supply, fill #3
  Filled 2023-01-21: qty 30, 30d supply, fill #4
  Filled 2023-02-18: qty 30, 30d supply, fill #5

## 2022-09-24 MED FILL — Losartan Potassium Tab 25 MG: ORAL | 90 days supply | Qty: 90 | Fill #1 | Status: AC

## 2022-09-24 MED FILL — Diltiazem HCl Coated Beads Cap ER 24HR 240 MG: ORAL | 90 days supply | Qty: 90 | Fill #0 | Status: AC

## 2022-09-24 NOTE — Telephone Encounter (Signed)
Richard phar at [facility] is calling and pt needs new rx ALPRAZolam (XANAX) 0.25 MG tablet

## 2022-10-22 ENCOUNTER — Other Ambulatory Visit: Payer: Self-pay

## 2022-10-22 ENCOUNTER — Other Ambulatory Visit (HOSPITAL_COMMUNITY): Payer: Self-pay

## 2022-10-22 MED FILL — Potassium Citrate Tab ER 10 MEQ (1080 MG): ORAL | 90 days supply | Qty: 270 | Fill #0 | Status: CN

## 2022-10-24 ENCOUNTER — Other Ambulatory Visit: Payer: Self-pay

## 2022-10-26 ENCOUNTER — Other Ambulatory Visit: Payer: Self-pay | Admitting: Urology

## 2022-10-26 ENCOUNTER — Other Ambulatory Visit (HOSPITAL_COMMUNITY): Payer: Self-pay

## 2022-10-26 MED ORDER — POTASSIUM CITRATE ER 10 MEQ (1080 MG) PO TBCR
10.0000 meq | EXTENDED_RELEASE_TABLET | Freq: Three times a day (TID) | ORAL | 3 refills | Status: DC
  Filled 2022-10-26: qty 270, 90d supply, fill #0
  Filled 2023-03-11: qty 270, 90d supply, fill #1
  Filled 2023-07-15: qty 270, 90d supply, fill #2

## 2022-10-29 ENCOUNTER — Other Ambulatory Visit (HOSPITAL_COMMUNITY): Payer: Self-pay

## 2022-11-01 DIAGNOSIS — H6123 Impacted cerumen, bilateral: Secondary | ICD-10-CM | POA: Diagnosis not present

## 2022-11-07 ENCOUNTER — Ambulatory Visit: Payer: PPO | Attending: Cardiology | Admitting: *Deleted

## 2022-11-07 DIAGNOSIS — I480 Paroxysmal atrial fibrillation: Secondary | ICD-10-CM

## 2022-11-07 DIAGNOSIS — Z5181 Encounter for therapeutic drug level monitoring: Secondary | ICD-10-CM | POA: Diagnosis not present

## 2022-11-07 LAB — POCT INR: INR: 2.7 (ref 2.0–3.0)

## 2022-11-07 NOTE — Patient Instructions (Addendum)
Description   Continue taking warfarin 80m daily except 7.516mevery Fridays.  Recheck INR in 7 weeks. Coumadin Clinic [phone]

## 2022-11-19 ENCOUNTER — Other Ambulatory Visit (HOSPITAL_COMMUNITY): Payer: Self-pay

## 2022-11-19 ENCOUNTER — Other Ambulatory Visit: Payer: Self-pay

## 2022-12-08 ENCOUNTER — Emergency Department (HOSPITAL_BASED_OUTPATIENT_CLINIC_OR_DEPARTMENT_OTHER)
Admission: EM | Admit: 2022-12-08 | Discharge: 2022-12-08 | Disposition: A | Discharge status: Home / Self Care | Payer: PPO | Attending: Emergency Medicine | Admitting: Emergency Medicine

## 2022-12-08 ENCOUNTER — Encounter (HOSPITAL_BASED_OUTPATIENT_CLINIC_OR_DEPARTMENT_OTHER): Payer: Self-pay

## 2022-12-08 DIAGNOSIS — W268XXA Contact with other sharp object(s), not elsewhere classified, initial encounter: Secondary | ICD-10-CM | POA: Insufficient documentation

## 2022-12-08 DIAGNOSIS — I10 Essential (primary) hypertension: Secondary | ICD-10-CM | POA: Insufficient documentation

## 2022-12-08 DIAGNOSIS — S0993XA Unspecified injury of face, initial encounter: Secondary | ICD-10-CM | POA: Diagnosis present

## 2022-12-08 DIAGNOSIS — Y93E8 Activity, other personal hygiene: Secondary | ICD-10-CM | POA: Insufficient documentation

## 2022-12-08 DIAGNOSIS — S00511A Abrasion of lip, initial encounter: Secondary | ICD-10-CM

## 2022-12-08 DIAGNOSIS — Z79899 Other long term (current) drug therapy: Secondary | ICD-10-CM | POA: Diagnosis not present

## 2022-12-08 DIAGNOSIS — I4891 Unspecified atrial fibrillation: Secondary | ICD-10-CM | POA: Insufficient documentation

## 2022-12-08 DIAGNOSIS — Z7901 Long term (current) use of anticoagulants: Secondary | ICD-10-CM | POA: Insufficient documentation

## 2022-12-08 MED ORDER — LIDOCAINE HCL (PF) 1 % IJ SOLN
5.0000 mL | Freq: Once | INTRAMUSCULAR | Status: AC
  Administered 2022-12-08: 5 mL
  Filled 2022-12-08: qty 5

## 2022-12-08 MED ORDER — SILVER NITRATE-POT NITRATE 75-25 % EX MISC
1.0000 | Freq: Once | CUTANEOUS | Status: AC
  Administered 2022-12-08: 1 via TOPICAL
  Filled 2022-12-08: qty 10

## 2022-12-08 NOTE — ED Triage Notes (Signed)
Pt presents POV   Pt reports he changed his razor today, cut his Left lower lip. Pt has tried [place] with no relief. The area has clotted but continues to bleed around the scab when he eats or smiles. Pt does take Coumadin

## 2022-12-08 NOTE — ED Notes (Signed)
Pt verbalized understanding of d/c instructions, meds, and followup care. Denies questions. VSS, no distress noted. Steady gait to exit with all belongings.  ?

## 2022-12-08 NOTE — Discharge Instructions (Signed)
Note the visit emergency department today was overall reassuring.  Be careful washing affected area so as to not dislodge silver nitrate cautery.  Please do not hesitate to return to emergency department for worrisome signs and symptoms we discussed become apparent.

## 2022-12-08 NOTE — ED Provider Notes (Signed)
[facility] Provider Note   CSN: [id number] Arrival date & time: 12/08/22  1254     History  Chief Complaint  Patient presents with   Lip Laceration    Francis Campbell. is a 77 y.o. male.  HPI   77 year old male presents emergency department with complaints of cut on lip.  Patient states that he was shaving his face earlier today and suffered a cut to his left lower lip.  Patient on Coumadin for history of atrial fibrillation.  States that this has happened 2-3 times prior and has had difficulty stopping the blood.  Has tried at home skin glue/liquid Band-Aid with no success in stopping bleeding.  States that silver nitrate has worked in the past.  Past medical history significant for atrial flutter on Coumadin, hypertension, ascending aortic aneurysm, TIA ED, hypercholesterolemia,   Home Medications Prior to Admission medications   Medication Sig Start Date End Date Taking? Authorizing Provider  ALPRAZolam Duanne Moron) 0.25 MG tablet Take 1 tablet by mouth at night as needed for insomnia 09/24/22   Burchette, Alinda Sierras, MD  Cholecalciferol (VITAMIN D3) 1000 UNITS CAPS Take 2,000 Units by mouth every morning.    [provider]  diltiazem (CARDIZEM CD) 240 MG 24 hr capsule TAKE 1 CAPSULE BY MOUTH EVERY MORNING 03/19/22   Skeet Latch, MD  flecainide (TAMBOCOR) 100 MG tablet Take 1 tablet (100 mg total) by mouth 2 (two) times daily. 01/01/22   Skeet Latch, MD  losartan (COZAAR) 25 MG tablet TAKE 1 TABLET BY MOUTH EVERY DAY 10/23/21   Skeet Latch, MD  metoprolol succinate (TOPROL-XL) 25 MG 24 hr tablet TAKE 1 TABLET EVERY DAY WITH OR IMMEDIATELY FOLLOWING A MEAL 05/22/22   Skeet Latch, MD  metoprolol succinate (TOPROL-XL) 25 MG 24 hr tablet Take 1 tablet by mouth daily with or immediately after a meal 02/16/22   Skeet Latch, MD  Multiple Vitamin (MULTIVITAMIN) tablet Take 1 tablet by mouth daily.    [provider]  neomycin-polymyxin-hydrocortisone (CORTISPORIN) OTIC solution Use 4 drops into both ears three times daily 12/05/21   Burchette, Alinda Sierras, MD  potassium citrate (UROCIT-K) 10 MEQ (1080 MG) SR tablet TAKE 1 TABLET BY MOUTH THREE TIMES DAILY 10/24/21   Irine Seal, MD  potassium citrate (UROCIT-K) 10 MEQ (1080 MG) SR tablet Take 1 tablet (10 mEq total) by mouth 3 (three) times daily. 10/26/22     rosuvastatin (CRESTOR) 40 MG tablet Take 1 tablet (40 mg total) by mouth daily. 05/14/22   Loel Dubonnet, NP  sertraline (ZOLOFT) 50 MG tablet TAKE 1 TABLET BY MOUTH EVERY DAY 03/19/22   Burchette, Alinda Sierras, MD  warfarin (COUMADIN) 5 MG tablet TAKE 1 TO 1 AND 1/2 TABLETS DAILY AS DIRECTED BY COUMADIN CLINIC 03/19/22   Skeet Latch, MD      Allergies    Chocolate, Chocolate flavor, Dextroamphetamine, Dextromethorphan, Flomax [tamsulosin hcl], Floxin [ofloxacin], Guaifenesin, Lisinopril, Tavist allergy [clemastine fumarate], Clemastine, Robitussin cold+flu daytime  [dm-phenylephrine-acetaminophen], Tamsulosin, Amiodarone, and Robitussin cf [pseudoephedrine-dm-gg]    Review of Systems   Review of Systems  All other systems reviewed and are negative.   Physical Exam Updated Vital Signs BP 137/77 (BP Location: Right Arm)   Pulse 61   Temp 97.9 F (36.6 C)   Resp 16   SpO2 97%  Physical Exam Vitals and nursing note reviewed.  Constitutional:      General: He is not in acute distress.    Appearance:  He is well-developed.  HENT:     Head: Normocephalic and atraumatic.     Mouth/Throat:     Comments: Very small pinpoint abrasion noted beneath the left lower lip.  Active trickling bleeding appreciated. Eyes:     Conjunctiva/sclera: Conjunctivae normal.  Cardiovascular:     Rate and Rhythm: Normal rate and regular rhythm.     Heart sounds: No murmur heard. Pulmonary:     Effort: Pulmonary effort is normal. No respiratory distress.     Breath sounds: Normal breath sounds.  Abdominal:      Palpations: Abdomen is soft.     Tenderness: There is no abdominal tenderness.  Musculoskeletal:        General: No swelling.     Cervical back: Neck supple.  Skin:    General: Skin is warm and dry.     Capillary Refill: Capillary refill takes less than 2 seconds.  Neurological:     Mental Status: He is alert.  Psychiatric:        Mood and Affect: Mood normal.     ED Results / Procedures / Treatments   Labs (all labs ordered are listed, but only abnormal results are displayed) Labs Reviewed - No data to display  EKG None  Radiology No results found.  Procedures Cauterization  Date/Time: 12/08/2022 2:41 PM  Performed by: Wilnette Kales, PA Authorized by: Wilnette Kales, PA  Consent: Verbal consent obtained. Consent given by: patient Patient understanding: patient states understanding of the procedure being performed Patient consent: the patient's understanding of the procedure matches consent given Procedure consent: procedure consent matches procedure scheduled Patient identity confirmed: verbally with patient Comments: Silver nitrate to beneath lower lip left       Medications Ordered in ED Medications  silver nitrate applicators applicator 1 Application (1 Application Topical Given by Other 12/08/22 1354)  lidocaine (PF) (XYLOCAINE) 1 % injection 5 mL (5 mLs Infiltration Given by Other 12/08/22 1354)    ED Course/ Medical Decision Making/ A&P                             Medical Decision Making  This patient presents to the ED for concern of abrasion, this involves an extensive number of treatment options, and is a complaint that carries with it a high risk of complications and morbidity.  The differential diagnosis includes laceration, abrasion   Co morbidities that complicate the patient evaluation  See HPI   Additional history obtained:  Additional history obtained from EMR External records from outside source obtained and reviewed including  hospital records   Lab Tests:  N/a   Imaging Studies ordered:  N/a   Cardiac Monitoring: / EKG:  The patient was maintained on a cardiac monitor.  I personally viewed and interpreted the cardiac monitored which showed an underlying rhythm of: Sinus rhythm   Consultations Obtained:  N/a   Problem List / ED Course / Critical interventions / Medication management  Abrasion I ordered medication including lidocaine, silver nitrate   Reevaluation of the patient after these medicines showed that the patient improved I have reviewed the patients home medicines and have made adjustments as needed   Social Determinants of Health:  Denies tobacco, illicit drug use   Test / Admission - Considered:  Abrasion Vitals signs within normal range and stable throughout visit. Patient presents emergency department with difficulty controlling bleeding from abrasion experienced while shaving earlier today.  Hemostasis was acquired  using silver nitrate.  Further workup deemed unnecessary at this time given patient's compliance with warfarin and stable vital signs/lack of significant concern for excess bleeding.  Patient recommended routine follow-up with primary care provider.  Treatment plan discussed at length with patient and he acknowledged understanding was agreeable to said plan. Worrisome signs and symptoms were discussed with the patient, and the patient acknowledged understanding to return to the ED if noticed. Patient was stable upon discharge.          Final Clinical Impression(s) / ED Diagnoses Final diagnoses:  Abrasion of lip, initial encounter    Rx / DC Orders ED Discharge Orders     None         Wilnette Kales, [place] 12/08/22 1444    Margette Fast, MD 12/09/22 [phone]

## 2022-12-10 ENCOUNTER — Ambulatory Visit (HOSPITAL_BASED_OUTPATIENT_CLINIC_OR_DEPARTMENT_OTHER): Payer: PPO | Admitting: Family

## 2022-12-10 ENCOUNTER — Encounter (HOSPITAL_BASED_OUTPATIENT_CLINIC_OR_DEPARTMENT_OTHER): Payer: Self-pay | Admitting: Family

## 2022-12-10 VITALS — BP 128/80 | HR 55 | Ht 73.0 in | Wt 202.0 lb

## 2022-12-10 DIAGNOSIS — D6859 Other primary thrombophilia: Secondary | ICD-10-CM | POA: Diagnosis not present

## 2022-12-10 DIAGNOSIS — I480 Paroxysmal atrial fibrillation: Secondary | ICD-10-CM

## 2022-12-10 DIAGNOSIS — I10 Essential (primary) hypertension: Secondary | ICD-10-CM | POA: Diagnosis not present

## 2022-12-10 DIAGNOSIS — I7121 Aneurysm of the ascending aorta, without rupture: Secondary | ICD-10-CM | POA: Diagnosis not present

## 2022-12-10 NOTE — Progress Notes (Signed)
Office Visit    Patient Name: Francis Campbell. Date of Encounter: 12/10/2022  PCP:  [name], MD   [place]  Cardiologist:  Skeet Latch, MD  Advanced Practice Provider:  No care team member to display Electrophysiologist:  None      Chief Complaint    Francis Pauls. is a 77 y.o. male presents today for follow up of PAF and HTN  Past Medical History    Past Medical History:  Diagnosis Date   Allergy    Aortic regurgitation    Arthritis    Ascending aortic aneurysm ([place]) 04/30/2022   Asthma    only as child   Atrial flutter ([place])    a. Dx 09/2012.   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   TIA (transient ischemic attack)    ~2005 - had visual symptoms, was told by [name] this may have been a TIA but it's unclear   Valvular heart disease    Mild AI, ASclerosis without AS by echo 10/2011   Past Surgical History:  Procedure Laterality Date   CARDIOVERSION N/A 05/08/2013   Procedure: CARDIOVERSION;  Surgeon: Darlin Coco, MD;  Location[facility] ENDOSCOPY;  Service: Cardiovascular;  Laterality: N/A;   CATARACT EXTRACTION     Right 12/2019   CATARACT EXTRACTION     CYSTOSCOPY/RETROGRADE/URETEROSCOPY/STONE EXTRACTION WITH BASKET Right 02/12/2013   Procedure: CYSTOSCOPY/RETROGRADE/URETEROSCOPY/STONE EXTRACTION WITH BASKET, WITH  STENT PLACEMENT RIGHT URETERAL DILATION;  Surgeon: [name], MD;  Location: [facility] ORS;  Service: Urology;  Laterality: Right;   EYE SURGERY Bilateral 1950, 1960, 1964   left, right, left   HERNIA REPAIR  2005   KNEE SURGERY Bilateral 1990, 2000   LITHOTRIPSY     ROTATOR CUFF REPAIR Left 2000   SHOULDER SURGERY Right 04/24/2022   TONSILLECTOMY      Allergies  Allergies  Allergen Reactions    Chocolate Palpitations   Chocolate Flavor Shortness Of Breath    Other reaction(s): Respiratory Distress   Dextroamphetamine Shortness Of Breath    Other reaction(s): Respiratory Distress   Dextromethorphan Palpitations   Flomax [Tamsulosin Hcl] Other (See Comments)    ? Causes A Fib   Floxin [Ofloxacin] Other (See Comments)    hallucinations   Guaifenesin Shortness Of Breath   Lisinopril Cough   Tavist Allergy [Clemastine Fumarate] Other (See Comments)    A fib worse   Clemastine     Other reaction(s): Irregular Heart Rate   Robitussin Cold+Flu Daytime  [Dm-Phenylephrine-Acetaminophen]     Other reaction(s): Respiratory Distress   Tamsulosin Other (See Comments)    Other reaction(s): Irregular Heart Rate   Amiodarone Other (See Comments)    Abn  thyroid   Robitussin Cf [Pseudoephedrine-Dm-Gg] Palpitations    History of Present Illness    Francis Sheeley. is a 77 y.o. male with a hx of paroxysmal atrial fibrillation on Coumadin, hypertension, hyperlipidemia, Crohn's disease last seen 04/30/2022 by Dr. Oval Linsey.  Previous patient of [name].  Evaluated by [name] for catheter ablation 2014 and elected to use flecainide as pill in the pocket.  Cardioversion 04/2013.  Myoview 09/2011 negative for ischemia and echo 10/2011 EF 60%, mild AI.  August  2017 he was started on daily flecainide due to increased episodes of atrial fibrillation.  Metoprolol reduced due to bradycardia.  ED visit 08/2017 intermittent chest tightness which were atypical with no acute findings.  Exercise Myoview January 2019 negative for ischemia.  Last seen 04/30/2022 doing overall.  Updated echocardiogram 05/31/2022 normal LVEF 60 to 75%, no RWMA, moderate LVH, RV normal, mild AI with aortic valve sclerosis, mild dilation of aortic root 43 mm.  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  Did have  episode of atrial fibrillation Monday which resolved with an additional dosing of his flecainide.  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 BP at home has been 120s/80s. Walking dog for exercise up to 2 miles at a time without exertional dyspnea nor chest pain.   EKGs/Labs/Other Studies Reviewed:   The following studies were reviewed today:  Cardiac Studies & Procedures     STRESS TESTS  MYOCARDIAL PERFUSION IMAGING 09/25/2017  Narrative  The patient walked for a total of 10 minutes and 15 seconds under standard Bruce protocol treadmill test. The peak heart rate is 142 which is 96% predicted maximal heart rate.  There were no ST or T wave changes to suggest ischemia.  Nuclear stress EF: 61%. The left ventricular ejection fraction is normal (55-65%).  This is a low risk study. The study is normal.   ECHOCARDIOGRAM  ECHOCARDIOGRAM COMPLETE 05/31/2022  Narrative ECHOCARDIOGRAM REPORT    Patient Name:   Francis Campbell. Date of Exam: 05/31/2022 Medical Rec #:  [id number]          Height:       73.0 in Accession #:    [id number]         Weight:       194.0 lb Date of Birth:  12-10-1945          BSA:          2.124 m Patient Age:    38 years           BP:           130/70 mmHg Patient Gender: M                  HR:           62 bpm. Exam Location:  Outpatient  Procedure: 2D Echo, 3D Echo, Strain Analysis, Cardiac Doppler and Color Doppler  Indications:    Aneurysm of ascending aorta without rupture  History:        Patient has prior history of Echocardiogram examinations, most recent 01/03/2018. Arrythmias:Atrial Fibrillation and Atrial Flutter, Signs/Symptoms:Murmur and Chest Pain; Risk Factors:Hypertension, Dyslipidemia and Non-Smoker.  Sonographer:    Leavy Cella RDCS Referring Phys: [id number] TIFFANY [place]  IMPRESSIONS   1. Left ventricular ejection fraction, by  estimation, is 65 to 70%. The left ventricle has normal function. The left ventricle has no regional wall motion abnormalities. There is moderate asymmetric left ventricular hypertrophy of the basal-septal segment. Left ventricular diastolic parameters are consistent with Grade I diastolic dysfunction (impaired relaxation). The average left ventricular global longitudinal strain is -20.0 %. The global longitudinal strain is normal. 2. Right ventricular systolic function is normal. The right ventricular size is mildly enlarged. There is normal pulmonary artery systolic pressure. 3. The mitral valve is grossly normal. No evidence  of mitral valve regurgitation. No evidence of mitral stenosis. 4. The aortic valve is tricuspid. Aortic valve regurgitation is mild. Aortic valve sclerosis is present, with no evidence of aortic valve stenosis. 5. Aortic dilatation noted. There is mild dilatation of the aortic root, measuring 43 mm. 6. The inferior vena cava is normal in size with greater than 50% respiratory variability, suggesting right atrial pressure of 3 mmHg.  Comparison(s): Prior images unable to be directly viewed, comparison made by report only. Further Sinus of Valsalva dilation with similar AI.  FINDINGS Left Ventricle: Left ventricular ejection fraction, by estimation, is 65 to 70%. The left ventricle has normal function. The left ventricle has no regional wall motion abnormalities. The average left ventricular global longitudinal strain is -20.0 %. The global longitudinal strain is normal. The left ventricular internal cavity size was normal in size. There is moderate asymmetric left ventricular hypertrophy of the basal-septal segment. Left ventricular diastolic parameters are consistent with Grade I diastolic dysfunction (impaired relaxation).  Right Ventricle: The right ventricular size is mildly enlarged. No increase in right ventricular wall thickness. Right ventricular systolic function is  normal. There is normal pulmonary artery systolic pressure. The tricuspid regurgitant velocity is 2.57 m/s, and with an assumed right atrial pressure of 3 mmHg, the estimated right ventricular systolic pressure is [id number] mmHg.  Left Atrium: Left atrial size was normal in size.  Right Atrium: Right atrial size was normal in size.  Pericardium: Trivial pericardial effusion is present. Presence of epicardial fat layer.  Mitral Valve: The mitral valve is grossly normal. No evidence of mitral valve regurgitation. No evidence of mitral valve stenosis.  Tricuspid Valve: The tricuspid valve is normal in structure. Tricuspid valve regurgitation is mild . No evidence of tricuspid stenosis.  Aortic Valve: The aortic valve is tricuspid. There is mild aortic valve annular calcification. Aortic valve regurgitation is mild. Aortic regurgitation PHT measures 959 msec. Aortic valve sclerosis is present, with no evidence of aortic valve stenosis.  Pulmonic Valve: The pulmonic valve was normal in structure. Pulmonic valve regurgitation is mild. No evidence of pulmonic stenosis.  Aorta: Aortic dilatation noted. There is mild dilatation of the aortic root, measuring 43 mm.  Venous: The inferior vena cava is normal in size with greater than 50% respiratory variability, suggesting right atrial pressure of 3 mmHg.  IAS/Shunts: No atrial level shunt detected by color flow Doppler.   LEFT VENTRICLE PLAX 2D LVIDd:         4.31 cm   Diastology LVIDs:         2.48 cm   LV e' medial:    7.18 cm/s LV PW:         1.24 cm   LV E/e' medial:  11.0 LV IVS:        1.43 cm   LV e' lateral:   7.40 cm/s LVOT diam:     1.90 cm   LV E/e' lateral: 10.7 LV SV:         80 LV SV Index:   38        2D Longitudinal Strain LVOT Area:     2.84 cm  2D Strain GLS Avg:     -20.0 %   RIGHT VENTRICLE RV Basal diam:  4.48 cm RV Mid diam:    4.00 cm RV S prime:     20.20 cm/s TAPSE (M-mode): 2.9 cm  LEFT ATRIUM             Index  RIGHT ATRIUM           Index LA diam:        4.00 cm 1.88 cm/m   RA Area:     15.90 cm LA Vol (A2C):   60.0 ml 28.25 ml/m  RA Volume:   38.90 ml  18.31 ml/m LA Vol (A4C):   48.2 ml 22.69 ml/m LA Biplane Vol: 59.4 ml 27.97 ml/m AORTIC VALVE LVOT Vmax:   135.00 cm/s LVOT Vmean:  83.100 cm/s LVOT VTI:    0.283 m AI PHT:      959 msec  AORTA Ao Root diam: 4.30 cm Ao Asc diam:  3.40 cm  MITRAL VALVE               TRICUSPID VALVE MV Area (PHT): 3.12 cm    TR Peak grad:   26.4 mmHg MV Decel Time: 243 msec    TR Vmax:        257.00 cm/s MV E velocity: 79.00 cm/s MV A velocity: 87.50 cm/s  SHUNTS MV E/A ratio:  0.90        Systemic VTI:  0.28 m Systemic Diam: 1.90 cm  Rudean Haskell MD Electronically signed by Rudean Haskell MD Signature Date/Time: 05/31/2022/3:54:40 PM    Final              EKG:  EKG is ordered today.  The ekg ordered today demonstrates SB 57 bpm with no acute ST/T wave changes.   Recent Labs: 03/01/2022: Hemoglobin 17.6; Magnesium 2.3; Platelets 230; TSH 2.110 05/02/2022: BUN 25; Creatinine, Ser 0.95; Potassium 4.4; Sodium 140 07/04/2022: ALT 41  Recent Lipid Panel    Component Value Date/Time   CHOL 144 07/04/2022 0939   TRIG 168 (H) 07/04/2022 0939   HDL 39 (L) 07/04/2022 0939   CHOLHDL 3.7 07/04/2022 0939   CHOLHDL 5 10/07/2017 1510   VLDL 58.4 (H) 10/07/2017 1510   LDLCALC 76 07/04/2022 0939   LDLDIRECT 96.0 10/07/2017 1510    Risk Assessment/Calculations:   CHA2DS2-VASc Score = 4   This indicates a 4.8% annual risk of stroke. The patient's score is based upon: CHF History: 0 HTN History: 1 Diabetes History: 0 Stroke History: 0 Vascular Disease History: 1 Age Score: 2 Gender Score: 0     Home Medications   Current Meds  Medication Sig   ALPRAZolam (XANAX) 0.25 MG tablet Take 1 tablet by mouth at night as needed for insomnia   Cholecalciferol (VITAMIN D3) 1000 UNITS CAPS Take 2,000 Units by mouth every morning.    diltiazem (CARDIZEM CD) 240 MG 24 hr capsule TAKE 1 CAPSULE BY MOUTH EVERY MORNING   flecainide (TAMBOCOR) 100 MG tablet Take 1 tablet (100 mg total) by mouth 2 (two) times daily.   losartan (COZAAR) 25 MG tablet TAKE 1 TABLET BY MOUTH EVERY DAY   metoprolol succinate (TOPROL-XL) 25 MG 24 hr tablet TAKE 1 TABLET EVERY DAY WITH OR IMMEDIATELY FOLLOWING A MEAL   Multiple Vitamin (MULTIVITAMIN) tablet Take 1 tablet by mouth daily.   neomycin-polymyxin-hydrocortisone (CORTISPORIN) OTIC solution Use 4 drops into both ears three times daily   potassium citrate (UROCIT-K) 10 MEQ (1080 MG) SR tablet Take 1 tablet (10 mEq total) by mouth 3 (three) times daily.   rosuvastatin (CRESTOR) 40 MG tablet Take 1 tablet (40 mg total) by mouth daily.   sertraline (ZOLOFT) 50 MG tablet TAKE 1 TABLET BY MOUTH EVERY DAY   warfarin (COUMADIN) 5 MG tablet TAKE 1 TO 1 AND 1/2 TABLETS DAILY  AS DIRECTED BY COUMADIN CLINIC     Review of Systems      All other systems reviewed and are otherwise negative except as noted above.  Physical Exam    VS:  BP 132/70   Pulse (!) 55   Ht 6\' 1"  (1.854 m)   Wt 202 lb (91.6 kg)   BMI 26.65 kg/m  , BMI Body mass index is 26.65 kg/m.  Wt Readings from Last 3 Encounters:  12/10/22 202 lb (91.6 kg)  06/05/22 198 lb 9.6 oz (90.1 kg)  05/14/22 194 lb (88 kg)    GEN: Well nourished, well developed, in no acute distress. HEENT: normal. Neck: Supple, no JVD, carotid bruits, or masses. Cardiac: RRR, no murmurs, rubs, or gallops. No clubbing, cyanosis, edema.  Radials/PT 2+ and equal bilaterally.  Respiratory:  Respirations regular and unlabored, clear to auscultation bilaterally. GI: Soft, nontender, nondistended. MS: No deformity or atrophy. Skin: Warm and dry, no rash. Neuro:  Strength and sensation are intact. Psych: Normal affect.  Assessment & Plan    Proximal atrial fibrillation/bradycardia/hypercoagulable state -maintaining sinus rhythm today.  Brief episode of  atrial fibrillation last week which resolved with additional dose of flecainide..  Continue diltiazem, metoprolol, flecainide at present doses.  Denies bleeding complications on warfarin. CHA2DS2-VASc Score = 4 [CHF History: 0, HTN History: 1, Diabetes History: 0, Stroke History: 0, Vascular Disease History: 1, Age Score: 2, Gender Score: 0].  Therefore, the patient's annual risk of stroke is 4.8 %.   He is asymptomatic in regards to his bradycardia with no lightheadedness, dizziness, near-syncope, syncope.Update CBC, BMP for monitoring.  Hypertension - BP well controlled. Continue current antihypertensive regimen including diltiazem 240 mg daily, losartan 25 mg daily, Toprol 25 mg daily. Discussed to monitor BP at home at least 2 hours after medications and sitting for 5-10 minutes. BP goal <130/80.    Hyperlipidemia -continue rosuvastatin 20 mg daily.  Denies myalgias.  Aortic valve sclerosis and insufficiency / Aortic root dilation-mild AI and aortic root 43 mm by echo 05/2022.  Plan for repeat echo 05/2023.  Discussed the importance of optimal blood pressure control in clinic today. Continue optimal blood pressure control.     Disposition: Follow up in 6 months with Skeet Latch, MD   Signed, Loel Dubonnet, NP 12/10/2022, 3:26 PM [place]

## 2022-12-10 NOTE — Patient Instructions (Signed)
Medication Instructions:  Your physician recommends that you continue on your current medications as directed. Please refer to the Current Medication list given to you today.  *If you need a refill on your cardiac medications before your next appointment, please call your pharmacy*   Lab Work: Your physician recommends that you return for lab work today- BMP, CBC   If you have labs (blood work) drawn today and your tests are completely normal, you will receive your results only by: MyChart Message (if you have MyChart) OR A paper copy in the mail If you have any lab test that is abnormal or we need to change your treatment, we will call you to review the results.   Testing/Procedures: Your physician has requested that you have an echocardiogram after September 14th. Echocardiography is a painless test that uses sound waves to create images of your heart. It provides your doctor with information about the size and shape of your heart and how well your heart's chambers and valves are working. This procedure takes approximately one hour. There are no restrictions for this procedure. Please do NOT wear cologne, perfume, aftershave, or lotions (deodorant is allowed). Please arrive 15 minutes prior to your appointment time.    Follow-Up: At [facility], you and your health needs are our priority.  As part of our continuing mission to provide you with exceptional heart care, we have created designated Provider Care Teams.  These Care Teams include your primary Cardiologist (physician) and Advanced Practice Providers (APPs -  Physician Assistants and Nurse Practitioners) who all work together to provide you with the care you need, when you need it.  We recommend signing up for the patient portal called "MyChart".  Sign up information is provided on this After Visit Summary.  MyChart is used to connect with patients for Virtual Visits (Telemedicine).  Patients are able to view lab/test  results, encounter notes, upcoming appointments, etc.  Non-urgent messages can be sent to your provider as well.   To learn more about what you can do with MyChart, go to [email or web address].    Your next appointment:   Follow up as scheduled

## 2022-12-11 LAB — BASIC METABOLIC PANEL WITH GFR
BUN/Creatinine Ratio: 23 (ref 10–24)
BUN: 25 mg/dL (ref 8–27)
CO2: 21 mmol/L (ref 20–29)
Calcium: 9.7 mg/dL (ref 8.6–10.2)
Chloride: 106 mmol/L (ref 96–106)
Creatinine, Ser: 1.07 mg/dL (ref 0.76–1.27)
Glucose: 97 mg/dL (ref 70–99)
Potassium: 4.2 mmol/L (ref 3.5–5.2)
Sodium: 144 mmol/L (ref 134–144)
eGFR: 72 mL/min/1.73

## 2022-12-11 LAB — CBC
Hematocrit: 51.8 % — ABNORMAL HIGH (ref 37.5–51.0)
Hemoglobin: 16.9 g/dL (ref 13.0–17.7)
MCH: 28.1 pg (ref 26.6–33.0)
MCHC: 32.6 g/dL (ref 31.5–35.7)
MCV: 86 fL (ref 79–97)
Platelets: 215 x10E3/uL (ref 150–450)
RBC: 6.01 x10E6/uL — ABNORMAL HIGH (ref 4.14–5.80)
RDW: 13.2 % (ref 11.6–15.4)
WBC: 9.4 x10E3/uL (ref 3.4–10.8)

## 2022-12-12 ENCOUNTER — Telehealth (HOSPITAL_BASED_OUTPATIENT_CLINIC_OR_DEPARTMENT_OTHER): Payer: Self-pay

## 2022-12-12 NOTE — Telephone Encounter (Signed)
-----   Message from Loel Dubonnet, NP sent at 12/12/2022 10:26 AM EDT ----- CBC stable with no new anemia. Normal kidney function and electrolytes. Good result!

## 2022-12-12 NOTE — Telephone Encounter (Signed)
Lab results called to patient who verbalized understanding. Dontrey Snellgrove M[facility] RN CCM 

## 2022-12-24 ENCOUNTER — Other Ambulatory Visit: Payer: Self-pay | Admitting: Cardiovascular Disease

## 2022-12-24 ENCOUNTER — Other Ambulatory Visit (HOSPITAL_BASED_OUTPATIENT_CLINIC_OR_DEPARTMENT_OTHER): Payer: Self-pay | Admitting: Cardiovascular Disease

## 2022-12-24 ENCOUNTER — Other Ambulatory Visit: Payer: Self-pay

## 2022-12-24 ENCOUNTER — Other Ambulatory Visit (HOSPITAL_COMMUNITY): Payer: Self-pay

## 2022-12-24 MED ORDER — FLECAINIDE ACETATE 100 MG PO TABS
100.0000 mg | ORAL_TABLET | Freq: Two times a day (BID) | ORAL | 3 refills | Status: DC
  Filled 2022-12-24: qty 200, 100d supply, fill #0
  Filled 2023-04-01: qty 200, 100d supply, fill #1
  Filled 2023-07-08: qty 200, 100d supply, fill #2
  Filled 2023-10-21 – 2023-10-24 (×2): qty 120, 60d supply, fill #3

## 2022-12-24 MED ORDER — LOSARTAN POTASSIUM 25 MG PO TABS
25.0000 mg | ORAL_TABLET | Freq: Every day | ORAL | 3 refills | Status: DC
  Filled 2022-12-24: qty 100, 100d supply, fill #0
  Filled 2023-04-01: qty 100, 100d supply, fill #1
  Filled 2023-07-08: qty 100, 100d supply, fill #2
  Filled 2023-10-21 – 2023-10-24 (×2): qty 60, 60d supply, fill #3

## 2022-12-24 MED FILL — Diltiazem HCl Coated Beads Cap ER 24HR 240 MG: ORAL | 90 days supply | Qty: 90 | Fill #1 | Status: AC

## 2022-12-24 NOTE — Telephone Encounter (Signed)
Rx request sent to pharmacy.  

## 2022-12-25 ENCOUNTER — Encounter (HOSPITAL_BASED_OUTPATIENT_CLINIC_OR_DEPARTMENT_OTHER): Payer: Self-pay | Admitting: Cardiovascular Disease

## 2022-12-26 ENCOUNTER — Ambulatory Visit: Payer: PPO | Attending: Cardiovascular Disease

## 2022-12-26 DIAGNOSIS — Z5181 Encounter for therapeutic drug level monitoring: Secondary | ICD-10-CM | POA: Diagnosis not present

## 2022-12-26 DIAGNOSIS — I480 Paroxysmal atrial fibrillation: Secondary | ICD-10-CM | POA: Diagnosis not present

## 2022-12-26 LAB — POCT INR: INR: 2.6 (ref 2.0–3.0)

## 2022-12-26 NOTE — Patient Instructions (Signed)
Description   Continue taking warfarin 5mg daily except 7.5mg every Fridays.  Recheck INR in 7 weeks. [facility] [phone]      

## 2022-12-27 ENCOUNTER — Telehealth: Payer: Self-pay | Admitting: Family Medicine

## 2022-12-27 NOTE — Telephone Encounter (Signed)
Contacted [employer]. to schedule their annual wellness visit. Appointment made for 12/31/22.  Rudell Cobb AWV direct phone # [phone]*

## 2022-12-31 ENCOUNTER — Ambulatory Visit (INDEPENDENT_AMBULATORY_CARE_PROVIDER_SITE_OTHER): Payer: PPO

## 2022-12-31 VITALS — Ht 73.0 in | Wt 198.0 lb

## 2022-12-31 DIAGNOSIS — Z1211 Encounter for screening for malignant neoplasm of colon: Secondary | ICD-10-CM | POA: Diagnosis not present

## 2022-12-31 DIAGNOSIS — Z0000 Encounter for general adult medical examination without abnormal findings: Secondary | ICD-10-CM | POA: Diagnosis not present

## 2022-12-31 NOTE — Patient Instructions (Addendum)
Francis Campbell , Thank you for taking time to come for your Medicare Wellness Visit. I appreciate your ongoing commitment to your health goals. Please review the following plan we discussed and let me know if I can assist you in the future.   These are the goals we discussed:  Goals       Exercise 150 minutes per week (moderate activity)      May walk more Early in the am or late in the evening      Patient Stated      05/14/2022, get out of sling and get full mobility of arm      Patient stated (pt-stated)      Make it one more day.        This is a list of the screening recommended for you and due dates:  Health Maintenance  Topic Date Due   DTaP/Tdap/Td vaccine (2 - Td or Tdap) 11/26/2021   COVID-19 Vaccine (6 - 2023-24 season) 01/16/2023*   Zoster (Shingles) Vaccine (2 of 2) 04/01/2023*   Colon Cancer Screening  12/31/2023*   Flu Shot  04/18/2023   Medicare Annual Wellness Visit  12/31/2023   Pneumonia Vaccine  Completed   Hepatitis C Screening: USPSTF Recommendation to screen - Ages 18-79 yo.  Completed   HPV Vaccine  Aged Out  *Topic was postponed. The date shown is not the original due date.    Advanced directives: Please bring a copy of your health care power of attorney and living will to the office to be added to your chart at your convenience.   Conditions/risks identified: None  Next appointment: Follow up in one year for your annual wellness visit.   Preventive Care 76 Years and Older, Male  Preventive care refers to lifestyle choices and visits with your health care provider that can promote health and wellness. What does preventive care include? A yearly physical exam. This is also called an annual well check. Dental exams once or twice a year. Routine eye exams. Ask your health care provider how often you should have your eyes checked. Personal lifestyle choices, including: Daily care of your teeth and gums. Regular physical activity. Eating a healthy  diet. Avoiding tobacco and drug use. Limiting alcohol use. Practicing safe sex. Taking low doses of aspirin every day. Taking vitamin and mineral supplements as recommended by your health care provider. What happens during an annual well check? The services and screenings done by your health care provider during your annual well check will depend on your age, overall health, lifestyle risk factors, and family history of disease. Counseling  Your health care provider may ask you questions about your: Alcohol use. Tobacco use. Drug use. Emotional well-being. Home and relationship well-being. Sexual activity. Eating habits. History of falls. Memory and ability to understand (cognition). Work and work Astronomer. Screening  You may have the following tests or measurements: Height, weight, and BMI. Blood pressure. Lipid and cholesterol levels. These may be checked every 5 years, or more frequently if you are over 40 years old. Skin check. Lung cancer screening. You may have this screening every year starting at age 53 if you have a 30-pack-year history of smoking and currently smoke or have quit within the past 15 years. Fecal occult blood test (FOBT) of the stool. You may have this test every year starting at age 41. Flexible sigmoidoscopy or colonoscopy. You may have a sigmoidoscopy every 5 years or a colonoscopy every 10 years starting at age 10. Prostate  cancer screening. Recommendations will vary depending on your family history and other risks. Hepatitis C blood test. Hepatitis B blood test. Sexually transmitted disease (STD) testing. Diabetes screening. This is done by checking your blood sugar (glucose) after you have not eaten for a while (fasting). You may have this done every 1-3 years. Abdominal aortic aneurysm (AAA) screening. You may need this if you are a current or former smoker. Osteoporosis. You may be screened starting at age 63 if you are at high risk. Talk with  your health care provider about your test results, treatment options, and if necessary, the need for more tests. Vaccines  Your health care provider may recommend certain vaccines, such as: Influenza vaccine. This is recommended every year. Tetanus, diphtheria, and acellular pertussis (Tdap, Td) vaccine. You may need a Td booster every 10 years. Zoster vaccine. You may need this after age 49. Pneumococcal 13-valent conjugate (PCV13) vaccine. One dose is recommended after age 51. Pneumococcal polysaccharide (PPSV23) vaccine. One dose is recommended after age 43. Talk to your health care provider about which screenings and vaccines you need and how often you need them. This information is not intended to replace advice given to you by your health care provider. Make sure you discuss any questions you have with your health care provider. Document Released: 09/30/2015 Document Revised: 05/23/2016 Document Reviewed: 07/05/2015 Elsevier Interactive Patient Education  2017 [employer].  Fall Prevention in the Home Falls can cause injuries. They can happen to people of all ages. There are many things you can do to make your home safe and to help prevent falls. What can I do on the outside of my home? Regularly fix the edges of walkways and driveways and fix any cracks. Remove anything that might make you trip as you walk through a door, such as a raised step or threshold. Trim any bushes or trees on the path to your home. Use bright outdoor lighting. Clear any walking paths of anything that might make someone trip, such as rocks or tools. Regularly check to see if handrails are loose or broken. Make sure that both sides of any steps have handrails. Any raised decks and porches should have guardrails on the edges. Have any leaves, snow, or ice cleared regularly. Use sand or salt on walking paths during winter. Clean up any spills in your garage right away. This includes oil or grease spills. What  can I do in the bathroom? Use night lights. Install grab bars by the toilet and in the tub and shower. Do not use towel bars as grab bars. Use non-skid mats or decals in the tub or shower. If you need to sit down in the shower, use a plastic, non-slip stool. Keep the floor dry. Clean up any water that spills on the floor as soon as it happens. Remove soap buildup in the tub or shower regularly. Attach bath mats securely with double-sided non-slip rug tape. Do not have throw rugs and other things on the floor that can make you trip. What can I do in the bedroom? Use night lights. Make sure that you have a light by your bed that is easy to reach. Do not use any sheets or blankets that are too big for your bed. They should not hang down onto the floor. Have a firm chair that has side arms. You can use this for support while you get dressed. Do not have throw rugs and other things on the floor that can make you trip. What  can I do in the kitchen? Clean up any spills right away. Avoid walking on wet floors. Keep items that you use a lot in easy-to-reach places. If you need to reach something above you, use a strong step stool that has a grab bar. Keep electrical cords out of the way. Do not use floor polish or wax that makes floors slippery. If you must use wax, use non-skid floor wax. Do not have throw rugs and other things on the floor that can make you trip. What can I do with my stairs? Do not leave any items on the stairs. Make sure that there are handrails on both sides of the stairs and use them. Fix handrails that are broken or loose. Make sure that handrails are as long as the stairways. Check any carpeting to make sure that it is firmly attached to the stairs. Fix any carpet that is loose or worn. Avoid having throw rugs at the top or bottom of the stairs. If you do have throw rugs, attach them to the floor with carpet tape. Make sure that you have a light switch at the top of the  stairs and the bottom of the stairs. If you do not have them, ask someone to add them for you. What else can I do to help prevent falls? Wear shoes that: Do not have high heels. Have rubber bottoms. Are comfortable and fit you well. Are closed at the toe. Do not wear sandals. If you use a stepladder: Make sure that it is fully opened. Do not climb a closed stepladder. Make sure that both sides of the stepladder are locked into place. Ask someone to hold it for you, if possible. Clearly mark and make sure that you can see: Any grab bars or handrails. First and last steps. Where the edge of each step is. Use tools that help you move around (mobility aids) if they are needed. These include: Canes. Walkers. Scooters. Crutches. Turn on the lights when you go into a dark area. Replace any light bulbs as soon as they burn out. Set up your furniture so you have a clear path. Avoid moving your furniture around. If any of your floors are uneven, fix them. If there are any pets around you, be aware of where they are. Review your medicines with your doctor. Some medicines can make you feel dizzy. This can increase your chance of falling. Ask your doctor what other things that you can do to help prevent falls. This information is not intended to replace advice given to you by your health care provider. Make sure you discuss any questions you have with your health care provider. Document Released: 06/30/2009 Document Revised: 02/09/2016 Document Reviewed: 10/08/2014 Elsevier Interactive Patient Education  2017 [employer].

## 2022-12-31 NOTE — Progress Notes (Signed)
Subjective:   Francis Sprinkle. is a 77 y.o. male who presents for Medicare Annual/Subsequent preventive examination.  Review of Systems    Virtual Visit via Telephone Note  I connected with  Francis Campbell. on 12/31/22 at  2:30 PM EDT by telephone and verified that I am speaking with the correct person using two identifiers.  Location: Patient: Home Provider: Office Persons participating in the virtual visit: patient/Nurse Health Advisor   I discussed the limitations, risks, security and privacy concerns of performing an evaluation and management service by telephone and the availability of in person appointments. The patient expressed understanding and agreed to proceed.  Interactive audio and video telecommunications were attempted between this nurse and patient, however failed, due to patient having technical difficulties OR patient did not have access to video capability.  We continued and completed visit with audio only.  Some vital signs may be absent or patient reported.   Tillie Rung, LPN  Cardiac Risk Factors include: advanced age (>81men, >103 women);male gender;hypertension     Objective:    Today's Vitals   12/31/22 1443  Weight: 198 lb (89.8 kg)  Height:  (1.854 m)   Body mass index is 26.12 kg/m.     12/31/2022    2:51 PM 12/08/2022    1:30 PM 05/14/2022    8:31 AM 05/08/2021    2:37 PM 05/04/2020    2:53 PM 11/20/2019    5:21 AM 10/11/2016   10:42 AM  Advanced Directives  Does Patient Have a Medical Advance Directive? Yes No Yes Yes Yes No No  Type of Estate agent of [place];Living will  Healthcare Power of [place];Living will Healthcare Power of [place];Living will Healthcare Power of [place];Living will    Does patient want to make changes to medical advance directive?     No - Patient declined    Copy of Healthcare Power of Attorney in Chart? No - copy requested  No - copy requested No - copy requested No - copy  requested    Would patient like information on creating a medical advance directive?  No - Patient declined    No - Patient declined     Current Medications (verified) Outpatient Encounter Medications as of 12/31/2022  Medication Sig   ALPRAZolam (XANAX) 0.25 MG tablet Take 1 tablet by mouth at night as needed for insomnia   Cholecalciferol (VITAMIN D3) 1000 UNITS CAPS Take 2,000 Units by mouth every morning.   diltiazem (CARDIZEM CD) 240 MG 24 hr capsule TAKE 1 CAPSULE BY MOUTH EVERY MORNING   flecainide (TAMBOCOR) 100 MG tablet Take 1 tablet (100 mg total) by mouth 2 (two) times daily.   losartan (COZAAR) 25 MG tablet Take 1 tablet (25 mg total) by mouth daily.   metoprolol succinate (TOPROL-XL) 25 MG 24 hr tablet TAKE 1 TABLET EVERY DAY WITH OR IMMEDIATELY FOLLOWING A MEAL   Multiple Vitamin (MULTIVITAMIN) tablet Take 1 tablet by mouth daily.   neomycin-polymyxin-hydrocortisone (CORTISPORIN) OTIC solution Use 4 drops into both ears three times daily   potassium citrate (UROCIT-K) 10 MEQ (1080 MG) SR tablet Take 1 tablet (10 mEq total) by mouth 3 (three) times daily.   rosuvastatin (CRESTOR) 40 MG tablet Take 1 tablet (40 mg total) by mouth daily.   sertraline (ZOLOFT) 50 MG tablet TAKE 1 TABLET BY MOUTH EVERY DAY   warfarin (COUMADIN) 5 MG tablet TAKE 1 TO 1 AND 1/2 TABLETS DAILY AS DIRECTED BY COUMADIN CLINIC  No facility-administered encounter medications on file as of 12/31/2022.    Allergies (verified) Chocolate, Chocolate flavor, Dextroamphetamine, Dextromethorphan, Flomax [tamsulosin hcl], Floxin [ofloxacin], Guaifenesin, Lisinopril, Tavist allergy [clemastine fumarate], Clemastine, Robitussin cold+flu daytime  [dm-phenylephrine-acetaminophen], Tamsulosin, Amiodarone, and Robitussin cf [pseudoephedrine-dm-gg]   History: Past Medical History:  Diagnosis Date   Allergy    Aortic regurgitation    Arthritis    Ascending aortic aneurysm 04/30/2022   Asthma    only as child    Atrial flutter    a. Dx 09/2012.   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   TIA (transient ischemic attack)    ~2005 - had visual symptoms, was told by Dr. Patty Sermons this may have been a TIA but it's unclear   Valvular heart disease    Mild AI, ASclerosis without AS by echo 10/2011   Past Surgical History:  Procedure Laterality Date   CARDIOVERSION N/A 05/08/2013   Procedure: CARDIOVERSION;  Surgeon: Cassell Clement, MD;  Location: [facility] ENDOSCOPY;  Service: Cardiovascular;  Laterality: N/A;   CATARACT EXTRACTION     Right 12/2019   CATARACT EXTRACTION     CYSTOSCOPY/RETROGRADE/URETEROSCOPY/STONE EXTRACTION WITH BASKET Right 02/12/2013   Procedure: CYSTOSCOPY/RETROGRADE/URETEROSCOPY/STONE EXTRACTION WITH BASKET, WITH  STENT PLACEMENT RIGHT URETERAL DILATION;  Surgeon: Anner Crete, MD;  Location: [facility] ORS;  Service: Urology;  Laterality: Right;   EYE SURGERY Bilateral 1950, 1960, 1964   left, right, left   HERNIA REPAIR  2005   KNEE SURGERY Bilateral 1990, 2000   LITHOTRIPSY     ROTATOR CUFF REPAIR Left 2000   SHOULDER SURGERY Right 04/24/2022   TONSILLECTOMY     Family History  Problem Relation Age of Onset   Stroke Mother    Hypertension Mother    Kidney disease Mother        stones   Nephrolithiasis Brother    Arthritis Paternal Grandmother    Heart disease Paternal Grandmother    Leukemia Father    Heart disease Father    Nephrolithiasis Father    Kidney cancer Son    Appendicitis Son    Heart attack Neg Hx    Colon cancer Neg Hx    Esophageal cancer Neg Hx    Pancreatic cancer Neg Hx    Stomach cancer Neg Hx    Liver disease Neg Hx    Social History   Socioeconomic History   Marital status: Married    Spouse name: Not on file   Number of children: 2   Years of education:  Not on file   Highest education level: Not on file  Occupational History   Occupation: TECHNICAL SUP    Employer: [employer]  Tobacco Use   Smoking status: Never   Smokeless tobacco: Never  Vaping Use   Vaping Use: Never used  Substance and Sexual Activity   Alcohol use: No   Drug use: No   Sexual activity: Yes    Birth control/protection: None  Other Topics Concern   Not on file  Social History Narrative   Non smoker  No ets          Social Determinants of Health   Financial Resource Strain: Low Risk  (12/31/2022)   Overall Financial Resource Strain (CARDIA)    Difficulty  of Paying Living Expenses: Not hard at all  Food Insecurity: No Food Insecurity (12/31/2022)   Hunger Vital Sign    Worried About Running Out of Food in the Last Year: Never true    Ran Out of Food in the Last Year: Never true  Transportation Needs: No Transportation Needs (12/31/2022)   PRAPARE - Administrator, Civil Service (Medical): No    Lack of Transportation (Non-Medical): No  Physical Activity: Sufficiently Active (12/31/2022)   Exercise Vital Sign    Days of Exercise per Week: 7 days    Minutes of Exercise per Session: 30 min  Stress: No Stress Concern Present (12/31/2022)   [employer] of Occupational Health - Occupational Stress Questionnaire    Feeling of Stress : Not at all  Social Connections: Socially Integrated (12/31/2022)   Social Connection and Isolation Panel [NHANES]    Frequency of Communication with Friends and Family: More than three times a week    Frequency of Social Gatherings with Friends and Family: More than three times a week    Attends Religious Services: More than 4 times per year    Active Member of [employer] or Organizations: Yes    Attends Engineer, structural: More than 4 times per year    Marital Status: Married    Tobacco Counseling Counseling given: Not Answered   Clinical Intake:  Pre-visit preparation completed: No  Pain : No/denies  pain     BMI - recorded: 26.12 Nutritional Risks: None Diabetes: No  How often do you need to have someone help you when you read instructions, pamphlets, or other written materials from your doctor or pharmacy?: 1 - Never  Diabetic?  No  Interpreter Needed?: No  Information entered by :: Theresa Mulligan LPN   Activities of Daily Living    12/31/2022    2:49 PM 05/14/2022    8:32 AM  In your present state of health, do you have any difficulty performing the following activities:  Hearing? 1 0  Comment Wears hearing aids has hearing aids  Vision? 0 0  Difficulty concentrating or making decisions? 0 0  Walking or climbing stairs? 0 0  Dressing or bathing? 0 0  Doing errands, shopping? 0 0  Preparing Food and eating ? N N  Using the Toilet? N N  In the past six months, have you accidently leaked urine? N N  Do you have problems with loss of bowel control? N N  Managing your Medications? N N  Managing your Finances? N N  Housekeeping or managing your Housekeeping? N N    Patient Care Team: Kristian Covey, MD as PCP - General (Family Medicine) Chilton Si, MD as PCP - Cardiology (Cardiology) Luretha Murphy, MD as Consulting Physician (General Surgery) Rachael Fee, MD as Attending Physician (Gastroenterology) Bjorn Pippin, MD as Attending Physician (Urology) Verner Chol, [facility] (Inactive) as Pharmacist (Pharmacist)  Indicate any recent Medical Services you may have received from other than [facility] providers in the past year (date may be approximate).     Assessment:   This is a routine wellness examination for [facility].  Hearing/Vision screen Hearing Screening - Comments:: Wears hearing aids Vision Screening - Comments:: Wears rx glasses - up to date with routine eye exams with  Dr Blima Ledger  Dietary issues and exercise activities discussed: Current Exercise Habits: Home exercise routine, Type of exercise: walking, Time (Minutes): 30, Frequency  (Times/Week): 7, Weekly Exercise (Minutes/Week): 210, Intensity: Moderate, Exercise limited by:  None identified   Goals Addressed               This Visit's Progress     Patient stated (pt-stated)        Make it one more day.       Depression Screen    12/31/2022    2:49 PM 05/14/2022    8:32 AM 04/16/2022   10:41 AM 01/16/2022    9:13 AM 05/08/2021    2:35 PM 05/04/2020    2:55 PM 10/11/2016   10:50 AM  PHQ 2/9 Scores  PHQ - 2 Score 0 0 0 1 0 0 0  PHQ- 9 Score   0 5  0     Fall Risk    12/31/2022    2:50 PM 05/14/2022    8:32 AM 05/12/2022   10:44 AM 05/08/2021    2:37 PM 05/04/2020    2:54 PM  Fall Risk   Falls in the past year? 0 0 0 0 0  Number falls in past yr: 0 0 0 0 0  Injury with Fall? 0 0 0 0 0  Risk for fall due to : No Fall Risks Medication side effect  No Fall Risks Medication side effect  Follow up Falls prevention discussed Falls evaluation completed;Education provided;Falls prevention discussed  Falls evaluation completed Falls evaluation completed;Falls prevention discussed    FALL RISK PREVENTION PERTAINING TO THE HOME:  Any stairs in or around the home? No  If so, are there any without handrails? No  Home free of loose throw rugs in walkways, pet beds, electrical cords, etc? Yes  Adequate lighting in your home to reduce risk of falls? Yes   ASSISTIVE DEVICES UTILIZED TO PREVENT FALLS:  Life alert? No  Use of a cane, walker or w/c? No  Grab bars in the bathroom? No  Shower chair or bench in shower? No  Elevated toilet seat or a handicapped toilet? Yes   TIMED UP AND GO:  Was the test performed? No . Audio Visit   Cognitive Function:        12/31/2022    2:51 PM 05/14/2022    8:33 AM  6CIT Screen  What Year? 0 points 0 points  What month? 0 points 0 points  What time? 0 points 0 points  Count back from 20 0 points 0 points  Months in reverse 0 points 0 points  Repeat phrase 0 points 0 points  Total Score 0 points 0 points     Immunizations Immunization History  Administered Date(s) Administered   Fluad Quad(high Dose 65+) 05/28/2019, 06/13/2020, 05/29/2021, 06/05/2022   Influenza Split 07/30/2011, 07/06/2013   Influenza, High Dose Seasonal PF 05/26/2015, 05/18/2016, 06/18/2017, 07/07/2018   Influenza,inj,Quad PF,6+ Mos 06/16/2014   PFIZER(Purple Top)SARS-COV-2 Vaccination 10/22/2019, 11/17/2019, 06/14/2020, 12/30/2020   Pfizer Covid-19 Vaccine Bivalent Booster 9yrs & up 06/19/2021   Pneumococcal Conjugate-13 09/17/2010, 08/31/2013   Pneumococcal Polysaccharide-23 04/08/2015   Tdap 11/27/2011   Zoster Recombinat (Shingrix) 06/19/2021   Zoster, Live 09/28/2010    TDAP status: Due, Education has been provided regarding the importance of this vaccine. Advised may receive this vaccine at local pharmacy or Health Dept. Aware to provide a copy of the vaccination record if obtained from local pharmacy or Health Dept. Verbalized acceptance and understanding.  Flu Vaccine status: Up to date  Pneumococcal vaccine status: Up to date  Covid-19 vaccine status: Completed vaccines  Qualifies for Shingles Vaccine? Yes   Zostavax completed No   Shingrix Completed?: Yes  Screening Tests Health Maintenance  Topic Date Due   DTaP/Tdap/Td (2 - Td or Tdap) 11/26/2021   COVID-19 Vaccine (6 - 2023-24 season) 01/16/2023 (Originally 05/18/2022)   Zoster Vaccines- Shingrix (2 of 2) 04/01/2023 (Originally 08/14/2021)   COLONOSCOPY (Pts 45-7yrs Insurance coverage will need to be confirmed)  12/31/2023 (Originally 11/06/2021)   INFLUENZA VACCINE  04/18/2023   Medicare Annual Wellness (AWV)  12/31/2023   Pneumonia Vaccine 18+ Years old  Completed   Hepatitis C Screening  Completed   HPV VACCINES  Aged Out    Health Maintenance  Health Maintenance Due  Topic Date Due   DTaP/Tdap/Td (2 - Td or Tdap) 11/26/2021    Colorectal cancer screening: Referral to GI placed 12/31/22. Pt aware the office will call re:  appt.  Lung Cancer Screening: (Low Dose CT Chest recommended if Age 20-80 years, 30 pack-year currently smoking OR have quit w/in 15years.) does not qualify.     Additional Screening:  Hepatitis C Screening: does qualify; Completed 10/11/16  Vision Screening: Recommended annual ophthalmology exams for early detection of glaucoma and other disorders of the eye. Is the patient up to date with their annual eye exam?  Yes  Who is the provider or what is the name of the office in which the patient attends annual eye exams? Dr Blima Ledger If pt is not established with a provider, would they like to be referred to a provider to establish care? No .   Dental Screening: Recommended annual dental exams for proper oral hygiene  Community Resource Referral / Chronic Care Management:  CRR required this visit?  No   CCM required this visit?  No      Plan:     I have personally reviewed and noted the following in the patient's chart:   Medical and social history Use of alcohol, tobacco or illicit drugs  Current medications and supplements including opioid prescriptions. Patient is not currently taking opioid prescriptions. Functional ability and status Nutritional status Physical activity Advanced directives List of other physicians Hospitalizations, surgeries, and ER visits in previous 12 months Vitals Screenings to include cognitive, depression, and falls Referrals and appointments  In addition, I have reviewed and discussed with patient certain preventive protocols, quality metrics, and best practice recommendations. A written personalized care plan for preventive services as well as general preventive health recommendations were provided to patient.     Tillie Rung, LPN   03/31/9538   Nurse Notes: None

## 2023-01-21 ENCOUNTER — Other Ambulatory Visit (HOSPITAL_COMMUNITY): Payer: Self-pay

## 2023-01-21 ENCOUNTER — Other Ambulatory Visit: Payer: Self-pay

## 2023-01-22 ENCOUNTER — Other Ambulatory Visit (HOSPITAL_COMMUNITY): Payer: Self-pay

## 2023-01-30 DIAGNOSIS — H6121 Impacted cerumen, right ear: Secondary | ICD-10-CM | POA: Diagnosis not present

## 2023-02-01 DIAGNOSIS — N5201 Erectile dysfunction due to arterial insufficiency: Secondary | ICD-10-CM | POA: Diagnosis not present

## 2023-02-01 DIAGNOSIS — N201 Calculus of ureter: Secondary | ICD-10-CM | POA: Diagnosis not present

## 2023-02-01 DIAGNOSIS — N200 Calculus of kidney: Secondary | ICD-10-CM | POA: Diagnosis not present

## 2023-02-01 DIAGNOSIS — R972 Elevated prostate specific antigen [PSA]: Secondary | ICD-10-CM | POA: Diagnosis not present

## 2023-02-13 ENCOUNTER — Ambulatory Visit: Payer: PPO | Attending: Cardiovascular Disease

## 2023-02-13 DIAGNOSIS — I480 Paroxysmal atrial fibrillation: Secondary | ICD-10-CM | POA: Diagnosis not present

## 2023-02-13 DIAGNOSIS — Z5181 Encounter for therapeutic drug level monitoring: Secondary | ICD-10-CM

## 2023-02-13 LAB — POCT INR: INR: 3.2 — AB (ref 2.0–3.0)

## 2023-02-13 NOTE — Patient Instructions (Signed)
Description   Skip today's dosage of Warfarin, then resume same dosage of warfarin 5mg  daily except 7.5mg  every Fridays.  Recheck INR in 5 weeks. Coumadin Clinic [phone]

## 2023-02-18 ENCOUNTER — Other Ambulatory Visit (HOSPITAL_BASED_OUTPATIENT_CLINIC_OR_DEPARTMENT_OTHER): Payer: Self-pay | Admitting: Cardiovascular Disease

## 2023-02-18 ENCOUNTER — Other Ambulatory Visit: Payer: Self-pay

## 2023-02-18 ENCOUNTER — Other Ambulatory Visit (HOSPITAL_COMMUNITY): Payer: Self-pay

## 2023-02-18 MED ORDER — METOPROLOL SUCCINATE ER 25 MG PO TB24
25.0000 mg | ORAL_TABLET | Freq: Every day | ORAL | 1 refills | Status: DC
  Filled 2023-02-18 – 2023-05-21 (×2): qty 90, 90d supply, fill #0

## 2023-02-18 NOTE — Telephone Encounter (Signed)
Rx request sent to pharmacy.  

## 2023-02-19 ENCOUNTER — Other Ambulatory Visit (HOSPITAL_COMMUNITY): Payer: Self-pay

## 2023-02-21 ENCOUNTER — Telehealth: Payer: Self-pay

## 2023-02-21 NOTE — Progress Notes (Signed)
Patient ID: Francis Bendolph., male   DOB: Sep 14, 1946, 77 y.o.   MRN: [id number]   Care Management & Coordination Services Pharmacy Team  Reason for Encounter: Hypertension  Contacted patient to discuss hypertension disease state. Spoke with patient on 02/21/2023     Current antihypertensive regimen:  Losartan 25 mg 1 tablet daily  Diltiazem 240 mg 1 capsule in the morning  Metoprolol succinate 25 mg 1 tablet daily   Patient verbally confirms he is taking the above medications as directed. Yes  How often are you checking your Blood Pressure? 1-2x per week  he checks his blood pressure in the afternoon after taking his medication.  Current home BP readings:  129/72 p 61 at office visit on 01/30/23 Patient reports he does not record his readings his "blood pressure cuff records it for him and he has been within normal range" patient reports he was out driving but will try and call back with readings to report. Patient denies any recent falls, hyper/hypotensive symptoms.   Any readings above 180/100? No If yes any symptoms of hypertensive emergency? patient denies any symptoms of high blood pressure  What recent interventions/DTPs have been made by any provider to improve Blood Pressure control since last CPP Visit: Patient reports none   Any recent hospitalizations or ED visits since last visit with CPP? Yes  What diet changes have been made to improve Blood Pressure Control?  Patient reports none  Adherence Review: Is the patient currently on ACE/ARB medication? Yes Does the patient have >5 day gap between last estimated fill dates? No  Star Rating Drugs:  Losartan 25 mg - Last filled 12/24/22 100 DS at [facility]   Chart Updates: Recent office visits:  12/31/22 Tillie Rung, LPN - Patient presented for [facility] Annual Wellness exam and other concerns. No medication changes.  Recent consult visits:  02/13/23 Satira Sark, RN - Patient presented for Anti coag visit.  Reading was 3.2  01/30/23 Spainhour, Crisoforo Oxford, PA-C (ENT) - Patient presented for Impacted cerumen of right ear. No medication changes.  12/10/22 Alver Sorrow, NP (Cardiology) - Patient presented for PAF and other concerns. No medication changes.  11/01/22 Spainhour, Crisoforo Oxford, PA-C (ENT)  - Patient presented for Ceruminosis bilateral. No medication changes.  Hospital visits:  Medication Reconciliation was completed by comparing discharge summary, patient's EMR and Pharmacy list, and upon discussion with patient.  Patient presented to [facility] ED at [facility] on 12/08/22 due to Lip Laceration. Patient was present for 1 hour.  New?Medications Started at [facility] Discharge:?? -started  none  Medication Changes at Hospital Discharge: -Changed  none  Medications Discontinued at Hospital Discharge: -Stopped  None  Medications that remain the same after Hospital Discharge:??  -All other medications will remain the same.    Medications: Outpatient Encounter Medications as of 02/21/2023  Medication Sig   ALPRAZolam (XANAX) 0.25 MG tablet Take 1 tablet by mouth at night as needed for insomnia   Cholecalciferol (VITAMIN D3) 1000 UNITS CAPS Take 2,000 Units by mouth every morning.   diltiazem (CARDIZEM CD) 240 MG 24 hr capsule TAKE 1 CAPSULE BY MOUTH EVERY MORNING   flecainide (TAMBOCOR) 100 MG tablet Take 1 tablet (100 mg total) by mouth 2 (two) times daily.   losartan (COZAAR) 25 MG tablet Take 1 tablet (25 mg total) by mouth daily.   metoprolol succinate (TOPROL-XL) 25 MG 24 hr tablet TAKE 1 TABLET EVERY DAY WITH OR IMMEDIATELY FOLLOWING A MEAL   metoprolol  succinate (TOPROL-XL) 25 MG 24 hr tablet Take 1 tablet (25 mg total) by mouth daily with or immediately after a meal.   Multiple Vitamin (MULTIVITAMIN) tablet Take 1 tablet by mouth daily.   neomycin-polymyxin-hydrocortisone (CORTISPORIN) OTIC solution Use 4 drops into both ears three times daily   potassium citrate  (UROCIT-K) 10 MEQ (1080 MG) SR tablet Take 1 tablet (10 mEq total) by mouth 3 (three) times daily.   rosuvastatin (CRESTOR) 40 MG tablet Take 1 tablet (40 mg total) by mouth daily.   sertraline (ZOLOFT) 50 MG tablet TAKE 1 TABLET BY MOUTH EVERY DAY   warfarin (COUMADIN) 5 MG tablet TAKE 1 TO 1 AND 1/2 TABLETS DAILY AS DIRECTED BY COUMADIN CLINIC   No facility-administered encounter medications on file as of 02/21/2023.    Recent Office Vitals: BP Readings from Last 3 Encounters:  12/10/22 128/80  12/08/22 (!) 143/85  06/05/22 116/60   Pulse Readings from Last 3 Encounters:  12/10/22 (!) 55  12/08/22 (!) 53  06/05/22 67    Wt Readings from Last 3 Encounters:  12/31/22 198 lb (89.8 kg)  12/10/22 202 lb (91.6 kg)  06/05/22 198 lb 9.6 oz (90.1 kg)     Kidney Function Lab Results  Component Value Date/Time   CREATININE 1.07 12/10/2022 04:11 PM   CREATININE 0.95 05/02/2022 09:00 AM   CREATININE 0.97 12/06/2016 09:50 AM   CREATININE 1.00 04/24/2016 08:59 AM   GFR 77.14 07/06/2015 03:07 PM   GFRNONAA >60 11/21/2019 05:02 AM   GFRAA >60 11/21/2019 05:02 AM       Latest Ref Rng & Units 12/10/2022    4:11 PM 05/02/2022    9:00 AM 03/09/2022    8:45 AM  BMP  Glucose 70 - 99 mg/dL 97  97  098   BUN 8 - 27 mg/dL 25  25  15    Creatinine 0.76 - 1.27 mg/dL 1.19  1.47  8.29   BUN/Creat Ratio 10 - 24 23  26  15    Sodium 134 - 144 mmol/L 144  140  142   Potassium 3.5 - 5.2 mmol/L 4.2  4.4  4.3   Chloride 96 - 106 mmol/L 106  105  103   CO2 20 - 29 mmol/L 21  22  19    Calcium 8.6 - 10.2 mg/dL 9.7  9.0  9.4        Pamala Duffel CMA Clinical Pharmacist Assistant [phone]

## 2023-02-22 DIAGNOSIS — N202 Calculus of kidney with calculus of ureter: Secondary | ICD-10-CM | POA: Diagnosis not present

## 2023-02-25 ENCOUNTER — Other Ambulatory Visit: Payer: Self-pay

## 2023-02-25 MED FILL — Warfarin Sodium Tab 5 MG: ORAL | 90 days supply | Qty: 135 | Fill #1 | Status: AC

## 2023-02-25 MED FILL — Sertraline HCl Tab 50 MG: ORAL | 90 days supply | Qty: 90 | Fill #0 | Status: AC

## 2023-03-07 ENCOUNTER — Encounter: Payer: Self-pay | Admitting: Gastroenterology

## 2023-03-11 ENCOUNTER — Telehealth: Payer: Self-pay

## 2023-03-11 ENCOUNTER — Other Ambulatory Visit: Payer: Self-pay

## 2023-03-11 ENCOUNTER — Ambulatory Visit: Payer: PPO | Admitting: Gastroenterology

## 2023-03-11 ENCOUNTER — Encounter: Payer: Self-pay | Admitting: Gastroenterology

## 2023-03-11 ENCOUNTER — Other Ambulatory Visit (HOSPITAL_COMMUNITY): Payer: Self-pay

## 2023-03-11 VITALS — BP 102/62 | HR 62 | Ht 73.0 in | Wt 202.0 lb

## 2023-03-11 DIAGNOSIS — Z1211 Encounter for screening for malignant neoplasm of colon: Secondary | ICD-10-CM | POA: Diagnosis not present

## 2023-03-11 DIAGNOSIS — Z7901 Long term (current) use of anticoagulants: Secondary | ICD-10-CM

## 2023-03-11 DIAGNOSIS — I480 Paroxysmal atrial fibrillation: Secondary | ICD-10-CM | POA: Diagnosis not present

## 2023-03-11 MED ORDER — NA SULFATE-K SULFATE-MG SULF 17.5-3.13-1.6 GM/177ML PO SOLN
1.0000 | Freq: Once | ORAL | 0 refills | Status: AC
  Filled 2023-03-11: qty 354, 1d supply, fill #0

## 2023-03-11 NOTE — Telephone Encounter (Signed)
[facility] Pre-operative Risk Assessment     Request for surgical clearance:     Endoscopy Procedure  What type of surgery is being performed?     Colonoscopy  When is this surgery scheduled?     05/03/23  What type of clearance is required ?   Pharmacy  Are there any medications that need to be held prior to surgery and how long? Coumadin 5 days   Practice name and name of physician performing surgery?      [facility]  What is your office phone and fax number?      Phone- [phone]  Fax- [phone]  Anesthesia type (None, local, MAC, general) ?       MAC

## 2023-03-11 NOTE — Telephone Encounter (Signed)
Patient with diagnosis of afib on warfarin for anticoagulation.    Procedure: colonoscopy Date of procedure: 05/03/23  CHA2DS2-VASc Score = 4  This indicates a 4.8% annual risk of stroke. The patient's score is based upon: CHF History: 0 HTN History: 1 Diabetes History: 0 Stroke History: 0 Vascular Disease History: 1 Age Score: 2 Gender Score: 0  Unclear PMH regarding CVA. "~2005 - had visual symptoms, was told by Dr. Patty Sermons this may have been a TIA but it's unclear." Have forwarded previous clearance to Dr Duke Salvia to comment on potential need for Lovenox bridge, she cleared pt to hold warfarin without bridging. Will carry forward same recommendation for this procedure.  CrCl 72mL/min Platelet count 215K  Per office protocol, patient can hold warfarin for 5 days prior to procedure. Patient will not need bridging with Lovenox (enoxaparin) around procedure.  **This guidance is not considered finalized until pre-operative APP has relayed final recommendations.**

## 2023-03-11 NOTE — Telephone Encounter (Signed)
  Patient Consent for Virtual Visit        Francis Campbell. has provided verbal consent on 03/11/2023 for a virtual visit (video or telephone).   CONSENT FOR VIRTUAL VISIT FOR:  Francis Campbell.  By participating in this virtual visit I agree to the following:  I hereby voluntarily request, consent and authorize [facility] and its employed or contracted physicians, physician assistants, nurse practitioners or other licensed health care professionals (the Practitioner), to provide me with telemedicine health care services (the "Services") as deemed necessary by the treating Practitioner. I acknowledge and consent to receive the Services by the Practitioner via telemedicine. I understand that the telemedicine visit will involve communicating with the Practitioner through live audiovisual communication technology and the disclosure of certain medical information by electronic transmission. I acknowledge that I have been given the opportunity to request an in-person assessment or other available alternative prior to the telemedicine visit and am voluntarily participating in the telemedicine visit.  I understand that I have the right to withhold or withdraw my consent to the use of telemedicine in the course of my care at any time, without affecting my right to future care or treatment, and that the Practitioner or I may terminate the telemedicine visit at any time. I understand that I have the right to inspect all information obtained and/or recorded in the course of the telemedicine visit and may receive copies of available information for a reasonable fee.  I understand that some of the potential risks of receiving the Services via telemedicine include:  Delay or interruption in medical evaluation due to technological equipment failure or disruption; Information transmitted may not be sufficient (e.g. poor resolution of images) to allow for appropriate medical decision making by the  Practitioner; and/or  In rare instances, security protocols could fail, causing a breach of personal health information.  Furthermore, I acknowledge that it is my responsibility to provide information about my medical history, conditions and care that is complete and accurate to the best of my ability. I acknowledge that Practitioner's advice, recommendations, and/or decision may be based on factors not within their control, such as incomplete or inaccurate data provided by me or distortions of diagnostic images or specimens that may result from electronic transmissions. I understand that the practice of medicine is not an exact science and that Practitioner makes no warranties or guarantees regarding treatment outcomes. I acknowledge that a copy of this consent can be made available to me via my patient portal [facility] MyChart), or I can request a printed copy by calling the office of [facility].    I understand that my insurance will be billed for this visit.   I have read or had this consent read to me. I understand the contents of this consent, which adequately explains the benefits and risks of the Services being provided via telemedicine.  I have been provided ample opportunity to ask questions regarding this consent and the Services and have had my questions answered to my satisfaction. I give my informed consent for the services to be provided through the use of telemedicine in my medical care

## 2023-03-11 NOTE — Patient Instructions (Signed)
_______________________________________________________  If your blood pressure at your visit was 140/90 or greater, please contact your primary care physician to follow up on this.  _______________________________________________________  If you are age 76 or older, your body mass index should be between 23-30. Your Body mass index is 26.65 kg/m. If this is out of the aforementioned range listed, please consider follow up with your Primary Care Provider.  If you are age 26 or younger, your body mass index should be between 19-25. Your Body mass index is 26.65 kg/m. If this is out of the aformentioned range listed, please consider follow up with your Primary Care Provider.   You have been scheduled for a colonoscopy. Please follow written instructions given to you at your visit today.  Please pick up your prep supplies at the pharmacy within the next 1-3 days. If you use inhalers (even only as needed), please bring them with you on the day of your procedure.   ________________________________________________________  [facility] providers would like to encourage you to use [facility] to communicate with providers for non-urgent requests or questions.  Due to long hold times on the telephone, sending your provider a message by [facility] may be a faster and more efficient way to get a response.  Please allow 48 business hours for a response.  Please remember that this is for non-urgent requests.  _______________________________________________________   It was a pleasure to see you today!  Thank you for trusting me with your gastrointestinal care!    Scott E.Tomasa Rand, MD

## 2023-03-11 NOTE — Progress Notes (Signed)
HPI : Francis Campbell. is a 77 y.o. male with a history of atrial fibrillation on coumadin who is referred to [place] by Kristian Covey, MD for colon cancer screening.  He has no chronic GI symptoms or complaints today.   The patient denies any chronic lower GI symptoms to include abdominal pain, constipation, diarrhea or blood in his stool. He also denies any chronic upper GI symptoms to include frequent heartburn/acid regurgitation, nausea/vomiting, dysphagia, dyspepsia, or early satiety.  He used to have issues with GERD, but he said once he stopped drinking carbonated beverages, his GERD symptoms resolved.  He does not take any acid reducing medications.  He has long standing a-fib on coumadin, but denies any symptoms.  Specifically, no problems with palpitations, light-headedness/dizziness, shortness of breath or chest pain/pressure. He saw his cardiologist earlier this yearrr with no recommendations for any further evaluation or changes in medications   His last colonoscopy was in 2013 and was normal.  EGD Mar 2020 (Dr. Christella Hartigan)  Mild erythema/granularity in the stomach, otherwise normal EGD  Colonoscopy Feb 2013 Dr. Jarold Motto Normal     Past Medical History:  Diagnosis Date   Allergy    Aortic regurgitation    Arthritis    Ascending aortic aneurysm (HCC) 04/30/2022   Asthma    only as child   Atrial flutter (HCC)    a. Dx 09/2012.   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   TIA (transient ischemic attack)    ~2005 - had visual symptoms, was told by Dr. Patty Sermons this may have been a TIA but it's unclear   Valvular heart disease    Mild AI, ASclerosis without AS by echo 10/2011     Past Surgical History:  Procedure Laterality Date    CARDIOVERSION N/A 05/08/2013   Procedure: CARDIOVERSION;  Surgeon: Cassell Clement, MD;  Location: [facility] ENDOSCOPY;  Service: Cardiovascular;  Laterality: N/A;   CATARACT EXTRACTION     Right 12/2019   CATARACT EXTRACTION     CYSTOSCOPY/RETROGRADE/URETEROSCOPY/STONE EXTRACTION WITH BASKET Right 02/12/2013   Procedure: CYSTOSCOPY/RETROGRADE/URETEROSCOPY/STONE EXTRACTION WITH BASKET, WITH  STENT PLACEMENT RIGHT URETERAL DILATION;  Surgeon: Anner Crete, MD;  Location: [facility]S;  Service: Urology;  Laterality: Right;   EYE SURGERY Bilateral 1950, 1960, 1964   left, right, left   HERNIA REPAIR  2005   KNEE SURGERY Bilateral 1990, 2000   LITHOTRIPSY     ROTATOR CUFF REPAIR Left 2000   SHOULDER SURGERY Right 04/24/2022   TONSILLECTOMY     Family History  Problem Relation Age of Onset   Stroke Mother    Hypertension Mother    Kidney disease Mother        stones   Nephrolithiasis Brother    Arthritis Paternal Grandmother    Heart disease Paternal Grandmother    Leukemia Father    Heart disease Father    Nephrolithiasis Father    Kidney cancer Son    Appendicitis Son    Heart attack Neg Hx    Colon cancer Neg Hx    Esophageal cancer Neg Hx    Pancreatic cancer Neg Hx    Stomach cancer Neg Hx    Liver  disease Neg Hx    Social History   Tobacco Use   Smoking status: Never   Smokeless tobacco: Never  Vaping Use   Vaping Use: Never used  Substance Use Topics   Alcohol use: No   Drug use: No   Current Outpatient Medications  Medication Sig Dispense Refill   ALPRAZolam (XANAX) 0.25 MG tablet Take 1 tablet by mouth at night as needed for insomnia 30 tablet 5   Cholecalciferol (VITAMIN D3) 1000 UNITS CAPS Take 2,000 Units by mouth every morning.     diltiazem (CARDIZEM CD) 240 MG 24 hr capsule TAKE 1 CAPSULE BY MOUTH EVERY MORNING 90 capsule 4   flecainide (TAMBOCOR) 100 MG tablet Take 1 tablet (100 mg total) by mouth 2 (two) times daily. 180 tablet 3   losartan (COZAAR) 25 MG tablet  Take 1 tablet (25 mg total) by mouth daily. 90 tablet 3   metoprolol succinate (TOPROL-XL) 25 MG 24 hr tablet Take 1 tablet (25 mg total) by mouth daily with or immediately after a meal. 90 tablet 1   Multiple Vitamin (MULTIVITAMIN) tablet Take 1 tablet by mouth daily.     potassium citrate (UROCIT-K) 10 MEQ (1080 MG) SR tablet Take 1 tablet (10 mEq total) by mouth 3 (three) times daily. 270 tablet 3   rosuvastatin (CRESTOR) 40 MG tablet Take 1 tablet (40 mg total) by mouth daily. 90 tablet 3   sertraline (ZOLOFT) 50 MG tablet Take 1 tablet (50 mg total) by mouth daily. 90 tablet 0   warfarin (COUMADIN) 5 MG tablet TAKE 1 TO 1 AND 1/2 TABLETS DAILY AS DIRECTED BY COUMADIN CLINIC 135 tablet 1   No current facility-administered medications for this visit.   Allergies  Allergen Reactions   Chocolate Palpitations   Chocolate Flavor Shortness Of Breath    Other reaction(s): Respiratory Distress   Dextroamphetamine Shortness Of Breath    Other reaction(s): Respiratory Distress   Dextromethorphan Palpitations   Flomax [Tamsulosin Hcl] Other (See Comments)    ? Causes A Fib   Floxin [Ofloxacin] Other (See Comments)    hallucinations   Guaifenesin Shortness Of Breath   Lisinopril Cough   Tavist Allergy [Clemastine Fumarate] Other (See Comments)    A fib worse   Clemastine     Other reaction(s): Irregular Heart Rate   Robitussin Cold+Flu Daytime  [Dm-Phenylephrine-Acetaminophen]     Other reaction(s): Respiratory Distress   Tamsulosin Other (See Comments)    Other reaction(s): Irregular Heart Rate   Amiodarone Other (See Comments)    Abn  thyroid   Robitussin Cf [Pseudoephedrine-Dm-Gg] Palpitations     Review of Systems: All systems reviewed and negative except where noted in HPI.    No results found.  Physical Exam: BP 102/62   Pulse 62   Ht 6\' 1"  (1.854 m)   Wt 202 lb (91.6 kg)   BMI 26.65 kg/m  Constitutional: Pleasant,well-developed, Caucasian male in no acute  distress. HEENT: Normocephalic and atraumatic. Conjunctivae are normal. No scleral icterus. Neck supple.  Cardiovascular: Normal rate, irregular rhythm.  Pulmonary/chest: Effort normal and breath sounds normal. No wheezing, rales or rhonchi. Abdominal: Soft, nondistended, nontender. Bowel sounds active throughout. There are no masses palpable. No hepatomegaly. Extremities: no edema Neurological: Alert and oriented to person place and time. Skin: Skin is warm and dry. No rashes noted. Psychiatric: Normal mood and affect. Behavior is normal.  CBC    Component Value Date/Time   WBC 9.4 12/10/2022 1611   WBC 7.5 11/21/2019 0502  RBC 6.01 (H) 12/10/2022 1611   RBC 5.04 11/21/2019 0502   HGB 16.9 12/10/2022 1611   HCT 51.8 (H) 12/10/2022 1611   PLT 215 12/10/2022 1611   MCV 86 12/10/2022 1611   MCH 28.1 12/10/2022 1611   MCH 28.6 11/21/2019 0502   MCHC 32.6 12/10/2022 1611   MCHC 31.6 11/21/2019 0502   RDW 13.2 12/10/2022 1611   LYMPHSABS 1.2 04/02/2018 0917   LYMPHSABS 1.1 07/23/2017 0941   MONOABS 0.6 04/02/2018 0917   EOSABS 0.1 04/02/2018 0917   EOSABS 0.1 07/23/2017 0941   BASOSABS 0.0 04/02/2018 0917   BASOSABS 0.0 07/23/2017 0941    CMP     Component Value Date/Time   NA 144 12/10/2022 1611   K 4.2 12/10/2022 1611   CL 106 12/10/2022 1611   CO2 21 12/10/2022 1611   GLUCOSE 97 12/10/2022 1611   GLUCOSE 84 11/21/2019 0502   BUN 25 12/10/2022 1611   CREATININE 1.07 12/10/2022 1611   CREATININE 0.97 12/06/2016 0950   CALCIUM 9.7 12/10/2022 1611   PROT 6.8 07/04/2022 0939   ALBUMIN 4.5 07/04/2022 0939   AST 36 07/04/2022 0939   ALT 41 07/04/2022 0939   ALKPHOS 87 07/04/2022 0939   BILITOT 0.3 07/04/2022 0939   GFRNONAA >60 11/21/2019 0502   GFRAA >60 11/21/2019 0502       Latest Ref Rng & Units 12/10/2022    4:11 PM 03/01/2022    3:15 PM 11/21/2019    5:02 AM  CBC EXTENDED  WBC 3.4 - 10.8 x10E3/uL 9.4  9.3  7.5   RBC 4.14 - 5.80 x10E6/uL 6.01  5.94  5.04    Hemoglobin 13.0 - 17.7 g/dL 82.9  56.2  13.0   HCT 37.5 - 51.0 % 51.8  50.9  45.6   Platelets 150 - 450 x10E3/uL 215  230  174       ASSESSMENT AND PLAN:  77 year old male with atrial fibrillation on coumadin, due for colon cancer screening.  He has no GI symptoms and no concerning cardiopulmonary comorbidities.  Will schedule patient for routine screening colonoscopy.  Will plan to hold coumadin for 5 days prior to colonoscopy.  Will message patient's cardiologist to ensure this is a reasonable risk.  Colon cancer screening - Colonoscopy - Hold coumadin x 5 days; clearance per Dr. Duke Salvia  The details, risks (including bleeding, perforation, infection, missed lesions, medication reactions and possible hospitalization or surgery if complications occur), benefits, and alternatives to colonoscopy with possible biopsy and possible polypectomy were discussed with the patient and he consents to proceed.   Lorilee Cafarella E. Tomasa Rand, MD [facility]   Burchette, Elberta Fortis, MD

## 2023-03-11 NOTE — Telephone Encounter (Signed)
Spoke with patient who is agreeable to do a tele visit on 7/29 at 9 am. Consent given, med rec needs to be completed.

## 2023-03-11 NOTE — Telephone Encounter (Signed)
Spoke with patient who would like for me to call he back at a later time to schedule a tele visit.

## 2023-03-11 NOTE — Telephone Encounter (Signed)
   Name: Francis Campbell.  DOB: [birth year]  MRN: [id number]  Primary Cardiologist: Chilton Si, MD   Preoperative team, please contact this patient and set up a phone call appointment for further preoperative risk assessment. Please obtain consent and complete medication review. Thank you for your help.  I confirm that guidance regarding antiplatelet and oral anticoagulation therapy has been completed and, if necessary, noted below.  Pharmacy has provided recommendations for holding anticoagulation.   Ronney Asters, NP 03/11/2023, 1:20 PM [facility]

## 2023-03-14 DIAGNOSIS — N202 Calculus of kidney with calculus of ureter: Secondary | ICD-10-CM | POA: Diagnosis not present

## 2023-03-18 ENCOUNTER — Other Ambulatory Visit (HOSPITAL_COMMUNITY): Payer: Self-pay

## 2023-03-18 ENCOUNTER — Other Ambulatory Visit: Payer: Self-pay | Admitting: Family Medicine

## 2023-03-18 MED ORDER — ALPRAZOLAM 0.25 MG PO TABS
ORAL_TABLET | ORAL | 5 refills | Status: DC
  Filled 2023-03-18: qty 30, 30d supply, fill #0
  Filled 2023-04-22: qty 30, 30d supply, fill #1
  Filled 2023-05-21: qty 30, 30d supply, fill #2
  Filled 2023-06-17: qty 30, 30d supply, fill #3
  Filled 2023-07-15: qty 30, 30d supply, fill #4
  Filled 2023-08-19: qty 30, 30d supply, fill #5

## 2023-03-18 MED FILL — Diltiazem HCl Coated Beads Cap ER 24HR 240 MG: ORAL | 90 days supply | Qty: 90 | Fill #2 | Status: AC

## 2023-03-18 NOTE — Telephone Encounter (Signed)
Set up office follow-up soon to reassess  Francis Covey MD [facility]

## 2023-03-19 ENCOUNTER — Other Ambulatory Visit: Payer: Self-pay

## 2023-03-20 ENCOUNTER — Ambulatory Visit: Payer: PPO | Attending: Cardiovascular Disease | Admitting: *Deleted

## 2023-03-20 DIAGNOSIS — Z5181 Encounter for therapeutic drug level monitoring: Secondary | ICD-10-CM

## 2023-03-20 DIAGNOSIS — I480 Paroxysmal atrial fibrillation: Secondary | ICD-10-CM

## 2023-03-20 LAB — POCT INR: INR: 2.4 (ref 2.0–3.0)

## 2023-03-20 NOTE — Patient Instructions (Addendum)
Description   Continue taking warfarin 5mg  daily except 7.5mg  every Fridays.  Recheck INR in 5 weeks, pending procedure. Coumadin Clinic [phone]

## 2023-04-01 ENCOUNTER — Other Ambulatory Visit: Payer: Self-pay

## 2023-04-15 ENCOUNTER — Ambulatory Visit: Payer: PPO | Attending: Cardiovascular Disease

## 2023-04-15 DIAGNOSIS — Z01810 Encounter for preprocedural cardiovascular examination: Secondary | ICD-10-CM

## 2023-04-15 NOTE — Progress Notes (Signed)
Virtual Visit via Telephone Note   Because of Francis Castagno Guglielmo Jr.'s co-morbid illnesses, he is at least at moderate risk for complications without adequate follow up.  This format is felt to be most appropriate for this patient at this time.  The patient did not have access to video technology/had technical difficulties with video requiring transitioning to audio format only (telephone).  All issues noted in this document were discussed and addressed.  No physical exam could be performed with this format.  Please refer to the patient's chart for his consent to telehealth for [facility].  Evaluation Performed:  Preoperative cardiovascular risk assessment _____________   Date:  04/15/2023   Patient ID:  Francis [place]., DOB 02/26/1946, MRN [id number] Patient Location:  Home Provider location:   Office  Primary Care Provider:  Kristian Covey, MD Primary Cardiologist:  Chilton Si, MD  Chief Complaint / Patient Profile   77 y.o. y/o male with a h/o paroxysmal AF, HTN, HLD, Crohn's disease who is pending colonoscopy and presents today for telephonic preoperative cardiovascular risk assessment.  History of Present Illness    Francis Defer. is a 77 y.o. male who presents via audio/video conferencing for a telehealth visit today.  Pt was last seen in cardiology clinic on 12/10/2022 by [name], NP.  At that time Francis Roughen. was doing well but noted episode of AF which resolved with a dose of flecainide. The patient is now pending procedure as outlined above. Since his last visit, he has been doing well with no new cardiac complaints.  He reports no episodes of atrial fibrillation since his previous office visit.  He denies chest pain, shortness of breath, lower extremity edema, fatigue, palpitations, melena, hematuria, hemoptysis, diaphoresis, weakness, presyncope, syncope, orthopnea, and PND.    Past Medical History    Past Medical History:  Diagnosis  Date   Allergy    Aortic regurgitation    Arthritis    Ascending aortic aneurysm (HCC) 04/30/2022   Asthma    only as child   Atrial flutter (HCC)    a. Dx 09/2012.   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   TIA (transient ischemic attack)    ~2005 - had visual symptoms, was told by Dr. Patty Sermons this may have been a TIA but it's unclear   Valvular heart disease    Mild AI, ASclerosis without AS by echo 10/2011   Past Surgical History:  Procedure Laterality Date   CARDIOVERSION N/A 05/08/2013   Procedure: CARDIOVERSION;  Surgeon: Cassell Clement, MD;  Location: [facility] ENDOSCOPY;  Service: Cardiovascular;  Laterality: N/A;   CATARACT EXTRACTION     Right 12/2019   CATARACT EXTRACTION     CYSTOSCOPY/RETROGRADE/URETEROSCOPY/STONE EXTRACTION WITH BASKET Right 02/12/2013   Procedure: CYSTOSCOPY/RETROGRADE/URETEROSCOPY/STONE EXTRACTION WITH BASKET, WITH  STENT PLACEMENT RIGHT URETERAL DILATION;  Surgeon: Anner Crete, MD;  Location: [facility];  Service: Urology;  Laterality: Right;   EYE SURGERY Bilateral 1950, 1960, 1964   left, right, left   HERNIA REPAIR  2005   KNEE SURGERY Bilateral 1990, 2000   LITHOTRIPSY     ROTATOR CUFF REPAIR Left 2000   SHOULDER SURGERY  Right 04/24/2022   TONSILLECTOMY      Allergies  Allergies  Allergen Reactions   Chocolate Palpitations   Chocolate Flavor Shortness Of Breath    Other reaction(s): Respiratory Distress   Dextroamphetamine Shortness Of Breath    Other reaction(s): Respiratory Distress   Dextromethorphan Palpitations   Flomax [Tamsulosin Hcl] Other (See Comments)    ? Causes A Fib   Floxin [Ofloxacin] Other (See Comments)    hallucinations   Guaifenesin Shortness Of Breath   Lisinopril Cough   Tavist  Allergy [Clemastine Fumarate] Other (See Comments)    A fib worse   Clemastine     Other reaction(s): Irregular Heart Rate   Robitussin Cold+Flu Daytime  [Dm-Phenylephrine-Acetaminophen]     Other reaction(s): Respiratory Distress   Tamsulosin Other (See Comments)    Other reaction(s): Irregular Heart Rate   Amiodarone Other (See Comments)    Abn  thyroid   Robitussin Cf [Pseudoephedrine-Dm-Gg] Palpitations    Home Medications    Prior to Admission medications   Medication Sig Start Date End Date Taking? Authorizing Provider  ALPRAZolam Prudy Feeler) 0.25 MG tablet Take 1 tablet by mouth at night as needed for insomnia 03/18/23   Burchette, Elberta Fortis, MD  Cholecalciferol (VITAMIN D3) 1000 UNITS CAPS Take 2,000 Units by mouth every morning.    [provider]  diltiazem (CARDIZEM CD) 240 MG 24 hr capsule TAKE 1 CAPSULE BY MOUTH EVERY MORNING 03/19/22   Chilton Si, MD  flecainide (TAMBOCOR) 100 MG tablet Take 1 tablet (100 mg total) by mouth 2 (two) times daily. 12/24/22   Chilton Si, MD  losartan (COZAAR) 25 MG tablet Take 1 tablet (25 mg total) by mouth daily. 12/24/22   Chilton Si, MD  metoprolol succinate (TOPROL-XL) 25 MG 24 hr tablet Take 1 tablet (25 mg total) by mouth daily with or immediately after a meal. 02/18/23   Chilton Si, MD  Multiple Vitamin (MULTIVITAMIN) tablet Take 1 tablet by mouth daily.    [provider]  potassium citrate (UROCIT-K) 10 MEQ (1080 MG) SR tablet Take 1 tablet (10 mEq total) by mouth 3 (three) times daily. 10/26/22     rosuvastatin (CRESTOR) 40 MG tablet Take 1 tablet (40 mg total) by mouth daily. 05/14/22   Alver Sorrow, NP  sertraline (ZOLOFT) 50 MG tablet Take 1 tablet (50 mg total) by mouth daily. 03/19/22   Burchette, Elberta Fortis, MD  warfarin (COUMADIN) 5 MG tablet TAKE 1 TO 1 AND 1/2 TABLETS DAILY AS DIRECTED BY COUMADIN CLINIC 03/19/22   Chilton Si, MD    Physical Exam    Vital Signs:  Francis Roughen. does not  have vital signs available for review today.  Given telephonic nature of communication, physical exam is limited. AAOx3. NAD. Normal affect.  Speech and respirations are unlabored.  Accessory Clinical Findings    None  Assessment & Plan    1.  Preoperative Cardiovascular Risk Assessment:  -Patient's RCRI score is 0.4%  The patient affirms he has been doing well without any new cardiac symptoms. They are able to achieve 5 METS without cardiac limitations. Therefore, based on ACC/AHA guidelines, the patient would be at acceptable risk for the planned procedure without further cardiovascular testing. The patient was advised that if he develops new symptoms prior to surgery to contact our office to arrange for a follow-up visit, and he verbalized understanding.   Per office protocol, patient can hold warfarin for 5 days prior to procedure. Patient will not need  bridging with Lovenox (enoxaparin) around procedure.   The patient was advised that if he develops new symptoms prior to surgery to contact our office to arrange for a follow-up visit, and he verbalized understanding.    A copy of this note will be routed to requesting surgeon.  Time:   Today, I have spent 5 minutes with the patient with telehealth technology discussing medical history, symptoms, and management plan.     Napoleon Form, Leodis Rains, NP  04/15/2023, 7:06 AM

## 2023-04-22 ENCOUNTER — Other Ambulatory Visit (HOSPITAL_COMMUNITY): Payer: Self-pay

## 2023-04-22 ENCOUNTER — Other Ambulatory Visit: Payer: Self-pay

## 2023-04-24 ENCOUNTER — Encounter: Payer: Self-pay | Admitting: Gastroenterology

## 2023-04-24 ENCOUNTER — Ambulatory Visit: Payer: PPO | Attending: Cardiovascular Disease | Admitting: *Deleted

## 2023-04-24 DIAGNOSIS — I480 Paroxysmal atrial fibrillation: Secondary | ICD-10-CM

## 2023-04-24 DIAGNOSIS — Z5181 Encounter for therapeutic drug level monitoring: Secondary | ICD-10-CM | POA: Diagnosis not present

## 2023-04-24 LAB — POCT INR: INR: 2.3 (ref 2.0–3.0)

## 2023-04-24 NOTE — Patient Instructions (Addendum)
Description   Continue taking warfarin 5mg  daily except 7.5mg  every Fridays. Refer to instructions for holding warfarin x 5 days. Recheck INR in 1 week post procedure. Coumadin Clinic [phone]    No warfarin 04/28/23 through 05/02/23 after the procedure and when told to do resume warfarin. Once resumed take an extra 1/2 tablet of warfarin for 2 days. On Friday 05/03/23 after procedure, take 2 tablets of warfarin and on Saturday 05/04/23 take 1.5 tablets then resume normal dose of warfarin.

## 2023-05-02 DIAGNOSIS — H6121 Impacted cerumen, right ear: Secondary | ICD-10-CM | POA: Diagnosis not present

## 2023-05-03 ENCOUNTER — Encounter: Payer: Self-pay | Admitting: Gastroenterology

## 2023-05-03 ENCOUNTER — Ambulatory Visit (AMBULATORY_SURGERY_CENTER): Payer: PPO | Admitting: Gastroenterology

## 2023-05-03 VITALS — BP 138/78 | HR 57 | Temp 98.0°F | Resp 12 | Ht 73.0 in | Wt 202.0 lb

## 2023-05-03 DIAGNOSIS — D124 Benign neoplasm of descending colon: Secondary | ICD-10-CM | POA: Diagnosis not present

## 2023-05-03 DIAGNOSIS — D175 Benign lipomatous neoplasm of intra-abdominal organs: Secondary | ICD-10-CM | POA: Diagnosis not present

## 2023-05-03 DIAGNOSIS — K635 Polyp of colon: Secondary | ICD-10-CM | POA: Diagnosis not present

## 2023-05-03 DIAGNOSIS — Z1211 Encounter for screening for malignant neoplasm of colon: Secondary | ICD-10-CM | POA: Diagnosis not present

## 2023-05-03 DIAGNOSIS — D123 Benign neoplasm of transverse colon: Secondary | ICD-10-CM | POA: Diagnosis not present

## 2023-05-03 DIAGNOSIS — D125 Benign neoplasm of sigmoid colon: Secondary | ICD-10-CM

## 2023-05-03 DIAGNOSIS — K639 Disease of intestine, unspecified: Secondary | ICD-10-CM | POA: Diagnosis not present

## 2023-05-03 DIAGNOSIS — K5140 Inflammatory polyps of colon without complications: Secondary | ICD-10-CM | POA: Diagnosis not present

## 2023-05-03 DIAGNOSIS — K529 Noninfective gastroenteritis and colitis, unspecified: Secondary | ICD-10-CM | POA: Diagnosis not present

## 2023-05-03 MED ORDER — SODIUM CHLORIDE 0.9 % IV SOLN: 500.0000 mL | Freq: Once | INTRAVENOUS | Status: DC

## 2023-05-03 NOTE — Progress Notes (Signed)
To pacu, VSS. Report to Rn.tb 

## 2023-05-03 NOTE — Patient Instructions (Addendum)
Restart coumadin tomorrow as previously recommended  Await pathology results   YOU HAD AN ENDOSCOPIC PROCEDURE TODAY AT THE [facility]:   Refer to the procedure report that was given to you for any specific questions about what was found during the examination.  If the procedure report does not answer your questions, please call your gastroenterologist to clarify.  If you requested that your care partner not be given the details of your procedure findings, then the procedure report has been included in a sealed envelope for you to review at your convenience later.  YOU SHOULD EXPECT: Some feelings of bloating in the abdomen. Passage of more gas than usual.  Walking can help get rid of the air that was put into your GI tract during the procedure and reduce the bloating. If you had a lower endoscopy (such as a colonoscopy or flexible sigmoidoscopy) you may notice spotting of blood in your stool or on the toilet paper. If you underwent a bowel prep for your procedure, you may not have a normal bowel movement for a few days.  Please Note:  You might notice some irritation and congestion in your nose or some drainage.  This is from the oxygen used during your procedure.  There is no need for concern and it should clear up in a day or so.  SYMPTOMS TO REPORT IMMEDIATELY:  Following lower endoscopy (colonoscopy or flexible sigmoidoscopy):  Excessive amounts of blood in the stool  Significant tenderness or worsening of abdominal pains  Swelling of the abdomen that is new, acute  Fever of 100F or higher  For urgent or emergent issues, a gastroenterologist can be reached at any hour by calling [phone]. Do not use MyChart messaging for urgent concerns.    DIET:  We do recommend a small meal at first, but then you may proceed to your regular diet.  Drink plenty of fluids but you should avoid alcoholic beverages for 24 hours.  ACTIVITY:  You should plan to take it easy for the rest of  today and you should NOT DRIVE or use heavy machinery until tomorrow (because of the sedation medicines used during the test).    FOLLOW UP: Our staff will call the number listed on your records the next business day following your procedure.  We will call around 7:15- 8:00 am to check on you and address any questions or concerns that you may have regarding the information given to you following your procedure. If we do not reach you, we will leave a message.     If any biopsies were taken you will be contacted by phone or by letter within the next 1-3 weeks.  Please call us at [phone] if you have not heard about the biopsies in 3 weeks.    SIGNATURES/CONFIDENTIALITY: You and/or your care partner have signed paperwork which will be entered into your electronic medical record.  These signatures attest to the fact that that the information above on your After Visit Summary has been reviewed and is understood.  Full responsibility of the confidentiality of this discharge information lies with you and/or your care-partner.

## 2023-05-03 NOTE — Progress Notes (Signed)
[facility] History and Physical   Primary Care Physician:  Kristian Covey, MD   Reason for Procedure:   Colon cancer screening  Plan:    Screening colonoscopy     HPI: Francis Campbell. is a 77 y.o. male undergoing average risk screening colonoscopy.  He has no family history of colon cancer and no chronic GI symptoms. He had a normal colonoscopy in Feb 2013.  He takes coumadin for a-fib, last dose 8/11.   Past Medical History:  Diagnosis Date   Allergy    Aortic regurgitation    Arthritis    Ascending aortic aneurysm (HCC) 04/30/2022   Asthma    only as child   Atrial flutter (HCC)    a. Dx 09/2012.   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   TIA (transient ischemic attack)    ~2005 - had visual symptoms, was told by Dr. Patty Sermons this may have been a TIA but it's unclear   Valvular heart disease    Mild AI, ASclerosis without AS by echo 10/2011    Past Surgical History:  Procedure Laterality Date   CARDIOVERSION N/A 05/08/2013   Procedure: CARDIOVERSION;  Surgeon: Cassell Clement, MD;  Location: [facility] END[facility]COPY;  Service: Cardiovascular;  Laterality: N/A;   CATARACT EXTRACTION     Right 12/2019   CATARACT EXTRACTION     CYSTOSCOPY/RETROGRADE/URETEROSCOPY/STONE EXTRACTION WITH BASKET Right 02/12/2013   Procedure: CYSTOSCOPY/RETROGRADE/URETEROSCOPY/STONE EXTRACTION WITH BASKET, WITH  STENT PLACEMENT RIGHT URETERAL DILATION;  Surgeon: Anner Crete, MD;  Location: [facility] ORS;  Service: Urology;  Laterality: Right;   EYE SURGERY Bilateral 1950, 1960, 1964   left, right, left   HERNIA REPAIR  2005   KNEE SURGERY Bilateral 1990, 2000   LITHOTRIPSY     ROTATOR CUFF REPAIR Left 2000   SHOULDER SURGERY Right 04/24/2022    TONSILLECTOMY      Prior to Admission medications   Medication Sig Start Date End Date Taking? Authorizing Provider  ALPRAZolam Prudy Feeler) 0.25 MG tablet Take 1 tablet by mouth at night as needed for insomnia 03/18/23  Yes Burchette, Elberta Fortis, MD  Cholecalciferol (VITAMIN D3) 1000 UNITS CAPS Take 2,000 Units by mouth every morning.   Yes [provider]  diltiazem (CARDIZEM CD) 240 MG 24 hr capsule TAKE 1 CAPSULE BY MOUTH EVERY MORNING 03/19/22  Yes Chilton Si, MD  flecainide (TAMBOCOR) 100 MG tablet Take 1 tablet (100 mg total) by mouth 2 (two) times daily. 12/24/22  Yes Chilton Si, MD  losartan (COZAAR) 25 MG tablet Take 1 tablet (25 mg total) by mouth daily. 12/24/22  Yes Chilton Si, MD  metoprolol succinate (TOPROL-XL) 25 MG 24 hr tablet Take 1 tablet (25 mg total) by mouth daily with or immediately after a meal. 02/18/23  Yes Chilton Si, MD  Multiple Vitamin (MULTIVITAMIN) tablet Take 1 tablet by mouth daily.   Yes [provider]  potassium citrate (UROCIT-K) 10 MEQ (1080 MG) SR tablet Take 1 tablet (10 mEq total) by mouth 3 (three) times daily. 10/26/22  Yes   rosuvastatin (CRESTOR) 40 MG tablet Take 1 tablet (40 mg total) by mouth daily. 05/14/22  Yes Alver Sorrow, NP  sertraline (ZOLOFT)  50 MG tablet Take 1 tablet (50 mg total) by mouth daily. 03/19/22  Yes Burchette, Elberta Fortis, MD  warfarin (COUMADIN) 5 MG tablet TAKE 1 TO 1 AND 1/2 TABLETS DAILY AS DIRECTED BY COUMADIN CLINIC 03/19/22   Chilton Si, MD    Current Outpatient Medications  Medication Sig Dispense Refill   ALPRAZolam (XANAX) 0.25 MG tablet Take 1 tablet by mouth at night as needed for insomnia 30 tablet 5   Cholecalciferol (VITAMIN D3) 1000 UNITS CAPS Take 2,000 Units by mouth every morning.     diltiazem (CARDIZEM CD) 240 MG 24 hr capsule TAKE 1 CAPSULE BY MOUTH EVERY MORNING 90 capsule 4   flecainide (TAMBOCOR) 100 MG tablet Take 1 tablet (100 mg total) by mouth 2 (two) times daily.  180 tablet 3   losartan (COZAAR) 25 MG tablet Take 1 tablet (25 mg total) by mouth daily. 90 tablet 3   metoprolol succinate (TOPROL-XL) 25 MG 24 hr tablet Take 1 tablet (25 mg total) by mouth daily with or immediately after a meal. 90 tablet 1   Multiple Vitamin (MULTIVITAMIN) tablet Take 1 tablet by mouth daily.     potassium citrate (UROCIT-K) 10 MEQ (1080 MG) SR tablet Take 1 tablet (10 mEq total) by mouth 3 (three) times daily. 270 tablet 3   rosuvastatin (CRESTOR) 40 MG tablet Take 1 tablet (40 mg total) by mouth daily. 90 tablet 3   sertraline (ZOLOFT) 50 MG tablet Take 1 tablet (50 mg total) by mouth daily. 90 tablet 0   warfarin (COUMADIN) 5 MG tablet TAKE 1 TO 1 AND 1/2 TABLETS DAILY AS DIRECTED BY COUMADIN CLINIC 135 tablet 1   Current Facility-Administered Medications  Medication Dose Route Frequency Provider Last Rate Last Admin   0.9 %  sodium chloride infusion  500 mL Intravenous Once Jenel Lucks, MD        Allergies as of 05/03/2023 - Review Complete 05/03/2023  Allergen Reaction Noted   Chocolate Palpitations 09/15/2012   Chocolate flavor Shortness Of Breath 08/29/2018   Dextroamphetamine Shortness Of Breath 08/29/2018   Dextromethorphan Palpitations 10/03/2012   Flomax [tamsulosin hcl] Other (See Comments) 10/17/2012   Floxin [ofloxacin] Other (See Comments) 08/08/2011   Guaifenesin Shortness Of Breath 08/29/2018   Lisinopril Cough 12/07/2010   Tavist allergy [clemastine fumarate] Other (See Comments) 12/07/2010   Clemastine  11/20/2019   Robitussin cold+flu daytime  [dm-phenylephrine-acetaminophen]  11/20/2019   Tamsulosin Other (See Comments) 08/01/2018   Amiodarone Other (See Comments) 12/07/2010   Robitussin cf [pseudoephedrine-dm-gg] Palpitations 09/09/2011    Family History  Problem Relation Age of Onset   Stroke Mother    Hypertension Mother    Kidney disease Mother        stones   Nephrolithiasis Brother    Arthritis Paternal Grandmother     Heart disease Paternal Grandmother    Leukemia Father    Heart disease Father    Nephrolithiasis Father    Kidney cancer Son    Appendicitis Son    Heart attack Neg Hx    Colon cancer Neg Hx    Esophageal cancer Neg Hx    Pancreatic cancer Neg Hx    Stomach cancer Neg Hx    Liver disease Neg Hx     Social History   Socioeconomic History   Marital status: Married    Spouse name: Not on file   Number of children: 2   Years of education: Not on file   Highest education level: Not on file  Occupational History   Occupation: TECHNICAL SUP    Employer: [employer]    Comment: retired  Tobacco Use   Smoking status: Never   Smokeless tobacco: Never  Vaping Use   Vaping status: Never Used  Substance and Sexual Activity   Alcohol use: No   Drug use: No   Sexual activity: Yes    Partners: Female    Birth control/protection: None  Other Topics Concern   Not on file  Social History Narrative   Non smoker  No ets          Social Determinants of Health   Financial Resource Strain: Low Risk  (12/31/2022)   Overall Financial Resource Strain (CARDIA)    Difficulty of Paying Living Expenses: Not hard at all  Food Insecurity: No Food Insecurity (12/31/2022)   Hunger Vital Sign    Worried About Running Out of Food in the Last Year: Never true    Ran Out of Food in the Last Year: Never true  Transportation Needs: No Transportation Needs (12/31/2022)   PRAPARE - Administrator, Civil Service (Medical): No    Lack of Transportation (Non-Medical): No  Physical Activity: Sufficiently Active (12/31/2022)   Exercise Vital Sign    Days of Exercise per Week: 7 days    Minutes of Exercise per Session: 30 min  Stress: No Stress Concern Present (12/31/2022)   [employer] of Occupational Health - Occupational Stress Questionnaire    Feeling of Stress : Not at all  Social Connections: Socially Integrated (12/31/2022)   Social Connection and Isolation Panel [NHANES]    Frequency  of Communication with Friends and Family: More than three times a week    Frequency of Social Gatherings with Friends and Family: More than three times a week    Attends Religious Services: More than 4 times per year    Active Member of [employer] or Organizations: Yes    Attends Engineer, structural: More than 4 times per year    Marital Status: Married  Catering manager Violence: Not At Risk (12/31/2022)   Humiliation, Afraid, Rape, and Kick questionnaire    Fear of Current or Ex-Partner: No    Emotionally Abused: No    Physically Abused: No    Sexually Abused: No    Review of Systems:  All other review of systems negative except as mentioned in the HPI.  Physical Exam: Vital signs BP (!) 143/72   Pulse 66   Temp 98 F (36.7 C) (Temporal)   Ht 6\' 1"  (1.854 m)   Wt 202 lb (91.6 kg)   SpO2 96%   BMI 26.65 kg/m   General:   Alert,  Well-developed, well-nourished, pleasant and cooperative in NAD Airway:  Mallampati 2 Lungs:  Clear throughout to auscultation.   Heart:  Regular rate and rhythm; no murmurs, clicks, rubs,  or gallops. Abdomen:  Soft, nontender and nondistended. Normal bowel sounds.   Neuro/Psych:  Normal mood and affect. A and O x 3   Triniti Gruetzmacher E. Tomasa Rand, MD [facility]

## 2023-05-03 NOTE — Progress Notes (Signed)
Called to room to assist during endoscopic procedure.  Patient ID and intended procedure confirmed with present staff. Received instructions for my participation in the procedure from the performing physician.  

## 2023-05-03 NOTE — Op Note (Signed)
[facility] Patient Name: Francis Campbell Procedure Date: 05/03/2023 8:21 AM MRN: [id number] Endoscopist: Lorin Picket E. Tomasa Rand , MD, [id number] Age: 77 Referring MD:  Date of Birth: 24-Sep-1945 Gender: Male Account #: [id number] Procedure:                Colonoscopy Indications:              Screening for colorectal malignant neoplasm (last                            colonoscopy was more than 10 years ago) Medicines:                Monitored Anesthesia Care Procedure:                Pre-Anesthesia Assessment:                           - Prior to the procedure, a History and Physical                            was performed, and patient medications and                            allergies were reviewed. The patient's tolerance of                            previous anesthesia was also reviewed. The risks                            and benefits of the procedure and the sedation                            options and risks were discussed with the patient.                            All questions were answered, and informed consent                            was obtained. Prior Anticoagulants: The patient has                            taken Coumadin (warfarin), last dose was 5 days                            prior to procedure. ASA Grade Assessment: III - A                            patient with severe systemic disease. After                            reviewing the risks and benefits, the patient was                            deemed in satisfactory condition to undergo the  procedure.                           After obtaining informed consent, the colonoscope                            was passed under direct vision. Throughout the                            procedure, the patient's blood pressure, pulse, and                            oxygen saturations were monitored continuously. The                            Olympus Scope SN: [id number] was introduced  through                            the anus and advanced to the the cecum, identified                            by appendiceal orifice and ileocecal valve. The                            colonoscopy was performed without difficulty. The                            patient tolerated the procedure well. The quality                            of the bowel preparation was adequate. The                            ileocecal valve, appendiceal orifice, and rectum                            were photographed. The bowel preparation used was                            SUPREP via split dose instruction. Scope In: 8:37:38 AM Scope Out: 9:09:33 AM Scope Withdrawal Time: 0 hours 26 minutes 36 seconds  Total Procedure Duration: 0 hours 31 minutes 55 seconds  Findings:                 Skin tags were found on perianal exam.                           The digital rectal exam was normal. Pertinent                            negatives include normal sphincter tone and no                            palpable rectal lesions.  A 4 mm polyp was found in the hepatic flexure. The                            polyp was sessile. The polyp was removed with a                            cold snare. Resection and retrieval were complete.                            Estimated blood loss was minimal.                           A 10 mm polyp was found in the transverse colon.                            The polyp was flat. The polyp was removed with a                            cold snare. Resection and retrieval were complete.                            Estimated blood loss was minimal.                           A 3 mm polyp was found in the descending colon. The                            polyp was sessile. The polyp was removed with a                            cold snare. Resection and retrieval were complete.                            Estimated blood loss was minimal.                           Two  sessile polyps were found in the sigmoid colon.                            The polyps were 3 to 7 mm in size. These polyps                            were removed with a cold snare. Resection and                            retrieval were complete. Estimated blood loss was                            minimal.                           A localized area of erythematous and friable (with  contact bleeding) mucosa was found at the ileocecal                            valve. Biopsies were taken with a cold forceps for                            histology. Estimated blood loss was minimal.                           Many medium-mouthed and small-mouthed diverticula                            were found in the sigmoid colon and descending                            colon. There was no evidence of diverticular                            bleeding.                           The exam was otherwise normal throughout the                            examined colon.                           The retroflexed view of the distal rectum and anal                            verge was normal and showed no anal or rectal                            abnormalities. Complications:            No immediate complications. Estimated Blood Loss:     Estimated blood loss was minimal. Impression:               - Perianal skin tags found on perianal exam.                           - One 4 mm polyp at the hepatic flexure, removed                            with a cold snare. Resected and retrieved.                           - One 10 mm polyp in the transverse colon, removed                            with a cold snare. Resected and retrieved.                           - One 3 mm polyp in the descending colon, removed  with a cold snare. Resected and retrieved.                           - Two 3 to 7 mm polyps in the sigmoid colon,                            removed with a cold  snare. Resected and retrieved.                           - Erythematous and friable (with contact bleeding)                            mucosa at the ileocecal valve. Biopsied.                           - Moderate diverticulosis in the sigmoid colon and                            in the descending colon. There was no evidence of                            diverticular bleeding.                           - The distal rectum and anal verge are normal on                            retroflexion view. Recommendation:           - Patient has a contact number available for                            emergencies. The signs and symptoms of potential                            delayed complications were discussed with the                            patient. Return to normal activities tomorrow.                            Written discharge instructions were provided to the                            patient.                           - Resume previous diet.                           - Continue present medications.                           - Await pathology results.                           -  Repeat colonoscopy (date not yet determined) for                            surveillance based on pathology results.                           - Resume Coumadin (warfarin) at previously                            recommended dose (2 tabs) tomorrow. Ahmir Bracken E. Tomasa Rand, MD 05/03/2023 9:21:13 AM This report has been signed electronically.

## 2023-05-03 NOTE — Progress Notes (Signed)
Pt's states no medical or surgical changes since previsit or office visit. VS assessed by C.M

## 2023-05-06 ENCOUNTER — Telehealth: Payer: Self-pay | Admitting: *Deleted

## 2023-05-06 NOTE — Telephone Encounter (Signed)
  Follow up Call-     05/03/2023    7:32 AM  Call back number  Post procedure Call Back phone  # [phone]  Permission to leave phone message Yes     Patient questions:  Do you have a fever, pain , or abdominal swelling? No. Pain Score  0 *  Have you tolerated food without any problems? Yes.    Have you been able to return to your normal activities? Yes.    Do you have any questions about your discharge instructions: Diet   No. Medications  No. Follow up visit  No.  Do you have questions or concerns about your Care? No.  Actions: * If pain score is 4 or above: No action needed, pain <4.

## 2023-05-07 DIAGNOSIS — H26492 Other secondary cataract, left eye: Secondary | ICD-10-CM | POA: Diagnosis not present

## 2023-05-07 DIAGNOSIS — Z961 Presence of intraocular lens: Secondary | ICD-10-CM | POA: Diagnosis not present

## 2023-05-07 DIAGNOSIS — H18413 Arcus senilis, bilateral: Secondary | ICD-10-CM | POA: Diagnosis not present

## 2023-05-07 DIAGNOSIS — H02831 Dermatochalasis of right upper eyelid: Secondary | ICD-10-CM | POA: Diagnosis not present

## 2023-05-09 ENCOUNTER — Telehealth: Payer: Self-pay | Admitting: Gastroenterology

## 2023-05-09 NOTE — Telephone Encounter (Signed)
Spoke with pt and let him know the path report is not back yet and he will be notified with the results when they are back.

## 2023-05-09 NOTE — Telephone Encounter (Signed)
Inbound call from patient requesting a call to discuss results from colonoscopy on 8/16. Please advise.

## 2023-05-10 ENCOUNTER — Ambulatory Visit: Payer: PPO

## 2023-05-10 DIAGNOSIS — Z5181 Encounter for therapeutic drug level monitoring: Secondary | ICD-10-CM | POA: Diagnosis not present

## 2023-05-10 DIAGNOSIS — I480 Paroxysmal atrial fibrillation: Secondary | ICD-10-CM

## 2023-05-10 LAB — POCT INR: INR: 2.8 (ref 2.0–3.0)

## 2023-05-10 NOTE — Patient Instructions (Signed)
Description   Continue taking warfarin 5mg  daily except 7.5mg  every Fridays.  Recheck INR in 3 weeks.   [facility] [phone]

## 2023-05-14 ENCOUNTER — Other Ambulatory Visit (HOSPITAL_COMMUNITY): Payer: Self-pay

## 2023-05-14 NOTE — Progress Notes (Signed)
Francis Campbell,   Only two of the polyps that I removed during your recent procedure were proven to be "pre-cancerous" polyps that MAY have grown into cancers if they had not been removed.  The other polyps were not precancerous. Studies shows that at least 20% of women over age 76 and 30% of men over age 64 have pre-cancerous polyps.  Based on current nationally recognized surveillance guidelines, it would be recommended that you have a repeat colonoscopy in 3 years.   However, because colon cancer screening after age 7 is done on a case-by-case basis, taking into account the patient's risk factors for colon cancer, as well as comorbidities and life expectancy, I would recommend against further colon cancer screening.  The biopsies of the abnormal mucosa at the ileocecal valve showed reactive/inflammatory changes without any cancerous or precancerous findings.  These abnormalities could possibly be related to a history of Crohn's disease.  Based on your age and absence of symptoms, I would not recommend any further evaluation or treatment for Crohn's disease at this time.  If you would like to consider further colon cancer screening, please come see me in the office in 3 years to further discuss the risks and benefits of another colonoscopy.

## 2023-05-15 DIAGNOSIS — H2512 Age-related nuclear cataract, left eye: Secondary | ICD-10-CM | POA: Diagnosis not present

## 2023-05-15 DIAGNOSIS — H26491 Other secondary cataract, right eye: Secondary | ICD-10-CM | POA: Diagnosis not present

## 2023-05-20 ENCOUNTER — Other Ambulatory Visit (HOSPITAL_BASED_OUTPATIENT_CLINIC_OR_DEPARTMENT_OTHER): Payer: Self-pay | Admitting: Cardiovascular Disease

## 2023-05-21 ENCOUNTER — Other Ambulatory Visit (HOSPITAL_COMMUNITY): Payer: Self-pay

## 2023-05-21 ENCOUNTER — Ambulatory Visit (HOSPITAL_BASED_OUTPATIENT_CLINIC_OR_DEPARTMENT_OTHER): Payer: PPO | Admitting: Cardiovascular Disease

## 2023-05-21 ENCOUNTER — Encounter (HOSPITAL_BASED_OUTPATIENT_CLINIC_OR_DEPARTMENT_OTHER): Payer: Self-pay | Admitting: Cardiovascular Disease

## 2023-05-21 VITALS — BP 135/78 | HR 63 | Ht 73.0 in | Wt 204.0 lb

## 2023-05-21 DIAGNOSIS — I7121 Aneurysm of the ascending aorta, without rupture: Secondary | ICD-10-CM | POA: Diagnosis not present

## 2023-05-21 DIAGNOSIS — E7800 Pure hypercholesterolemia, unspecified: Secondary | ICD-10-CM | POA: Diagnosis not present

## 2023-05-21 DIAGNOSIS — H1132 Conjunctival hemorrhage, left eye: Secondary | ICD-10-CM | POA: Diagnosis not present

## 2023-05-21 DIAGNOSIS — I480 Paroxysmal atrial fibrillation: Secondary | ICD-10-CM | POA: Diagnosis not present

## 2023-05-21 DIAGNOSIS — I4892 Unspecified atrial flutter: Secondary | ICD-10-CM | POA: Diagnosis not present

## 2023-05-21 DIAGNOSIS — I351 Nonrheumatic aortic (valve) insufficiency: Secondary | ICD-10-CM

## 2023-05-21 DIAGNOSIS — I10 Essential (primary) hypertension: Secondary | ICD-10-CM | POA: Diagnosis not present

## 2023-05-21 NOTE — Progress Notes (Signed)
Cardiology Office Note:  .    Date:  05/21/2023  ID:  Francis Campbell., DOB [birth year], MRN [id number] PCP: Kristian Covey, MD  [facility] Providers Cardiologist:  Chilton Si, MD     History of Present Illness: Francis Campbell    Francis Campbell. is a 77 y.o. male with paroxysmal atrial fibrillation on coumadin, hypertension, hyperlipidemia, mild ascending aortic aneurysm, and Crohn's disease, who presents for follow up.  Francis Campbell was previously a patient of Dr. Patty Sermons.   Francis Campbell was evaluated by Dr. Johney Frame for catheter ablation in 2014 and elected to use flecainide as a "pill in the pocket."  He underwent DCCV 04/2013.  He had a negative Myoview 09/2011 and an echo 10/2011 that revealed LVEF 60% with mild AR.  He noted increasing episodes of atrial fibrillation and started on daily flecainide 04/2016.  He developed bradycardia, so metoprolol was reduced.   [name] was seen in the ED 08/2017 with intermittent chest tightness.  His symptoms were felt to be atypical and EKG was unremarkable.  Troponin and chest x-ray were within normal limits.  He followed up in clinic and was referred for an exercise Myoview 09/2017 that was negative for ischemia.  Repeat echo 05/2022 revealed LVEF 65-70% with grade 1 diastolic dysfunction.  Ascending aorta was 4.3 cm, increased from 4.0 cm in 2019.  He saw Francis Shields, NP 11/2022 and reported a brief episode of atrial fibrillation that responded to flecainide.  Today, he is accompanied by his wife. From a cardiovascular perspective he is feeling well. His main complaint today is an acute headache with redness in his left eye. Of note, a week ago he underwent eye surgery. Every so often, he monitors his blood pressure at home usually with readings in the 140s/70s. In the office his blood pressure is 135/78. He denies any significant arrhythmias: the only recent episode in the interim was around the time of his visit with Francis Shields, NP which had  responded to flecainide. He has an upcoming echocardiogram scheduled for 06/03/2023. Lately he has not been formally exercising consistently. He will plan to keep working on increasing his exercise. When he does walk, he denies any anginal symptoms, no shortness of breath. Regarding his diet he has been working on reducing his sodium intake. He denies any peripheral edema, lightheadedness, syncope, orthopnea, or PND.  ROS:  Please see the history of present illness. All other systems are reviewed and negative.  (+) Headache (+) Redness of left eye  Studies Reviewed: .        Risk Assessment/Calculations:    CHA2DS2-VASc Score = 3   This indicates a 3.2% annual risk of stroke. The patient's score is based upon: CHF History: 0 HTN History: 1 Diabetes History: 0 Stroke History: 0 Vascular Disease History: 0 Age Score: 2 Gender Score: 0            Physical Exam:    VS:  BP 135/78   Pulse 63   Ht 6\' 1"  (1.854 m)   Wt 204 lb (92.5 kg)   BMI 26.91 kg/m  , BMI Body mass index is 26.91 kg/m. GENERAL:  Well appearing HEENT: Pupils equal round and reactive, fundi not visualized, oral mucosa unremarkable NECK:  No jugular venous distention, waveform within normal limits, carotid upstroke brisk and symmetric, no bruits, no thyromegaly LUNGS:  Clear to auscultation bilaterally HEART:  RRR.  PMI not displaced or sustained,S1 and S2 within normal limits, no S3,  no S4, no clicks, no rubs, 2/4 diastolic murmur. ABD:  Flat, positive bowel sounds normal in frequency in pitch, no bruits, no rebound, no guarding, no midline pulsatile mass, no hepatomegaly, no splenomegaly EXT:  2 plus pulses throughout, no edema, no cyanosis no clubbing SKIN:  No rashes no nodules NEURO:  Cranial nerves II through XII grossly intact, motor grossly intact throughout PSYCH:  Cognitively intact, oriented to person place and time  Wt Readings from Last 3 Encounters:  05/21/23 204 lb (92.5 kg)  05/03/23 202 lb  (91.6 kg)  03/11/23 202 lb (91.6 kg)     ASSESSMENT AND PLAN: .    # Paroxysmal atrial Fibrillation Well-controlled with Flecainide. No recent episodes of AFib. Stable on Warfarin with therapeutic INR. The patient is comfortable with current medication and does not wish to switch to a different anticoagulant. -Continue Flecainide as prescribed.  # Hypertension Blood pressure slightly above target (135/78). The patient is encouraged to increase physical activity and maintain a low-salt diet to improve blood pressure control. -Increase physical activity to at least 5-7 days per week. -Maintain a low-salt diet. -Check lipid panel and comprehensive metabolic panel at the time of the upcoming echocardiogram.  # Ascending aortic aneurysm: # Aortic regurgitation: Stable aortic murmur noted on examination.  Mild AR on echo.  Ascending aorta 4.3 cm increased from 4.0cm in 2019.   The patient is scheduled for an echocardiogram in the near future. -Perform echocardiogram as scheduled. General Health Maintenance / Followup Plans -Encourage the patient to maintain a regular exercise routine and a low-salt diet. -Check lipid panel and comprehensive metabolic panel at the time of the upcoming echocardiogram. -Plan for annual follow-up, or sooner if any issues arise.   # Hyperlipidemia:  Continue rosuvastatin.     Dispo:  FU with Francis Mehra C. Duke Salvia, MD, [facility] in 1 year.  I,[name],acting as a Neurosurgeon for Chilton Si, MD.,have documented all relevant documentation on the behalf of Chilton Si, MD,as directed by  Chilton Si, MD while in the presence of Chilton Si, MD.  I, [name] C. Duke Salvia, MD have reviewed all documentation for this visit.  The documentation of the exam, diagnosis, procedures, and orders on 05/21/2023 are all accurate and complete.   Signed, Chilton Si, MD

## 2023-05-21 NOTE — Telephone Encounter (Signed)
Rx request sent to pharmacy.  

## 2023-05-21 NOTE — Patient Instructions (Signed)
Medication Instructions:  Your physician recommends that you continue on your current medications as directed. Please refer to the Current Medication list given to you today.  *If you need a refill on your cardiac medications before your next appointment, please call your pharmacy*   Lab Work: Your physician recommends that you return for lab work in the next week or two when fasting for Lipid & CMP   Follow-Up: At [facility], you and your health needs are our priority.  As part of our continuing mission to provide you with exceptional heart care, we have created designated Provider Care Teams.  These Care Teams include your primary Cardiologist (physician) and Advanced Practice Providers (APPs -  Physician Assistants and Nurse Practitioners) who all work together to provide you with the care you need, when you need it.  We recommend signing up for the patient portal called "MyChart".  Sign up information is provided on this After Visit Summary.  MyChart is used to connect with patients for Virtual Visits (Telemedicine).  Patients are able to view lab/test results, encounter notes, upcoming appointments, etc.  Non-urgent messages can be sent to your provider as well.   To learn more about what you can do with MyChart, go to [email or web address].    Your next appointment:   1 year with Dr. Duke Salvia

## 2023-05-21 NOTE — Progress Notes (Deleted)
  Cardiology Office Note:  .   Date:  05/21/2023  ID:  Francis Campbell., DOB [birth year], MRN [id number] PCP: Kristian Covey, MD  [facility] Providers Cardiologist:  Chilton Si, MD { Click to update primary MD,subspecialty MD or APP then REFRESH:1}   History of Present Illness: Francis Campbell   Francis Campbell. is a 77 y.o. male with paroxysmal atrial fibrillation on coumadin, hypertension, hyperlipidemia, mild ascending aortic aneurysm, and Crohn's disease who presents for follow up.  Francis Campbell was previously a patient of Dr. Patty Sermons.   Francis Campbell was evaluated by Dr. Johney Frame for catheter ablation in 2014 and elected to use flecainide as a "pill in the pocket."  He underwent DCCV 04/2013.  He had a negative Myoview 09/2011 and an echo 10/2011 that revealed LVEF 60% with mild AR.  He noted increasing episodes of atrial fibrillation and started on daily flecainide 04/2016.  He developed bradycardia, so metoprolol was reduced.    Francis Campbell was seen in the ED 08/2017 with intermittent chest tightness.  His symptoms were felt to be atypical and EKG was unremarkable.  Troponin and chest x-ray were within normal limits.  He followed up in clinic and was referred for an exercise Myoview 09/2017 that was negative for ischemia.  Repeat echo 05/2022 revealed LVEF 65-70% with grade 1 diastolic dysfunction.  Ascending aorta was 4.3 cm, increased from 4.0 cm in 2019.  He saw [name], NP 11/2022 and reported a brief episode of atrial fibrillation that responded to flecainide.    ROS: ***  Studies Reviewed: .        *** Risk Assessment/Calculations:   {Does this patient have ATRIAL FIBRILLATION?:[phone]} No BP recorded.  {Refresh Note OR Click here to enter BP  :1}***       Physical Exam:   VS:  There were no vitals taken for this visit.   Wt Readings from Last 3 Encounters:  05/03/23 202 lb (91.6 kg)  03/11/23 202 lb (91.6 kg)  12/31/22 198 lb (89.8 kg)    GEN: Well nourished, well  developed in no acute distress NECK: No JVD; No carotid bruits CARDIAC: ***RRR, no murmurs, rubs, gallops RESPIRATORY:  Clear to auscultation without rales, wheezing or rhonchi  ABDOMEN: Soft, non-tender, non-distended EXTREMITIES:  No edema; No deformity   ASSESSMENT AND PLAN: .   ***    {Are you ordering a CV Procedure (e.g. stress test, cath, DCCV, TEE, etc)?   Press F2        :[id number]}  Dispo: ***  Signed, Chilton Si, MD

## 2023-05-24 DIAGNOSIS — E7800 Pure hypercholesterolemia, unspecified: Secondary | ICD-10-CM | POA: Diagnosis not present

## 2023-05-24 LAB — COMPREHENSIVE METABOLIC PANEL
ALT: 19 IU/L (ref 0–44)
AST: 20 IU/L (ref 0–40)
Albumin: 4.4 g/dL (ref 3.8–4.8)
Alkaline Phosphatase: 95 IU/L (ref 44–121)
BUN/Creatinine Ratio: 25 — ABNORMAL HIGH (ref 10–24)
BUN: 25 mg/dL (ref 8–27)
Bilirubin Total: 0.6 mg/dL (ref 0.0–1.2)
CO2: 26 mmol/L (ref 20–29)
Calcium: 9.7 mg/dL (ref 8.6–10.2)
Chloride: 105 mmol/L (ref 96–106)
Creatinine, Ser: 0.99 mg/dL (ref 0.76–1.27)
Globulin, Total: 1.8 g/dL (ref 1.5–4.5)
Glucose: 97 mg/dL (ref 70–99)
Potassium: 4.5 mmol/L (ref 3.5–5.2)
Sodium: 145 mmol/L — ABNORMAL HIGH (ref 134–144)
Total Protein: 6.2 g/dL (ref 6.0–8.5)
eGFR: 79 mL/min/{1.73_m2} (ref >59)

## 2023-05-24 LAB — LIPID PANEL
Chol/HDL Ratio: 4 ratio (ref 0.0–5.0)
Cholesterol, Total: 156 mg/dL (ref 100–199)
HDL: 39 mg/dL — ABNORMAL LOW (ref >39)
LDL Chol Calc (NIH): 81 mg/dL (ref 0–99)
Triglycerides: 213 mg/dL — ABNORMAL HIGH (ref 0–149)
VLDL Cholesterol Cal: 36 mg/dL (ref 5–40)

## 2023-05-29 ENCOUNTER — Encounter (HOSPITAL_BASED_OUTPATIENT_CLINIC_OR_DEPARTMENT_OTHER): Payer: Self-pay | Admitting: Cardiovascular Disease

## 2023-05-30 ENCOUNTER — Encounter (HOSPITAL_BASED_OUTPATIENT_CLINIC_OR_DEPARTMENT_OTHER): Payer: Self-pay | Admitting: Cardiovascular Disease

## 2023-05-30 DIAGNOSIS — H26491 Other secondary cataract, right eye: Secondary | ICD-10-CM | POA: Diagnosis not present

## 2023-05-30 DIAGNOSIS — E7800 Pure hypercholesterolemia, unspecified: Secondary | ICD-10-CM

## 2023-05-30 MED ORDER — EZETIMIBE 10 MG PO TABS
10.0000 mg | ORAL_TABLET | Freq: Every day | ORAL | 3 refills | Status: DC
Start: 2023-05-30 — End: 2023-05-30

## 2023-05-30 MED ORDER — EZETIMIBE 10 MG PO TABS
10.0000 mg | ORAL_TABLET | Freq: Every day | ORAL | 3 refills | Status: DC
Start: 2023-05-30 — End: 2023-06-03

## 2023-05-30 NOTE — Addendum Note (Signed)
Addended by: Marlene Lard on: 05/30/2023 11:34 AM   Modules accepted: Orders

## 2023-05-31 ENCOUNTER — Ambulatory Visit: Payer: PPO | Attending: Cardiology

## 2023-05-31 DIAGNOSIS — I480 Paroxysmal atrial fibrillation: Secondary | ICD-10-CM | POA: Diagnosis not present

## 2023-05-31 DIAGNOSIS — Z5181 Encounter for therapeutic drug level monitoring: Secondary | ICD-10-CM

## 2023-05-31 LAB — POCT INR: INR: 2.3 (ref 2.0–3.0)

## 2023-05-31 NOTE — Patient Instructions (Signed)
Continue taking warfarin 5mg  daily except 7.5mg  every Fridays.  Recheck INR in 6 weeks.   [facility] [phone]

## 2023-06-03 ENCOUNTER — Other Ambulatory Visit (HOSPITAL_COMMUNITY): Payer: Self-pay

## 2023-06-03 ENCOUNTER — Other Ambulatory Visit (HOSPITAL_BASED_OUTPATIENT_CLINIC_OR_DEPARTMENT_OTHER): Payer: Self-pay | Admitting: Family

## 2023-06-03 ENCOUNTER — Other Ambulatory Visit: Payer: Self-pay

## 2023-06-03 ENCOUNTER — Other Ambulatory Visit: Payer: Self-pay | Admitting: Family Medicine

## 2023-06-03 ENCOUNTER — Ambulatory Visit (INDEPENDENT_AMBULATORY_CARE_PROVIDER_SITE_OTHER): Payer: PPO

## 2023-06-03 DIAGNOSIS — I480 Paroxysmal atrial fibrillation: Secondary | ICD-10-CM | POA: Diagnosis not present

## 2023-06-03 DIAGNOSIS — D6859 Other primary thrombophilia: Secondary | ICD-10-CM

## 2023-06-03 DIAGNOSIS — I7121 Aneurysm of the ascending aorta, without rupture: Secondary | ICD-10-CM

## 2023-06-03 DIAGNOSIS — I10 Essential (primary) hypertension: Secondary | ICD-10-CM | POA: Diagnosis not present

## 2023-06-03 LAB — ECHOCARDIOGRAM COMPLETE
Area-P 1/2: 3.6 cm2
P 1/2 time: 944 ms
S' Lateral: 2.24 cm

## 2023-06-03 MED ORDER — ROSUVASTATIN CALCIUM 40 MG PO TABS
40.0000 mg | ORAL_TABLET | Freq: Every day | ORAL | 3 refills | Status: DC
  Filled 2023-06-03: qty 90, 90d supply, fill #0
  Filled 2023-09-02: qty 90, 90d supply, fill #1
  Filled 2023-12-02: qty 90, 90d supply, fill #2
  Filled 2024-03-02: qty 90, 90d supply, fill #3

## 2023-06-03 MED ORDER — SERTRALINE HCL 50 MG PO TABS
50.0000 mg | ORAL_TABLET | Freq: Every day | ORAL | 0 refills | Status: DC
  Filled 2023-06-03: qty 30, 30d supply, fill #0

## 2023-06-03 MED ORDER — EZETIMIBE 10 MG PO TABS
10.0000 mg | ORAL_TABLET | Freq: Every day | ORAL | 3 refills | Status: DC
  Filled 2023-06-03 – 2023-06-05 (×2): qty 90, 90d supply, fill #0
  Filled 2023-09-02: qty 90, 90d supply, fill #1
  Filled 2023-12-02: qty 90, 90d supply, fill #2
  Filled 2024-03-02: qty 90, 90d supply, fill #3

## 2023-06-03 NOTE — Addendum Note (Signed)
Addended by: Regis Bill B on: 06/03/2023 11:47 AM   Modules accepted: Orders

## 2023-06-04 ENCOUNTER — Other Ambulatory Visit (HOSPITAL_COMMUNITY): Payer: Self-pay

## 2023-06-05 ENCOUNTER — Other Ambulatory Visit (HOSPITAL_COMMUNITY): Payer: Self-pay

## 2023-06-05 ENCOUNTER — Other Ambulatory Visit: Payer: Self-pay

## 2023-06-06 ENCOUNTER — Other Ambulatory Visit (HOSPITAL_BASED_OUTPATIENT_CLINIC_OR_DEPARTMENT_OTHER): Payer: Self-pay | Admitting: *Deleted

## 2023-06-06 DIAGNOSIS — I351 Nonrheumatic aortic (valve) insufficiency: Secondary | ICD-10-CM

## 2023-06-06 DIAGNOSIS — I10 Essential (primary) hypertension: Secondary | ICD-10-CM

## 2023-06-06 DIAGNOSIS — I480 Paroxysmal atrial fibrillation: Secondary | ICD-10-CM

## 2023-06-06 DIAGNOSIS — I7121 Aneurysm of the ascending aorta, without rupture: Secondary | ICD-10-CM

## 2023-06-07 DIAGNOSIS — H2511 Age-related nuclear cataract, right eye: Secondary | ICD-10-CM | POA: Diagnosis not present

## 2023-06-12 DIAGNOSIS — N5201 Erectile dysfunction due to arterial insufficiency: Secondary | ICD-10-CM | POA: Diagnosis not present

## 2023-06-12 DIAGNOSIS — N200 Calculus of kidney: Secondary | ICD-10-CM | POA: Diagnosis not present

## 2023-06-17 ENCOUNTER — Other Ambulatory Visit: Payer: Self-pay

## 2023-06-17 ENCOUNTER — Other Ambulatory Visit (HOSPITAL_COMMUNITY): Payer: Self-pay

## 2023-06-24 ENCOUNTER — Other Ambulatory Visit (HOSPITAL_COMMUNITY): Payer: Self-pay

## 2023-06-24 ENCOUNTER — Other Ambulatory Visit: Payer: Self-pay

## 2023-06-24 ENCOUNTER — Other Ambulatory Visit: Payer: Self-pay | Admitting: Cardiovascular Disease

## 2023-06-24 DIAGNOSIS — I480 Paroxysmal atrial fibrillation: Secondary | ICD-10-CM

## 2023-06-24 MED ORDER — DILTIAZEM HCL ER COATED BEADS 240 MG PO CP24
ORAL_CAPSULE | ORAL | 3 refills | Status: DC
  Filled 2023-06-24: qty 90, 90d supply, fill #0
  Filled 2023-09-16: qty 90, 90d supply, fill #1
  Filled 2023-12-23: qty 90, 90d supply, fill #2
  Filled 2024-03-16: qty 90, 90d supply, fill #3

## 2023-06-24 MED ORDER — WARFARIN SODIUM 5 MG PO TABS
ORAL_TABLET | ORAL | 0 refills | Status: DC
  Filled 2023-06-24: qty 100, 90d supply, fill #0

## 2023-06-24 NOTE — Telephone Encounter (Signed)
Dilt prescription pended, please authorize refill for warfarin

## 2023-06-24 NOTE — Telephone Encounter (Signed)
Prescription refill request received for warfarin Lov: 05/21/23  Next INR check: 07/12/23  Warfarin tablet strength: 5mg   Appropriate dose. Refill sent.

## 2023-07-08 ENCOUNTER — Other Ambulatory Visit: Payer: Self-pay

## 2023-07-08 ENCOUNTER — Other Ambulatory Visit (HOSPITAL_COMMUNITY): Payer: Self-pay

## 2023-07-08 ENCOUNTER — Other Ambulatory Visit: Payer: Self-pay | Admitting: Family Medicine

## 2023-07-08 MED ORDER — SERTRALINE HCL 50 MG PO TABS
50.0000 mg | ORAL_TABLET | Freq: Every day | ORAL | 0 refills | Status: DC
  Filled 2023-07-08: qty 30, 30d supply, fill #0

## 2023-07-12 ENCOUNTER — Ambulatory Visit: Payer: PPO | Attending: Cardiovascular Disease | Admitting: *Deleted

## 2023-07-12 DIAGNOSIS — Z5181 Encounter for therapeutic drug level monitoring: Secondary | ICD-10-CM | POA: Diagnosis not present

## 2023-07-12 DIAGNOSIS — I480 Paroxysmal atrial fibrillation: Secondary | ICD-10-CM | POA: Diagnosis not present

## 2023-07-12 LAB — POCT INR: POC INR: 2

## 2023-07-12 NOTE — Patient Instructions (Signed)
Description   Take 1.5 tablets of warfarin tomorrow then continue taking warfarin 5mg  daily except 7.5mg  every Fridays.  Recheck INR in 6 weeks.   [facility] [phone]

## 2023-07-15 ENCOUNTER — Other Ambulatory Visit (HOSPITAL_COMMUNITY): Payer: Self-pay

## 2023-07-18 ENCOUNTER — Encounter: Payer: Self-pay | Admitting: Family Medicine

## 2023-08-05 ENCOUNTER — Other Ambulatory Visit: Payer: Self-pay | Admitting: Family Medicine

## 2023-08-05 ENCOUNTER — Other Ambulatory Visit: Payer: Self-pay

## 2023-08-05 ENCOUNTER — Other Ambulatory Visit (HOSPITAL_COMMUNITY): Payer: Self-pay

## 2023-08-05 MED ORDER — SERTRALINE HCL 50 MG PO TABS
50.0000 mg | ORAL_TABLET | Freq: Every day | ORAL | 0 refills | Status: DC
  Filled 2023-08-05: qty 30, 30d supply, fill #0

## 2023-08-14 ENCOUNTER — Encounter (HOSPITAL_BASED_OUTPATIENT_CLINIC_OR_DEPARTMENT_OTHER): Payer: Self-pay | Admitting: Cardiovascular Disease

## 2023-08-19 ENCOUNTER — Other Ambulatory Visit (HOSPITAL_BASED_OUTPATIENT_CLINIC_OR_DEPARTMENT_OTHER): Payer: Self-pay | Admitting: Cardiovascular Disease

## 2023-08-19 ENCOUNTER — Other Ambulatory Visit (HOSPITAL_COMMUNITY): Payer: Self-pay

## 2023-08-20 MED ORDER — METOPROLOL SUCCINATE ER 25 MG PO TB24
25.0000 mg | ORAL_TABLET | Freq: Every day | ORAL | 3 refills | Status: DC
  Filled 2023-08-20: qty 90, 90d supply, fill #0
  Filled 2023-11-11: qty 90, 90d supply, fill #1
  Filled 2024-02-17: qty 90, 90d supply, fill #2
  Filled 2024-05-12: qty 90, 90d supply, fill #3

## 2023-08-21 ENCOUNTER — Other Ambulatory Visit: Payer: Self-pay

## 2023-08-23 ENCOUNTER — Ambulatory Visit: Payer: PPO | Attending: Cardiology

## 2023-08-23 DIAGNOSIS — Z5181 Encounter for therapeutic drug level monitoring: Secondary | ICD-10-CM | POA: Diagnosis not present

## 2023-08-23 DIAGNOSIS — I480 Paroxysmal atrial fibrillation: Secondary | ICD-10-CM | POA: Diagnosis not present

## 2023-08-23 LAB — POCT INR: INR: 2.7 (ref 2.0–3.0)

## 2023-08-23 NOTE — Patient Instructions (Signed)
continue taking warfarin 5mg  daily except 7.5mg  every Fridays.  Recheck INR in 6 weeks.   [facility] [phone]

## 2023-08-29 DIAGNOSIS — M25511 Pain in right shoulder: Secondary | ICD-10-CM | POA: Diagnosis not present

## 2023-09-02 ENCOUNTER — Other Ambulatory Visit (HOSPITAL_COMMUNITY): Payer: Self-pay

## 2023-09-02 ENCOUNTER — Other Ambulatory Visit: Payer: Self-pay | Admitting: Family Medicine

## 2023-09-03 ENCOUNTER — Other Ambulatory Visit: Payer: Self-pay

## 2023-09-03 MED ORDER — SERTRALINE HCL 50 MG PO TABS
50.0000 mg | ORAL_TABLET | Freq: Every day | ORAL | 0 refills | Status: DC
  Filled 2023-09-03: qty 30, 30d supply, fill #0

## 2023-09-06 ENCOUNTER — Encounter (HOSPITAL_BASED_OUTPATIENT_CLINIC_OR_DEPARTMENT_OTHER): Payer: Self-pay | Admitting: Cardiovascular Disease

## 2023-09-16 ENCOUNTER — Other Ambulatory Visit: Payer: Self-pay | Admitting: Family Medicine

## 2023-09-16 ENCOUNTER — Other Ambulatory Visit (HOSPITAL_COMMUNITY): Payer: Self-pay

## 2023-09-17 DIAGNOSIS — M25511 Pain in right shoulder: Secondary | ICD-10-CM | POA: Diagnosis not present

## 2023-09-17 DIAGNOSIS — M25611 Stiffness of right shoulder, not elsewhere classified: Secondary | ICD-10-CM | POA: Diagnosis not present

## 2023-09-17 MED ORDER — ALPRAZOLAM 0.25 MG PO TABS
0.2500 mg | ORAL_TABLET | Freq: Every evening | ORAL | 1 refills | Status: DC | PRN
  Filled 2023-09-17: qty 30, 30d supply, fill #0
  Filled 2023-10-21 – 2023-10-24 (×2): qty 30, 30d supply, fill #1

## 2023-09-17 NOTE — Telephone Encounter (Signed)
 Okay for refill?

## 2023-09-17 NOTE — Telephone Encounter (Signed)
 Refilled Alprazolam.  Needs office follow up.  Kristian Covey MD [facility]

## 2023-09-18 ENCOUNTER — Other Ambulatory Visit (HOSPITAL_COMMUNITY): Payer: Self-pay

## 2023-09-19 ENCOUNTER — Other Ambulatory Visit: Payer: Self-pay

## 2023-09-22 DIAGNOSIS — M25511 Pain in right shoulder: Secondary | ICD-10-CM | POA: Diagnosis not present

## 2023-09-23 ENCOUNTER — Other Ambulatory Visit (HOSPITAL_COMMUNITY): Payer: Self-pay

## 2023-09-24 NOTE — Telephone Encounter (Signed)
 Noted.

## 2023-09-25 DIAGNOSIS — M25611 Stiffness of right shoulder, not elsewhere classified: Secondary | ICD-10-CM | POA: Diagnosis not present

## 2023-09-25 DIAGNOSIS — M25511 Pain in right shoulder: Secondary | ICD-10-CM | POA: Diagnosis not present

## 2023-09-30 ENCOUNTER — Other Ambulatory Visit: Payer: Self-pay | Admitting: Family Medicine

## 2023-09-30 ENCOUNTER — Other Ambulatory Visit (HOSPITAL_COMMUNITY): Payer: Self-pay

## 2023-09-30 ENCOUNTER — Ambulatory Visit (INDEPENDENT_AMBULATORY_CARE_PROVIDER_SITE_OTHER): Payer: PPO | Admitting: Family Medicine

## 2023-09-30 ENCOUNTER — Encounter: Payer: Self-pay | Admitting: Family Medicine

## 2023-09-30 VITALS — BP 130/60 | HR 62 | Temp 97.8°F | Wt 209.1 lb

## 2023-09-30 DIAGNOSIS — M25611 Stiffness of right shoulder, not elsewhere classified: Secondary | ICD-10-CM | POA: Diagnosis not present

## 2023-09-30 DIAGNOSIS — M25511 Pain in right shoulder: Secondary | ICD-10-CM | POA: Diagnosis not present

## 2023-09-30 DIAGNOSIS — R21 Rash and other nonspecific skin eruption: Secondary | ICD-10-CM

## 2023-09-30 MED ORDER — TRIAMCINOLONE ACETONIDE 0.1 % EX CREA: 1.0000 | TOPICAL_CREAM | Freq: Two times a day (BID) | CUTANEOUS | 0 refills | Status: DC

## 2023-09-30 NOTE — Progress Notes (Signed)
 Established Patient Office Visit  Subjective   Patient ID: Francis Campbell., male    DOB: 10-Sep-1946  Age: 78 y.o. MRN: [id number]  No chief complaint on file.   HPI   Francis Campbell is seen today with rash on his upper thoracic area.  He states on Saturday 9 days ago he was walking outdoors and walked into a spiderweb.  Did not see a spider but after he got back in his vehicle he leaned back against the seat and felt a sharp pain in the area of the rash.  He noticed some erythema right away.  Never saw any actual spider or insect.  This felt sharp almost like a bee sting.  Applied some topical Benadryl  cream and Neosporin for couple days.  He kept a bandage over this for a few days and had a little bit of allergic reaction from the bandage but is left off bandage since then.  No fevers or chills.  Still has some associated pain but mostly pruritus at this time.  Past Medical History:  Diagnosis Date   Allergy    Aortic regurgitation    Arthritis    Ascending aortic aneurysm (HCC) 04/30/2022   Asthma    only as child   Atrial flutter (HCC)    a. Dx 09/2012.   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   TIA (transient ischemic attack)    ~2005 - had visual symptoms, was told by Dr. Dominick this may have been a TIA but it's unclear   Valvular heart disease    Mild AI, ASclerosis without AS by echo 10/2011   Past Surgical History:  Procedure Laterality Date   CARDIOVERSION N/A 05/08/2013   Procedure: CARDIOVERSION;  Surgeon: Debby Dominick, MD;  Location: [facility] ENDOSCOPY;  Service: Cardiovascular;  Laterality: N/A;   CATARACT EXTRACTION     Right 12/2019   CATARACT EXTRACTION     CYSTOSCOPY/RETROGRADE/URETEROSCOPY/STONE EXTRACTION WITH BASKET Right  02/12/2013   Procedure: CYSTOSCOPY/RETROGRADE/URETEROSCOPY/STONE EXTRACTION WITH BASKET, WITH  STENT PLACEMENT RIGHT URETERAL DILATION;  Surgeon: Norleen JINNY Seltzer, MD;  Location: [facility] ORS;  Service: Urology;  Laterality: Right;   EYE SURGERY Bilateral 1950, 1960, 1964   left, right, left   HERNIA REPAIR  2005   KNEE SURGERY Bilateral 1990, 2000   LITHOTRIPSY     ROTATOR CUFF REPAIR Left 2000   SHOULDER SURGERY Right 04/24/2022   TONSILLECTOMY      reports that he has never smoked. He has never used smokeless tobacco. He reports that he does not drink alcohol and does not use drugs. family history includes Appendicitis in his son; Arthritis in his paternal grandmother; Heart disease in his father and paternal grandmother; Hypertension in his mother; Kidney cancer in his son; Kidney disease in his mother; Leukemia in his father; Nephrolithiasis in his brother and father; Stroke in his mother. Allergies  Allergen Reactions   Chocolate Palpitations   Chocolate Flavoring Agent (Non-Screening) Shortness Of Breath    Other reaction(s): Respiratory Distress   Dextroamphetamine Shortness Of Breath    Other reaction(s): Respiratory Distress   Dextromethorphan Palpitations   Flomax [Tamsulosin Hcl] Other (See Comments)    ? Causes A Fib  Floxin [Ofloxacin] Other (See Comments)    hallucinations   Guaifenesin  Shortness Of Breath   Lisinopril Cough   Tavist Allergy [Clemastine Fumarate] Other (See Comments)    A fib worse   Clemastine     Other reaction(s): Irregular Heart Rate   Robitussin Cold+Flu Daytime  [Dm-Phenylephrine-Acetaminophen ]     Other reaction(s): Respiratory Distress   Tamsulosin Other (See Comments)    Other reaction(s): Irregular Heart Rate   Amiodarone Other (See Comments)    Abn  thyroid    Robitussin Cf [Pseudoephedrine-Dm-Gg] Palpitations    Review of Systems  Constitutional:  Negative for chills and fever.  Skin:  Positive for itching and rash.      Objective:      BP 130/60 (BP Location: Left Arm, Patient Position: Sitting, Cuff Size: Normal)   Pulse 62   Temp 97.8 F (36.6 C) (Oral)   Wt 209 lb 1.6 oz (94.8 kg)   SpO2 96%   BMI 27.59 kg/m  BP Readings from Last 3 Encounters:  09/30/23 130/60  05/21/23 135/78  05/03/23 138/78   Wt Readings from Last 3 Encounters:  09/30/23 209 lb 1.6 oz (94.8 kg)  05/21/23 204 lb (92.5 kg)  05/03/23 202 lb (91.6 kg)      Physical Exam Vitals reviewed.  Constitutional:      General: He is not in acute distress.    Appearance: He is not ill-appearing.  Cardiovascular:     Rate and Rhythm: Normal rate and regular rhythm.  Skin:    Findings: Rash present.     Comments: Left upper thoracic region near the midline reveals area of erythema approximately 3 x 3 cm.  Near the center has slightly indurated area.  No pustules.  No fluctuance.  Neurological:     Mental Status: He is alert.      No results found for any visits on 09/30/23.    The 10-year ASCVD risk score (Arnett DK, et al., 2019) is: 30.8%    Assessment & Plan:   Skin rash upper thoracic region.  Bite would seem unusual this time of year but certainly possible.  Appears to have more of allergic response at this time.  No evidence for abscess.  No evidence for shingles.    -Leave off Neosporin and leave off Benadryl  cream -Triamcinolone  0.1% cream to use twice daily as needed -Avoid any occlusive dressings -Be in touch if not improving over the next week or 2  Francis Scarlet, MD

## 2023-10-02 ENCOUNTER — Other Ambulatory Visit: Payer: Self-pay

## 2023-10-02 MED ORDER — SERTRALINE HCL 50 MG PO TABS
50.0000 mg | ORAL_TABLET | Freq: Every day | ORAL | 0 refills | Status: DC
  Filled 2023-10-02: qty 90, 90d supply, fill #0

## 2023-10-04 ENCOUNTER — Ambulatory Visit: Payer: PPO | Attending: Cardiovascular Disease

## 2023-10-04 DIAGNOSIS — Z5181 Encounter for therapeutic drug level monitoring: Secondary | ICD-10-CM | POA: Diagnosis not present

## 2023-10-04 DIAGNOSIS — I480 Paroxysmal atrial fibrillation: Secondary | ICD-10-CM

## 2023-10-04 LAB — POCT INR: INR: 2.7 (ref 2.0–3.0)

## 2023-10-04 NOTE — Patient Instructions (Signed)
 continue taking warfarin 5mg  daily except 7.5mg  every Fridays.  Recheck INR in 6 weeks.   [facility] [phone]

## 2023-10-07 ENCOUNTER — Other Ambulatory Visit: Payer: Self-pay | Admitting: Cardiovascular Disease

## 2023-10-07 ENCOUNTER — Other Ambulatory Visit (HOSPITAL_COMMUNITY): Payer: Self-pay

## 2023-10-07 ENCOUNTER — Other Ambulatory Visit: Payer: Self-pay

## 2023-10-07 DIAGNOSIS — M25511 Pain in right shoulder: Secondary | ICD-10-CM | POA: Diagnosis not present

## 2023-10-07 DIAGNOSIS — M25611 Stiffness of right shoulder, not elsewhere classified: Secondary | ICD-10-CM | POA: Diagnosis not present

## 2023-10-07 DIAGNOSIS — I480 Paroxysmal atrial fibrillation: Secondary | ICD-10-CM

## 2023-10-07 MED ORDER — WARFARIN SODIUM 5 MG PO TABS
ORAL_TABLET | ORAL | 1 refills | Status: DC
  Filled 2023-10-07: qty 100, 66d supply, fill #0
  Filled 2024-01-13: qty 100, 66d supply, fill #1

## 2023-10-07 NOTE — Telephone Encounter (Signed)
Warfarin 5mg  refill Afib Last INR 10/04/23 Last OV 05/21/23

## 2023-10-08 DIAGNOSIS — M25511 Pain in right shoulder: Secondary | ICD-10-CM | POA: Diagnosis not present

## 2023-10-17 DIAGNOSIS — E7800 Pure hypercholesterolemia, unspecified: Secondary | ICD-10-CM | POA: Diagnosis not present

## 2023-10-18 ENCOUNTER — Encounter (HOSPITAL_BASED_OUTPATIENT_CLINIC_OR_DEPARTMENT_OTHER): Payer: Self-pay

## 2023-10-18 DIAGNOSIS — M25611 Stiffness of right shoulder, not elsewhere classified: Secondary | ICD-10-CM | POA: Diagnosis not present

## 2023-10-18 DIAGNOSIS — M25511 Pain in right shoulder: Secondary | ICD-10-CM | POA: Diagnosis not present

## 2023-10-18 LAB — LIPID PANEL
Chol/HDL Ratio: 2.8 {ratio} (ref 0.0–5.0)
Cholesterol, Total: 91 mg/dL — ABNORMAL LOW (ref 100–199)
HDL: 33 mg/dL — ABNORMAL LOW (ref >39)
LDL Chol Calc (NIH): 32 mg/dL (ref 0–99)
Triglycerides: 156 mg/dL — ABNORMAL HIGH (ref 0–149)
VLDL Cholesterol Cal: 26 mg/dL (ref 5–40)

## 2023-10-18 LAB — HEPATIC FUNCTION PANEL
ALT: 59 [IU]/L — ABNORMAL HIGH (ref 0–44)
AST: 34 [IU]/L (ref 0–40)
Albumin: 3.9 g/dL (ref 3.8–4.8)
Alkaline Phosphatase: 88 [IU]/L (ref 44–121)
Bilirubin Total: 0.5 mg/dL (ref 0.0–1.2)
Bilirubin, Direct: 0.2 mg/dL (ref 0.00–0.40)
Total Protein: 6 g/dL (ref 6.0–8.5)

## 2023-10-21 ENCOUNTER — Other Ambulatory Visit (HOSPITAL_COMMUNITY): Payer: Self-pay

## 2023-10-21 DIAGNOSIS — M25511 Pain in right shoulder: Secondary | ICD-10-CM | POA: Diagnosis not present

## 2023-10-21 DIAGNOSIS — M25611 Stiffness of right shoulder, not elsewhere classified: Secondary | ICD-10-CM | POA: Diagnosis not present

## 2023-10-24 ENCOUNTER — Other Ambulatory Visit: Payer: Self-pay

## 2023-10-24 ENCOUNTER — Other Ambulatory Visit (HOSPITAL_COMMUNITY): Payer: Self-pay

## 2023-10-28 DIAGNOSIS — M25511 Pain in right shoulder: Secondary | ICD-10-CM | POA: Diagnosis not present

## 2023-10-28 DIAGNOSIS — M25611 Stiffness of right shoulder, not elsewhere classified: Secondary | ICD-10-CM | POA: Diagnosis not present

## 2023-11-04 DIAGNOSIS — M25511 Pain in right shoulder: Secondary | ICD-10-CM | POA: Diagnosis not present

## 2023-11-04 DIAGNOSIS — M25611 Stiffness of right shoulder, not elsewhere classified: Secondary | ICD-10-CM | POA: Diagnosis not present

## 2023-11-04 DIAGNOSIS — H6123 Impacted cerumen, bilateral: Secondary | ICD-10-CM | POA: Diagnosis not present

## 2023-11-11 ENCOUNTER — Other Ambulatory Visit (HOSPITAL_COMMUNITY): Payer: Self-pay

## 2023-11-11 ENCOUNTER — Other Ambulatory Visit: Payer: Self-pay | Admitting: Family Medicine

## 2023-11-11 MED ORDER — ALPRAZOLAM 0.25 MG PO TABS
0.2500 mg | ORAL_TABLET | Freq: Every evening | ORAL | 5 refills | Status: DC | PRN
  Filled 2023-11-11 – 2023-11-21 (×2): qty 30, 30d supply, fill #0
  Filled 2023-12-19: qty 30, 30d supply, fill #1
  Filled 2024-01-16: qty 30, 30d supply, fill #2
  Filled 2024-02-17: qty 30, 30d supply, fill #3
  Filled 2024-03-16: qty 30, 30d supply, fill #4
  Filled 2024-04-13: qty 30, 30d supply, fill #5

## 2023-11-12 ENCOUNTER — Other Ambulatory Visit (HOSPITAL_COMMUNITY): Payer: Self-pay

## 2023-11-15 ENCOUNTER — Ambulatory Visit: Payer: PPO | Attending: Internal Medicine | Admitting: *Deleted

## 2023-11-15 DIAGNOSIS — I480 Paroxysmal atrial fibrillation: Secondary | ICD-10-CM | POA: Diagnosis not present

## 2023-11-15 DIAGNOSIS — Z5181 Encounter for therapeutic drug level monitoring: Secondary | ICD-10-CM

## 2023-11-15 LAB — POCT INR: INR: 3.4 — AB (ref 2.0–3.0)

## 2023-11-15 NOTE — Patient Instructions (Addendum)
 Description   Do not take any warfarin tomorrow then continue taking warfarin 5mg  daily except 7.5mg  every Fridays.  Recheck INR in 5 weeks.   [facility] [phone]

## 2023-11-18 ENCOUNTER — Other Ambulatory Visit (HOSPITAL_COMMUNITY): Payer: Self-pay

## 2023-11-21 ENCOUNTER — Other Ambulatory Visit (HOSPITAL_COMMUNITY): Payer: Self-pay

## 2023-11-21 ENCOUNTER — Other Ambulatory Visit: Payer: Self-pay

## 2023-12-02 ENCOUNTER — Other Ambulatory Visit (HOSPITAL_COMMUNITY): Payer: Self-pay

## 2023-12-09 ENCOUNTER — Other Ambulatory Visit (HOSPITAL_COMMUNITY): Payer: Self-pay

## 2023-12-09 ENCOUNTER — Other Ambulatory Visit: Payer: Self-pay

## 2023-12-09 MED ORDER — POTASSIUM CITRATE ER 10 MEQ (1080 MG) PO TBCR
10.0000 meq | EXTENDED_RELEASE_TABLET | Freq: Three times a day (TID) | ORAL | 3 refills | Status: AC
  Filled 2023-12-09: qty 270, 90d supply, fill #0
  Filled 2024-04-13: qty 270, 90d supply, fill #1
  Filled 2024-08-29: qty 270, 90d supply, fill #2

## 2023-12-10 ENCOUNTER — Other Ambulatory Visit: Payer: Self-pay

## 2023-12-11 ENCOUNTER — Other Ambulatory Visit (HOSPITAL_COMMUNITY): Payer: Self-pay

## 2023-12-16 ENCOUNTER — Other Ambulatory Visit (HOSPITAL_COMMUNITY): Payer: Self-pay

## 2023-12-16 ENCOUNTER — Other Ambulatory Visit (HOSPITAL_BASED_OUTPATIENT_CLINIC_OR_DEPARTMENT_OTHER): Payer: Self-pay | Admitting: Cardiovascular Disease

## 2023-12-16 MED ORDER — FLECAINIDE ACETATE 100 MG PO TABS
100.0000 mg | ORAL_TABLET | Freq: Two times a day (BID) | ORAL | 2 refills | Status: DC
  Filled 2023-12-16: qty 180, 90d supply, fill #0
  Filled 2024-03-16: qty 180, 90d supply, fill #1
  Filled 2024-06-14: qty 180, 90d supply, fill #2

## 2023-12-17 ENCOUNTER — Other Ambulatory Visit: Payer: Self-pay

## 2023-12-19 ENCOUNTER — Other Ambulatory Visit: Payer: Self-pay

## 2023-12-19 ENCOUNTER — Other Ambulatory Visit (HOSPITAL_COMMUNITY): Payer: Self-pay

## 2023-12-20 ENCOUNTER — Ambulatory Visit: Payer: PPO | Attending: Cardiovascular Disease

## 2023-12-20 DIAGNOSIS — I480 Paroxysmal atrial fibrillation: Secondary | ICD-10-CM | POA: Diagnosis not present

## 2023-12-20 LAB — POCT INR: INR: 4.1 — AB (ref 2.0–3.0)

## 2023-12-20 NOTE — Patient Instructions (Signed)
 Description   HOLD today's dose and then START taking warfarin 5mg  daily.   Recheck INR in 4 weeks.   [facility] [phone]

## 2023-12-21 ENCOUNTER — Other Ambulatory Visit (HOSPITAL_COMMUNITY): Payer: Self-pay

## 2023-12-23 ENCOUNTER — Other Ambulatory Visit (HOSPITAL_COMMUNITY): Payer: Self-pay

## 2023-12-23 ENCOUNTER — Other Ambulatory Visit: Payer: Self-pay | Admitting: Cardiovascular Disease

## 2023-12-23 MED ORDER — LOSARTAN POTASSIUM 25 MG PO TABS
25.0000 mg | ORAL_TABLET | Freq: Every day | ORAL | 2 refills | Status: DC
  Filled 2023-12-23: qty 90, 90d supply, fill #0
  Filled 2024-03-16: qty 90, 90d supply, fill #1
  Filled 2024-06-14: qty 90, 90d supply, fill #2

## 2023-12-24 ENCOUNTER — Other Ambulatory Visit (HOSPITAL_COMMUNITY): Payer: Self-pay

## 2023-12-30 ENCOUNTER — Other Ambulatory Visit (HOSPITAL_COMMUNITY): Payer: Self-pay

## 2023-12-30 ENCOUNTER — Other Ambulatory Visit: Payer: Self-pay | Admitting: Family Medicine

## 2024-01-01 ENCOUNTER — Other Ambulatory Visit: Payer: Self-pay

## 2024-01-01 ENCOUNTER — Other Ambulatory Visit (HOSPITAL_COMMUNITY): Payer: Self-pay

## 2024-01-01 MED ORDER — SERTRALINE HCL 50 MG PO TABS
50.0000 mg | ORAL_TABLET | Freq: Every day | ORAL | 0 refills | Status: DC
  Filled 2024-01-01: qty 90, 90d supply, fill #0

## 2024-01-06 ENCOUNTER — Ambulatory Visit (INDEPENDENT_AMBULATORY_CARE_PROVIDER_SITE_OTHER): Payer: PPO

## 2024-01-06 VITALS — Ht 73.0 in | Wt 209.0 lb

## 2024-01-06 DIAGNOSIS — Z0000 Encounter for general adult medical examination without abnormal findings: Secondary | ICD-10-CM

## 2024-01-06 NOTE — Progress Notes (Signed)
 Subjective:   Francis Campbell. is a 78 y.o. who presents for a Medicare Wellness preventive visit.  Visit Complete: Virtual I connected with  Francis Campbell. on 01/06/24 by a audio enabled telemedicine application and verified that I am speaking with the correct person using two identifiers.  Patient Location: Home  Provider Location: Home Office  I discussed the limitations of evaluation and management by telemedicine. The patient expressed understanding  agreed to proceed.  Vital Signs: Because this visit was a virtual/telehealth visit, some criteria may be missing or patient reported. Any vitals not documented were not able to be obtained and vitals that have been documented are patient reported.    Persons Participating in Visit: Patient.  AWV Questionnaire: No: Patient Medicare AWV questionnaire was not completed prior to this visit.  Cardiac Risk Factors include: advanced age (>51men, >33 women);male gender;hypertension     Objective:    Today's Vitals   01/06/24 1504  Weight: 209 lb (94.8 kg)  Height: 6\' 1"  (1.854 m)   Body mass index is 27.57 kg/m.     01/06/2024    3:10 PM 12/31/2022    2:51 PM 12/08/2022    1:30 PM 05/14/2022    8:31 AM 05/08/2021    2:37 PM 05/04/2020    2:53 PM 11/20/2019    5:21 AM  Advanced Directives  Does Patient Have a Medical Advance Directive? Yes Yes No Yes Yes Yes No  Type of Estate agent of [place];Living will Healthcare Power of [place];Living will  Healthcare Power of [place];Living will Healthcare Power of [place];Living will Healthcare Power of [place];Living will   Does patient want to make changes to medical advance directive?      No - Patient declined   Copy of Healthcare Power of Attorney in Chart? No - copy requested No - copy requested  No - copy requested No - copy requested No - copy requested   Would patient like information on creating a medical advance directive?   No - Patient declined     No - Patient declined    Current Medications (verified) Outpatient Encounter Medications as of 01/06/2024  Medication Sig   ALPRAZolam  (XANAX ) 0.25 MG tablet Take 1 tablet (0.25 mg total) by mouth at bedtime as needed.   Cholecalciferol  (VITAMIN D3) 1000 UNITS CAPS Take 2,000 Units by mouth every morning.   diltiazem  (CARDIZEM  CD) 240 MG 24 hr capsule TAKE 1 CAPSULE BY MOUTH EVERY MORNING   ezetimibe  (ZETIA ) 10 MG tablet Take 1 tablet (10 mg total) by mouth daily.   flecainide  (TAMBOCOR ) 100 MG tablet Take 1 tablet (100 mg total) by mouth 2 (two) times daily.   losartan  (COZAAR ) 25 MG tablet Take 1 tablet (25 mg total) by mouth daily.   metoprolol  succinate (TOPROL -XL) 25 MG 24 hr tablet TAKE 1 TABLET EVERY DAY WITH OR IMMEDIATELY FOLLOWING A MEAL   metoprolol  succinate (TOPROL -XL) 25 MG 24 hr tablet Take 1 tablet (25 mg total) by mouth daily with or immediately after a meal.   Multiple Vitamin (MULTIVITAMIN) tablet Take 1 tablet by mouth daily.   potassium citrate  (UROCIT-K ) 10 MEQ (1080 MG) SR tablet Take 1 tablet (10 mEq total) by mouth 3 (three) times daily.   potassium citrate  (UROCIT-K ) 10 MEQ (1080 MG) SR tablet Take 1 tablet (10 mEq total) by mouth 3 (three) times daily. Please call to schedule a 6 month follow up.   rosuvastatin  (CRESTOR ) 40 MG tablet Take 1 tablet (40  mg total) by mouth daily.   sertraline  (ZOLOFT ) 50 MG tablet Take 1 tablet (50 mg total) by mouth daily. Need appointment for refills   triamcinolone  cream (KENALOG ) 0.1 % Apply 1 Application topically 2 (two) times daily.   warfarin (COUMADIN ) 5 MG tablet TAKE 1 TO 1 AND 1/2 TABLETS DAILY AS DIRECTED BY COUMADIN  CLINIC   No facility-administered encounter medications on file as of 01/06/2024.    Allergies (verified) Chocolate, Chocolate flavoring agent (non-screening), Dextroamphetamine, Dextromethorphan, Flomax [tamsulosin hcl], Floxin [ofloxacin], Guaifenesin , Lisinopril, Tavist allergy [clemastine fumarate],  Clemastine, Robitussin cold+flu daytime  [dm-phenylephrine-acetaminophen ], Tamsulosin, Amiodarone, and Robitussin cf [pseudoephedrine-dm-gg]   History: Past Medical History:  Diagnosis Date   Allergy    Aortic regurgitation    Arthritis    Ascending aortic aneurysm (HCC) 04/30/2022   Asthma    only as child   Atrial flutter (HCC)    a. Dx 09/2012.   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   TIA (transient ischemic attack)    ~2005 - had visual symptoms, was told by Dr. Santiago Cuff this may have been a TIA but it's unclear   Valvular heart disease    Mild AI, ASclerosis without AS by echo 10/2011   Past Surgical History:  Procedure Laterality Date   CARDIOVERSION N/A 05/08/2013   Procedure: CARDIOVERSION;  Surgeon: Driscilla George, MD;  Location: [facility] ENDOSCOPY;  Service: Cardiovascular;  Laterality: N/A;   CATARACT EXTRACTION     Right 12/2019   CATARACT EXTRACTION     CYSTOSCOPY/RETROGRADE/URETEROSCOPY/STONE EXTRACTION WITH BASKET Right 02/12/2013   Procedure: CYSTOSCOPY/RETROGRADE/URETEROSCOPY/STONE EXTRACTION WITH BASKET, WITH  STENT PLACEMENT RIGHT URETERAL DILATION;  Surgeon: Willye Harvey, MD;  Location: [facility] ORS;  Service: Urology;  Laterality: Right;   EYE SURGERY Bilateral 1950, 1960, 1964   left, right, left   HERNIA REPAIR  2005   KNEE SURGERY Bilateral 1990, 2000   LITHOTRIPSY     ROTATOR CUFF REPAIR Left 2000   SHOULDER SURGERY Right 04/24/2022   TONSILLECTOMY     Family History  Problem Relation Age of Onset   Stroke Mother    Hypertension Mother    Kidney disease Mother        stones   Nephrolithiasis Brother    Arthritis Paternal Grandmother    Heart disease Paternal Grandmother    Leukemia Father    Heart disease Father     Nephrolithiasis Father    Kidney cancer Son    Appendicitis Son    Heart attack Neg Hx    Colon cancer Neg Hx    Esophageal cancer Neg Hx    Pancreatic cancer Neg Hx    Stomach cancer Neg Hx    Liver disease Neg Hx    Social History   Socioeconomic History   Marital status: Married    Spouse name: Not on file   Number of children: 2   Years of education: Not on file   Highest education level: Not on file  Occupational History   Occupation: TECHNICAL SUP    Employer: [employer]    Comment: retired  Tobacco Use   Smoking status: Never   Smokeless tobacco: Never  Vaping Use   Vaping status:  Never Used  Substance and Sexual Activity   Alcohol use: No   Drug use: No   Sexual activity: Yes    Partners: Female    Birth control/protection: None  Other Topics Concern   Not on file  Social History Narrative   Non smoker  No ets          Social Drivers of Corporate investment banker Strain: Low Risk  (01/06/2024)   Overall Financial Resource Strain (CARDIA)    Difficulty of Paying Living Expenses: Not hard at all  Food Insecurity: No Food Insecurity (01/06/2024)   Hunger Vital Sign    Worried About Running Out of Food in the Last Year: Never true    Ran Out of Food in the Last Year: Never true  Transportation Needs: No Transportation Needs (01/06/2024)   PRAPARE - Administrator, Civil Service (Medical): No    Lack of Transportation (Non-Medical): No  Physical Activity: Inactive (01/06/2024)   Exercise Vital Sign    Days of Exercise per Week: 0 days    Minutes of Exercise per Session: 0 min  Stress: No Stress Concern Present (01/06/2024)   [employer] of Occupational Health - Occupational Stress Questionnaire    Feeling of Stress : Not at all  Social Connections: Socially Integrated (01/06/2024)   Social Connection and Isolation Panel [NHANES]    Frequency of Communication with Friends and Family: More than three times a week    Frequency of Social  Gatherings with Friends and Family: More than three times a week    Attends Religious Services: More than 4 times per year    Active Member of [employer] or Organizations: Yes    Attends Engineer, structural: More than 4 times per year    Marital Status: Married    Tobacco Counseling Counseling given: Not Answered    Clinical Intake:  Pre-visit preparation completed: Yes  Pain : No/denies pain     BMI - recorded: 27.57 Nutritional Status: BMI 25 -29 Overweight Nutritional Risks: None Diabetes: No  No results found for: "HGBA1C"   How often do you need to have someone help you when you read instructions, pamphlets, or other written materials from your doctor or pharmacy?: 1 - Never  Interpreter Needed?: No  Information entered by :: Farris Hong LPN   Activities of Daily Living     01/06/2024    3:09 PM  In your present state of health, do you have any difficulty performing the following activities:  Hearing? 1  Comment Wears Hearing Aids  Vision? 0  Difficulty concentrating or making decisions? 0  Walking or climbing stairs? 0  Dressing or bathing? 0  Doing errands, shopping? 0  Preparing Food and eating ? N  Using the Toilet? N  In the past six months, have you accidently leaked urine? N  Do you have problems with loss of bowel control? N  Managing your Medications? N  Managing your Finances? N  Housekeeping or managing your Housekeeping? N    Patient Care Team: Marquetta Sit, MD as PCP - General (Family Medicine) Maudine Sos, MD as PCP - Cardiology (Cardiology) Jacolyn Matar, MD as Consulting Physician (General Surgery) Janel Medford, MD (Inactive) as Attending Physician (Gastroenterology) Homero Luster, MD as Attending Physician (Urology) Alver Austin, [facility] (Inactive) as Pharmacist (Pharmacist)  Indicate any recent Medical Services you may have received from other than [facility] providers in the past year (date may be  approximate).  Assessment:   This is a routine wellness examination for [facility]  Hearing/Vision screen Hearing Screening - Comments:: Wears Hearing Aids Vision Screening - Comments:: Wears rx glasses - up to date with routine eye exams with  [facility]   Goals Addressed               This Visit's Progress     Increase physical activity (pt-stated)        Remain Active.       Depression Screen      01/06/2024    3:08 PM 12/31/2022    2:49 PM 05/14/2022    8:32 AM 04/16/2022   10:41 AM 01/16/2022    9:13 AM 05/08/2021    2:35 PM 05/04/2020    2:55 PM  PHQ 2/9 Scores  PHQ - 2 Score 0 0 0 0 1 0 0  PHQ- 9 Score    0 5  0    Fall Risk      01/06/2024    3:09 PM 12/31/2022    2:50 PM 05/14/2022    8:32 AM 05/12/2022   10:44 AM 05/08/2021    2:37 PM  Fall Risk   Falls in the past year? 0 0 0 0 0  Number falls in past yr: 0 0 0 0 0  Injury with Fall? 0 0 0 0 0  Risk for fall due to : No Fall Risks No Fall Risks Medication side effect  No Fall Risks  Follow up Falls prevention discussed;Falls evaluation completed Falls prevention discussed Falls evaluation completed;Education provided;Falls prevention discussed  Falls evaluation completed    MEDICARE RISK AT HOME:   Medicare Risk at Home Any stairs in or around the home?: No If so, are there any without handrails?: No Home free of loose throw rugs in walkways, pet beds, electrical cords, etc?: Yes Adequate lighting in your home to reduce risk of falls?: Yes Life alert?: No Use of a cane, walker or w/c?: No Grab bars in the bathroom?: No Shower chair or bench in shower?: No Elevated toilet seat or a handicapped toilet?: No  TIMED UP AND GO:  Was the test performed?  No  Cognitive Function: 6CIT completed    10/11/2016   10:54 AM  MMSE - Mini Mental State Exam  Not completed: --        01/06/2024    3:10 PM 12/31/2022    2:51 PM 05/14/2022    8:33 AM  6CIT Screen  What Year? 0 points 0 points 0 points   What month? 0 points 0 points 0 points  What time? 0 points 0 points 0 points  Count back from 20 0 points 0 points 0 points  Months in reverse 0 points 0 points 0 points  Repeat phrase 0 points 0 points 0 points  Total Score 0 points 0 points 0 points    Immunizations Immunization History  Administered Date(s) Administered   Fluad Quad(high Dose 65+) 05/28/2019, 06/13/2020, 05/29/2021, 06/05/2022   Influenza Split 07/30/2011, 07/06/2013   Influenza, High Dose Seasonal PF 05/26/2015, 05/18/2016, 06/18/2017, 07/07/2018   Influenza,inj,Quad PF,6+ Mos 06/16/2014   PFIZER(Purple Top)SARS-COV-2 Vaccination 10/22/2019, 11/17/2019, 06/14/2020, 12/30/2020   Pfizer Covid-19 Vaccine Bivalent Booster 67yrs & up 06/19/2021   Pneumococcal Conjugate-13 09/17/2010, 08/31/2013   Pneumococcal Polysaccharide-23 04/08/2015   Tdap 11/27/2011   Zoster Recombinant(Shingrix) 06/19/2021   Zoster, Live 09/28/2010    Screening Tests Health Maintenance  Topic Date Due   Zoster Vaccines- Shingrix (2 of 2) 08/14/2021  DTaP/Tdap/Td (2 - Td or Tdap) 11/26/2021   COVID-19 Vaccine (6 - 2024-25 season) 05/19/2023   INFLUENZA VACCINE  04/17/2024   Medicare Annual Wellness (AWV)  01/05/2025   Pneumonia Vaccine 62+ Years old  Completed   Hepatitis C Screening  Completed   HPV VACCINES  Aged Out   Meningococcal B Vaccine  Aged Out   Colonoscopy  Discontinued    Health Maintenance  Health Maintenance Due  Topic Date Due   Zoster Vaccines- Shingrix (2 of 2) 08/14/2021   DTaP/Tdap/Td (2 - Td or Tdap) 11/26/2021   COVID-19 Vaccine (6 - 2024-25 season) 05/19/2023   Health Maintenance Items Addressed: Zoster/Shingles and DTaP/Tdap Vaccines Deferred  Additional Screening:  Vision Screening: Recommended annual ophthalmology exams for early detection of glaucoma and other disorders of the eye.  Dental Screening: Recommended annual dental exams for proper oral hygiene  Community Resource Referral /  Chronic Care Management: CRR required this visit?  No   CCM required this visit?  No     Plan:     I have personally reviewed and noted the following in the patient's chart:   Medical and social history Use of alcohol, tobacco or illicit drugs  Current medications and supplements including opioid prescriptions. Patient is not currently taking opioid prescriptions. Functional ability and status Nutritional status Physical activity Advanced directives List of other physicians Hospitalizations, surgeries, and ER visits in previous 12 months Vitals Screenings to include cognitive, depression, and falls Referrals and appointments  In addition, I have reviewed and discussed with patient certain preventive protocols, quality metrics, and best practice recommendations. A written personalized care plan for preventive services as well as general preventive health recommendations were provided to patient.     Dewayne Ford, LPN   1/61/0960   After Visit Summary: (MyChart) Due to this being a telephonic visit, the after visit summary with patients personalized plan was offered to patient via MyChart   Notes: Nothing significant to report at this time.

## 2024-01-06 NOTE — Patient Instructions (Addendum)
 Francis Campbell , Thank you for taking time to come for your Medicare Wellness Visit. I appreciate your ongoing commitment to your health goals. Please review the following plan we discussed and let me know if I can assist you in the future.   Referrals/Orders/Follow-Ups/Clinician Recommendations:   This is a list of the screening recommended for you and due dates:  Health Maintenance  Topic Date Due   Zoster (Shingles) Vaccine (2 of 2) 08/14/2021   DTaP/Tdap/Td vaccine (2 - Td or Tdap) 11/26/2021   COVID-19 Vaccine (6 - 2024-25 season) 05/19/2023   Flu Shot  04/17/2024   Medicare Annual Wellness Visit  01/05/2025   Pneumonia Vaccine  Completed   Hepatitis C Screening  Completed   HPV Vaccine  Aged Out   Meningitis B Vaccine  Aged Out   Colon Cancer Screening  Discontinued    Advanced directives: (Copy Requested) Please bring a copy of your health care power of attorney and living will to the office to be added to your chart at your convenience. You can mail to [facility] [address] [place], [place] [address] or email to [email or web address]  Next Medicare Annual Wellness Visit scheduled for next year: Yes

## 2024-01-08 ENCOUNTER — Ambulatory Visit: Attending: Cardiovascular Disease

## 2024-01-08 DIAGNOSIS — I480 Paroxysmal atrial fibrillation: Secondary | ICD-10-CM

## 2024-01-08 LAB — POCT INR: INR: 3.1 — AB (ref 2.0–3.0)

## 2024-01-08 NOTE — Patient Instructions (Signed)
 Description   Eat greens today and continue taking warfarin 5mg  daily.   Recheck INR in 4 weeks.   Coumadin  Clinic [phone]

## 2024-01-13 ENCOUNTER — Other Ambulatory Visit (HOSPITAL_COMMUNITY): Payer: Self-pay

## 2024-01-16 ENCOUNTER — Other Ambulatory Visit: Payer: Self-pay

## 2024-01-20 ENCOUNTER — Encounter: Payer: Self-pay | Admitting: Family Medicine

## 2024-01-20 ENCOUNTER — Other Ambulatory Visit (HOSPITAL_COMMUNITY): Payer: Self-pay

## 2024-01-20 ENCOUNTER — Other Ambulatory Visit: Payer: Self-pay

## 2024-01-20 MED ORDER — BENZONATATE 100 MG PO CAPS
100.0000 mg | ORAL_CAPSULE | Freq: Three times a day (TID) | ORAL | 0 refills | Status: DC | PRN
  Filled 2024-01-20 (×2): qty 30, 10d supply, fill #0

## 2024-01-21 ENCOUNTER — Other Ambulatory Visit: Payer: Self-pay

## 2024-01-27 ENCOUNTER — Encounter: Payer: Self-pay | Admitting: Family Medicine

## 2024-01-27 ENCOUNTER — Ambulatory Visit (INDEPENDENT_AMBULATORY_CARE_PROVIDER_SITE_OTHER): Admitting: Family Medicine

## 2024-01-27 VITALS — BP 126/64 | HR 65 | Temp 97.4°F | Wt 200.7 lb

## 2024-01-27 DIAGNOSIS — J209 Acute bronchitis, unspecified: Secondary | ICD-10-CM | POA: Diagnosis not present

## 2024-01-27 MED ORDER — BENZONATATE 100 MG PO CAPS: 100.0000 mg | ORAL_CAPSULE | Freq: Three times a day (TID) | ORAL | 0 refills | Status: DC | PRN

## 2024-01-27 NOTE — Progress Notes (Signed)
 Established Patient Office Visit  Subjective   Patient ID: Francis Campbell., male    DOB: 03/10/1946  Age: 78 y.o. MRN: [id number]  Chief Complaint  Patient presents with   Cough    Patient complains of cough, x2.5 weeks, Tried Tessalon    Sore Throat    Patient complains of sore throat,x2.5 weeks,    Nasal Congestion    Patient complains of nasal congestion, x2.5 weeks     HPI   Francis Campbell is seen with approximately 2-1/2-week history of nasal congestion and cough.  Had some initial sore throat which has improved.  No fever.  No headaches.  Has been taking Tessalon  Perles which has helped with his cough.  Denies any dyspnea.  He states his wife was just diagnosed with pneumonia.  He denies any wheezing.  No chills.  No night sweats.  Appetite stable.  He has lost some weight intentionally over the past year  Past Medical History:  Diagnosis Date   Allergy    Aortic regurgitation    Arthritis    Ascending aortic aneurysm (HCC) 04/30/2022   Asthma    only as child   Atrial flutter (HCC)    a. Dx 09/2012.   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   TIA (transient ischemic attack)    ~2005 - had visual symptoms, was told by Dr. Santiago Cuff this may have been a TIA but it's unclear   Valvular heart disease    Mild AI, ASclerosis without AS by echo 10/2011   Past Surgical History:  Procedure Laterality Date   CARDIOVERSION N/A 05/08/2013   Procedure: CARDIOVERSION;  Surgeon: Driscilla George, MD;  Location: [facility] ENDOSCOPY;  Service: Cardiovascular;  Laterality: N/A;   CATARACT EXTRACTION     Right 12/2019   CATARACT EXTRACTION     CYSTOSCOPY/RETROGRADE/URETEROSCOPY/STONE EXTRACTION WITH BASKET Right 02/12/2013   Procedure:  CYSTOSCOPY/RETROGRADE/URETEROSCOPY/STONE EXTRACTION WITH BASKET, WITH  STENT PLACEMENT RIGHT URETERAL DILATION;  Surgeon: Willye Harvey, MD;  Location: [facility] ORS;  Service: Urology;  Laterality: Right;   EYE SURGERY Bilateral 1950, 1960, 1964   left, right, left   HERNIA REPAIR  2005   KNEE SURGERY Bilateral 1990, 2000   LITHOTRIPSY     ROTATOR CUFF REPAIR Left 2000   SHOULDER SURGERY Right 04/24/2022   TONSILLECTOMY      reports that he has never smoked. He has never used smokeless tobacco. He reports that he does not drink alcohol and does not use drugs. family history includes Appendicitis in his son; Arthritis in his paternal grandmother; Heart disease in his father and paternal grandmother; Hypertension in his mother; Kidney cancer in his son; Kidney disease in his mother; Leukemia in his father; Nephrolithiasis in his brother and father; Stroke in his mother. Allergies  Allergen Reactions   Chocolate Palpitations   Chocolate Flavoring Agent (Non-Screening) Shortness Of Breath    Other reaction(s): Respiratory Distress   Dextroamphetamine Shortness Of Breath    Other reaction(s): Respiratory Distress   Dextromethorphan Palpitations   Flomax [Tamsulosin Hcl] Other (See Comments)    ? Causes A Fib   Floxin [Ofloxacin] Other (See Comments)    hallucinations   Guaifenesin  Shortness Of  Breath   Lisinopril Cough   Tavist Allergy [Clemastine Fumarate] Other (See Comments)    A fib worse   Clemastine     Other reaction(s): Irregular Heart Rate   Robitussin Cold+Flu Daytime  [Dm-Phenylephrine-Acetaminophen ]     Other reaction(s): Respiratory Distress   Tamsulosin Other (See Comments)    Other reaction(s): Irregular Heart Rate   Amiodarone Other (See Comments)    Abn  thyroid    Robitussin Cf [Pseudoephedrine-Dm-Gg] Palpitations    Review of Systems  Constitutional:  Negative for chills and fever.  HENT:  Negative for sinus pain.   Respiratory:  Positive for cough. Negative for  hemoptysis, shortness of breath and wheezing.       Objective:     BP 126/64 (BP Location: Left Arm, Patient Position: Sitting, Cuff Size: Normal)   Pulse 65   Temp (!) 97.4 F (36.3 C) (Oral)   Wt 200 lb 11.2 oz (91 kg)   SpO2 97%   BMI 26.48 kg/m  BP Readings from Last 3 Encounters:  01/27/24 126/64  09/30/23 130/60  05/21/23 135/78   Wt Readings from Last 3 Encounters:  01/27/24 200 lb 11.2 oz (91 kg)  01/06/24 209 lb (94.8 kg)  09/30/23 209 lb 1.6 oz (94.8 kg)      Physical Exam Vitals reviewed.  Constitutional:      General: He is not in acute distress.    Appearance: He is not ill-appearing.  HENT:     Right Ear: Tympanic membrane normal.     Left Ear: Tympanic membrane normal.     Mouth/Throat:     Mouth: Mucous membranes are moist.     Pharynx: Oropharynx is clear.  Cardiovascular:     Rate and Rhythm: Normal rate and regular rhythm.  Pulmonary:     Effort: Pulmonary effort is normal.     Breath sounds: Normal breath sounds. No wheezing or rales.  Neurological:     Mental Status: He is alert.      No results found for any visits on 01/27/24.    The ASCVD Risk score (Arnett DK, et al., 2019) failed to calculate for the following reasons:   The valid total cholesterol range is 130 to 320 mg/dL    Assessment & Plan:   Cough.  Suspect viral trigger.  Nonfocal exam at this time.  He has gotten some relief with Tessalon  Perles and we resent in refill 100 mg every 8 hours as needed.  Follow-up promptly for any fever or change in symptoms.  Be in touch if cough not resolving over the next couple of weeks.  Glean Lamy, MD

## 2024-02-03 DIAGNOSIS — H6123 Impacted cerumen, bilateral: Secondary | ICD-10-CM | POA: Diagnosis not present

## 2024-02-05 ENCOUNTER — Ambulatory Visit: Attending: Cardiovascular Disease | Admitting: *Deleted

## 2024-02-05 DIAGNOSIS — I480 Paroxysmal atrial fibrillation: Secondary | ICD-10-CM

## 2024-02-05 DIAGNOSIS — Z5181 Encounter for therapeutic drug level monitoring: Secondary | ICD-10-CM

## 2024-02-05 LAB — POCT INR: INR: 3.3 — AB (ref 2.0–3.0)

## 2024-02-05 NOTE — Patient Instructions (Addendum)
 Description   Do not take any warfarin tomorrow then START taking warfarin 5mg  daily except 2.5mg  on Mondays.  Recheck INR in 3 weeks.   Coumadin  Clinic [phone]

## 2024-02-17 ENCOUNTER — Other Ambulatory Visit (HOSPITAL_COMMUNITY): Payer: Self-pay

## 2024-02-26 ENCOUNTER — Ambulatory Visit: Attending: Cardiovascular Disease

## 2024-02-26 DIAGNOSIS — I480 Paroxysmal atrial fibrillation: Secondary | ICD-10-CM

## 2024-02-26 LAB — POCT INR: INR: 2.1 (ref 2.0–3.0)

## 2024-02-26 NOTE — Patient Instructions (Signed)
 Description   Continue taking warfarin 5mg  daily except 2.5mg  on Mondays.  Recheck INR in 4 weeks.   Coumadin  Clinic [phone]

## 2024-03-02 ENCOUNTER — Other Ambulatory Visit (HOSPITAL_COMMUNITY): Payer: Self-pay

## 2024-03-05 ENCOUNTER — Encounter (HOSPITAL_BASED_OUTPATIENT_CLINIC_OR_DEPARTMENT_OTHER): Payer: Self-pay | Admitting: Cardiovascular Disease

## 2024-03-05 NOTE — Telephone Encounter (Signed)
 Please review medication list & Advise

## 2024-03-11 ENCOUNTER — Ambulatory Visit (HOSPITAL_BASED_OUTPATIENT_CLINIC_OR_DEPARTMENT_OTHER)

## 2024-03-11 DIAGNOSIS — I7121 Aneurysm of the ascending aorta, without rupture: Secondary | ICD-10-CM

## 2024-03-11 DIAGNOSIS — I10 Essential (primary) hypertension: Secondary | ICD-10-CM

## 2024-03-11 DIAGNOSIS — I480 Paroxysmal atrial fibrillation: Secondary | ICD-10-CM | POA: Diagnosis not present

## 2024-03-11 DIAGNOSIS — I351 Nonrheumatic aortic (valve) insufficiency: Secondary | ICD-10-CM

## 2024-03-16 ENCOUNTER — Other Ambulatory Visit (HOSPITAL_COMMUNITY): Payer: Self-pay

## 2024-03-17 ENCOUNTER — Encounter (HOSPITAL_BASED_OUTPATIENT_CLINIC_OR_DEPARTMENT_OTHER): Payer: Self-pay | Admitting: Cardiovascular Disease

## 2024-03-18 ENCOUNTER — Ambulatory Visit: Payer: Self-pay | Admitting: Cardiovascular Disease

## 2024-03-18 LAB — ECHOCARDIOGRAM COMPLETE
AV Vena cont: 0.32 cm
Area-P 1/2: 3.21 cm2
S' Lateral: 2.71 cm

## 2024-03-19 DIAGNOSIS — N202 Calculus of kidney with calculus of ureter: Secondary | ICD-10-CM | POA: Diagnosis not present

## 2024-03-25 ENCOUNTER — Ambulatory Visit: Attending: Cardiovascular Disease

## 2024-03-25 DIAGNOSIS — I480 Paroxysmal atrial fibrillation: Secondary | ICD-10-CM | POA: Diagnosis not present

## 2024-03-25 LAB — POCT INR: INR: 2.5 (ref 2.0–3.0)

## 2024-03-25 NOTE — Patient Instructions (Signed)
 Description   Continue taking warfarin 5mg  daily except 2.5mg  on Mondays.  Recheck INR in 6 weeks.   Coumadin  Clinic [phone]

## 2024-03-25 NOTE — Progress Notes (Signed)
Please see anticoagulation encounter.

## 2024-04-03 ENCOUNTER — Encounter: Payer: Self-pay | Admitting: Advanced Practice Midwife

## 2024-04-06 ENCOUNTER — Other Ambulatory Visit: Payer: Self-pay

## 2024-04-06 ENCOUNTER — Other Ambulatory Visit (HOSPITAL_COMMUNITY): Payer: Self-pay

## 2024-04-06 ENCOUNTER — Other Ambulatory Visit: Payer: Self-pay | Admitting: Family Medicine

## 2024-04-06 MED ORDER — SERTRALINE HCL 50 MG PO TABS
50.0000 mg | ORAL_TABLET | Freq: Every day | ORAL | 0 refills | Status: DC
  Filled 2024-04-06: qty 90, 90d supply, fill #0

## 2024-04-13 ENCOUNTER — Other Ambulatory Visit (HOSPITAL_COMMUNITY): Payer: Self-pay

## 2024-04-13 ENCOUNTER — Other Ambulatory Visit: Payer: Self-pay

## 2024-04-13 ENCOUNTER — Other Ambulatory Visit: Payer: Self-pay | Admitting: Cardiovascular Disease

## 2024-04-13 DIAGNOSIS — I480 Paroxysmal atrial fibrillation: Secondary | ICD-10-CM

## 2024-04-13 MED ORDER — WARFARIN SODIUM 5 MG PO TABS
ORAL_TABLET | ORAL | 0 refills | Status: DC
  Filled 2024-04-13: qty 100, 90d supply, fill #0

## 2024-04-13 NOTE — Telephone Encounter (Signed)
 Prescription refill request received for warfarin Lov: 05/21/23 Mazie)   Next INR check: 05/06/24 Warfarin tablet strength: 5mg   Appropriate dose. Refill sent.

## 2024-05-06 ENCOUNTER — Ambulatory Visit: Attending: Cardiovascular Disease | Admitting: *Deleted

## 2024-05-06 DIAGNOSIS — H6123 Impacted cerumen, bilateral: Secondary | ICD-10-CM | POA: Diagnosis not present

## 2024-05-06 DIAGNOSIS — Z5181 Encounter for therapeutic drug level monitoring: Secondary | ICD-10-CM

## 2024-05-06 DIAGNOSIS — I480 Paroxysmal atrial fibrillation: Secondary | ICD-10-CM | POA: Diagnosis not present

## 2024-05-06 LAB — POCT INR: INR: 2.1 (ref 2.0–3.0)

## 2024-05-06 NOTE — Patient Instructions (Signed)
 Description   Today take 1.5 tablets of warfarin then continue taking warfarin 5mg  daily except 2.5mg  on Mondays.  Recheck INR in 6 weeks.   Coumadin  Clinic [phone]

## 2024-05-06 NOTE — Progress Notes (Signed)
 INR-2.1; Today take 1.5 tablets of warfarin then continue taking warfarin 5mg  daily except 2.5mg  on Mondays.  Recheck INR in 6 weeks.   Coumadin  Clinic [phone]

## 2024-05-12 ENCOUNTER — Other Ambulatory Visit (HOSPITAL_COMMUNITY): Payer: Self-pay

## 2024-05-12 ENCOUNTER — Other Ambulatory Visit: Payer: Self-pay | Admitting: Family Medicine

## 2024-05-12 MED ORDER — ALPRAZOLAM 0.25 MG PO TABS
0.2500 mg | ORAL_TABLET | Freq: Every evening | ORAL | 5 refills | Status: AC | PRN
  Filled 2024-05-12: qty 30, 30d supply, fill #0
  Filled 2024-06-14: qty 30, 30d supply, fill #1
  Filled 2024-07-12: qty 30, 30d supply, fill #2
  Filled 2024-08-03 – 2024-08-16 (×2): qty 30, 30d supply, fill #3
  Filled 2024-08-23 – 2024-09-16 (×3): qty 30, 30d supply, fill #4
  Filled 2024-09-28 – 2024-10-18 (×3): qty 30, 30d supply, fill #5

## 2024-06-01 ENCOUNTER — Other Ambulatory Visit (HOSPITAL_COMMUNITY): Payer: Self-pay

## 2024-06-01 ENCOUNTER — Other Ambulatory Visit (HOSPITAL_BASED_OUTPATIENT_CLINIC_OR_DEPARTMENT_OTHER): Payer: Self-pay | Admitting: Cardiovascular Disease

## 2024-06-01 DIAGNOSIS — E7800 Pure hypercholesterolemia, unspecified: Secondary | ICD-10-CM

## 2024-06-01 MED ORDER — EZETIMIBE 10 MG PO TABS
10.0000 mg | ORAL_TABLET | Freq: Every day | ORAL | 0 refills | Status: DC
  Filled 2024-06-01: qty 90, 90d supply, fill #0

## 2024-06-01 MED ORDER — ROSUVASTATIN CALCIUM 40 MG PO TABS
40.0000 mg | ORAL_TABLET | Freq: Every day | ORAL | 0 refills | Status: DC
  Filled 2024-06-01: qty 90, 90d supply, fill #0

## 2024-06-05 ENCOUNTER — Ambulatory Visit (INDEPENDENT_AMBULATORY_CARE_PROVIDER_SITE_OTHER): Admitting: Family Medicine

## 2024-06-05 ENCOUNTER — Encounter: Payer: Self-pay | Admitting: Family Medicine

## 2024-06-05 ENCOUNTER — Ambulatory Visit: Payer: Self-pay | Admitting: Family Medicine

## 2024-06-05 ENCOUNTER — Ambulatory Visit
Admission: RE | Admit: 2024-06-05 | Discharge: 2024-06-05 | Disposition: A | Discharge status: Home / Self Care | Source: Ambulatory Visit | Attending: Family Medicine | Admitting: Family Medicine

## 2024-06-05 VITALS — BP 116/64 | HR 61 | Temp 98.1°F | Resp 18 | Wt 201.4 lb

## 2024-06-05 DIAGNOSIS — S0093XA Contusion of unspecified part of head, initial encounter: Secondary | ICD-10-CM

## 2024-06-05 DIAGNOSIS — S0990XA Unspecified injury of head, initial encounter: Secondary | ICD-10-CM | POA: Diagnosis not present

## 2024-06-05 DIAGNOSIS — R519 Headache, unspecified: Secondary | ICD-10-CM | POA: Diagnosis not present

## 2024-06-05 DIAGNOSIS — J40 Bronchitis, not specified as acute or chronic: Secondary | ICD-10-CM | POA: Diagnosis not present

## 2024-06-05 DIAGNOSIS — G44319 Acute post-traumatic headache, not intractable: Secondary | ICD-10-CM

## 2024-06-05 DIAGNOSIS — S060X0A Concussion without loss of consciousness, initial encounter: Secondary | ICD-10-CM | POA: Diagnosis not present

## 2024-06-05 MED ORDER — AMOXICILLIN-POT CLAVULANATE 875-125 MG PO TABS: 1.0000 | ORAL_TABLET | Freq: Two times a day (BID) | ORAL | 0 refills | Status: DC

## 2024-06-05 NOTE — Progress Notes (Signed)
   Subjective:    Patient ID: Francis Campbell., male    DOB: 04/09/1946, 78 y.o.   MRN: [id number]  HPI Here for 2 issues. First about 10 days ago he struck his head twice. Each time he was walking into a storage shed in his yard when he hit the top of his head on the door frame. The first time was hard enough to knock him down. The second time a few minutes later was not so hard. There was no LOC. Since then he has developed a mild headache over the left temple (rated 2 out of 10), and he has had slightly blurred vision off and on. No nausea or light sensitivity. He does take Warfarin. The other issus began 4 days ago with stuffy head, PND, chest tightness and a dry cough. No fever. Taking Mucinex .    Review of Systems  Constitutional: Negative.   HENT:  Positive for congestion and postnasal drip. Negative for ear pain, sinus pain and sore throat.   Eyes:  Positive for visual disturbance.  Respiratory:  Positive for cough. Negative for shortness of breath and wheezing.   Cardiovascular: Negative.   Gastrointestinal: Negative.   Neurological:  Positive for headaches. Negative for dizziness, tremors, seizures, syncope, facial asymmetry, speech difficulty, weakness, light-headedness and numbness.       Objective:   Physical Exam Constitutional:      General: He is not in acute distress.    Appearance: Normal appearance. He is well-developed.  HENT:     Head: Normocephalic and atraumatic.     Right Ear: Tympanic membrane, ear canal and external ear normal.     Left Ear: Tympanic membrane, ear canal and external ear normal.     Nose: Nose normal.     Mouth/Throat:     Pharynx: Oropharynx is clear.  Eyes:     Extraocular Movements: Extraocular movements intact.     Conjunctiva/sclera: Conjunctivae normal.     Pupils: Pupils are equal, round, and reactive to light.  Cardiovascular:     Rate and Rhythm: Normal rate. Rhythm irregular.     Pulses: Normal pulses.     Heart sounds: Normal  heart sounds.  Pulmonary:     Effort: Pulmonary effort is normal.     Breath sounds: Rhonchi present. No wheezing or rales.  Lymphadenopathy:     Cervical: No cervical adenopathy.  Neurological:     General: No focal deficit present.     Mental Status: He is alert and oriented to person, place, and time.     Cranial Nerves: No cranial nerve deficit.     Coordination: Coordination normal.     Gait: Gait normal.           Assessment & Plan:  He had recent head trauma, now with headache and blurred vision. This is consistent with a mild concussion. We will get a head CT today to rule out any intracranial bleeds. He also has an early bronchitis, so we will treat this with 10 days of Augmentin .  Garnette Olmsted, MD

## 2024-06-11 DIAGNOSIS — M7541 Impingement syndrome of right shoulder: Secondary | ICD-10-CM | POA: Diagnosis not present

## 2024-06-14 ENCOUNTER — Other Ambulatory Visit: Payer: Self-pay | Admitting: Cardiovascular Disease

## 2024-06-14 ENCOUNTER — Other Ambulatory Visit (HOSPITAL_COMMUNITY): Payer: Self-pay

## 2024-06-15 ENCOUNTER — Other Ambulatory Visit: Payer: Self-pay

## 2024-06-15 MED ORDER — DILTIAZEM HCL ER COATED BEADS 240 MG PO CP24
240.0000 mg | ORAL_CAPSULE | Freq: Every morning | ORAL | 3 refills | Status: AC
  Filled 2024-06-15: qty 90, 90d supply, fill #0
  Filled 2024-09-13: qty 90, 90d supply, fill #1

## 2024-06-16 ENCOUNTER — Ambulatory Visit: Attending: Cardiovascular Disease | Admitting: *Deleted

## 2024-06-16 DIAGNOSIS — Z5181 Encounter for therapeutic drug level monitoring: Secondary | ICD-10-CM | POA: Diagnosis not present

## 2024-06-16 DIAGNOSIS — I480 Paroxysmal atrial fibrillation: Secondary | ICD-10-CM

## 2024-06-16 LAB — POCT INR: INR: 2.2 (ref 2.0–3.0)

## 2024-06-16 NOTE — Patient Instructions (Signed)
 Description   INR-2.2; Continue taking warfarin 5mg  daily except 2.5mg  on Mondays.  Recheck INR in 6 weeks.   Coumadin  Clinic [phone]

## 2024-06-16 NOTE — Progress Notes (Signed)
 Description   INR-2.2; Continue taking warfarin 5mg  daily except 2.5mg  on Mondays.  Recheck INR in 6 weeks.   Coumadin  Clinic [phone]

## 2024-06-28 ENCOUNTER — Other Ambulatory Visit: Payer: Self-pay | Admitting: Family Medicine

## 2024-06-29 ENCOUNTER — Other Ambulatory Visit: Payer: Self-pay

## 2024-06-29 ENCOUNTER — Other Ambulatory Visit (HOSPITAL_COMMUNITY): Payer: Self-pay

## 2024-06-29 MED ORDER — SERTRALINE HCL 50 MG PO TABS
50.0000 mg | ORAL_TABLET | Freq: Every day | ORAL | 0 refills | Status: DC
  Filled 2024-06-29 – 2024-07-06 (×3): qty 90, 90d supply, fill #0

## 2024-07-06 ENCOUNTER — Other Ambulatory Visit: Payer: Self-pay

## 2024-07-06 ENCOUNTER — Other Ambulatory Visit (HOSPITAL_COMMUNITY): Payer: Self-pay

## 2024-07-07 ENCOUNTER — Encounter (HOSPITAL_BASED_OUTPATIENT_CLINIC_OR_DEPARTMENT_OTHER): Payer: Self-pay | Admitting: Cardiovascular Disease

## 2024-07-07 DIAGNOSIS — I10 Essential (primary) hypertension: Secondary | ICD-10-CM

## 2024-07-07 DIAGNOSIS — Z5181 Encounter for therapeutic drug level monitoring: Secondary | ICD-10-CM

## 2024-07-07 DIAGNOSIS — E7800 Pure hypercholesterolemia, unspecified: Secondary | ICD-10-CM

## 2024-07-13 ENCOUNTER — Other Ambulatory Visit (HOSPITAL_COMMUNITY): Payer: Self-pay

## 2024-07-13 ENCOUNTER — Other Ambulatory Visit: Payer: Self-pay

## 2024-07-15 ENCOUNTER — Encounter (HOSPITAL_BASED_OUTPATIENT_CLINIC_OR_DEPARTMENT_OTHER): Payer: Self-pay | Admitting: Cardiovascular Disease

## 2024-07-15 DIAGNOSIS — E7800 Pure hypercholesterolemia, unspecified: Secondary | ICD-10-CM | POA: Diagnosis not present

## 2024-07-15 DIAGNOSIS — Z5181 Encounter for therapeutic drug level monitoring: Secondary | ICD-10-CM | POA: Diagnosis not present

## 2024-07-15 DIAGNOSIS — I10 Essential (primary) hypertension: Secondary | ICD-10-CM | POA: Diagnosis not present

## 2024-07-16 LAB — COMPREHENSIVE METABOLIC PANEL WITH GFR
ALT: 100 IU/L — ABNORMAL HIGH (ref 0–44)
AST: 46 IU/L — ABNORMAL HIGH (ref 0–40)
Albumin: 4.3 g/dL (ref 3.8–4.8)
Alkaline Phosphatase: 100 IU/L (ref 47–123)
BUN/Creatinine Ratio: 20 (ref 10–24)
BUN: 18 mg/dL (ref 8–27)
Bilirubin Total: 0.5 mg/dL (ref 0.0–1.2)
CO2: 24 mmol/L (ref 20–29)
Calcium: 9.4 mg/dL (ref 8.6–10.2)
Chloride: 102 mmol/L (ref 96–106)
Creatinine, Ser: 0.9 mg/dL (ref 0.76–1.27)
Globulin, Total: 2.4 g/dL (ref 1.5–4.5)
Glucose: 80 mg/dL (ref 70–99)
Potassium: 4.4 mmol/L (ref 3.5–5.2)
Sodium: 141 mmol/L (ref 134–144)
Total Protein: 6.7 g/dL (ref 6.0–8.5)
eGFR: 88 mL/min/1.73 (ref >59)

## 2024-07-16 LAB — LIPID PANEL
Chol/HDL Ratio: 2.6 ratio (ref 0.0–5.0)
Cholesterol, Total: 111 mg/dL (ref 100–199)
HDL: 43 mg/dL (ref >39)
LDL Chol Calc (NIH): 36 mg/dL (ref 0–99)
Triglycerides: 205 mg/dL — ABNORMAL HIGH (ref 0–149)
VLDL Cholesterol Cal: 32 mg/dL (ref 5–40)

## 2024-07-20 ENCOUNTER — Encounter (HOSPITAL_BASED_OUTPATIENT_CLINIC_OR_DEPARTMENT_OTHER): Payer: Self-pay | Admitting: Cardiovascular Disease

## 2024-07-20 ENCOUNTER — Other Ambulatory Visit (HOSPITAL_COMMUNITY): Payer: Self-pay

## 2024-07-20 ENCOUNTER — Other Ambulatory Visit: Payer: Self-pay

## 2024-07-20 ENCOUNTER — Ambulatory Visit (HOSPITAL_BASED_OUTPATIENT_CLINIC_OR_DEPARTMENT_OTHER): Admitting: Cardiovascular Disease

## 2024-07-20 VITALS — BP 134/68 | HR 58 | Ht 73.0 in | Wt 197.2 lb

## 2024-07-20 DIAGNOSIS — E7800 Pure hypercholesterolemia, unspecified: Secondary | ICD-10-CM

## 2024-07-20 DIAGNOSIS — I10 Essential (primary) hypertension: Secondary | ICD-10-CM | POA: Diagnosis not present

## 2024-07-20 DIAGNOSIS — I4892 Unspecified atrial flutter: Secondary | ICD-10-CM | POA: Diagnosis not present

## 2024-07-20 DIAGNOSIS — F419 Anxiety disorder, unspecified: Secondary | ICD-10-CM

## 2024-07-20 DIAGNOSIS — I7121 Aneurysm of the ascending aorta, without rupture: Secondary | ICD-10-CM | POA: Diagnosis not present

## 2024-07-20 DIAGNOSIS — I480 Paroxysmal atrial fibrillation: Secondary | ICD-10-CM

## 2024-07-20 MED ORDER — LOSARTAN POTASSIUM 50 MG PO TABS
50.0000 mg | ORAL_TABLET | Freq: Every day | ORAL | 3 refills | Status: AC
  Filled 2024-07-20: qty 90, 90d supply, fill #0

## 2024-07-20 NOTE — Progress Notes (Addendum)
 Cardiology Office Note:  .   Date:  07/20/2024  ID:  Francis Campbell., DOB [birth year], MRN [id number] PCP: Micheal Wolm ORN, MD  [facility] Providers Cardiologist:  Annabella Scarce, MD    History of Present Illness: Francis    Cadon Campbell. is a 78 y.o. male with paroxysmal atrial fibrillation on coumadin , hypertension, hyperlipidemia, mild ascending aortic aneurysm, and Crohn's disease, who presents for follow up.   Francis Campbell was evaluated by Dr. Kelsie for catheter ablation in 2014 and elected to use flecainide  as a pill in the pocket.  He underwent DCCV 04/2013.  He had a negative Myoview  09/2011 and an echo 10/2011 that revealed LVEF 60% with mild AR.  He noted increasing episodes of atrial fibrillation and started on daily flecainide  04/2016.  He developed bradycardia, so metoprolol  was reduced.    Francis Campbell was seen in the ED 08/2017 with intermittent chest tightness.  His symptoms were felt to be atypical and EKG was unremarkable.  Troponin and chest x-ray were within normal limits.  He followed up in clinic and was referred for an exercise Myoview  09/2017 that was negative for ischemia.  Repeat echo 05/2022 revealed LVEF 65-70% with grade 1 diastolic dysfunction.  Ascending aorta was 4.3 cm, increased from 4.0 cm in 2019.  He saw Reche Finder, NP 11/2022 and reported a brief episode of atrial fibrillation that responded to flecainide .  At his visit 05/2023 he was doing well and was recovering from an eye surgery.  Repeat echo 02/2024 revealed LVEF 50-55% and his aorta was 4.3 cm.  LVEF was 60-65% in 2024.    Discussed the use of AI scribe software for clinical note transcription with the patient, who gave verbal consent to proceed.  History of Present Illness Francis Campbell has been experiencing fluctuating blood pressure readings at home, ranging from 115/60 to 159/90, measured multiple times a day with a rest period of at least five minutes before each measurement. He reports that his  blood pressure readings at home were lower in July when he was not working, and he has been back at work since March. His current medications include losartan , diltiazem , metoprolol , and flecainide  for blood pressure and atrial fibrillation management.  He denies any recent episodes of atrial fibrillation. He is also on atorvastatin and warfarin.   His aortic measurements have been stable, with the most recent echocardiogram showing an aorta size of 4.3 cm, down from 4.4 cm previously. His ejection fraction was noted to be between 50 and 55%.  He works at [employer].  He has a sedentary lifestyle and hasn't been exercising.  His son, [name], resides in [place].   ROS:  As per HPI  Studies Reviewed: Francis   EKG Interpretation Date/Time:  Monday July 20 2024 10:15:28 EST Ventricular Rate:  58 PR Interval:  192 QRS Duration:  92 QT Interval:  450 QTC Calculation: 441 R Axis:   23  Text Interpretation: Sinus bradycardia Incomplete right bundle branch block When compared with ECG of 12-Sep-2017 14:11, No significant change was found Confirmed by Scarce Annabella ([id number]) on 07/20/2024 10:31:11 AM   Echo 02/2024:    1. Mild concetric hypertrophy with moderate hypertrophy of the basal  septum. Left ventricular ejection fraction, by estimation, is 50 to 55%.  Left ventricular ejection fraction by 3D volume is 54 %. The left  ventricle has low normal function. The left  ventricle has no regional wall motion abnormalities. There is mild  concentric left ventricular hypertrophy. Left ventricular diastolic  parameters are consistent with Grade I diastolic dysfunction (impaired  relaxation). The average left ventricular global   longitudinal strain is -21.9 %. The global longitudinal strain is normal.   2. Right ventricular systolic function is normal. The right ventricular  size is normal. There is normal pulmonary artery systolic pressure.   3. The mitral valve is normal in  structure. No evidence of mitral valve  regurgitation. No evidence of mitral stenosis.   4. Non-coronary cusp is enlarged. The aortic valve is tricuspid. Aortic  valve regurgitation is mild to moderate. No aortic stenosis is present.   5. Aortic dilatation noted. There is mild dilatation of the aortic root,  measuring 43 mm.   6. The inferior vena cava is normal in size with greater than 50%  respiratory variability, suggesting right atrial pressure of 3 mmHg.   Echo 05/2023:  1. Left ventricular ejection fraction, by estimation, is 60 to 65%. The  left ventricle has normal function. The left ventricle has no regional  wall motion abnormalities. There is moderate asymmetric left ventricular  hypertrophy of the basal and septal  segments. Left ventricular diastolic parameters were normal. The average  left ventricular global longitudinal strain is -20.6 %. The global  longitudinal strain is normal.   2. Right ventricular systolic function is normal. The right ventricular  size is normal.   3. Left atrial size was moderately dilated.   4. Right atrial size was mildly dilated.   5. The mitral valve is abnormal. Trivial mitral valve regurgitation. No  evidence of mitral stenosis.   6. Partial fusion of right/left cusps . The aortic valve is tricuspid.  There is mild calcification of the aortic valve. There is mild thickening  of the aortic valve. Aortic valve regurgitation is mild to moderate.  Aortic valve sclerosis is present, with   no evidence of aortic valve stenosis.   7. Aortic dilatation noted. There is moderate dilatation of the aortic  root, measuring 44 mm.   8. The inferior vena cava is normal in size with greater than 50%  respiratory variability, suggesting right atrial pressure of 3 mmHg.    Risk Assessment/Calculations:    CHA2DS2-VASc Score =     This indicates a  % annual risk of stroke. The patient's score is based upon:             Physical Exam:   VS:  BP  134/68   Pulse (!) 58   Ht 6' 1 (1.854 m)   Wt 197 lb 3.2 oz (89.4 kg)   SpO2 96%   BMI 26.02 kg/m  , BMI Body mass index is 26.02 kg/m. GENERAL:  Well appearing HEENT: Pupils equal round and reactive, fundi not visualized, oral mucosa unremarkable NECK:  No jugular venous distention, waveform within normal limits, carotid upstroke brisk and symmetric, no bruits, no thyromegaly LUNGS:  Clear to auscultation bilaterally HEART:  RRR.  PMI not displaced or sustained,S1 and S2 within normal limits, no S3, no S4, no clicks, no rubs, no murmurs ABD:  Flat, positive bowel sounds normal in frequency in pitch, no bruits, no rebound, no guarding, no midline pulsatile mass, no hepatomegaly, no splenomegaly EXT:  2 plus pulses throughout, no edema, no cyanosis no clubbing SKIN:  No rashes no nodules NEURO:  Cranial nerves II through XII grossly intact, motor grossly intact throughout PSYCH:  Cognitively intact, oriented to person place and time   ASSESSMENT AND PLAN: .  Assessment & Plan # Paroxysmal atrial fibrillation No recent episodes. Managed with metoprolol , flecainide , and warfarin. INR stable. Prefers Coumadin . - Continue metoprolol , flecainide , and warfarin. - Discussed switching to DOAC.  He is happy to remain on warfarin.  - Maintain INR monitoring.  # Ascending aortic aneurysm Aneurysm stable at 4.3 cm. Ejection fraction 50-55%. Contrast echocardiography planned for better assessment. - Repeat echocardiogram in June with contrast to assess ejection fraction and aneurysm stability.  # Primary hypertension Home readings elevated; in-office stable. Concern about home monitor accuracy. Prefer increasing losartan . - Increase losartan  to 50 mg daily from 25 mg. - Bring home blood pressure monitor to next appointment to verify accuracy. - Follow up with pharmacist Kristen in two months to review blood pressure readings and monitor accuracy. - Encourage 30 minutes of continuous  exercise daily.  # Hypercholesterolemia - Continue azustatin.       Dispo: f/u with PharmD in 2 months.  F/u with me 02/2025 after echo.   Signed, Annabella Scarce, MD

## 2024-07-20 NOTE — Patient Instructions (Addendum)
 Medication Instructions:  INCREASE LOSARTAN  TO 50 MG DAILY   *If you need a refill on your cardiac medications before your next appointment, please call your pharmacy*  Lab Work: BMET IN 1 WEEK   If you have labs (blood work) drawn today and your tests are completely normal, you will receive your results only by: MyChart Message (if you have MyChart) OR A paper copy in the mail If you have any lab test that is abnormal or we need to change your treatment, we will call you to review the results.  Testing/Procedures: Your physician has requested that you have an echocardiogram. Echocardiography is a painless test that uses sound waves to create images of your heart. It provides your doctor with information about the size and shape of your heart and how well your heart's chambers and valves are working. This procedure takes approximately one hour. There are no restrictions for this procedure. Please do NOT wear cologne, perfume, aftershave, or lotions (deodorant is allowed). Please arrive 15 minutes prior to your appointment time.  Please note: We ask at that you not bring children with you during ultrasound (echo/ vascular) testing. Due to room size and safety concerns, children are not allowed in the ultrasound rooms during exams. Our front office staff cannot provide observation of children in our lobby area while testing is being conducted. An adult accompanying a patient to their appointment will only be allowed in the ultrasound room at the discretion of the ultrasound technician under special circumstances. We apologize for any inconvenience.  TO BE DONE IN JUNE   Follow-Up: At [facility], you and your health needs are our priority.  As part of our continuing mission to provide you with exceptional heart care, our providers are all part of one team.  This team includes your primary Cardiologist (physician) and Advanced Practice Providers or APPs (Physician Assistants and Nurse  Practitioners) who all work together to provide you with the care you need, when you need it.  Your next appointment:   AFTER ECHO IN JUNE   Provider:   Annabella Scarce, MD, Rosaline Bane, NP, or Reche Finder, NP   PHARM D 2 MONTHS   We recommend signing up for the patient portal called MyChart.  Sign up information is provided on this After Visit Summary.  MyChart is used to connect with patients for Virtual Visits (Telemedicine).  Patients are able to view lab/test results, encounter notes, upcoming appointments, etc.  Non-urgent messages can be sent to your provider as well.   To learn more about what you can do with MyChart, go to [email or web address].

## 2024-07-21 ENCOUNTER — Encounter (HOSPITAL_BASED_OUTPATIENT_CLINIC_OR_DEPARTMENT_OTHER): Payer: Self-pay | Admitting: Cardiovascular Disease

## 2024-07-27 DIAGNOSIS — I10 Essential (primary) hypertension: Secondary | ICD-10-CM | POA: Diagnosis not present

## 2024-07-27 DIAGNOSIS — I480 Paroxysmal atrial fibrillation: Secondary | ICD-10-CM | POA: Diagnosis not present

## 2024-07-27 NOTE — Telephone Encounter (Signed)
 Office visit 11/3/235 with Dr. Sallie:   History of Present Illness Mr. Dockendorf has been experiencing fluctuating blood pressure readings at home, ranging from 115/60 to 159/90, measured multiple times a day with a rest period of at least five minutes before each measurement. He reports that his blood pressure readings at home were lower in July when he was not working, and he has been back at work since March. His current medications include losartan , diltiazem , metoprolol , and flecainide  for blood pressure and atrial fibrillation management.   He denies any recent episodes of atrial fibrillation. He is also on atorvastatin and warfarin. He has a history of low blood pressure and blacking out at work when on a low-salt diet.

## 2024-07-28 ENCOUNTER — Ambulatory Visit: Attending: Cardiovascular Disease | Admitting: *Deleted

## 2024-07-28 DIAGNOSIS — Z5181 Encounter for therapeutic drug level monitoring: Secondary | ICD-10-CM

## 2024-07-28 DIAGNOSIS — I480 Paroxysmal atrial fibrillation: Secondary | ICD-10-CM | POA: Diagnosis not present

## 2024-07-28 LAB — BASIC METABOLIC PANEL WITH GFR
BUN/Creatinine Ratio: 25 — ABNORMAL HIGH (ref 10–24)
BUN: 27 mg/dL (ref 8–27)
CO2: 23 mmol/L (ref 20–29)
Calcium: 9.8 mg/dL (ref 8.6–10.2)
Chloride: 103 mmol/L (ref 96–106)
Creatinine, Ser: 1.1 mg/dL (ref 0.76–1.27)
Glucose: 132 mg/dL — ABNORMAL HIGH (ref 70–99)
Potassium: 5.1 mmol/L (ref 3.5–5.2)
Sodium: 139 mmol/L (ref 134–144)
eGFR: 69 mL/min/1.73 (ref >59)

## 2024-07-28 LAB — POCT INR: INR: 2.5 (ref 2.0–3.0)

## 2024-07-28 NOTE — Patient Instructions (Signed)
 Description   INR-2.5; Continue taking warfarin 5mg  daily except 2.5mg  on Mondays.  Recheck INR in 7 weeks.   Coumadin  Clinic [phone]

## 2024-07-28 NOTE — Progress Notes (Signed)
 Description   INR-2.5; Continue taking warfarin 5mg  daily except 2.5mg  on Mondays.  Recheck INR in 7 weeks.   Coumadin  Clinic [phone]

## 2024-08-01 ENCOUNTER — Encounter (HOSPITAL_BASED_OUTPATIENT_CLINIC_OR_DEPARTMENT_OTHER): Payer: Self-pay | Admitting: Cardiovascular Disease

## 2024-08-03 ENCOUNTER — Other Ambulatory Visit (HOSPITAL_BASED_OUTPATIENT_CLINIC_OR_DEPARTMENT_OTHER): Payer: Self-pay | Admitting: Cardiovascular Disease

## 2024-08-03 ENCOUNTER — Other Ambulatory Visit: Payer: Self-pay | Admitting: Cardiovascular Disease

## 2024-08-03 ENCOUNTER — Other Ambulatory Visit (HOSPITAL_COMMUNITY): Payer: Self-pay

## 2024-08-03 DIAGNOSIS — I10 Essential (primary) hypertension: Secondary | ICD-10-CM

## 2024-08-03 DIAGNOSIS — I480 Paroxysmal atrial fibrillation: Secondary | ICD-10-CM

## 2024-08-03 MED ORDER — WARFARIN SODIUM 5 MG PO TABS
ORAL_TABLET | ORAL | 1 refills | Status: AC
  Filled 2024-08-03: qty 90, 90d supply, fill #0

## 2024-08-04 ENCOUNTER — Other Ambulatory Visit (HOSPITAL_COMMUNITY): Payer: Self-pay

## 2024-08-04 ENCOUNTER — Other Ambulatory Visit: Payer: Self-pay

## 2024-08-04 ENCOUNTER — Ambulatory Visit: Payer: Self-pay | Admitting: Cardiovascular Disease

## 2024-08-04 DIAGNOSIS — M19211 Secondary osteoarthritis, right shoulder: Secondary | ICD-10-CM | POA: Diagnosis not present

## 2024-08-04 DIAGNOSIS — M7541 Impingement syndrome of right shoulder: Secondary | ICD-10-CM | POA: Diagnosis not present

## 2024-08-04 DIAGNOSIS — M75111 Incomplete rotator cuff tear or rupture of right shoulder, not specified as traumatic: Secondary | ICD-10-CM | POA: Diagnosis not present

## 2024-08-04 MED ORDER — METOPROLOL SUCCINATE ER 25 MG PO TB24
25.0000 mg | ORAL_TABLET | Freq: Every day | ORAL | 3 refills | Status: AC
  Filled 2024-08-04: qty 90, 90d supply, fill #0

## 2024-08-05 ENCOUNTER — Telehealth (HOSPITAL_BASED_OUTPATIENT_CLINIC_OR_DEPARTMENT_OTHER): Payer: Self-pay

## 2024-08-05 ENCOUNTER — Encounter (HOSPITAL_BASED_OUTPATIENT_CLINIC_OR_DEPARTMENT_OTHER): Payer: Self-pay | Admitting: Cardiovascular Disease

## 2024-08-05 NOTE — Telephone Encounter (Signed)
   Pre-operative Risk Assessment    Patient Name: Francis Campbell.  DOB: Feb 01, 1946 MRN: [id number]   Date of last office visit: 07/20/24 with Dr. Raford Date of next office visit: NA   Request for Surgical Clearance    Procedure:  Total Shoulder Arthroplasty   Date of Surgery:  Clearance TBD                                 Surgeon:  Dr. Kay Socks Group or Practice Name:  [facility] Phone number:  [phone] Fax number:  [phone]   Type of Clearance Requested:   - Medical    Type of Anesthesia:  choice   Additional requests/questions:    Bonney Augustin JONETTA Delores   08/05/2024, 11:28 AM

## 2024-08-05 NOTE — Telephone Encounter (Signed)
 Spoke to [facility] and they will call me back to let me know if the patient needs to hold Coumadin  or not.

## 2024-08-05 NOTE — Telephone Encounter (Signed)
 Patient is on coumadin .  Please confirm with Dr.Norris' office about holding or not. Thank you.  KL

## 2024-08-05 NOTE — Telephone Encounter (Signed)
 Spoke to [place] at [place] if Mr. Boggus needs Coumadin , then he can go on Lovenox  for his hospital duration then back to Coumadin  when he's home again. But if he can go without it, 5 days before the operation it should be held.

## 2024-08-06 DIAGNOSIS — H6123 Impacted cerumen, bilateral: Secondary | ICD-10-CM | POA: Diagnosis not present

## 2024-08-07 NOTE — Telephone Encounter (Signed)
 Pharmacy please advise on holding coumadin  prior to total shoulder arthoplasty scheduled for TBD. Last labs BMET 07/27/2024, and INR 07/28/2024.   Per Asberry Dunning 08/05/2024 Spoke to [place] at [facility] if Francis Campbell needs Coumadin , then he can go on Lovenox  for his hospital duration then back to Coumadin  when he's home again. But if he can go without it, 5 days before the operation it should be held.

## 2024-08-11 NOTE — Telephone Encounter (Signed)
 Patient with diagnosis of afib on warfarin for anticoagulation.    Procedure: Total Shoulder Arthroplasty  Date of procedure: TBD   CHA2DS2-VASc Score = 4   This indicates a 4.8% annual risk of stroke. The patient's score is based upon: CHF History: 0 HTN History: 1 Diabetes History: 0 Stroke History: 0 Vascular Disease History: 1 Age Score: 2 Gender Score: 0      CrCl 71 ml/min  Patient has not had an Afib/aflutter ablation in the last 3 months, DCCV within the last 4 weeks or a watchman implanted in the last 45 days   Per office protocol, patient can hold warfarin for 5 days prior to procedure.    Patient will not need bridging with Lovenox  (enoxaparin ) around procedure.  **This guidance is not considered finalized until pre-operative APP has relayed final recommendations.**

## 2024-08-12 DIAGNOSIS — M545 Low back pain, unspecified: Secondary | ICD-10-CM | POA: Diagnosis not present

## 2024-08-12 DIAGNOSIS — N200 Calculus of kidney: Secondary | ICD-10-CM | POA: Diagnosis not present

## 2024-08-12 DIAGNOSIS — R300 Dysuria: Secondary | ICD-10-CM | POA: Diagnosis not present

## 2024-08-12 DIAGNOSIS — R82991 Hypocitraturia: Secondary | ICD-10-CM | POA: Diagnosis not present

## 2024-08-16 ENCOUNTER — Other Ambulatory Visit (HOSPITAL_COMMUNITY): Payer: Self-pay

## 2024-08-17 ENCOUNTER — Other Ambulatory Visit: Payer: Self-pay

## 2024-08-17 NOTE — Telephone Encounter (Signed)
 Dr. Raford  You saw this patient on 07/20/2024. Per protocol we request that you comment on his cardiac risk to proceed with Total Shoulder Arthroplasty on TBD since it has been less than 2 months since evaluated in the office. Please send your comment to P CV Pre-Op Pool.  Thank you, Lamarr Satterfield DNP, ANP, AACC.

## 2024-08-21 NOTE — Telephone Encounter (Signed)
 Addressed via lab result.   Alver Sorrow, NP

## 2024-08-23 ENCOUNTER — Other Ambulatory Visit (HOSPITAL_BASED_OUTPATIENT_CLINIC_OR_DEPARTMENT_OTHER): Payer: Self-pay | Admitting: Cardiovascular Disease

## 2024-08-23 DIAGNOSIS — E7800 Pure hypercholesterolemia, unspecified: Secondary | ICD-10-CM

## 2024-08-24 ENCOUNTER — Encounter: Payer: Self-pay | Admitting: Family Medicine

## 2024-08-24 ENCOUNTER — Ambulatory Visit: Admitting: Family Medicine

## 2024-08-24 ENCOUNTER — Other Ambulatory Visit (HOSPITAL_COMMUNITY): Payer: Self-pay

## 2024-08-24 VITALS — BP 138/64 | HR 54 | Temp 97.6°F | Wt 198.7 lb

## 2024-08-24 DIAGNOSIS — I119 Hypertensive heart disease without heart failure: Secondary | ICD-10-CM

## 2024-08-24 DIAGNOSIS — I480 Paroxysmal atrial fibrillation: Secondary | ICD-10-CM

## 2024-08-24 DIAGNOSIS — Z01818 Encounter for other preprocedural examination: Secondary | ICD-10-CM

## 2024-08-24 NOTE — Progress Notes (Signed)
 Established Patient Office Visit  Subjective   Patient ID: Francis Campbell., male    DOB: [birth year]  Age: 78 y.o. MRN: [id number]  Chief Complaint  Patient presents with   Pre-op Exam    HPI   Francis Campbell is seen for preoperative consult for right shoulder arthroplasty.  He has history of A-fib/flutter, hypertension, chronic insomnia, recurrent depression, chronic Coumadin  therapy.SABRA  He states he usually can tell when he is in atrial fibrillation.  His middle flecainide  for years.  Takes high-dose rosuvastatin  for hyperlipidemia.  He has pending clearance from cardiologist.  No history of diabetes.  He has no history of heart failure, ischemic heart disease, cerebrovascular disease, diabetes, or chronic kidney disease.  There was mention of possible TIA 2005 but this was very speculative.  Most recent creatinine 1.1.  Denies any recent chest pains, fever, shortness of breath  Past Medical History:  Diagnosis Date   Allergy    Aortic regurgitation    Arthritis    Ascending aortic aneurysm 04/30/2022   Asthma    only as child   Atrial flutter (HCC)    a. Dx 09/2012.   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   TIA (transient ischemic attack)    ~2005 - had visual symptoms, was told by Dr. Dominick this may have been a TIA but it's unclear   Valvular heart disease    Mild AI, ASclerosis without AS by echo 10/2011   Past Surgical History:  Procedure Laterality Date   CARDIOVERSION N/A 05/08/2013   Procedure: CARDIOVERSION;  Surgeon: Debby Dominick, MD;  Location: [facility] ENDOSCOPY;  Service: Cardiovascular;  Laterality: N/A;   CATARACT EXTRACTION     Right 12/2019   CATARACT EXTRACTION     CYSTOSCOPY/RETROGRADE/URETEROSCOPY/STONE EXTRACTION WITH BASKET  Right 02/12/2013   Procedure: CYSTOSCOPY/RETROGRADE/URETEROSCOPY/STONE EXTRACTION WITH BASKET, WITH  STENT PLACEMENT RIGHT URETERAL DILATION;  Surgeon: Norleen JINNY Seltzer, MD;  Location: [facility]S;  Service: Urology;  Laterality: Right;   EYE SURGERY Bilateral 1950, 1960, 1964   left, right, left   HERNIA REPAIR  2005   KNEE SURGERY Bilateral 1990, 2000   LITHOTRIPSY     ROTATOR CUFF REPAIR Left 2000   SHOULDER SURGERY Right 04/24/2022   TONSILLECTOMY      reports that he has never smoked. He has never been exposed to tobacco smoke. He has never used smokeless tobacco. He reports that he does not drink alcohol and does not use drugs. family history includes Appendicitis in his son; Arthritis in his paternal grandmother; Heart disease in his father and paternal grandmother; Hypertension in his mother; Kidney cancer in his son; Kidney disease in his mother; Leukemia in his father; Nephrolithiasis in his brother and father; Stroke in his mother. Allergies  Allergen Reactions   Chocolate Palpitations   Chocolate Flavoring Agent (Non-Screening) Shortness Of Breath    Other reaction(s): Respiratory Distress   Clemastine     Other reaction(s): Irregular Heart Rate   Cocoa Extract (Fruit) Palpitations   Dextroamphetamine Shortness Of Breath    Other reaction(s): Respiratory Distress   Dextromethorphan Palpitations   Flomax [Tamsulosin Hcl] Other (See Comments)    ? Causes A Fib   Floxin [  Ofloxacin] Other (See Comments)    hallucinations   Lisinopril Cough   Other     Tavist causes irregular heart rate   Tamsulosin Other (See Comments)    Other reaction(s): Irregular Heart Rate  Other Reaction(s): other  tamsulosin   Tavist Allergy [Clemastine Fumarate] Other (See Comments)    A fib worse   Dm-Phenylephrine-Acetaminophen      Other reaction(s): Respiratory Distress  Other Reaction(s): other  acetaminophen  / dextromethorphan / phenylephrine   Amiodarone Other (See Comments)    Abn  thyroid     Dextromethorphan-Guaifenesin  Palpitations   Robitussin Cf [Pseudoephedrine-Dm-Gg] Palpitations    Review of Systems  Constitutional:  Negative for chills, fever and malaise/fatigue.  Eyes:  Negative for blurred vision.  Respiratory:  Negative for shortness of breath.   Cardiovascular:  Negative for chest pain.  Gastrointestinal:  Negative for abdominal pain.  Neurological:  Negative for dizziness, weakness and headaches.      Objective:     BP 138/64   Pulse (!) 54   Temp 97.6 F (36.4 C) (Oral)   Wt 198 lb 11.2 oz (90.1 kg)   SpO2 96%   BMI 26.22 kg/m  BP Readings from Last 3 Encounters:  08/24/24 138/64  07/20/24 134/68  06/05/24 116/64   Wt Readings from Last 3 Encounters:  08/24/24 198 lb 11.2 oz (90.1 kg)  07/20/24 197 lb 3.2 oz (89.4 kg)  06/05/24 201 lb 6.4 oz (91.4 kg)      Physical Exam Vitals reviewed.  Constitutional:      General: He is not in acute distress.    Appearance: He is well-developed.  Eyes:     Pupils: Pupils are equal, round, and reactive to light.  Neck:     Thyroid : No thyromegaly.     Comments: No carotid bruits Cardiovascular:     Rate and Rhythm: Normal rate.     Comments: Occasional premature beat auscultated. Pulmonary:     Effort: Pulmonary effort is normal. No respiratory distress.     Breath sounds: Normal breath sounds. No wheezing or rales.  Musculoskeletal:     Cervical back: Neck supple.     Right lower leg: No edema.     Left lower leg: No edema.  Neurological:     Mental Status: He is alert and oriented to person, place, and time.      No results found for any visits on 08/24/24.  Last CBC Lab Results  Component Value Date   WBC 9.4 12/10/2022   HGB 16.9 12/10/2022   HCT 51.8 (H) 12/10/2022   MCV 86 12/10/2022   MCH 28.1 12/10/2022   RDW 13.2 12/10/2022   PLT 215 12/10/2022   Last metabolic panel Lab Results  Component Value Date   GLUCOSE 132 (H) 07/27/2024   NA 139 07/27/2024   K 5.1  07/27/2024   CL 103 07/27/2024   CO2 23 07/27/2024   BUN 27 07/27/2024   CREATININE 1.10 07/27/2024   EGFR 69 07/27/2024   CALCIUM  9.8 07/27/2024   PROT 6.7 07/15/2024   ALBUMIN 4.3 07/15/2024   LABGLOB 2.4 07/15/2024   AGRATIO 2.0 05/02/2022   BILITOT 0.5 07/15/2024   ALKPHOS 100 07/15/2024   AST 46 (H) 07/15/2024   ALT 100 (H) 07/15/2024   ANIONGAP 7 11/21/2019   Last lipids Lab Results  Component Value Date   CHOL 111 07/15/2024   HDL 43 07/15/2024   LDLCALC 36 07/15/2024   LDLDIRECT 96.0 10/07/2017   TRIG 205 (H) 07/15/2024  CHOLHDL 2.6 07/15/2024   Last hemoglobin A1c No results found for: HGBA1C Last thyroid  functions Lab Results  Component Value Date   TSH 2.110 03/01/2022      The ASCVD Risk score (Arnett DK, et al., 2019) failed to calculate for the following reasons:   The valid total cholesterol range is 130 to 320 mg/dL    Assessment & Plan:   78 year old male for preoperative assessment.  He has chronic medical problems as above with history of A-fib/flutter, hypertension, hyperlipidemia.  Very vague history of possible TIA 2005 but not clear.  His revised cardiac risk index score assuming no history of TIA would be 0 which equates to 0.5% risk of major cardiac complication.  He had a recent EKG done.  He has follow-up with cardiologist for their clearance but no known medical contraindications at this time.  Patient is aware that he will need to hold Coumadin  for at least 5 days prior to surgery  Wolm Scarlet, MD

## 2024-08-27 ENCOUNTER — Other Ambulatory Visit: Payer: Self-pay

## 2024-08-27 ENCOUNTER — Other Ambulatory Visit (HOSPITAL_COMMUNITY): Payer: Self-pay

## 2024-08-27 MED ORDER — ROSUVASTATIN CALCIUM 40 MG PO TABS
40.0000 mg | ORAL_TABLET | Freq: Every day | ORAL | 3 refills | Status: AC
  Filled 2024-08-27 – 2024-08-29 (×2): qty 90, 90d supply, fill #0

## 2024-08-27 MED ORDER — EZETIMIBE 10 MG PO TABS
10.0000 mg | ORAL_TABLET | Freq: Every day | ORAL | 3 refills | Status: AC
  Filled 2024-08-27 – 2024-08-29 (×2): qty 90, 90d supply, fill #0

## 2024-08-29 ENCOUNTER — Other Ambulatory Visit (HOSPITAL_COMMUNITY): Payer: Self-pay

## 2024-08-30 ENCOUNTER — Other Ambulatory Visit (HOSPITAL_COMMUNITY): Payer: Self-pay

## 2024-09-05 ENCOUNTER — Other Ambulatory Visit (HOSPITAL_COMMUNITY): Payer: Self-pay

## 2024-09-13 ENCOUNTER — Other Ambulatory Visit (HOSPITAL_BASED_OUTPATIENT_CLINIC_OR_DEPARTMENT_OTHER): Payer: Self-pay | Admitting: Cardiovascular Disease

## 2024-09-13 ENCOUNTER — Other Ambulatory Visit (HOSPITAL_COMMUNITY): Payer: Self-pay

## 2024-09-15 ENCOUNTER — Other Ambulatory Visit (HOSPITAL_COMMUNITY): Payer: Self-pay

## 2024-09-15 ENCOUNTER — Other Ambulatory Visit: Payer: Self-pay

## 2024-09-15 MED ORDER — FLECAINIDE ACETATE 100 MG PO TABS
100.0000 mg | ORAL_TABLET | Freq: Two times a day (BID) | ORAL | 3 refills | Status: AC
  Filled 2024-09-15: qty 180, 90d supply, fill #0

## 2024-09-16 ENCOUNTER — Other Ambulatory Visit: Payer: Self-pay

## 2024-09-18 ENCOUNTER — Encounter: Payer: Self-pay | Admitting: Pharmacist Clinician (PhC)/ Clinical Pharmacy Specialist

## 2024-09-18 ENCOUNTER — Ambulatory Visit: Admitting: Pharmacist

## 2024-09-18 ENCOUNTER — Ambulatory Visit: Attending: Cardiology | Admitting: Pharmacist Clinician (PhC)/ Clinical Pharmacy Specialist

## 2024-09-18 VITALS — BP 134/62 | HR 66

## 2024-09-18 DIAGNOSIS — I10 Essential (primary) hypertension: Secondary | ICD-10-CM

## 2024-09-18 DIAGNOSIS — Z5181 Encounter for therapeutic drug level monitoring: Secondary | ICD-10-CM

## 2024-09-18 DIAGNOSIS — I480 Paroxysmal atrial fibrillation: Secondary | ICD-10-CM | POA: Diagnosis not present

## 2024-09-18 LAB — POCT INR: INR: 2.6 (ref 2.0–3.0)

## 2024-09-18 NOTE — Assessment & Plan Note (Signed)
 Assessment: BP is uncontrolled in office BP 134/62 mmHg; slightly above the goal (<130/80). Home readings averaging higher by about 20 points Tolerates losartan , diltiazem  and metoprolol  well, without any side effects Denies SOB, palpitation, chest pain, headaches,or swelling Reiterated the importance of regular exercise and low salt diet   Plan:  Continue taking current medications Patient to keep record of BP readings with heart rate and report to us  at the next visit Will bring home BP device when comes for next INR check - will validate it at that time.  Patient to follow up with Dr. Raford in June 2026  Labs ordered today:  none

## 2024-09-18 NOTE — Progress Notes (Signed)
 Description   INR-2.6; Continue taking warfarin 5mg  daily except 2.5mg  on Mondays.  Recheck INR in 7 weeks.   Coumadin  Clinic [phone]

## 2024-09-18 NOTE — Patient Instructions (Signed)
 Description   INR-2.6; Continue taking warfarin 5mg  daily except 2.5mg  on Mondays.  Recheck INR in 7 weeks.   Coumadin  Clinic [phone]

## 2024-09-18 NOTE — Patient Instructions (Signed)
 Follow up appointment: on February 17 - bring your home BP device  Take your BP meds as follows: continue with your current medications  Check your blood pressure at home daily and keep record of the readings.  Your blood pressure goal is < 130/80  To check your pressure at home you will need to:  1. Sit up in a chair, with feet flat on the floor and back supported. Do not cross your ankles or legs. 2. Rest your left arm so that the cuff is about heart level. If the cuff goes on your upper arm,  then just relax the arm on the table, arm of the chair or your lap. If you have a wrist cuff, we  suggest relaxing your wrist against your chest (think of it as Pledging the Flag with the  wrong arm).  3. Place the cuff snugly around your arm, about 1 inch above the crook of your elbow. The  cords should be inside the groove of your elbow.  4. Sit quietly, with the cuff in place, for about 5 minutes. After that 5 minutes press the power  button to start a reading. 5. Do not talk or move while the reading is taking place.  6. Record your readings on a sheet of paper. Although most cuffs have a memory, it is often  easier to see a pattern developing when the numbers are all in front of you.  7. You can repeat the reading after 1-3 minutes if it is recommended  Make sure your bladder is empty and you have not had caffeine or tobacco within the last 30 min  Always bring your blood pressure log with you to your appointments. If you have not brought your monitor in to be double checked for accuracy, please bring it to your next appointment.  You can find a list of quality blood pressure cuffs at [email or web address]  Important lifestyle changes to control high blood pressure  Intervention  Effect on the BP  Lose extra pounds and watch your waistline Weight loss is one of the most effective lifestyle changes for controlling blood pressure. If you're overweight or obese, losing even a small amount of  weight can help reduce blood pressure. Blood pressure might go down by about 1 millimeter of mercury (mm Hg) with each kilogram (about 2.2 pounds) of weight lost.  Exercise regularly As a general goal, aim for at least 30 minutes of moderate physical activity every day. Regular physical activity can lower high blood pressure by about 5 to 8 mm Hg.  Eat a healthy diet Eating a diet rich in whole grains, fruits, vegetables, and low-fat dairy products and low in saturated fat and cholesterol. A healthy diet can lower high blood pressure by up to 11 mm Hg.  Reduce salt (sodium) in your diet Even a small reduction of sodium in the diet can improve heart health and reduce high blood pressure by about 5 to 6 mm Hg.  Limit alcohol One drink equals 12 ounces of beer, 5 ounces of wine, or 1.5 ounces of 80-proof liquor.  Limiting alcohol to less than one drink a day for women or two drinks a day for men can help lower blood pressure by about 4 mm Hg.   If you have any questions or concerns please use My Chart to send questions or call the office at [phone]

## 2024-09-18 NOTE — Progress Notes (Signed)
 "  Office Visit    Patient Name: Francis Campbell. Date of Encounter: 09/18/2024  Primary Care Provider:  Micheal Wolm ORN, MD Primary Cardiologist:  Annabella Scarce, MD  Chief Complaint    Hypertension  Significant Past Medical History   AF CHADS2-VASc = 3; on warfarin, flecainide   HLD 10/25 LDL 36; on rosuvastatin  40, ezetimibe  10    Allergies[1]  History of Present Illness    Francis Campbell. is a 79 y.o. male patient of Dr Scarce, in the office today for hypertension evaluation.  He was most recently seen by Dr. Scarce in November, with an office BP reading of 134/68.  Because home readings varied widely, she increased losartan  from 25 to 50 mg daily.  He was asked to continue with home monitoring and return in 2 months with home BP device.    He is in the office today for follow up.  Has been checking home BP readings regularly, but not seen any significant changes since increasing the losartan  to 50 mg daily.  Did not bring home device with him today, but most readings in the 135-150 range systolic and 80-90 diastolic    Blood Pressure Goal:  130/80  Current Medications:  diltiazem  240 mg daily, losartan  50 mg daily, metoprolol  succ 25 mg daily  Previously tried:  lisinopril - cough   Social Hx:      Tobacco:  Alcohol:  Caffeine:tea, Dr. Nunzio   Diet:  mix of home and eating out, out includes fast foods as well as sit down restaurants, some fried foods (usually french fries); at home getting variety of proteins and fresh vegetables; denies snacking    Exercise: none  Home BP readings:  7 day average on phone app is 144/85    Adherence Assessment  Do you ever forget to take your medication? [] Yes [x] No  Do you ever skip doses due to side effects? [] Yes [x] No  Do you have trouble affording your medicines? [] Yes [x] No  Are you ever unable to pick up your medication due to transportation difficulties? [] Yes [x] No    Accessory Clinical Findings     Lab Results  Component Value Date   CREATININE 1.10 07/27/2024   BUN 27 07/27/2024   NA 139 07/27/2024   K 5.1 07/27/2024   CL 103 07/27/2024   CO2 23 07/27/2024   Lab Results  Component Value Date   ALT 100 (H) 07/15/2024   AST 46 (H) 07/15/2024   ALKPHOS 100 07/15/2024   BILITOT 0.5 07/15/2024   No results found for: HGBA1C  Home Medications    Current Outpatient Medications  Medication Sig Dispense Refill   ALPRAZolam  (XANAX ) 0.25 MG tablet Take 1 tablet (0.25 mg total) by mouth at bedtime as needed. 30 tablet 5   Cholecalciferol  (VITAMIN D3) 1000 UNITS CAPS Take 2,000 Units by mouth every morning.     diltiazem  (CARDIZEM  CD) 240 MG 24 hr capsule Take 1 capsule (240 mg total) by mouth in the morning. 90 capsule 3   ezetimibe  (ZETIA ) 10 MG tablet Take 1 tablet (10 mg total) by mouth daily. 90 tablet 3   flecainide  (TAMBOCOR ) 100 MG tablet Take 1 tablet (100 mg total) by mouth 2 (two) times daily. 180 tablet 3   losartan  (COZAAR ) 50 MG tablet Take 1 tablet (50 mg total) by mouth daily. 90 tablet 3   metoprolol  succinate (TOPROL -XL) 25 MG 24 hr tablet Take 1 tablet (25 mg total) by mouth daily with or immediately after  a meal. 90 tablet 3   Multiple Vitamin (MULTIVITAMIN) tablet Take 1 tablet by mouth daily.     potassium citrate  (UROCIT-K ) 10 MEQ (1080 MG) SR tablet Take 1 tablet (10 mEq total) by mouth 3 (three) times daily. Please call to schedule a 6 month follow up. 270 tablet 3   rosuvastatin  (CRESTOR ) 40 MG tablet Take 1 tablet (40 mg total) by mouth daily. 90 tablet 3   sertraline  (ZOLOFT ) 50 MG tablet Take 1 tablet (50 mg total) by mouth daily. Need appointment for refills 90 tablet 0   warfarin (COUMADIN ) 5 MG tablet TAKE 1/2 TO ONE TABLET DAILY AS DIRECTED BY COUMADIN  CLINIC 90 tablet 1   No current facility-administered medications for this visit.       Assessment & Plan    Essential hypertension Assessment: BP is uncontrolled in office BP 134/62 mmHg;  slightly above the goal (<130/80). Home readings averaging higher by about 20 points Tolerates losartan , diltiazem  and metoprolol  well, without any side effects Denies SOB, palpitation, chest pain, headaches,or swelling Reiterated the importance of regular exercise and low salt diet   Plan:  Continue taking current medications Patient to keep record of BP readings with heart rate and report to us  at the next visit Will bring home BP device when comes for next INR check - will validate it at that time.  Patient to follow up with Dr. Raford in June 2026  Labs ordered today:  none   Allean Mink PharmD CPP [facility]  [address] [place], [place] [address] [phone]       [1]  Allergies Allergen Reactions   Chocolate Palpitations   Chocolate Flavoring Agent (Non-Screening) Shortness Of Breath    Other reaction(s): Respiratory Distress   Clemastine     Other reaction(s): Irregular Heart Rate   Cocoa Extract (Fruit) Palpitations   Dextroamphetamine Shortness Of Breath    Other reaction(s): Respiratory Distress   Dextromethorphan Palpitations   Flomax [Tamsulosin Hcl] Other (See Comments)    ? Causes A Fib   Floxin [Ofloxacin] Other (See Comments)    hallucinations   Lisinopril Cough   Other     Tavist causes irregular heart rate   Tamsulosin Other (See Comments)    Other reaction(s): Irregular Heart Rate  Other Reaction(s): other  tamsulosin   Tavist Allergy [Clemastine Fumarate] Other (See Comments)    A fib worse   Dm-Phenylephrine-Acetaminophen      Other reaction(s): Respiratory Distress  Other Reaction(s): other  acetaminophen  / dextromethorphan / phenylephrine   Amiodarone Other (See Comments)    Abn  thyroid    Dextromethorphan-Guaifenesin  Palpitations   Robitussin Cf [Pseudoephedrine-Dm-Gg] Palpitations   "

## 2024-09-22 NOTE — Telephone Encounter (Signed)
 Dr Raford reviewed and patient is low risk for procedure, ok to hold Warfarin  Will forward to Dr Kay and surgical team

## 2024-09-28 ENCOUNTER — Other Ambulatory Visit (HOSPITAL_COMMUNITY): Payer: Self-pay

## 2024-09-28 ENCOUNTER — Other Ambulatory Visit: Payer: Self-pay | Admitting: Family Medicine

## 2024-09-29 ENCOUNTER — Other Ambulatory Visit: Payer: Self-pay

## 2024-09-29 ENCOUNTER — Other Ambulatory Visit (HOSPITAL_COMMUNITY): Payer: Self-pay

## 2024-09-29 MED ORDER — SERTRALINE HCL 50 MG PO TABS
50.0000 mg | ORAL_TABLET | Freq: Every day | ORAL | 2 refills | Status: AC
  Filled 2024-09-29: qty 90, 90d supply, fill #0

## 2024-09-30 ENCOUNTER — Other Ambulatory Visit: Payer: Self-pay

## 2024-09-30 ENCOUNTER — Encounter: Payer: Self-pay | Admitting: Family Medicine

## 2024-09-30 MED ORDER — BENZONATATE 100 MG PO CAPS: ORAL_CAPSULE | ORAL | 0 refills | Status: AC

## 2024-09-30 MED ORDER — BENZONATATE 100 MG PO CAPS
ORAL_CAPSULE | ORAL | 0 refills | Status: DC
  Filled 2024-09-30: qty 30, fill #0

## 2024-09-30 NOTE — Addendum Note (Signed)
 Addended by: METTA KRISTEN CROME on: 09/30/2024 01:26 PM   Modules accepted: Orders

## 2024-10-02 ENCOUNTER — Ambulatory Visit: Admitting: Nurse Practitioner

## 2024-10-11 ENCOUNTER — Other Ambulatory Visit (HOSPITAL_COMMUNITY): Payer: Self-pay

## 2024-10-18 ENCOUNTER — Other Ambulatory Visit (HOSPITAL_COMMUNITY): Payer: Self-pay

## 2024-10-29 ENCOUNTER — Ambulatory Visit: Admitting: Nurse Practitioner

## 2024-10-30 ENCOUNTER — Ambulatory Visit: Admitting: Pharmacist Clinician (PhC)/ Clinical Pharmacy Specialist

## 2024-11-03 ENCOUNTER — Ambulatory Visit

## 2024-11-16 ENCOUNTER — Ambulatory Visit: Admitting: Nurse Practitioner

## 2024-12-04 ENCOUNTER — Ambulatory Visit (HOSPITAL_COMMUNITY): Admit: 2024-12-04 | Admitting: Orthopedic Surgery

## 2024-12-04 SURGERY — ARTHROPLASTY, SHOULDER, TOTAL, REVERSE
Anesthesia: Choice | Site: Shoulder | Laterality: Right

## 2025-01-11 ENCOUNTER — Ambulatory Visit

## 2025-02-22 ENCOUNTER — Other Ambulatory Visit (HOSPITAL_BASED_OUTPATIENT_CLINIC_OR_DEPARTMENT_OTHER)
